# Patient Record
Sex: Female | Born: 1945 | Race: White | Hispanic: No | Marital: Married | Smoking: Never smoker
Health system: Southern US, Community
[De-identification: ages and names within clinical notes are randomized; demographics above are authoritative.]

## PROBLEM LIST (undated history)

## (undated) DIAGNOSIS — M8580 Other specified disorders of bone density and structure, unspecified site: Secondary | ICD-10-CM

## (undated) DIAGNOSIS — C50919 Malignant neoplasm of unspecified site of unspecified female breast: Secondary | ICD-10-CM

## (undated) DIAGNOSIS — M1990 Unspecified osteoarthritis, unspecified site: Secondary | ICD-10-CM

## (undated) DIAGNOSIS — H35341 Macular cyst, hole, or pseudohole, right eye: Secondary | ICD-10-CM

## (undated) DIAGNOSIS — C4491 Basal cell carcinoma of skin, unspecified: Secondary | ICD-10-CM

## (undated) DIAGNOSIS — E119 Type 2 diabetes mellitus without complications: Secondary | ICD-10-CM

## (undated) DIAGNOSIS — G4730 Sleep apnea, unspecified: Secondary | ICD-10-CM

## (undated) DIAGNOSIS — Z923 Personal history of irradiation: Secondary | ICD-10-CM

## (undated) DIAGNOSIS — I10 Essential (primary) hypertension: Secondary | ICD-10-CM

## (undated) DIAGNOSIS — E785 Hyperlipidemia, unspecified: Secondary | ICD-10-CM

## (undated) DIAGNOSIS — G4733 Obstructive sleep apnea (adult) (pediatric): Secondary | ICD-10-CM

## (undated) DIAGNOSIS — T7840XA Allergy, unspecified, initial encounter: Secondary | ICD-10-CM

## (undated) DIAGNOSIS — H269 Unspecified cataract: Secondary | ICD-10-CM

## (undated) DIAGNOSIS — I2510 Atherosclerotic heart disease of native coronary artery without angina pectoris: Secondary | ICD-10-CM

## (undated) HISTORY — DX: Sleep apnea, unspecified: G47.30

## (undated) HISTORY — DX: Essential (primary) hypertension: I10

## (undated) HISTORY — DX: Basal cell carcinoma of skin, unspecified: C44.91

## (undated) HISTORY — PX: COLONOSCOPY: SHX174

## (undated) HISTORY — DX: Hyperlipidemia, unspecified: E78.5

## (undated) HISTORY — PX: MOHS SURGERY: SUR867

## (undated) HISTORY — DX: Obstructive sleep apnea (adult) (pediatric): G47.33

## (undated) HISTORY — PX: SKIN CANCER EXCISION: SHX779

## (undated) HISTORY — DX: Malignant neoplasm of unspecified site of unspecified female breast: C50.919

## (undated) HISTORY — PX: TONSILLECTOMY: SUR1361

## (undated) HISTORY — PX: OTHER SURGICAL HISTORY: SHX169

## (undated) HISTORY — PX: POLYPECTOMY: SHX149

## (undated) HISTORY — DX: Unspecified cataract: H26.9

## (undated) HISTORY — DX: Atherosclerotic heart disease of native coronary artery without angina pectoris: I25.10

## (undated) HISTORY — DX: Allergy, unspecified, initial encounter: T78.40XA

## (undated) HISTORY — DX: Type 2 diabetes mellitus without complications: E11.9

## (undated) HISTORY — DX: Other specified disorders of bone density and structure, unspecified site: M85.80

## (undated) HISTORY — PX: BREAST LUMPECTOMY: SHX2

## (undated) HISTORY — PX: BREAST EXCISIONAL BIOPSY: SUR124

## (undated) HISTORY — DX: Unspecified osteoarthritis, unspecified site: M19.90

---

## 1994-02-21 DIAGNOSIS — C4491 Basal cell carcinoma of skin, unspecified: Secondary | ICD-10-CM

## 1994-02-21 HISTORY — DX: Basal cell carcinoma of skin, unspecified: C44.91

## 1994-08-31 DIAGNOSIS — C4491 Basal cell carcinoma of skin, unspecified: Secondary | ICD-10-CM

## 1994-08-31 HISTORY — DX: Basal cell carcinoma of skin, unspecified: C44.91

## 2003-01-19 ENCOUNTER — Other Ambulatory Visit: Admission: RE | Admit: 2003-01-19 | Discharge: 2003-01-19 | Payer: Self-pay | Admitting: Family Medicine

## 2003-05-04 ENCOUNTER — Inpatient Hospital Stay (HOSPITAL_COMMUNITY): Admission: EM | Admit: 2003-05-04 | Discharge: 2003-05-05 | Payer: Self-pay | Admitting: Emergency Medicine

## 2003-05-04 ENCOUNTER — Encounter: Payer: Self-pay | Admitting: Emergency Medicine

## 2004-03-09 ENCOUNTER — Other Ambulatory Visit: Admission: RE | Admit: 2004-03-09 | Discharge: 2004-03-09 | Payer: Self-pay | Admitting: Family Medicine

## 2004-03-21 ENCOUNTER — Ambulatory Visit (HOSPITAL_COMMUNITY): Admission: RE | Admit: 2004-03-21 | Discharge: 2004-03-21 | Payer: Self-pay | Admitting: Family Medicine

## 2004-12-27 ENCOUNTER — Encounter: Payer: Self-pay | Admitting: Internal Medicine

## 2005-03-19 ENCOUNTER — Other Ambulatory Visit: Admission: RE | Admit: 2005-03-19 | Discharge: 2005-03-19 | Payer: Self-pay | Admitting: Family Medicine

## 2005-03-29 ENCOUNTER — Ambulatory Visit (HOSPITAL_COMMUNITY): Admission: RE | Admit: 2005-03-29 | Discharge: 2005-03-29 | Payer: Self-pay | Admitting: Family Medicine

## 2006-04-29 ENCOUNTER — Ambulatory Visit (HOSPITAL_COMMUNITY): Admission: RE | Admit: 2006-04-29 | Discharge: 2006-04-29 | Payer: Self-pay | Admitting: Family Medicine

## 2006-06-20 ENCOUNTER — Other Ambulatory Visit: Admission: RE | Admit: 2006-06-20 | Discharge: 2006-06-20 | Payer: Self-pay | Admitting: Family Medicine

## 2006-06-20 ENCOUNTER — Encounter: Payer: Self-pay | Admitting: Internal Medicine

## 2006-07-22 ENCOUNTER — Encounter: Payer: Self-pay | Admitting: Internal Medicine

## 2007-05-01 ENCOUNTER — Ambulatory Visit (HOSPITAL_COMMUNITY): Admission: RE | Admit: 2007-05-01 | Discharge: 2007-05-01 | Payer: Self-pay | Admitting: Family Medicine

## 2007-05-05 ENCOUNTER — Encounter: Admission: RE | Admit: 2007-05-05 | Discharge: 2007-05-05 | Payer: Self-pay | Admitting: Family Medicine

## 2007-07-09 ENCOUNTER — Ambulatory Visit: Payer: Self-pay | Admitting: Internal Medicine

## 2007-07-09 DIAGNOSIS — M949 Disorder of cartilage, unspecified: Secondary | ICD-10-CM

## 2007-07-09 DIAGNOSIS — E785 Hyperlipidemia, unspecified: Secondary | ICD-10-CM | POA: Insufficient documentation

## 2007-07-09 DIAGNOSIS — E559 Vitamin D deficiency, unspecified: Secondary | ICD-10-CM | POA: Insufficient documentation

## 2007-07-09 DIAGNOSIS — M899 Disorder of bone, unspecified: Secondary | ICD-10-CM | POA: Insufficient documentation

## 2007-08-18 ENCOUNTER — Encounter: Payer: Self-pay | Admitting: Internal Medicine

## 2007-08-18 ENCOUNTER — Ambulatory Visit: Payer: Self-pay | Admitting: Internal Medicine

## 2007-09-11 DIAGNOSIS — C50919 Malignant neoplasm of unspecified site of unspecified female breast: Secondary | ICD-10-CM

## 2007-09-11 HISTORY — DX: Malignant neoplasm of unspecified site of unspecified female breast: C50.919

## 2007-10-03 ENCOUNTER — Ambulatory Visit: Payer: Self-pay | Admitting: Internal Medicine

## 2007-12-29 ENCOUNTER — Ambulatory Visit: Payer: Self-pay | Admitting: Internal Medicine

## 2007-12-29 LAB — CONVERTED CEMR LAB
Albumin: 3.8 g/dL (ref 3.5–5.2)
Alkaline Phosphatase: 75 units/L (ref 39–117)
BUN: 17 mg/dL (ref 6–23)
Basophils Absolute: 0 10*3/uL (ref 0.0–0.1)
Blood in Urine, dipstick: NEGATIVE
Cholesterol: 112 mg/dL (ref 0–200)
Eosinophils Absolute: 0.1 10*3/uL (ref 0.0–0.7)
Eosinophils Relative: 1.9 % (ref 0.0–5.0)
GFR calc Af Amer: 109 mL/min
GFR calc non Af Amer: 90 mL/min
Glucose, Urine, Semiquant: NEGATIVE
HCT: 39.4 % (ref 36.0–46.0)
HDL: 27 mg/dL — ABNORMAL LOW (ref >39.0)
Ketones, urine, test strip: NEGATIVE
MCHC: 33.8 g/dL (ref 30.0–36.0)
MCV: 93.9 fL (ref 78.0–100.0)
Monocytes Absolute: 0.6 10*3/uL (ref 0.1–1.0)
Platelets: 202 10*3/uL (ref 150–400)
Potassium: 3.8 meq/L (ref 3.5–5.1)
Protein, U semiquant: NEGATIVE
RDW: 11.5 % (ref 11.5–14.6)
Specific Gravity, Urine: 1.02
TSH: 1.27 microintl units/mL (ref 0.35–5.50)
Total Bilirubin: 0.5 mg/dL (ref 0.3–1.2)
Triglycerides: 82 mg/dL (ref 0–149)
VLDL: 16 mg/dL (ref 0–40)
WBC: 5.6 10*3/uL (ref 4.5–10.5)
pH: 7

## 2008-01-05 ENCOUNTER — Encounter: Payer: Self-pay | Admitting: Internal Medicine

## 2008-01-05 ENCOUNTER — Other Ambulatory Visit: Admission: RE | Admit: 2008-01-05 | Discharge: 2008-01-05 | Payer: Self-pay | Admitting: Internal Medicine

## 2008-01-05 ENCOUNTER — Ambulatory Visit: Payer: Self-pay | Admitting: Internal Medicine

## 2008-01-28 ENCOUNTER — Ambulatory Visit: Payer: Self-pay | Admitting: Gastroenterology

## 2008-02-13 ENCOUNTER — Encounter: Payer: Self-pay | Admitting: Gastroenterology

## 2008-02-13 ENCOUNTER — Ambulatory Visit: Payer: Self-pay | Admitting: Gastroenterology

## 2008-02-16 ENCOUNTER — Encounter: Payer: Self-pay | Admitting: Gastroenterology

## 2008-03-01 ENCOUNTER — Telehealth: Payer: Self-pay | Admitting: Gastroenterology

## 2008-03-01 ENCOUNTER — Ambulatory Visit: Payer: Self-pay | Admitting: Internal Medicine

## 2008-03-03 ENCOUNTER — Ambulatory Visit: Payer: Self-pay | Admitting: Gastroenterology

## 2008-03-04 ENCOUNTER — Telehealth: Payer: Self-pay | Admitting: Gastroenterology

## 2008-03-05 ENCOUNTER — Encounter: Payer: Self-pay | Admitting: Gastroenterology

## 2008-03-10 HISTORY — PX: MASS EXCISION: SHX2000

## 2008-03-17 ENCOUNTER — Encounter: Payer: Self-pay | Admitting: Gastroenterology

## 2008-03-22 ENCOUNTER — Encounter: Payer: Self-pay | Admitting: Internal Medicine

## 2008-03-22 ENCOUNTER — Ambulatory Visit (HOSPITAL_COMMUNITY): Admission: RE | Admit: 2008-03-22 | Discharge: 2008-03-24 | Payer: Self-pay | Admitting: General Surgery

## 2008-03-22 ENCOUNTER — Encounter (HOSPITAL_BASED_OUTPATIENT_CLINIC_OR_DEPARTMENT_OTHER): Payer: Self-pay | Admitting: General Surgery

## 2008-04-12 ENCOUNTER — Ambulatory Visit: Payer: Self-pay | Admitting: Internal Medicine

## 2008-04-12 LAB — CONVERTED CEMR LAB
ALT: 28 units/L (ref 0–35)
AST: 20 units/L (ref 0–37)
Albumin: 4 g/dL (ref 3.5–5.2)
HDL: 29.3 mg/dL — ABNORMAL LOW (ref >39.0)
Total CHOL/HDL Ratio: 5.3
Triglycerides: 104 mg/dL (ref 0–149)

## 2008-05-11 ENCOUNTER — Ambulatory Visit (HOSPITAL_COMMUNITY): Admission: RE | Admit: 2008-05-11 | Discharge: 2008-05-11 | Payer: Self-pay | Admitting: Internal Medicine

## 2008-05-18 ENCOUNTER — Encounter: Admission: RE | Admit: 2008-05-18 | Discharge: 2008-05-18 | Payer: Self-pay | Admitting: Internal Medicine

## 2008-06-10 ENCOUNTER — Encounter (INDEPENDENT_AMBULATORY_CARE_PROVIDER_SITE_OTHER): Payer: Self-pay | Admitting: Diagnostic Radiology

## 2008-06-10 ENCOUNTER — Encounter: Payer: Self-pay | Admitting: Internal Medicine

## 2008-06-10 ENCOUNTER — Encounter: Admission: RE | Admit: 2008-06-10 | Discharge: 2008-06-10 | Payer: Self-pay | Admitting: Internal Medicine

## 2008-06-10 HISTORY — PX: BREAST LUMPECTOMY: SHX2

## 2008-06-15 ENCOUNTER — Telehealth: Payer: Self-pay | Admitting: Internal Medicine

## 2008-06-21 ENCOUNTER — Encounter: Payer: Self-pay | Admitting: Gastroenterology

## 2008-06-21 ENCOUNTER — Encounter: Admission: RE | Admit: 2008-06-21 | Discharge: 2008-06-21 | Payer: Self-pay | Admitting: Internal Medicine

## 2008-07-06 ENCOUNTER — Encounter: Admission: RE | Admit: 2008-07-06 | Discharge: 2008-07-06 | Payer: Self-pay | Admitting: General Surgery

## 2008-07-06 ENCOUNTER — Encounter (INDEPENDENT_AMBULATORY_CARE_PROVIDER_SITE_OTHER): Payer: Self-pay | Admitting: General Surgery

## 2008-07-06 ENCOUNTER — Ambulatory Visit (HOSPITAL_BASED_OUTPATIENT_CLINIC_OR_DEPARTMENT_OTHER): Admission: RE | Admit: 2008-07-06 | Discharge: 2008-07-06 | Payer: Self-pay | Admitting: General Surgery

## 2008-07-06 ENCOUNTER — Encounter: Payer: Self-pay | Admitting: Internal Medicine

## 2008-07-07 ENCOUNTER — Encounter (INDEPENDENT_AMBULATORY_CARE_PROVIDER_SITE_OTHER): Payer: Self-pay | Admitting: General Surgery

## 2008-07-07 ENCOUNTER — Encounter: Payer: Self-pay | Admitting: Internal Medicine

## 2008-07-08 ENCOUNTER — Telehealth: Payer: Self-pay | Admitting: Internal Medicine

## 2008-07-13 ENCOUNTER — Ambulatory Visit: Payer: Self-pay | Admitting: Internal Medicine

## 2008-07-13 DIAGNOSIS — I10 Essential (primary) hypertension: Secondary | ICD-10-CM | POA: Insufficient documentation

## 2008-07-28 ENCOUNTER — Encounter: Payer: Self-pay | Admitting: Internal Medicine

## 2008-07-31 ENCOUNTER — Encounter: Payer: Self-pay | Admitting: Internal Medicine

## 2008-08-09 ENCOUNTER — Encounter: Payer: Self-pay | Admitting: Internal Medicine

## 2008-08-11 ENCOUNTER — Ambulatory Visit: Payer: Self-pay | Admitting: Internal Medicine

## 2008-08-25 ENCOUNTER — Encounter: Payer: Self-pay | Admitting: Internal Medicine

## 2008-09-06 ENCOUNTER — Ambulatory Visit: Admission: RE | Admit: 2008-09-06 | Discharge: 2008-11-04 | Payer: Self-pay | Admitting: Radiation Oncology

## 2008-09-21 ENCOUNTER — Encounter: Payer: Self-pay | Admitting: Internal Medicine

## 2008-10-01 ENCOUNTER — Telehealth: Payer: Self-pay | Admitting: Internal Medicine

## 2008-11-05 ENCOUNTER — Encounter: Payer: Self-pay | Admitting: Internal Medicine

## 2008-11-17 ENCOUNTER — Ambulatory Visit: Payer: Self-pay | Admitting: Internal Medicine

## 2008-12-03 ENCOUNTER — Encounter: Payer: Self-pay | Admitting: Internal Medicine

## 2008-12-09 ENCOUNTER — Encounter: Payer: Self-pay | Admitting: Internal Medicine

## 2008-12-13 ENCOUNTER — Ambulatory Visit: Payer: Self-pay | Admitting: Internal Medicine

## 2008-12-28 ENCOUNTER — Encounter: Payer: Self-pay | Admitting: Internal Medicine

## 2008-12-29 ENCOUNTER — Ambulatory Visit: Payer: Self-pay | Admitting: Internal Medicine

## 2009-01-12 ENCOUNTER — Ambulatory Visit: Payer: Self-pay | Admitting: Internal Medicine

## 2009-01-13 LAB — CONVERTED CEMR LAB
ALT: 20 units/L (ref 0–35)
Alkaline Phosphatase: 46 units/L (ref 39–117)
BUN: 15 mg/dL (ref 6–23)
Bilirubin, Direct: 0.1 mg/dL (ref 0.0–0.3)
Calcium: 9.1 mg/dL (ref 8.4–10.5)
Cholesterol: 147 mg/dL (ref 0–200)
Creatinine, Ser: 0.7 mg/dL (ref 0.4–1.2)
GFR calc non Af Amer: 90.1 mL/min (ref >60)
Total Protein: 6.5 g/dL (ref 6.0–8.3)

## 2009-02-10 ENCOUNTER — Encounter: Payer: Self-pay | Admitting: Internal Medicine

## 2009-03-28 ENCOUNTER — Ambulatory Visit: Payer: Self-pay | Admitting: Internal Medicine

## 2009-03-28 LAB — CONVERTED CEMR LAB
AST: 23 units/L (ref 0–37)
Albumin: 3.7 g/dL (ref 3.5–5.2)
BUN: 16 mg/dL (ref 6–23)
Basophils Absolute: 0 10*3/uL (ref 0.0–0.1)
Bilirubin Urine: NEGATIVE
CO2: 31 meq/L (ref 19–32)
Chloride: 108 meq/L (ref 96–112)
Cholesterol: 116 mg/dL (ref 0–200)
Eosinophils Absolute: 0.2 10*3/uL (ref 0.0–0.7)
GFR calc non Af Amer: 107.57 mL/min (ref >60)
Glucose, Bld: 93 mg/dL (ref 70–99)
Glucose, Urine, Semiquant: NEGATIVE
HCT: 39.9 % (ref 36.0–46.0)
Hemoglobin: 14 g/dL (ref 12.0–15.0)
Lymphs Abs: 1.5 10*3/uL (ref 0.7–4.0)
MCHC: 35 g/dL (ref 30.0–36.0)
MCV: 94.2 fL (ref 78.0–100.0)
Monocytes Absolute: 0.6 10*3/uL (ref 0.1–1.0)
Monocytes Relative: 10.2 % (ref 3.0–12.0)
Neutro Abs: 3.2 10*3/uL (ref 1.4–7.7)
Platelets: 188 10*3/uL (ref 150.0–400.0)
Potassium: 4.7 meq/L (ref 3.5–5.1)
RDW: 11.3 % — ABNORMAL LOW (ref 11.5–14.6)
Sodium: 143 meq/L (ref 135–145)
Specific Gravity, Urine: 1.015
TSH: 1.1 microintl units/mL (ref 0.35–5.50)
Total Bilirubin: 0.7 mg/dL (ref 0.3–1.2)
VLDL: 18.2 mg/dL (ref 0.0–40.0)
pH: 7.5

## 2009-04-05 ENCOUNTER — Encounter: Payer: Self-pay | Admitting: Internal Medicine

## 2009-04-05 ENCOUNTER — Other Ambulatory Visit: Admission: RE | Admit: 2009-04-05 | Discharge: 2009-04-05 | Payer: Self-pay | Admitting: Internal Medicine

## 2009-04-05 ENCOUNTER — Ambulatory Visit: Payer: Self-pay | Admitting: Internal Medicine

## 2009-05-12 ENCOUNTER — Encounter: Admission: RE | Admit: 2009-05-12 | Discharge: 2009-05-12 | Payer: Self-pay | Admitting: Hematology and Oncology

## 2009-09-14 ENCOUNTER — Ambulatory Visit: Payer: Self-pay | Admitting: Internal Medicine

## 2009-09-14 LAB — CONVERTED CEMR LAB
AST: 24 units/L (ref 0–37)
Albumin: 3.7 g/dL (ref 3.5–5.2)
Alkaline Phosphatase: 41 units/L (ref 39–117)
BUN: 13 mg/dL (ref 6–23)
CO2: 30 meq/L (ref 19–32)
Calcium: 9.4 mg/dL (ref 8.4–10.5)
Cholesterol: 157 mg/dL (ref 0–200)
Glucose, Bld: 90 mg/dL (ref 70–99)
HDL: 32.1 mg/dL — ABNORMAL LOW (ref >39.00)
Sodium: 142 meq/L (ref 135–145)
Total Protein: 6.6 g/dL (ref 6.0–8.3)
VLDL: 23.8 mg/dL (ref 0.0–40.0)

## 2009-10-03 ENCOUNTER — Other Ambulatory Visit: Admission: RE | Admit: 2009-10-03 | Discharge: 2009-10-03 | Payer: Self-pay | Admitting: Internal Medicine

## 2009-10-03 ENCOUNTER — Ambulatory Visit: Payer: Self-pay | Admitting: Internal Medicine

## 2009-10-03 DIAGNOSIS — R87619 Unspecified abnormal cytological findings in specimens from cervix uteri: Secondary | ICD-10-CM | POA: Insufficient documentation

## 2009-10-03 LAB — HM PAP SMEAR

## 2009-10-06 LAB — CONVERTED CEMR LAB: Pap Smear: NEGATIVE

## 2009-10-12 ENCOUNTER — Ambulatory Visit: Payer: Self-pay | Admitting: Internal Medicine

## 2009-10-12 ENCOUNTER — Encounter: Payer: Self-pay | Admitting: Internal Medicine

## 2009-10-25 IMAGING — MG MM DIGITAL SCREENING BILAT
4 series · 4 of 4 positions shown · non-contrast
Comparison: none

DG SCREEN MAMMOGRAM BILATERAL
Bilateral CC and MLO view(s) were taken.
Technologist: Jidung, Jassi.(JALIA)(M)

DIGITAL SCREENING MAMMOGRAM WITH CAD:
The breast tissue is heterogeneously dense.  A possible mass is noted in the left breast.  Spot 
compression views and possibly sonography are recommended for further evaluation.  In the right 
breast, no masses or malignant type calcifications are identified.  Compared with prior studies.

[R CC]
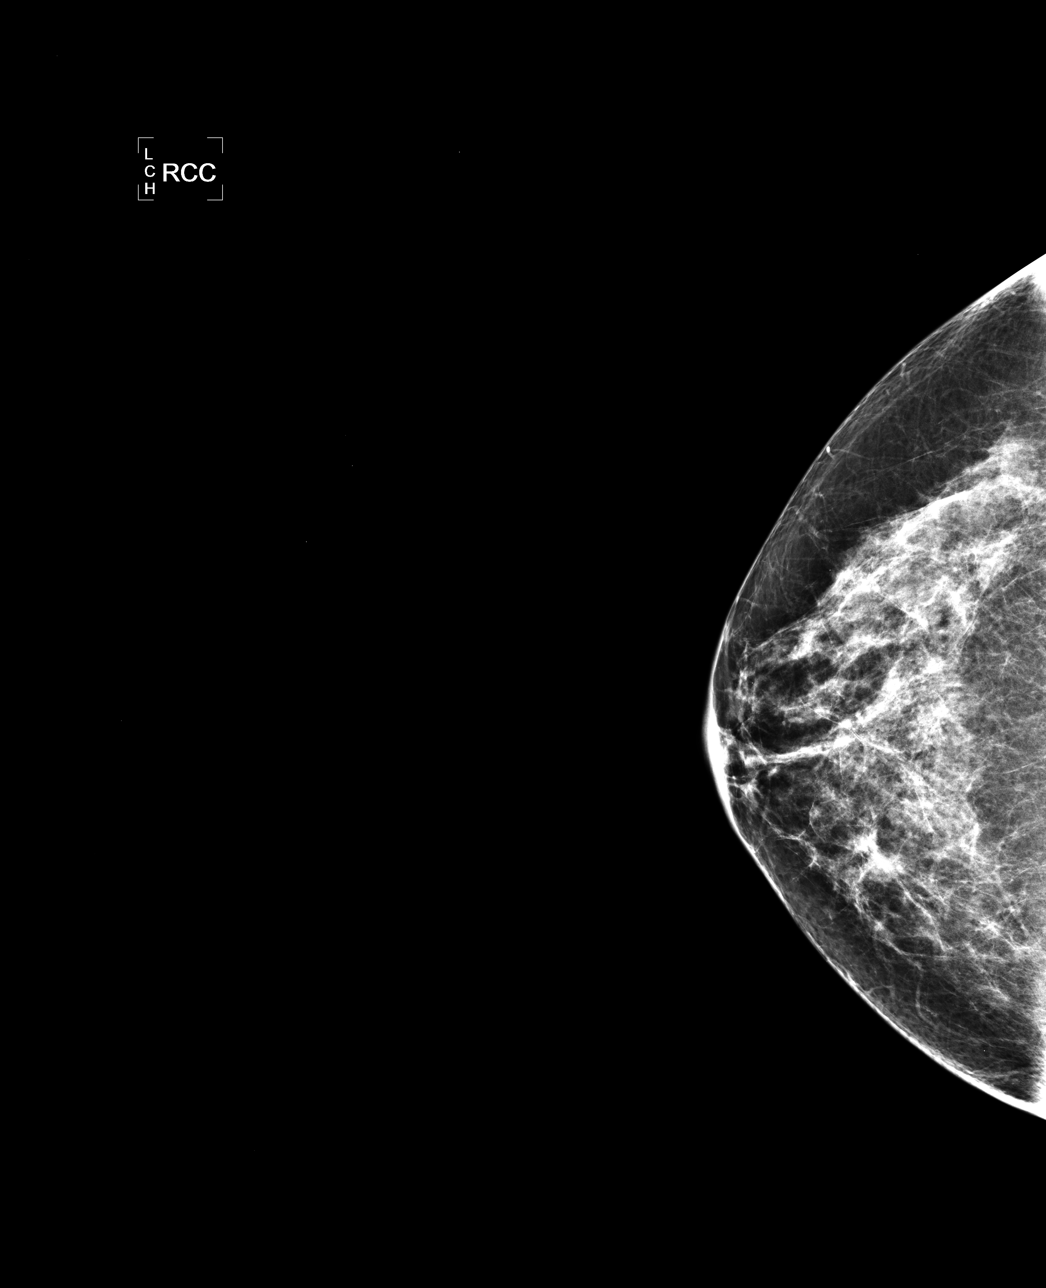

[R MLO]
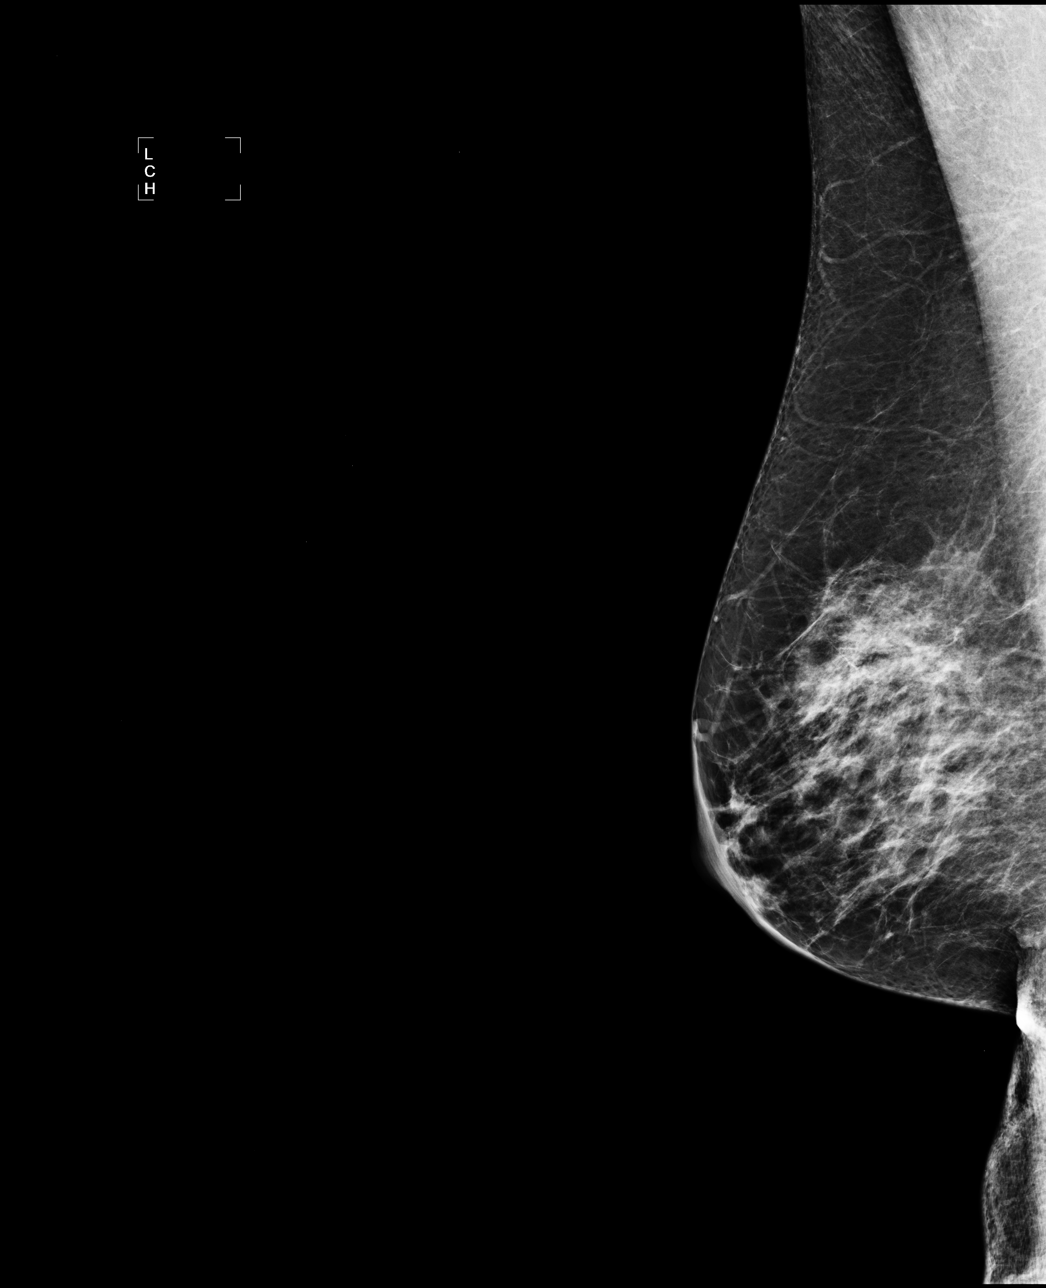

[L CC]
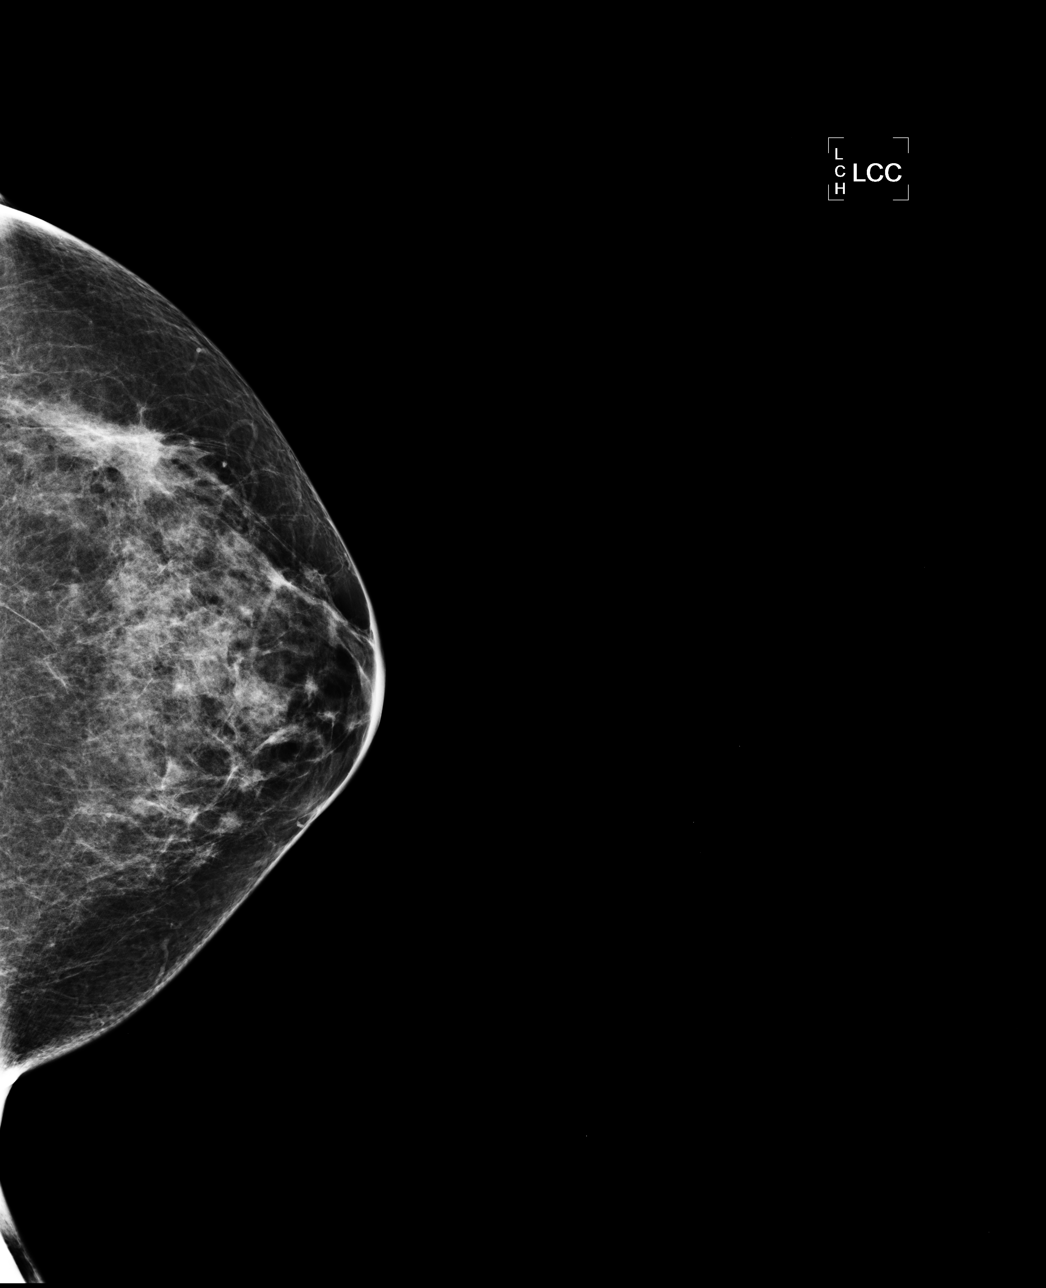

[L MLO]
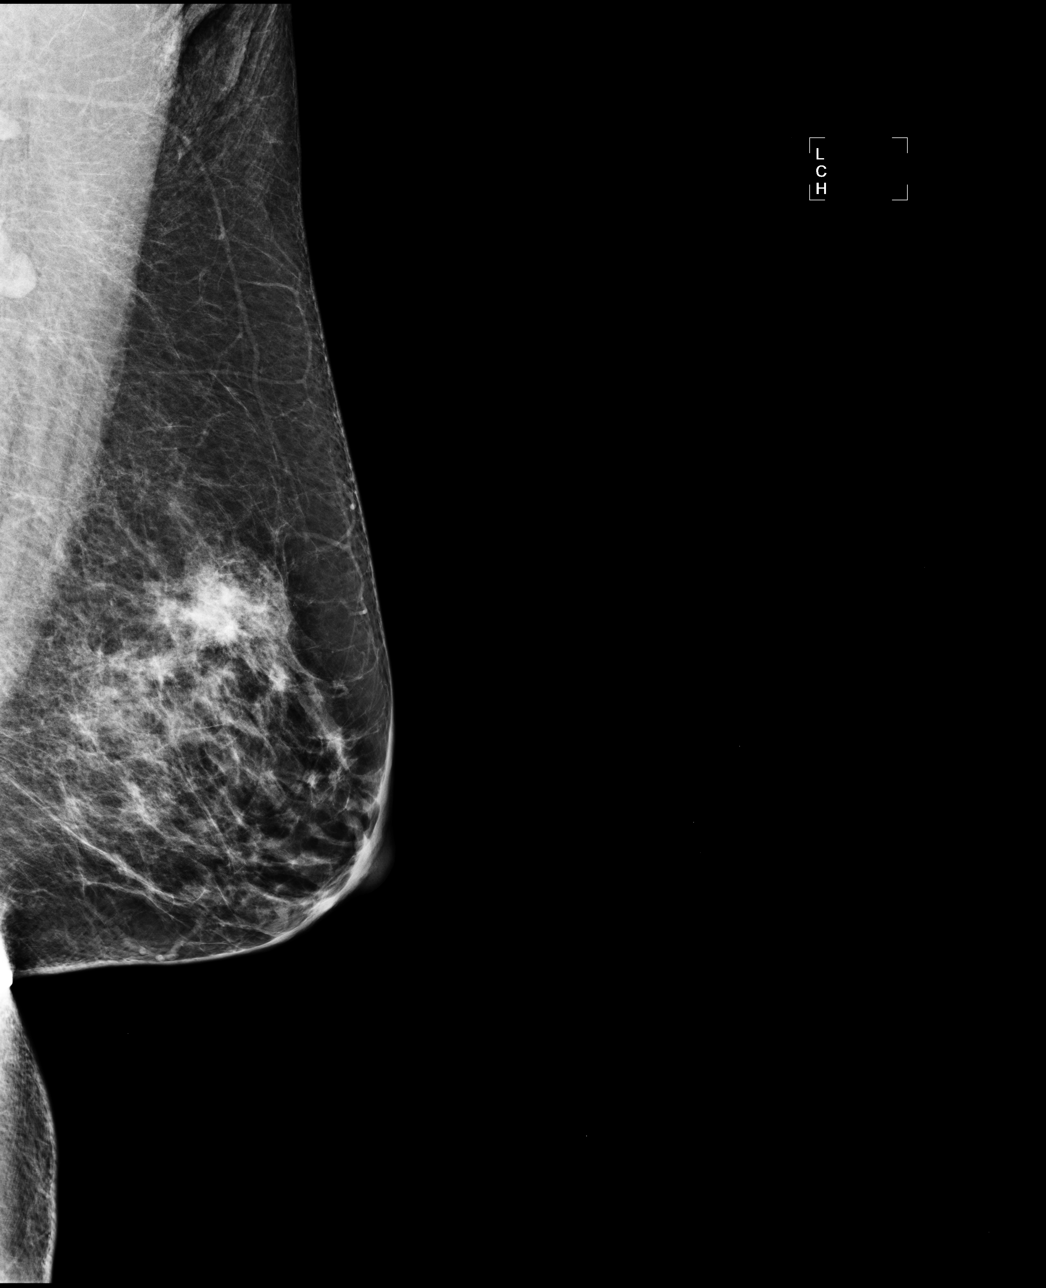

[4 of 4 positions shown; findings below may reference images not displayed]

IMPRESSION: Possible mass, left breast.  Additional evaluation is indicated.  The patient will be contacted for
additional studies and a supplementary report will follow.  No specific mammographic evidence of 
malignancy, right breast.

ASSESSMENT: Need additional imaging evaluation and/or prior mammograms for comparison - BI-RADS 0

Further imaging of the left breast.
ANALYZED BY COMPUTER AIDED DETECTION. , THIS PROCEDURE WAS A DIGITAL MAMMOGRAM.

## 2009-11-01 IMAGING — US UNKNOWN PR STUDY
1 series · 5 of 5 positions shown · non-contrast
Comparison: Prior mammograms

CLINICAL DATA: Possible mass left breast seen on recent screening
mammogram

DIGITAL DIAGNOSTIC  LEFT  MAMMOGRAM  WITHOUT CAD AND LEFT BREAST
ULTRASOUND:

[Series 1: unknown pr study · 5 of 5 slices shown]
[im 1/5]
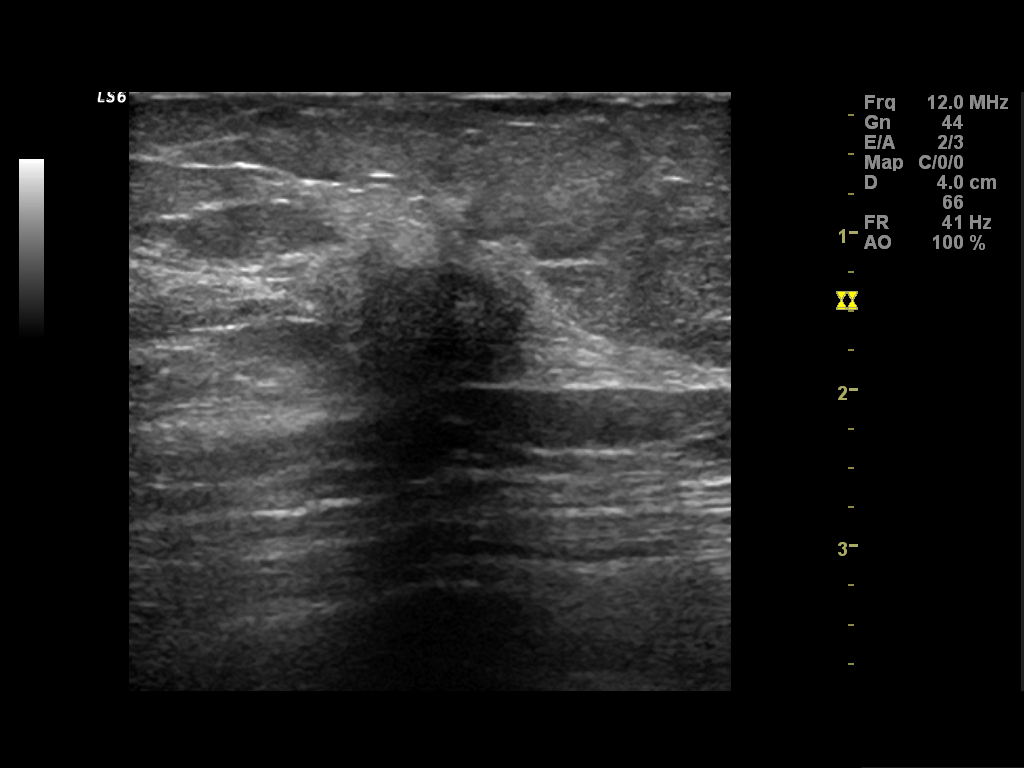
[im 2/5]
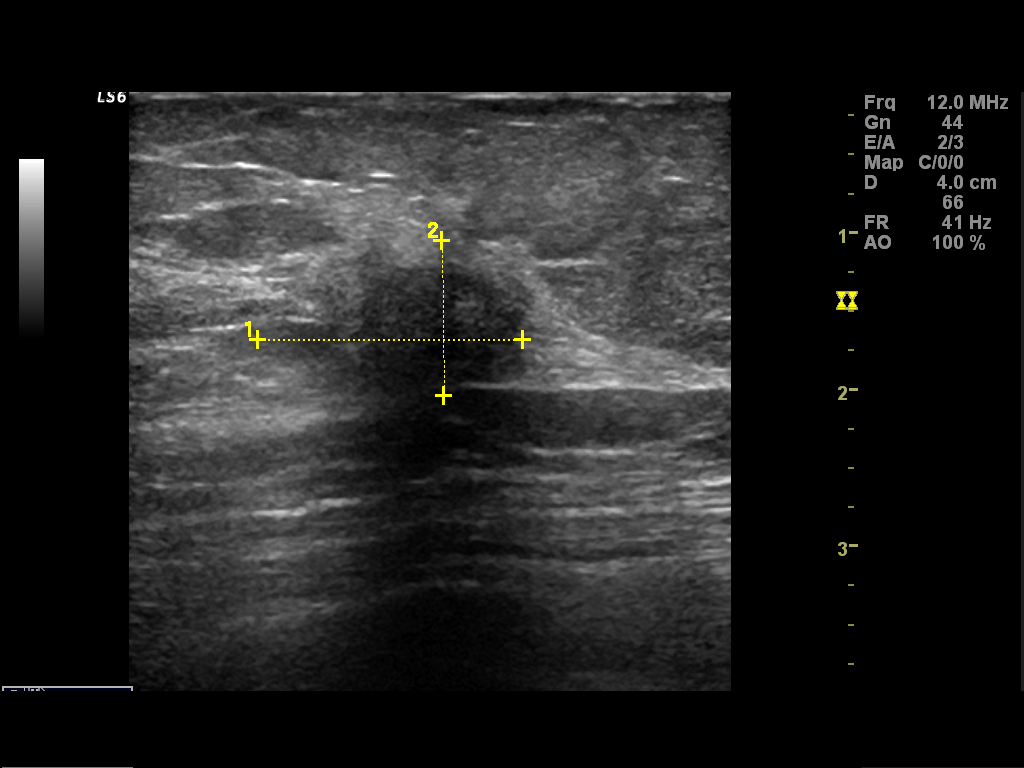
[im 3/5]
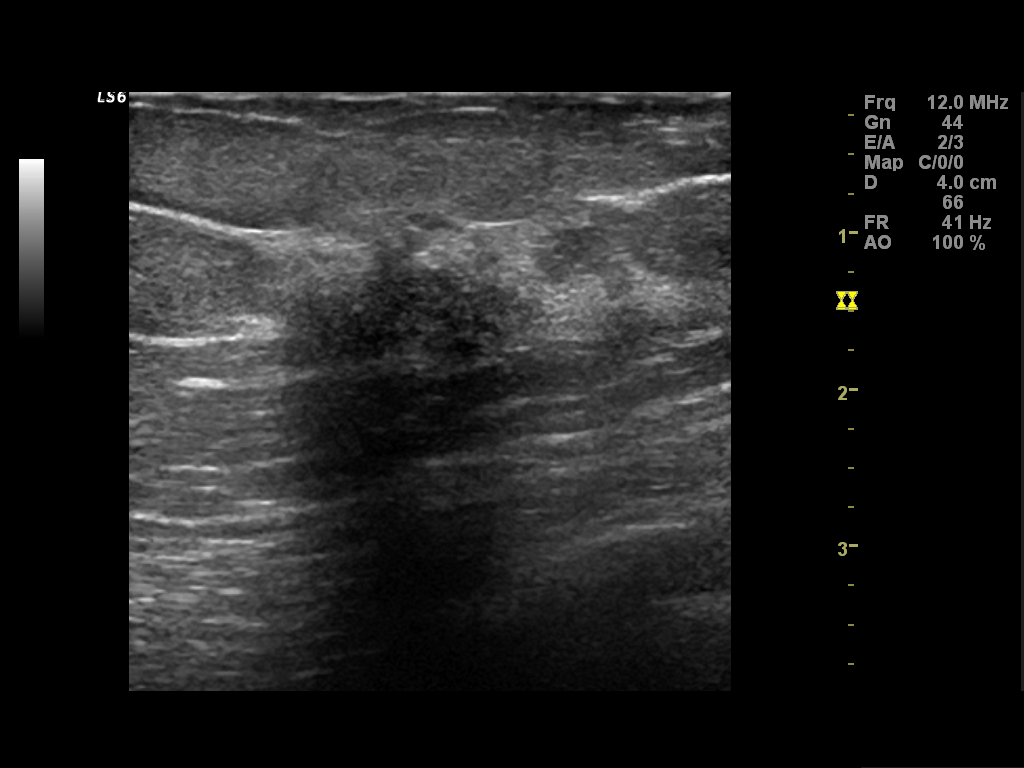
[im 4/5]
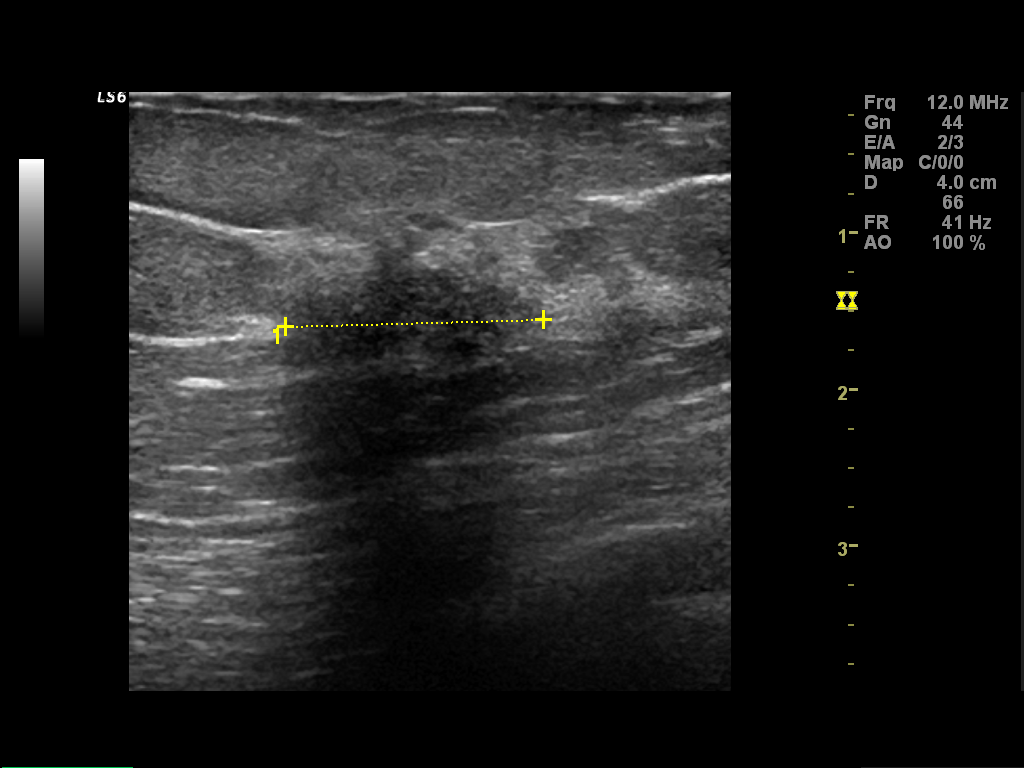
[im 5/5]
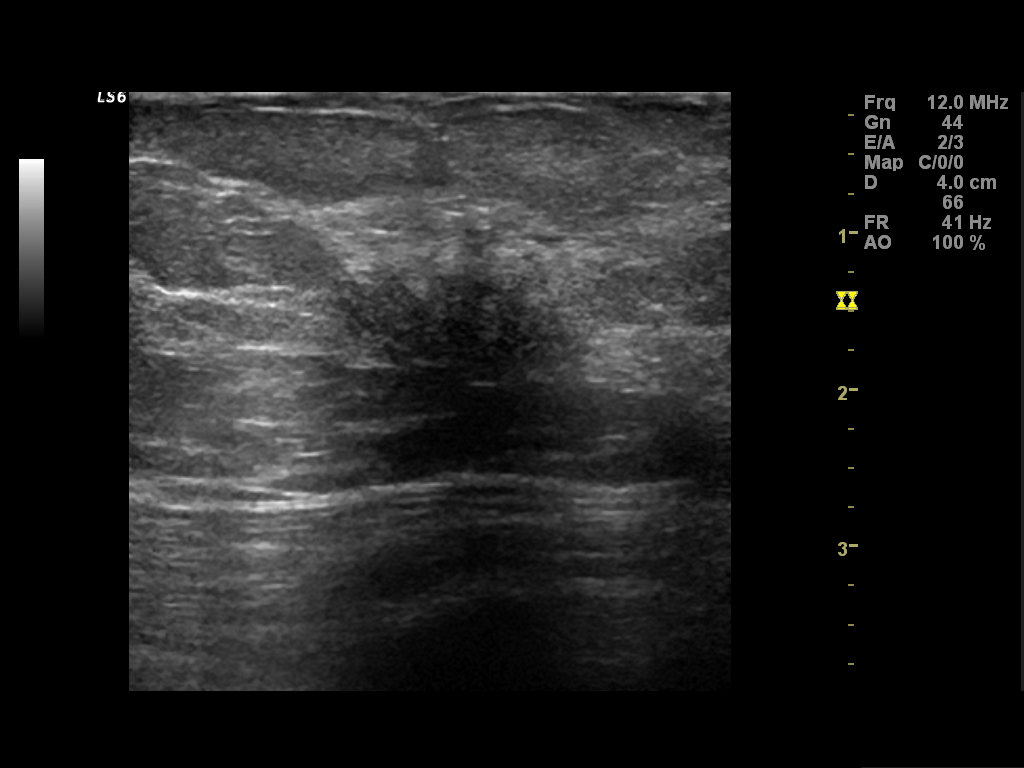

[5 of 5 positions shown; findings below may reference images not displayed]

FINDINGS: Focal spot compression views of the upper outer left
breast show heterogeneously dense breast parenchyma.  There is an
area of added density and architectural distortion in the deep
aspect of the upper outer quadrant.

On physical exam, I do palpate a firm area in the left breast at
[DATE] position 3 cm from the nipple.

Ultrasound is performed, showing an irregular hypoechoic 1.7 x
by 1.6 cm area with ill-defined margins and posterior acoustic
shadowing in the left breast at [DATE] position 3 cm from the nipple.
Ultrasound of the left axilla shows no evidence of adenopathy.
IMPRESSION: Ill-defined hypoechoic area with posterior acoustic shadowing in
the left breast at [DATE] position 3 cm from the nipple.  This does
correspond to a palpable area of firmness on physical examination
Tissue sampling for pathologic correlation is suggested.  The
patient would like to schedule ultrasound-guided core needle biopsy
after she returns from a planned trip.  This has been scheduled for
June 10, 2008 at 10 o'clock.

BI-RADS CATEGORY 4:  Suspicious abnormality - biopsy should be
considered.

## 2009-11-07 ENCOUNTER — Encounter: Payer: Self-pay | Admitting: Internal Medicine

## 2009-11-15 ENCOUNTER — Telehealth: Payer: Self-pay | Admitting: Internal Medicine

## 2010-02-21 ENCOUNTER — Encounter: Payer: Self-pay | Admitting: Internal Medicine

## 2010-04-12 ENCOUNTER — Ambulatory Visit: Payer: Self-pay | Admitting: Internal Medicine

## 2010-04-12 LAB — CONVERTED CEMR LAB
ALT: 21 units/L (ref 0–35)
AST: 22 units/L (ref 0–37)
Alkaline Phosphatase: 50 units/L (ref 39–117)
BUN: 20 mg/dL (ref 6–23)
CO2: 28 meq/L (ref 19–32)
Calcium: 8.6 mg/dL (ref 8.4–10.5)
Creatinine, Ser: 0.6 mg/dL (ref 0.4–1.2)
Glucose, Bld: 92 mg/dL (ref 70–99)
Total Bilirubin: 0.5 mg/dL (ref 0.3–1.2)
Total CHOL/HDL Ratio: 4
Triglycerides: 115 mg/dL (ref 0.0–149.0)

## 2010-04-19 ENCOUNTER — Ambulatory Visit: Payer: Self-pay | Admitting: Internal Medicine

## 2010-05-17 ENCOUNTER — Encounter: Admission: RE | Admit: 2010-05-17 | Discharge: 2010-05-17 | Payer: Self-pay | Admitting: Hematology and Oncology

## 2010-05-17 LAB — HM MAMMOGRAPHY

## 2010-06-02 ENCOUNTER — Encounter: Payer: Self-pay | Admitting: Internal Medicine

## 2010-08-24 ENCOUNTER — Encounter: Payer: Self-pay | Admitting: Internal Medicine

## 2010-08-25 ENCOUNTER — Telehealth: Payer: Self-pay | Admitting: Internal Medicine

## 2010-09-08 ENCOUNTER — Encounter
Admission: RE | Admit: 2010-09-08 | Discharge: 2010-09-08 | Payer: Self-pay | Source: Home / Self Care | Attending: General Surgery | Admitting: General Surgery

## 2010-09-10 HISTORY — PX: CATARACT EXTRACTION: SUR2

## 2010-09-26 ENCOUNTER — Ambulatory Visit
Admission: RE | Admit: 2010-09-26 | Discharge: 2010-09-26 | Payer: Self-pay | Source: Home / Self Care | Attending: Internal Medicine | Admitting: Internal Medicine

## 2010-10-01 ENCOUNTER — Encounter: Payer: Self-pay | Admitting: Family Medicine

## 2010-10-10 ENCOUNTER — Encounter: Payer: Self-pay | Admitting: Internal Medicine

## 2010-10-10 NOTE — Letter (Signed)
Summary: [facility]   Imported By: Maryln Gottron 03/31/2010 11:11:41  _____________________________________________________________________  External Attachment:    Type:   Image     Comment:   External Document

## 2010-10-10 NOTE — Assessment & Plan Note (Signed)
Summary: 6 month rov/et pt rsc/njr   Vital Signs:  Patient profile:   65 year old female Menstrual status:  postmenopausal Weight:      158 pounds BMI:     25.59 Temp:     98.6 degrees F oral Pulse rate:   74 / minute Pulse rhythm:   regular Resp:     12 per minute BP sitting:   114 / 76  (left arm) Cuff size:   regular  Vitals Entered By: Gladis Riffle, RN (April 19, 2010 9:50 AM) CC: 6 month rov, labs done Is Patient Diabetic? No   Primary Care Miche Loughridge:  [name], MD  CC:  6 month rov and labs done.  History of Present Illness:  Follow-Up Visit      This is a 65 year old woman who presents for Follow-up visit.  The patient denies chest pain and palpitations.  Since the last visit the patient notes no new problems or concerns.  The patient reports taking meds as prescribed.  When questioned about possible medication side effects, the patient notes none.    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 Take 1 Tablet By Mouth At Bedtime As Directed 2)  Lisinopril 20 Mg Tabs (Lisinopril) .Marland Kitchen.. 1 Tablet By Mouth Daily 3)  Alprazolam 0.5 Mg  Tabs (Alprazolam) .... Take One Tablet By Mouth Every Other Day As Needed Anxiety 4)  Tamoxifen Citrate 20 Mg  Tabs (Tamoxifen Citrate) .... Take 1 Tablet By Mouth Once A Day 5)  Aspirin 81 Mg Tbec (Aspirin) .... Once Daily 6)  Multivitamins  Tabs (Multiple Vitamin) .... Once Daily 7)  Omega-3 350 Mg Caps (Omega-3 Fatty Acids) ....  One  Daily 8)  Vitamin C 500 Mg Tabs (Ascorbic Acid) .... Once Daily 9)  Vitamin D3 1000 Unit Tabs (Cholecalciferol) .... Every Day  Allergies (verified): No Known Drug Allergies  Past History:  Past Medical History: Last updated: 07/13/2008 Hyperlipidemia BCC- Basil cell carcinoma breast biopsy 1998 Osteopenia Breast CA 2009  Past Surgical History: Last updated: 07/09/2007 BCC-equired excision breast biopsy- 1998 Tonsillectomy  Family History: Last updated: 07/13/2008 father-heart disease MI 61yo Mother MI at age 86 Family History Hypertension-both parents Sister-suicide age 45 yo brother--no contact  Social History: Last updated: 03/03/2008 Retired-works one day weekly Married 1 out of 3 children-decased congenital heart (transposition of the great vessels) Never Smoked Alcohol use-yes- socially  Risk Factors: Alcohol Use: <1 (04/19/2010)  Risk Factors: Smoking Status: never (04/19/2010)  Physical Exam  General:  alert and well-developed.   Head:  normocephalic and atraumatic.   Eyes:  pupils equal and pupils round.   Ears:  R ear normal and L ear normal.   Neck:  No deformities, masses, or tenderness noted. Chest Wall:  No deformities, masses, or tenderness noted. Lungs:  normal respiratory effort and no intercostal retractions.   Heart:  normal rate and regular rhythm.   Abdomen:  Bowel sounds positive,abdomen soft and non-tender without masses, organomegaly or hernias noted. Msk:  normal ROM and no joint tenderness.   Neurologic:  cranial nerves II-XII intact and gait normal.   Psych:  good eye contact and not anxious appearing.     Impression & Recommendations:  Problem # 1:  HYPERTENSION (ICD-401.9) controlled continue current medications  Her updated medication list for this problem includes:    Lisinopril 20 Mg Tabs (Lisinopril) .Marland Kitchen... 1 tablet by mouth daily  BP today: 114/76 Prior BP: 150/98 (10/03/2009)  Labs Reviewed: K+: 4.0  (04/12/2010) Creat: : 0.6 (04/12/2010)   Chol: 149 (04/12/2010)   HDL: 33.80 (04/12/2010)   LDL: 92 (04/12/2010)   TG: 115.0 (04/12/2010)  Problem # 2:  HYPERLIPIDEMIA (ICD-272.4) controlled continue current medications  increase fish oil to 3 daily Her updated medication list for this problem includes:    Simvastatin 40 Mg Tabs (Simvastatin) .Marland Kitchen... Take 1 tablet by mouth at bedtime as directed  Labs Reviewed: SGOT: 22 (04/12/2010)   SGPT: 21 (04/12/2010)   HDL:33.80 (04/12/2010), 32.10 (09/14/2009)  LDL:92 (04/12/2010), 101 (16/06/9603)  Chol:149 (04/12/2010), 157 (09/14/2009)  Trig:115.0 (04/12/2010), 119.0 (09/14/2009)  Problem # 3:  ADENOCARCINOMA, BREAST (ICD-174.9) has f/u with oncology and surgery (total of 4/year)  Complete Medication List: 1)  Simvastatin 40 Mg Tabs (Simvastatin) .... Take 1 tablet by mouth at bedtime as directed 2)  Lisinopril 20 Mg Tabs (Lisinopril) .Marland Kitchen.. 1 tablet by mouth daily 3)  Alprazolam 0.5 Mg Tabs (Alprazolam) .... Take one tablet by mouth every other day as needed anxiety 4)  Tamoxifen Citrate 20 Mg Tabs (Tamoxifen citrate) .... Take 1 tablet by mouth once a day 5)  Aspirin 81 Mg Tbec (Aspirin) .... Once daily 6)  Multivitamins Tabs (Multiple vitamin) .... Once daily 7)  Omega-3 350 Mg Caps (Omega-3 fatty acids) .... 3 by mouth once daily 8)  Vitamin C 500 Mg Tabs (Ascorbic acid) .... Once daily 9)  Vitamin D3 1000 Unit Tabs (Cholecalciferol) .... Every day  Other Orders: Tdap => 23yrs IM (54098) Admin 1st Vaccine (11914)  Patient Instructions: 1)  Please schedule a follow-up appointment in 6 months. CPX   Immunizations Administered:  Tetanus Vaccine:    Vaccine Type: Tdap    Site: left deltoid    Mfr: GlaxoSmithKline    Dose: 0.5 ml    Route: IM    Given by: Gladis Riffle, RN    Exp. Date: 03/09/2012    Lot #: [id number]

## 2010-10-10 NOTE — Assessment & Plan Note (Signed)
Summary: 6 month rov/njr; repeat pap 6 mo/et rsc bmp/njr   Vital Signs:  Patient profile:   65 year old female Menstrual status:  postmenopausal Weight:      165 pounds Temp:     98.2 degrees F Pulse rate:   72 / minute Resp:     12 per minute BP sitting:   150 / 98  (right arm)  Vitals Entered By: Gladis Riffle, RN (October 03, 2009 9:45 AM)   Preventive Screening-Counseling & Management  Alcohol-Tobacco     Smoking Status: never  Allergies (verified): No Known Drug Allergies  Comments:  Nurse/Medical Assistant: 6 month with pap, labs done  The patient's medications and allergies were reviewed with the patient and were updated in the Medication and Allergy Lists. Gladis Riffle, RN (October 03, 2009 9:49 AM)  Physical Exam  General:  alert and well-developed.   Head:  normocephalic and atraumatic.   Eyes:  pupils equal and pupils round.   Ears:  R ear normal and L ear normal.   Neck:  No deformities, masses, or tenderness noted. Chest Wall:  No deformities, masses, or tenderness noted. Heart:  Normal rate and regular rhythm. S1 and S2 normal without gallop, murmur, click, rub or other extra sounds. Abdomen:  Bowel sounds positive,abdomen soft and non-tender without masses, organomegaly or hernias noted. Genitalia:  Pelvic Exam:        External: normal female genitalia without lesions or masses        Vagina: normal without lesions or masses        Cervix: normal without lesions or masses        Adnexa: normal bimanual exam without masses or fullness        Uterus: normal by palpation        Pap smear: performed   Impression & Recommendations:  Problem # 1:  HYPERTENSION (ICD-401.9) she will monitor at home home BPs 120/60s Her updated medication list for this problem includes:    Lisinopril 20 Mg Tabs (Lisinopril) .Marland Kitchen... 1 tablet by mouth daily  BP today: 150/98 Prior BP: 138/82 (04/05/2009)  Labs Reviewed: K+: 4.3 (09/14/2009) Creat: : 0.7 (09/14/2009)    Chol: 157 (09/14/2009)   HDL: 32.10 (09/14/2009)   LDL: 101 (09/14/2009)   TG: 119.0 (09/14/2009)  Problem # 2:  ADENOCARCINOMA, BREAST (ICD-174.9) no known recurrence  Problem # 3:  OSTEOPENIA (ICD-733.90) reviewed previous dexa---near normal results now on tamoxifen---check DEXA next year  Problem # 4:  HYPERLIPIDEMIA (ICD-272.4) well controlled continue current medications  Her updated medication list for this problem includes:    Simvastatin 40 Mg Tabs (Simvastatin) .Marland Kitchen... Take 1 tablet by mouth at bedtime as directed  Labs Reviewed: SGOT: 24 (09/14/2009)   SGPT: 20 (09/14/2009)   HDL:32.10 (09/14/2009), 34.10 (03/28/2009)  LDL:101 (09/14/2009), 64 (16/06/9603)  Chol:157 (09/14/2009), 116 (03/28/2009)  Trig:119.0 (09/14/2009), 91.0 (03/28/2009)  Problem # 5:  ABNORMAL GLANDULAR PAPANICOLAOU SMEAR OF CERVIX (ICD-795.00) repeat PAP today  Complete Medication List: 1)  Simvastatin 40 Mg Tabs (Simvastatin) .... Take 1 tablet by mouth at bedtime as directed 2)  Lisinopril 20 Mg Tabs (Lisinopril) .Marland Kitchen.. 1 tablet by mouth daily 3)  Alprazolam 0.5 Mg Tabs (Alprazolam) .... Take one tablet by mouth every other day as needed anxiety 4)  Tamoxifen Citrate 20 Mg Tabs (Tamoxifen citrate) .... Take 1 tablet by mouth once a day 5)  Aspirin 81 Mg Tbec (Aspirin) .... Once daily 6)  Multivitamins Tabs (Multiple vitamin) .... Once daily 7)  Omega-3  350 Mg Caps (Omega-3 fatty acids) .... Three daily 8)  Vitamin C 500 Mg Tabs (Ascorbic acid) .... Once daily 9)  Vitamin D3 1000 Unit Tabs (Cholecalciferol) .... Every other day

## 2010-10-10 NOTE — Progress Notes (Signed)
Summary: question  Phone Note Call from Patient Call back at Home Phone [phone]   Caller: Patient live Call For: [name] MD Summary of Call: Has set up lab for 6 month rov lipid, lft, bmet.  Do you want anything else?  Last july she also had cbc platelet with diff. Initial call taken by: Gladis Riffle, RN,  November 15, 2009 10:19 AM  Follow-up for Phone Call        orders as written Follow-up by: [name] MD,  November 15, 2009 2:22 PM  Additional Follow-up for Phone Call Additional follow up Details #1::        there are no orders--pt requested those listed as done last year for every six months.  No pt instructions on ov note of july 2010 Additional Follow-up by: Gladis Riffle, RN,  November 15, 2009 2:28 PM

## 2010-10-10 NOTE — Letter (Signed)
Summary: [facility]   Imported By: Maryln Gottron 11/09/2009 13:56:05  _____________________________________________________________________  External Attachment:    Type:   Image     Comment:   External Document

## 2010-10-10 NOTE — Miscellaneous (Signed)
Summary: Flu shot/[facility]  Flu shot/[facility]   Imported By: Maryln Gottron 06/15/2010 10:56:24  _____________________________________________________________________  External Attachment:    Type:   Image     Comment:   External Document

## 2010-10-10 NOTE — Miscellaneous (Signed)
Summary: BONE DENSITY  Clinical Lists Changes  Orders: Added new Test order of T-Bone Densitometry (77080) - Signed Added new Test order of T-Lumbar Vertebral Assessment (77082) - Signed 

## 2010-10-11 ENCOUNTER — Other Ambulatory Visit (INDEPENDENT_AMBULATORY_CARE_PROVIDER_SITE_OTHER): Payer: PRIVATE HEALTH INSURANCE | Admitting: Internal Medicine

## 2010-10-11 ENCOUNTER — Ambulatory Visit: Admit: 2010-10-11 | Payer: Self-pay | Admitting: Internal Medicine

## 2010-10-11 ENCOUNTER — Other Ambulatory Visit: Payer: Self-pay

## 2010-10-11 ENCOUNTER — Ambulatory Visit (INDEPENDENT_AMBULATORY_CARE_PROVIDER_SITE_OTHER): Payer: PRIVATE HEALTH INSURANCE | Admitting: Internal Medicine

## 2010-10-11 ENCOUNTER — Encounter: Payer: Self-pay | Admitting: Internal Medicine

## 2010-10-11 DIAGNOSIS — Z0000 Encounter for general adult medical examination without abnormal findings: Secondary | ICD-10-CM

## 2010-10-11 DIAGNOSIS — H6690 Otitis media, unspecified, unspecified ear: Secondary | ICD-10-CM

## 2010-10-11 DIAGNOSIS — N3000 Acute cystitis without hematuria: Secondary | ICD-10-CM

## 2010-10-11 LAB — BASIC METABOLIC PANEL
CO2: 29 mEq/L (ref 19–32)
Chloride: 107 mEq/L (ref 96–112)
Potassium: 4.5 mEq/L (ref 3.5–5.1)
Sodium: 142 mEq/L (ref 135–145)

## 2010-10-11 LAB — TSH: TSH: 1.42 u[IU]/mL (ref 0.35–5.50)

## 2010-10-11 LAB — CBC WITH DIFFERENTIAL/PLATELET
Eosinophils Relative: 1.5 % (ref 0.0–5.0)
MCV: 95.1 fl (ref 78.0–100.0)
Monocytes Absolute: 0.6 10*3/uL (ref 0.1–1.0)
Monocytes Relative: 9 % (ref 3.0–12.0)
Neutrophils Relative %: 63.8 % (ref 43.0–77.0)
Platelets: 175 10*3/uL (ref 150.0–400.0)
WBC: 6.6 10*3/uL (ref 4.5–10.5)

## 2010-10-11 LAB — POCT URINALYSIS DIPSTICK
Glucose, UA: NEGATIVE
Nitrite, UA: NEGATIVE
Urobilinogen, UA: 0.2

## 2010-10-11 LAB — HEPATIC FUNCTION PANEL: Albumin: 4 g/dL (ref 3.5–5.2)

## 2010-10-11 LAB — LIPID PANEL: Total CHOL/HDL Ratio: 4

## 2010-10-11 MED ORDER — LEVOFLOXACIN 500 MG PO TABS: 500.0000 mg | ORAL_TABLET | Freq: Every day | ORAL | Status: AC

## 2010-10-11 NOTE — Progress Notes (Signed)
  Subjective:    Patient ID: Angela [name],     DOB: 1945/10/04, 65 y.o.   MRN: [id number]  HPI    Review of Systems     Objective:   Physical Exam        Assessment & Plan:   Subjective:    Angela [name] is a 65 y.o. female who complains of dysuria and urgency. She has had symptoms for 3 days.  Patient denies back pain, fever and hematuria. Patient does not have a history of recurrent UTI. Patient does not have a history of pyelonephritis. Attempted cranberry juice with no significant improvement. Also treated for left OM 1/17 with 7d course of amoxicillin. +ear itching without discharge, drainage or pain. Tolerated abx course without difficulty.  The following portions of the patient's history were reviewed and updated as appropriate: allergies, current medications, past medical history and problem list.  Review of Systems Pertinent items are noted in HPI.    Objective:  Abd: NT, no masses, nondistened. MSK: no CVA tenerness ENT: right ear canal and tm nl. Left canal nl. Left TM mildly erythematous without bulge, effusion or perforation Laboratory:  Urine dipstick: 3+ for hemoglobin and 3+ for leukocyte esterase.   Micro exam: not done.    Assessment:    Acute cystitis     Plan:    Begin 7d course of levaquin for partially treated OM and UTI. Followup closely if no improvement or worsening.

## 2010-10-12 NOTE — Assessment & Plan Note (Signed)
Summary: swimmers ear/cjr   Vital Signs:  Patient profile:   65 year old female Menstrual status:  postmenopausal Weight:      164 pounds Pulse rate:   76 / minute BP sitting:   130 / 82  (left arm)  Vitals Entered By: Kyung Rudd, CMA (September 26, 2010 9:44 AM) CC: pt c/o swimmer's ear...visited urgent care in [place] and was rx'ed neomycin   Primary Care Provider:  [name], MD  CC:  pt c/o swimmer's ear...visited urgent care in [place] and was rx'ed neomycin.  History of Present Illness: Patient presents to clinic as a workin for evaluation of ear infection. Approximately 10d ago tx'ed at [facility] in [place] for possible OE. Noted left ear discomfort without drainage, fever or chills. Placed on abx gtts and symptoms persist despite regular use. Does swim regularly. No alleviating or exacerbating factors. No sick exposure.  Current Medications (verified): 1)  Simvastatin 40 Mg  Tabs (Simvastatin) .... Take 1 Tablet By Mouth At Bedtime As Directed 2)  Lisinopril 20 Mg Tabs (Lisinopril) .Marland Kitchen.. 1 Tablet By Mouth Daily 3)  Alprazolam 0.5 Mg  Tabs (Alprazolam) .... Take One Tablet By Mouth Every Other Day As Needed Anxiety 4)  Tamoxifen Citrate 20 Mg  Tabs (Tamoxifen Citrate) .... Take 1 Tablet By Mouth Once A Day 5)  Aspirin 81 Mg Tbec (Aspirin) .... Once Daily 6)  Multivitamins  Tabs (Multiple Vitamin) .... Once Daily 7)  Omega-3 350 Mg Caps (Omega-3 Fatty Acids) .... 3 By Mouth Once Daily 8)  Vitamin C 500 Mg Tabs (Ascorbic Acid) .... Once Daily 9)  Vitamin D3 1000 Unit Tabs (Cholecalciferol) .... Every Day 10)  Caltrate 600+d 600-400 Mg-Unit Tabs (Calcium Carbonate-Vitamin D) .... Two Times A Day  Allergies (verified): No Known Drug Allergies  Past History:  Past medical, surgical, family and social histories (including risk factors) reviewed, and no changes noted (except as noted below).  Past Medical History: Reviewed history from 07/13/2008 and no changes  required. Hyperlipidemia BCC- Basil cell carcinoma breast biopsy 1998 Osteopenia Breast CA 2009  Past Surgical History: Reviewed history from 07/09/2007 and no changes required. BCC-equired excision breast biopsy- 1998 Tonsillectomy  Family History: Reviewed history from 07/13/2008 and no changes required. father-heart disease MI 10yo Mother MI at age 8 Family History Hypertension-both parents Sister-suicide age 15 yo brother--no contact  Social History: Reviewed history from 03/03/2008 and no changes required. Retired-works one day weekly Married 1 out of 3 children-decased congenital heart (transposition of the great vessels) Never Smoked Alcohol use-yes- socially  Review of Systems General:  Denies chills, fever, and sweats. ENT:  Complains of earache; denies decreased hearing, ear discharge, sinus pressure, and sore throat.  Physical Exam  General:  Well-developed,well-nourished,in no acute distress; alert,appropriate and cooperative throughout examination Head:  Normocephalic and atraumatic without obvious abnormalities. No apparent alopecia or balding. Eyes:  pupils equal, pupils round, corneas and lenses clear, and no injection.   Ears:  R ear normal.  Left ear canal nl without erythema or exudate. No FB or obstruction. Left TM with moderate erythema and dullness. No TM perforation or bulging. Nose:  no external deformity.     Impression & Recommendations:  Problem # 1:  LOM (ICD-382.9) Assessment New Appears resolved OE. OM suggested on exam. Begin oral abx course. Followup if no improvement or worsening.  Her updated medication list for this problem includes:    Aspirin 81 Mg Tbec (Aspirin) ..... Once daily    Amoxicillin 875 Mg Tabs (Amoxicillin) .Marland KitchenMarland KitchenMarland KitchenMarland Kitchen  One by mouth bid  Complete Medication List: 1)  Simvastatin 40 Mg Tabs (Simvastatin) .... Take 1 tablet by mouth at bedtime as directed 2)  Lisinopril 20 Mg Tabs (Lisinopril) .Marland Kitchen.. 1 tablet by mouth  daily 3)  Alprazolam 0.5 Mg Tabs (Alprazolam) .... Take one tablet by mouth every other day as needed anxiety 4)  Tamoxifen Citrate 20 Mg Tabs (Tamoxifen citrate) .... Take 1 tablet by mouth once a day 5)  Aspirin 81 Mg Tbec (Aspirin) .... Once daily 6)  Multivitamins Tabs (Multiple vitamin) .... Once daily 7)  Omega-3 350 Mg Caps (Omega-3 fatty acids) .... 3 by mouth once daily 8)  Vitamin C 500 Mg Tabs (Ascorbic acid) .... Once daily 9)  Vitamin D3 1000 Unit Tabs (Cholecalciferol) .... Every day 10)  Caltrate 600+d 600-400 Mg-unit Tabs (Calcium carbonate-vitamin d) .... Two times a day 11)  Amoxicillin 875 Mg Tabs (Amoxicillin) .... One by mouth bid Prescriptions: AMOXICILLIN 875 MG TABS (AMOXICILLIN) one by mouth bid  #14 x 0   Entered and Authorized by:   Edwyna Perfect MD   Signed by:   Edwyna Perfect MD on 09/26/2010   Method used:   Electronically to        [facility] [address] # [phone]* (retail)       [address]       [place], [place]  [address]       Ph: [phone] or [phone]       Fax: [phone]   RxID:   [id number]    Orders Added: 1)  Est. Patient Level III [74259]

## 2010-10-12 NOTE — Progress Notes (Signed)
Summary: REFILL REQUEST  Phone Note Refill Request Message from:  Patient on August 25, 2010 2:09 PM  Refills Requested: Medication #1:  ALPRAZOLAM 0.5 MG  TABS Take one tablet by mouth every other day as needed anxiety   Notes: [facility], [address] [place].    Initial call taken by: Debbra Riding,  August 25, 2010 2:09 PM  Follow-up for Phone Call        this hasn't been filled since December 2009 Follow-up by: Alfred Levins, CMA,  August 25, 2010 2:15 PM  Additional Follow-up for Phone Call Additional follow up Details #1::        ok x one Additional Follow-up by: [name] MD,  August 25, 2010 2:41 PM    Additional Follow-up for Phone Call Additional follow up Details #2::    rx called in Follow-up by: Alfred Levins, CMA,  August 28, 2010 9:02 AM  Prescriptions: ALPRAZOLAM 0.5 MG  TABS (ALPRAZOLAM) Take one tablet by mouth every other day as needed anxiety  #20 x 0   Entered by:   Alfred Levins, CMA   Authorized by:   [name] MD   Signed by:   Alfred Levins, CMA on 08/28/2010   Method used:   Telephoned to ...       [facility]  [address] # [phone]* (retail)       [address]       [place], [place]  [address]       Ph: [phone] or [phone]       Fax: [phone]   RxID:   [id number]

## 2010-10-12 NOTE — Letter (Signed)
Summary: [facility]   Imported By: Maryln Gottron 09/15/2010 15:48:50  _____________________________________________________________________  External Attachment:    Type:   Image     Comment:   External Document

## 2010-10-13 ENCOUNTER — Other Ambulatory Visit: Payer: Self-pay | Admitting: Internal Medicine

## 2010-10-13 DIAGNOSIS — E785 Hyperlipidemia, unspecified: Secondary | ICD-10-CM

## 2010-10-18 ENCOUNTER — Encounter: Payer: Self-pay | Admitting: Internal Medicine

## 2010-10-18 ENCOUNTER — Ambulatory Visit (INDEPENDENT_AMBULATORY_CARE_PROVIDER_SITE_OTHER): Payer: PRIVATE HEALTH INSURANCE | Admitting: Internal Medicine

## 2010-10-18 VITALS — BP 146/98 | HR 84 | Temp 99.0°F | Ht 66.0 in | Wt 158.0 lb

## 2010-10-18 DIAGNOSIS — E785 Hyperlipidemia, unspecified: Secondary | ICD-10-CM

## 2010-10-18 MED ORDER — SIMVASTATIN 40 MG PO TABS: 40.0000 mg | ORAL_TABLET | Freq: Every day | ORAL | Status: DC

## 2010-10-18 NOTE — Progress Notes (Signed)
  Subjective:    Patient ID: Angela [name], female    DOB: Apr 12, 1946, 65 y.o.   MRN: [id number]  HPI  Pt here for cpx  New complaints---ear pain resolved  Recent cystitis--resolved  Review of Systems Has had trouble with foot pain--seeing ortho  Seeing oncology---recent breast MR nd recent DEXA    Objective:   Physical Exam  Constitutional: She appears well-developed and well-nourished. No distress.  HENT:  Head: Normocephalic.  Right Ear: External ear normal.  Left Ear: External ear normal.  Eyes: EOM are normal. Right eye exhibits no discharge. Left eye exhibits no discharge.  Neck: No thyromegaly present.  Cardiovascular: Normal rate.   Pulmonary/Chest: No respiratory distress. She has no wheezes. She exhibits no tenderness.  Abdominal: Soft. Bowel sounds are normal. She exhibits no distension. There is no tenderness.  Genitourinary: Vagina normal and uterus normal. No vaginal discharge found.       Pap done  Musculoskeletal: She exhibits no edema.  Lymphadenopathy:    She has no cervical adenopathy.  Neurological: No cranial nerve deficit.  Psychiatric: She has a normal mood and affect. Her behavior is normal.          Assessment & Plan:    health maintenance up-to-date. Reviewed her diagnosis of breast cancer and hypertension. Her blood pressure somewhat elevated today. She states her blood pressure was in the 120/70 range. I've refilled medications as requested. She will continue current medications and follow up with me in 6 months.

## 2010-10-24 ENCOUNTER — Other Ambulatory Visit (HOSPITAL_COMMUNITY)
Admission: RE | Admit: 2010-10-24 | Discharge: 2010-10-24 | Disposition: A | Discharge status: Home / Self Care | Payer: PRIVATE HEALTH INSURANCE | Source: Ambulatory Visit | Attending: Internal Medicine | Admitting: Internal Medicine

## 2010-10-24 DIAGNOSIS — Z01419 Encounter for gynecological examination (general) (routine) without abnormal findings: Secondary | ICD-10-CM | POA: Insufficient documentation

## 2010-10-25 ENCOUNTER — Telehealth: Payer: Self-pay | Admitting: *Deleted

## 2010-10-25 ENCOUNTER — Other Ambulatory Visit: Payer: Self-pay | Admitting: Internal Medicine

## 2010-10-25 DIAGNOSIS — Z0000 Encounter for general adult medical examination without abnormal findings: Secondary | ICD-10-CM

## 2010-10-25 NOTE — Telephone Encounter (Signed)
Pt aware of normal results.

## 2010-10-25 NOTE — Telephone Encounter (Signed)
Please call pt with pap results °

## 2010-11-16 ENCOUNTER — Telehealth: Payer: Self-pay | Admitting: Internal Medicine

## 2010-11-16 NOTE — Telephone Encounter (Signed)
Pt had colonoscopy in 2009. Pt would like to know should she do stool sample (hemocult)

## 2010-11-17 NOTE — Telephone Encounter (Signed)
Probably not necessary Can discuss next OV

## 2010-11-17 NOTE — Telephone Encounter (Signed)
Pt aware, appt scheduled at the end of summer and she will discuss with Dr Cato Mulligan then.

## 2010-11-17 NOTE — Telephone Encounter (Signed)
Triage vm------pt called again checking on a hemocult sample. If needed, please mail it to her home in [place].

## 2011-01-10 ENCOUNTER — Encounter (INDEPENDENT_AMBULATORY_CARE_PROVIDER_SITE_OTHER): Payer: Self-pay | Admitting: General Surgery

## 2011-01-23 NOTE — Discharge Summary (Signed)
Angela [name], Angela [name]                 ACCOUNT NO.:  [id number]   MEDICAL RECORD NO.:  [id number]          PATIENT TYPE:  OIB   LOCATION:  1534                         FACILITY:  [facility]   PHYSICIAN:  Leonie Man, M.D.   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  Pathology is pending.   PROCEDURE:  Partial cecectomy with appendectomy.   COMPLICATIONS:  None.   CONDITION ON DISCHARGE:  Improved.   FOLLOWUP:  Scheduled for 2 weeks post discharge.   ADDITIONAL MEDICATIONS ON DISCHARGE:  Dilaudid 1 mg p.r.n. for pain.   Note, the patient is a 65 year old lady who was noted to have some GI  bleeding.  She underwent routine colonoscopy which showed a tumor at the  appendiceal orifice.  The biopsies were inconclusive as to the character  of this tumor.  She then had a CT scan of the abdomen which showed an  enlarged appendix.  Based on this, the patient came to the Operating  Room for appendectomy and the possibility of a partial cecectomy and/or  right hemicolectomy.  The patient underwent appendectomy and partial  cecectomy.  Pathology is pending as to the final diagnosis of this  tumor.  Her postoperative course has been benign with normal resumption  of diet and activity.  Her incision is healing well at the time of  discharge.  She is being discharged now to be followed up in 2 weeks as  noted above.      Leonie Man, M.D.  Electronically Signed     PB/MEDQ  D:  03/24/2008  T:  03/24/2008  Job:  [id number]

## 2011-01-23 NOTE — Op Note (Signed)
NAMECHRISTINA, [name]                 ACCOUNT NO.:  [id number]   MEDICAL RECORD NO.:  [id number]          PATIENT TYPE:  AMB   LOCATION:  DAY                          FACILITY:  [facility]   PHYSICIAN:  Leonie Man, M.D.   DATE OF BIRTH:  1945-12-25   DATE OF PROCEDURE:  03/22/2008  DATE OF DISCHARGE:                               OPERATIVE REPORT   PREOPERATIVE DIAGNOSIS:  Appendiceal mass, rule out carcinoma.   POSTOPERATIVE DIAGNOSIS:  Appendiceal mass, rule out carcinoma.  Pathology pending.   PROCEDURE:  Appendectomy with partial cecectomy.   SURGEON:  Ballen   ASSISTANT:  OR tech.   ANESTHESIA:  General.   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   SPECIMENS TO LAB:  The appendix with a portion of cecum.   ESTIMATED BLOOD LOSS:  Minimal.   COMPLICATIONS:  None.   The patient returned the PACU in excellent condition.   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  She underwent colonoscopy which showed a  mass the region of the appendiceal orifice.  She subsequently underwent  CT scans of the abdomen which also confirmed a mass in the region of a  dilated and swollen appendix.  She was referred and now comes to the  operating room for definitive diagnosis.  The plan for her is for an  extended appendectomy up to include a cecectomy or right hemicolectomy.  The patient understands the risks and potential benefits of planned  surgery and gives her consent to same.   PROCEDURE:  The patient is positioned supinely and following the  induction of satisfactory general endotracheal anesthesia, the abdomen  is prepped and draped to be included in the sterile operative field.  Positive identification of the patient as Angela [name] and the operation  to be done is an appendectomy with partial cecectomy.  All the usual  surgical precautions have been carried out satisfactorily.   A  transverse incision was made just at the level of the umbilicus  laterally, deepened through skin and subcutaneous tissue down to the  external oblique aponeurosis.  The flank muscles are split in the  direction of their fibers down to the peritoneum.  The peritoneum was  grasped and opened.  The cecum and appendix are mobilized.  There was no  evidence of any additional abnormalities in or around the cecum. There  was a single, small normal-appearing lymph node in the mesoappendix.  The mesoappendix was then dissected free from around the cecum and  carried up to its base, leaving the majority of the mesoappendix along  with the  appendix.  The cecum was then transected, taking approximately  a 2 inch portion of the cecum along with the appendix with GIA 75  stapling device.  The suture line was checked for hemostasis and noted  to be dry.  The appendix and the cecum was then opened up.  This  revealed a somewhat mucoid tumor which was forwarded for pathologic  evaluation.  In discussion with the  pathologist, this appeared to be a  benign finding; however, final pathology is an is pending and will  depend on the permanent pathologic evaluation.  The right lower quadrant  was then thoroughly irrigated with saline.  Sponge and instrument counts  doubly verified.  The peritoneum closed with a running 0 Vicryl.  The  flank muscles closed individually with 0 Vicryl sutures and subcutaneous  tissues closed with 3-0 Vicryl sutures.  Skin was closed with a 4-0  Monocryl suture reinforced with Steri-Strips with a sterile dressing  applied.  The anesthetic reversed.  The patient removed from the  operating room to the recovery room in stable condition.  She tolerated  the procedure well.      Leonie Man, M.D.  Electronically Signed     PB/MEDQ  D:  03/22/2008  T:  03/22/2008  Job:  [id number]   cc:   Leonie Man, M.D.  [address]  [address]  [place]  [place] [address]

## 2011-01-23 NOTE — Op Note (Signed)
NAMEBERNETHA, ANSCHUTZ                 ACCOUNT NO.:  [id number]   MEDICAL RECORD NO.:  [id number]          PATIENT TYPE:  AMB   LOCATION:  DSC                          FACILITY:  [facility]   PHYSICIAN:  Ollen Gross. Vernell Morgans, M.D. DATE OF BIRTH:  Dec 10, 1945   DATE OF PROCEDURE:  07/06/2008  DATE OF DISCHARGE:                               OPERATIVE REPORT   PREOPERATIVE DIAGNOSIS:  Left breast cancer.   POSTOPERATIVE DIAGNOSIS:  Left breast cancer.   PROCEDURE:  Left breast needle-localized lumpectomy and sentinel node  biopsy with injection of blue dye.   SURGEON:  Ollen Gross. Vernell Morgans, MD   ANESTHESIA:  General via LMA.   PROCEDURE:  After informed consent was obtained, the patient was brought  to the operating room and placed in the supine position on the operating  room table.  After adequate induction of general anesthesia, the  patient's left chest, breast, and axilla were prepped with Betadine and  draped in the usual sterile manner.  Earlier in the day, the patient had  undergone injection of 1 mCi of technetium sulfur colloid in the  subareolar position.  The patient had also undergone a wire localization  procedure and the wire was entered in the upper outer quadrant of the  left breast laterally and heading medially.  The NeoProbe device was  used to identify a hot spot in the left axilla.  A small transverse  incision was made overlying this hot spot.  This incision was carried  down through the skin and subcutaneous tissue sharply with  electrocautery.  Prior to doing this, 2 mL of methylene blue and 3 mL of  injectable saline were also injected in the subareolar position and the  breast was massaged for several minutes.  Dissection was then carried  into the axilla using the NeoProbe to guide the direction.  Blunt  hemostat direction was carried out in the axilla.  Initially, a hot blue  lymph node was identified and it had ex vivo counts around 800.  It was  excised sharply with  electrocautery and then the lymphatics were clamped  with hemostats, divided and ligated with 3-0 Vicryl ties.  Four other  hot spots were identified.  None of the other hot spots were blue, but  were hot.  Each of these areas was again excised sharply with  electrocautery and the lymphatics were then clamped with a hemostat,  divided and ligated a 3-0 Vicryl tie.  Once this was accomplished, all  five of the sentinel nodes were sent to Pathology for further  evaluation.  All of had ex vivo counts greater than 100.  At this point,  no other significant radioactivity or palpable lymph nodes or blue dye  was appreciated in the axilla.  The deep layer of this wound was then  closed with interrupted 3-0 Vicryl stitches and the skin was closed with  a running 4-0 Monocryl subcuticular stitch.  Attention was then turned  to the breast.  The lesion was within the 2 o'clock position left  breast.  A curvilinear transversely oriented incision  to include the  wire entry site was made with a 15 blade knife.  This incision was  carried down through the skin and into the subcutaneous tissue sharply  with electrocautery.  The skin edges were retracted with skin hooks.  The path of the wire could then be palpated.  A circular portion of  breast tissue was then excised sharply with electrocautery around the  path of the wire.  This dissection was taken down to the muscle of the  chest wall.  Once this specimen was completely removed, it was oriented  with the pain kit according to the assigned colors and this was sent  with the specimen to pathology.  Specimen radiograph showed the clip in  the apparent mass to be in the center of the specimen and on palpating  the cavity, there was another hard area just at the inferior margin and  so this area was also taken sharply with electrocautery and marked with  a stitch on the new true surgical margin.  This was the inferior margin.  The patient had some fiber  cystic thick breast tissue medially and  otherwise the rest of the cavity felt very fatty in nature.  Hemostasis  was achieved using the Bovie electrocautery.  The each of the wounds was  infiltrated with 0.25% Marcaine.  The breast wound was irrigated with  copious amounts of saline.  The subcutaneous tissue was closed over the  cavity with interrupted 3-0 Vicryl stitches and then the skin was closed  with a running for Monocryl subcuticular stitch.  Dermabond dressing was  applied to both incisions.  The patient tolerated the procedure well.  At the end of the case, all needle, sponge, and instrument counts were  correct.  The patient was then awakened and taken to the recovery room  in stable condition.      Ollen Gross. Vernell Morgans, M.D.  Electronically Signed     PST/MEDQ  D:  07/06/2008  T:  07/07/2008  Job:  [id number]

## 2011-01-26 NOTE — Discharge Summary (Signed)
Angela [name], PARKEY NO.:  [id number]   MEDICAL RECORD NO.:  [id number]                   PATIENT TYPE:  INP   LOCATION:  1825                                 FACILITY:  [facility]   PHYSICIAN:  Willa Rough, M.D.                  DATE OF BIRTH:  [birth year]   DATE OF ADMISSION:  05/04/2003  DATE OF DISCHARGE:                           DISCHARGE SUMMARY - REFERRING   PRIMARY CARE PHYSICIAN:  Dr. Dimple Casey, [facility],  [place].   HISTORY OF PRESENT ILLNESS:  This patient has no prior cardiac history. She  is a 65 year old female. She was at school, talking to a student's parent  this morning. She noted onset of chest discomfort. She rated the pain as  3/10. She had no nausea, no radiation to the neck or arms. No diaphoresis.  She was not short of breath. The pain lasted about 30 minutes. It was  persistent. She has had none since that time. She is chest pain free in the  emergency room. She went to the school nurse who took her temperature and  blood pressure and suggested she visit her primary care physician.  Electrocardiogram was obtained; it was nondiagnostic. She was given four 81-  mg aspirin, but because she has a strong family history of myocardial  infarction in both parents and does have an episode of chest pain, she was  recommended to seek admission to the emergency room at [facility].   PAST MEDICAL HISTORY:  Negative for prior left heart catheterization,  negative for coronary artery bypass graft surgery. She has never had  Cardiolite study or echocardiogram. She does have history of hyperlipidemia.  Her lipid profile on Feb 04, 2003 shows cholesterol 210, HDL cholesterol 36,  triglycerides 118, LDL cholesterol 150. This despite being on statin  therapy. She also has a history of benign breast biopsy in 1998, and she has  had a history of basal cell cancer status post resection.   ALLERGIES:  No  known drug allergies.   MEDICATIONS:  Pravachol 80 mg at bedtime.   SOCIAL HISTORY:  The patient lives in [place], [place] with her husband.  She has two grown children. She does not smoke. She has three glasses of  wine a week. She does not use recreational drugs. She is a Camera operator.   FAMILY HISTORY:  Mother died at age 62 of a myocardial infarction. Father  died at age 78 of a myocardial infarction. She has one sister who died of  suicide. One brother, unknown health status.   REVIEW OF SYSTEMS:  The patient reports no recent fevers, chills, sweats,  weight change, or adenopathy. She has no HEENT problems such as vertigo,  voice changes, photophobia, vision, or hearing loss. She has no recent  epistaxis or headache history. She has no skin rashes or lesions. She does  have this transient 30-minute midsternal chest pain. She is free of chest  pain now. She has no accompanying shortness of breath. She has no dyspnea on  exertion, no paroxysmal nocturnal dyspnea, no history of edema or  palpitations, no history of presyncope or syncope. She does not have  claudication. She does not have recent dysuria or nocturia. She does not  have a history of mood disturbance, depression, or anxiety. She does not  have any arthralgias, joint swelling, or deformities. No history of reflux  or change in bowel habits. She has no prior history of diabetes, cerebral  vascular accident, myocardial infarction, pulmonary embolism or deep vein  thrombosis, peptic ulcer disease. No history of GI bleeding. Diagnosis of  cancer.   PHYSICAL EXAMINATION:  VITAL SIGNS:  Temperature 98.5, pulse 65 and regular,  respirations 16, blood pressure 156/87, oxygen saturation 96% on room air.  HEENT:  Normocephalic, atraumatic. Eyes:  Pupils are equal, round, and  reactive to light. Extraocular movements intact. Sclerae are clear. Mucous  membranes are pink, moist without lesion or erythema. She does  not wear  dentures.  NECK:  Supple. There are no carotid bruits auscultated. No jugular venous  distention. No cervical lymphadenopathy.  HEART:  Regular rate and rhythm with normal S1 and S2 without murmur.  LUNGS: Clear to auscultation and percussion bilaterally.  INTEGUMENT:  Without lesions or rash.  ABDOMEN:  Soft, nondistended, bowel sounds are present. No rebound or  guarding. No hepatosplenomegaly.  EXTREMITIES:  She has no joint deformities or effusions. No costovertebral  angle tenderness.  NEUROLOGICAL:  She is alert and oriented x3. Cranial nerves II-XII grossly  intact. She has normal strength in all extremities. She has no dependent  edema. She has palpable pulses in both feet and 4/4 bilaterally, and radial  pulses are 4/4 bilaterally.   STUDIES:  Chest x-ray not obtained. Electrocardiogram shows a rate of 65,  sinus rhythm, axis is +60 interval PR 158, QRS 88, QTC 446. Possible  evidence of hypertrophy.   Laboratories at the time of admission:  Sodium 141, potassium 3.9, chloride  103, bicarbonate 31, BUN 10, creatinine 0.8, glucose 99. Alkaline  phosphatase 76, SGOT 26, SGPT 32. First set of cardiac enzymes as mentioned  above, myoglobin 75, CK-MB 1.3, troponin I less than 0.05. Second set of  cardiac enzymes one hour later, myoglobin 87, CK-MB 1.2, troponin I less  than 0.05. PTT 30, PT 12.4, INR 0.9.   IMPRESSION:  1. Admitted to the emergency room with transient chest pain/discomfort.  2. Hypercholesterolemia.  3. Strong family history of myocardial infarction.   PLAN:  Exercise Cardiolite August 25 and discharge if the Cardiolite is  negative.      Maple Mirza, P.A.                    Willa Rough, M.D.    GM/MEDQ  D:  05/04/2003  T:  05/05/2003  Job:  [id number]

## 2011-01-26 NOTE — Discharge Summary (Signed)
Angela [name], BOXER                           ACCOUNT NO.:  [id number]   MEDICAL RECORD NO.:  [id number]                   PATIENT TYPE:  INP   LOCATION:  4730                                 FACILITY:  [facility]   PHYSICIAN:  Willa Rough, M.D.                  DATE OF BIRTH:  1946/01/26   DATE OF ADMISSION:  05/04/2003  DATE OF DISCHARGE:  05/05/2003                                 DISCHARGE SUMMARY   DISCHARGE DIAGNOSES:  1. Chest pain, etiology unclear.  2. Treated dyslipidemia.  3. Family history of coronary artery disease.   PROCEDURES:  Stress Cardiolite on 05/05/2003: The patient exercised to stage  IV Bruce protocol, completing 9 minutes and 32 seconds of exercise.  She  achieved a heart rate of 164 which is 100% of her predicted maximal heart  rate for her age.  Blood pressure rose appropriately to 180/90 from a  baseline of 114/85.  She did complain of some fatigue and shortness of  breath, but this was not limiting.  She had no chest pain.  There were no  acute EKG changes.  Cardiac images revealed no ischemia, EF 68%.   HOSPITAL COURSE:  Please see the dictated note by Dr. Myrtis Ser for complete  details.  Briefly, this 65 year old female with no prior cardiac history  presented to the [facility] Emergency Room with complaints of chest  pain.  Her enzymes remained negative.  Her EKG was negative. She went for  Cardiolite testing on 05/05/2003.  Results are noted above.  Her chest x-ray  showed no acute disease and slight hyperaeration.   It was felt she was stable enough for discharge home on 05/05/2003, with the  negative Cardiolite.  Dr. Jens Som felt that she should follow up with Dr.  Myrtis Ser two weeks after discharge.   LABORATORY DATA:  White count 8700, hemoglobin 14.9, hematocrit 43.1,  platelet count 239,000.  INR 0.9.  Sodium 141, potassium 3.9, chloride 103,  CO2 31, glucose 99, BUN 10, creatinine 0.8, total bilirubin 0.4, alkaline  phosphatase 76, AST 26,  ALT 32, total protein 7.2, albumin 4.4, calcium 9.9.  Cardiac enzymes negative x 3. Total cholesterol 157, triglycerides 189, HDL  29, LDL 90.  TSH 3.108.   Chest x-ray as noted above.   DISCHARGE MEDICATIONS:  1. Pravachol 80 mg daily.  2. Aspirin 81 mg daily.   ACTIVITY:  As tolerated.   DIET:  Low-fat, low-sodium.    FOLLOW UP:  With Dr. Myrtis Ser September 15 at 12:15, and she should see Dr. Dimple Casey  in one to two weeks, and she should call for appointment.  As noted above,  the patient has a severely reduced HDL.  Consideration should be given in  the future for adding niacin to her medical therapy.  Primary risk reduction  has been discussed with her to include  diet and exercise and to  refrain  from smoking as well as to keep cholesterol under control.      Tereso Newcomer, P.A.                        Willa Rough, M.D.    SW/MEDQ  D:  05/05/2003  T:  05/05/2003  Job:  [id number]   cc:   Dr. Verlon Au [facility]

## 2011-02-01 ENCOUNTER — Encounter: Payer: Self-pay | Admitting: Gastroenterology

## 2011-02-01 ENCOUNTER — Other Ambulatory Visit: Payer: Self-pay | Admitting: Hematology and Oncology

## 2011-02-01 DIAGNOSIS — Z9889 Other specified postprocedural states: Secondary | ICD-10-CM

## 2011-02-09 ENCOUNTER — Telehealth: Payer: Self-pay | Admitting: Internal Medicine

## 2011-02-09 NOTE — Telephone Encounter (Signed)
ok 

## 2011-02-09 NOTE — Telephone Encounter (Addendum)
Pt would like to switch to dr hodgin due availability issues. Can pt switch? 02-13-11 Left message on pt answering machine ok to switch PCP to Doc hodgin

## 2011-02-21 ENCOUNTER — Other Ambulatory Visit: Payer: Self-pay | Admitting: Internal Medicine

## 2011-03-20 ENCOUNTER — Encounter: Payer: Self-pay | Admitting: Gastroenterology

## 2011-03-20 ENCOUNTER — Ambulatory Visit (INDEPENDENT_AMBULATORY_CARE_PROVIDER_SITE_OTHER): Payer: PRIVATE HEALTH INSURANCE | Admitting: Gastroenterology

## 2011-03-20 DIAGNOSIS — Z853 Personal history of malignant neoplasm of breast: Secondary | ICD-10-CM | POA: Insufficient documentation

## 2011-03-20 DIAGNOSIS — C50919 Malignant neoplasm of unspecified site of unspecified female breast: Secondary | ICD-10-CM

## 2011-03-20 DIAGNOSIS — K625 Hemorrhage of anus and rectum: Secondary | ICD-10-CM

## 2011-03-20 MED ORDER — HYDROCORTISONE ACETATE 25 MG RE SUPP: 25.0000 mg | Freq: Two times a day (BID) | RECTAL | Status: DC

## 2011-03-20 NOTE — Progress Notes (Signed)
History of Present Illness: Angela [name] is a 65 year old white female with history of breast cancer and hypertension referred at the request of Dr. Rodena Medin for evaluation of rectal bleeding. In 2009 she underwent colonoscopy because of very limited rectal bleeding. An appendiceal mass is seen.  She subsequently underwent a right hemicolectomy. Inflamed colonic mucosa only is seen. Several months ago she had limited rectal bleeding consisting of small amounts of blood mixed with her stools and on the tissue. She also noted some mild rectal itching following a bowel movement. She is without pain. There has been no change in bowel habits or history of melena.    Review of Systems: Pertinent positive and negative review of systems were noted in the above HPI section. All other review of systems were otherwise negative.    Current Medications, Allergies, Past Medical History, Past Surgical History, Family History and Social History were reviewed in [employer] record . Vital signs were reviewed in today's medical record Physical Exam: General: Well developed , well nourished, no acute distress Head: Normocephalic and atraumatic Eyes:  sclerae anicteric, EOMI Ears: Normal auditory acuity Mouth: No deformity or lesions Neck: Supple, no masses or thyromegaly Lungs: Clear throughout to auscultation Heart: Regular rate and rhythm; no murmurs, rubs or bruits Abdomen: Soft, non tender and non distended. No masses, hepatosplenomegaly or hernias noted. Normal Bowel sounds Rectal:no rectal masses; stool heme negative Musculoskeletal: Symmetrical with no gross deformities  Skin: No lesions on visible extremities Pulses:  Normal pulses noted Extremities: No clubbing, cyanosis, edema or deformities noted Neurological: Alert oriented x 4, grossly nonfocal Cervical Nodes:  No significant cervical adenopathy Inguinal Nodes: No significant inguinal adenopathy Psychological:  Alert and  cooperative. Normal mood and affect

## 2011-03-20 NOTE — Assessment & Plan Note (Addendum)
Limited rectal bleeding is likely due to hemorrhoids.  Recommendations #1 Anusol HC suppositories

## 2011-03-20 NOTE — Patient Instructions (Signed)
Hemorrhoids Hemorrhoids are dilated (enlarged) veins around the rectum. Sometimes clots will form in the veins. This makes them swollen and painful. These are called thrombosed hemorrhoids. Causes of hemorrhoids include:  Pregnancy: this increases the pressure in the hemorrhoidal veins.   Constipation.   Straining to have a bowel movement.  HOME CARE INSTRUCTIONS  Eat a well balanced diet and drink 6 to 8 glasses of water every day to avoid constipation. You may also use a bulk laxative.   Avoid straining to have bowel movements.   Keep anal area dry and clean.   Only take over-the-counter or prescription medicines for pain, discomfort, or fever as directed by your caregiver.  If thrombosed:  Take hot sitz baths for 20 to 30 minutes, 3 to 4 times per day.   If the hemorrhoids are very tender and swollen, place ice packs on area as tolerated. Using ice packs between sitz baths may be helpful. Fill a plastic bag with ice and use a towel between the bag of ice and your skin.   Special creams and suppositories (Anusol, Nupercainal, Wyanoids) may be used or applied as directed.   Do not use a donut shaped pillow or sit on the toilet for long periods. This increases blood pooling and pain.   Move your bowels when your body has the urge; this will require less straining and will decrease pain and pressure.   Only take over-the-counter or prescription medicines for pain, discomfort, or fever as directed by your caregiver.  SEEK MEDICAL CARE IF:  You have increasing pain and swelling that is not controlled with your prescription.   You have uncontrolled bleeding.   You have an inability or difficulty having a bowel movement.   You have pain or inflammation outside the area of the hemorrhoids.  MAKE SURE YOU:   Understand these instructions.   Will watch your condition.   Will get help right away if you are not doing well or get worse.  Document Released: 08/24/2000 Document  Re-Released: 08/09/2008 [employer] Patient Information 2011 [employer].  

## 2011-04-19 ENCOUNTER — Other Ambulatory Visit: Payer: PRIVATE HEALTH INSURANCE

## 2011-04-27 ENCOUNTER — Ambulatory Visit: Payer: PRIVATE HEALTH INSURANCE | Admitting: Internal Medicine

## 2011-05-07 ENCOUNTER — Other Ambulatory Visit (INDEPENDENT_AMBULATORY_CARE_PROVIDER_SITE_OTHER): Payer: PRIVATE HEALTH INSURANCE

## 2011-05-07 DIAGNOSIS — E785 Hyperlipidemia, unspecified: Secondary | ICD-10-CM

## 2011-05-07 LAB — LIPID PANEL
Cholesterol: 157 mg/dL (ref 0–200)
LDL Cholesterol: 90 mg/dL (ref 0–99)

## 2011-05-07 LAB — BASIC METABOLIC PANEL
BUN: 18 mg/dL (ref 6–23)
CO2: 28 mEq/L (ref 19–32)
Chloride: 103 mEq/L (ref 96–112)
Creatinine, Ser: 0.7 mg/dL (ref 0.4–1.2)

## 2011-05-07 LAB — HEPATIC FUNCTION PANEL
Alkaline Phosphatase: 64 U/L (ref 39–117)
Bilirubin, Direct: 0 mg/dL (ref 0.0–0.3)
Total Protein: 7 g/dL (ref 6.0–8.3)

## 2011-05-15 ENCOUNTER — Other Ambulatory Visit: Payer: PRIVATE HEALTH INSURANCE

## 2011-05-21 ENCOUNTER — Ambulatory Visit
Admission: RE | Admit: 2011-05-21 | Discharge: 2011-05-21 | Disposition: A | Discharge status: Home / Self Care | Payer: BC Managed Care – PPO | Source: Ambulatory Visit | Attending: Hematology and Oncology | Admitting: Hematology and Oncology

## 2011-05-21 DIAGNOSIS — Z9889 Other specified postprocedural states: Secondary | ICD-10-CM

## 2011-05-22 ENCOUNTER — Encounter: Payer: PRIVATE HEALTH INSURANCE | Admitting: Internal Medicine

## 2011-05-22 ENCOUNTER — Encounter: Payer: Self-pay | Admitting: Family Medicine

## 2011-05-22 ENCOUNTER — Ambulatory Visit (INDEPENDENT_AMBULATORY_CARE_PROVIDER_SITE_OTHER): Payer: BC Managed Care – PPO | Admitting: Family Medicine

## 2011-05-22 ENCOUNTER — Ambulatory Visit: Payer: PRIVATE HEALTH INSURANCE | Admitting: Internal Medicine

## 2011-05-22 VITALS — BP 130/90 | Temp 98.8°F | Wt 162.0 lb

## 2011-05-22 DIAGNOSIS — E785 Hyperlipidemia, unspecified: Secondary | ICD-10-CM

## 2011-05-22 DIAGNOSIS — I10 Essential (primary) hypertension: Secondary | ICD-10-CM

## 2011-05-22 DIAGNOSIS — Z23 Encounter for immunization: Secondary | ICD-10-CM

## 2011-05-22 MED ORDER — SIMVASTATIN 40 MG PO TABS: 40.0000 mg | ORAL_TABLET | Freq: Every day | ORAL | Status: DC

## 2011-05-22 NOTE — Patient Instructions (Signed)
.    Continue your current medications.  Remember to walk 30 minutes daily.  Follow-up annual exam in February 2013  Fasting labs one week prior

## 2011-05-22 NOTE — Progress Notes (Signed)
  Subjective:    Patient ID: ZAKARI COUCHMAN, female    DOB: 12/10/1945, 65 y.o.   MRN: [id number]  HPI Kinisha is a 65 year old female, nonsmoker........Marland Kitchen Retired IT sales professional, who lives in [place]........ Who comes in today for follow-up of hyperlipidemia, and hypertension.  She takes 20 mg of simvastatin nightly along with an 81-mg baby aspirin, lipids recently drawn were normal along with LFTs and blood sugar.  She is here today for follow-up.  She also takes lisinopril 20 mg daily for hypertension.  BP 130/90.  She was given Xanax which she did when she was being treated for breast cancer.  She is asymptomatic recent mammogram is normal.  Advised not to take the Xanax.  She sees gynecologist and oncologist on a regular basis.   Review of Systems General and metabolic review of systems otherwise negative.  All vaccinations up to date including tetanus booster 2011.  Flu shot given today    Objective:   Physical Exam Well-nourished female in no acute distress       Assessment & Plan:  Hyperlipidemia continue the simvastatin 40 mg one half tab nightly follow-up CPX in February.  Hypertension.  Continue current medications.  She states she may want to stay with Dr.BS since I am in the process of slowing down!!!!!!!!!!!!!

## 2011-06-07 LAB — COMPREHENSIVE METABOLIC PANEL
ALT: 30
Albumin: 4.5
Alkaline Phosphatase: 82
BUN: 16
Chloride: 101
Glucose, Bld: 109 — ABNORMAL HIGH
Potassium: 3.6
Total Bilirubin: 1.1

## 2011-06-07 LAB — URINALYSIS, ROUTINE W REFLEX MICROSCOPIC
Ketones, ur: NEGATIVE
Leukocytes, UA: NEGATIVE
Nitrite: NEGATIVE
Protein, ur: 100 — AB
Urobilinogen, UA: 0.2

## 2011-06-07 LAB — CBC
HCT: 45.6
Hemoglobin: 15.7 — ABNORMAL HIGH
RBC: 5.05
WBC: 8.9

## 2011-06-07 LAB — TYPE AND SCREEN: ABO/RH(D): O POS

## 2011-06-07 LAB — PROTIME-INR: INR: 1

## 2011-06-07 LAB — DIFFERENTIAL
Basophils Absolute: 0.1
Lymphocytes Relative: 21
Monocytes Absolute: 0.7
Neutro Abs: 6.2

## 2011-06-07 LAB — ABO/RH: ABO/RH(D): O POS

## 2011-06-07 LAB — URINE MICROSCOPIC-ADD ON

## 2011-06-07 LAB — APTT: aPTT: 36

## 2011-06-11 LAB — CBC
HCT: 42.8
Hemoglobin: 14.5
MCHC: 33.9
MCV: 92.8
Platelets: 219
RBC: 4.61
RDW: 12.1
WBC: 6.8

## 2011-06-11 LAB — COMPREHENSIVE METABOLIC PANEL
ALT: 24
AST: 24
Albumin: 4.3
Alkaline Phosphatase: 81
BUN: 10
CO2: 29
Calcium: 9.5
Chloride: 105
Creatinine, Ser: 0.58
GFR calc Af Amer: >60
GFR calc non Af Amer: >60
Glucose, Bld: 103 — ABNORMAL HIGH
Potassium: 4.2
Sodium: 140
Total Bilirubin: 0.8
Total Protein: 6.9

## 2011-06-11 LAB — DIFFERENTIAL
Eosinophils Absolute: 0.1
Lymphocytes Relative: 31
Lymphs Abs: 2.1
Monocytes Relative: 8
Neutrophils Relative %: 59

## 2011-06-11 LAB — CANCER ANTIGEN 27.29: CA 27.29: 19

## 2011-07-06 ENCOUNTER — Encounter (INDEPENDENT_AMBULATORY_CARE_PROVIDER_SITE_OTHER): Payer: Self-pay

## 2011-09-18 ENCOUNTER — Encounter (INDEPENDENT_AMBULATORY_CARE_PROVIDER_SITE_OTHER): Payer: Self-pay | Admitting: General Surgery

## 2011-09-18 ENCOUNTER — Ambulatory Visit (INDEPENDENT_AMBULATORY_CARE_PROVIDER_SITE_OTHER): Payer: BC Managed Care – PPO | Admitting: General Surgery

## 2011-09-18 VITALS — BP 152/94 | HR 86 | Temp 98.3°F | Ht 66.5 in | Wt 165.4 lb

## 2011-09-18 DIAGNOSIS — C50919 Malignant neoplasm of unspecified site of unspecified female breast: Secondary | ICD-10-CM

## 2011-09-18 NOTE — Patient Instructions (Signed)
Continue regular self exams Continue anastrazole 

## 2011-09-25 NOTE — Progress Notes (Signed)
Subjective:     Patient ID: Angela [name], female   DOB: 09-Nov-1945, 66 y.o.   MRN: [id number]  HPI The patient is a 66 year old white female who is now 3 years out from a left breast lumpectomy and negative sentinel node biopsy with some isolated tumor cells and one of 4 of the lymph nodes. She finished radiation therapy and is now taking Arimidex. She seems to be tolerating this well. Her last mammogram showed no evidence of malignancy. Since her last visit she's had cataract surgery on her right eye.  Review of Systems  Constitutional: Negative.   HENT: Negative.   Eyes: Positive for visual disturbance.  Respiratory: Negative.   Cardiovascular: Negative.   Gastrointestinal: Negative.   Genitourinary: Negative.   Musculoskeletal: Negative.   Skin: Negative.   Neurological: Negative.   Hematological: Negative.   Psychiatric/Behavioral: Negative.        Objective:   Physical Exam  Constitutional: She is oriented to person, place, and time. She appears well-developed and well-nourished.  HENT:  Head: Normocephalic and atraumatic.  Eyes: Conjunctivae and EOM are normal. Pupils are equal, round, and reactive to light.  Neck: Normal range of motion. Neck supple.  Cardiovascular: Normal rate, regular rhythm and normal heart sounds.   Pulmonary/Chest: Effort normal and breath sounds normal.       Left breast incision is healed nicely. She has a little bit of thickness beneath this incision. Otherwise no palpable mass in either breast. No axillary supraclavicular or cervical lymphadenopathy.  Abdominal: Soft. Bowel sounds are normal. She exhibits no mass. There is no tenderness.  Musculoskeletal: Normal range of motion.  Lymphadenopathy:    She has no cervical adenopathy.  Neurological: She is alert and oriented to person, place, and time.  Skin: Skin is warm and dry.  Psychiatric: She has a normal mood and affect. Her behavior is normal.       Assessment:     3 year status post  left breast lumpectomy and negative sentinel node biopsy    Plan:     At this point she will continue to take Arimidex. She will also continue to do regular self exams. We will plan to see her back in about 6 months

## 2011-10-15 ENCOUNTER — Other Ambulatory Visit (INDEPENDENT_AMBULATORY_CARE_PROVIDER_SITE_OTHER): Payer: BC Managed Care – PPO

## 2011-10-15 DIAGNOSIS — I10 Essential (primary) hypertension: Secondary | ICD-10-CM

## 2011-10-15 DIAGNOSIS — E785 Hyperlipidemia, unspecified: Secondary | ICD-10-CM

## 2011-10-15 DIAGNOSIS — Z23 Encounter for immunization: Secondary | ICD-10-CM

## 2011-10-15 LAB — CBC WITH DIFFERENTIAL/PLATELET
Basophils Absolute: 0 10*3/uL (ref 0.0–0.1)
Eosinophils Absolute: 0.2 10*3/uL (ref 0.0–0.7)
HCT: 39.9 % (ref 36.0–46.0)
Hemoglobin: 13.6 g/dL (ref 12.0–15.0)
Lymphs Abs: 1.9 10*3/uL (ref 0.7–4.0)
MCHC: 34.2 g/dL (ref 30.0–36.0)
MCV: 93.8 fl (ref 78.0–100.0)
Monocytes Absolute: 0.6 10*3/uL (ref 0.1–1.0)
Neutro Abs: 3.1 10*3/uL (ref 1.4–7.7)
Platelets: 208 10*3/uL (ref 150.0–400.0)
RDW: 12.4 % (ref 11.5–14.6)

## 2011-10-15 LAB — BASIC METABOLIC PANEL
BUN: 17 mg/dL (ref 6–23)
CO2: 30 mEq/L (ref 19–32)
Chloride: 103 mEq/L (ref 96–112)
GFR: 73.37 mL/min (ref >60.00)
Glucose, Bld: 94 mg/dL (ref 70–99)
Potassium: 4.3 mEq/L (ref 3.5–5.1)

## 2011-10-15 LAB — POCT URINALYSIS DIPSTICK
Blood, UA: NEGATIVE
Ketones, UA: NEGATIVE
Protein, UA: NEGATIVE
Spec Grav, UA: 1.02
Urobilinogen, UA: 0.2

## 2011-10-15 LAB — HEPATIC FUNCTION PANEL
Albumin: 4.1 g/dL (ref 3.5–5.2)
Total Bilirubin: 0.3 mg/dL (ref 0.3–1.2)

## 2011-10-15 LAB — LIPID PANEL
Cholesterol: 155 mg/dL (ref 0–200)
LDL Cholesterol: 97 mg/dL (ref 0–99)
Triglycerides: 90 mg/dL (ref 0.0–149.0)
VLDL: 18 mg/dL (ref 0.0–40.0)

## 2011-10-15 LAB — TSH: TSH: 1.3 u[IU]/mL (ref 0.35–5.50)

## 2011-10-22 ENCOUNTER — Ambulatory Visit (INDEPENDENT_AMBULATORY_CARE_PROVIDER_SITE_OTHER): Payer: BC Managed Care – PPO | Admitting: Internal Medicine

## 2011-10-22 ENCOUNTER — Encounter: Payer: Self-pay | Admitting: Internal Medicine

## 2011-10-22 VITALS — BP 162/90 | HR 92 | Temp 98.1°F | Ht 66.0 in | Wt 165.0 lb

## 2011-10-22 DIAGNOSIS — Z0000 Encounter for general adult medical examination without abnormal findings: Secondary | ICD-10-CM

## 2011-10-22 DIAGNOSIS — I10 Essential (primary) hypertension: Secondary | ICD-10-CM

## 2011-10-22 DIAGNOSIS — C50919 Malignant neoplasm of unspecified site of unspecified female breast: Secondary | ICD-10-CM

## 2011-10-22 NOTE — Assessment & Plan Note (Signed)
Well controlled at home- continue same meds 

## 2011-10-22 NOTE — Assessment & Plan Note (Signed)
Has regular f/u with oncology and Dr. Carolynne Edouard.

## 2011-10-22 NOTE — Progress Notes (Signed)
Patient ID: Angela [name], female   DOB: 07/30/1946, 66 y.o.   MRN: [id number] CPX Home BPs 110s/70s (my bp is always up at drs. Office)  Past Medical History  Diagnosis Date  . Hyperlipidemia   . Osteopenia   . BCC (basal cell carcinoma)   . Breast cancer 2009  . Inflammation of bone current    foot, in a boot for 6 weeks  . Hypertension   . Arthritis     mild  . Arthritis pain     History   Social History  . Marital Status: Married    Spouse Name: N/A    Number of Children: N/A  . Years of Education: N/A   Occupational History  . retired     works 1 day a week   Social History Main Topics  . Smoking status: Never Smoker   . Smokeless tobacco: Never Used  . Alcohol Use: Yes     3 glasses of wine a week  . Drug Use: No  . Sexually Active: Not on file   Other Topics Concern  . Not on file   Social History Narrative  . No narrative on file    Past Surgical History  Procedure Date  . Bcc equired excision   . Tonsillectomy   . Breast lumpectomy 10/09    breast cancer  . Mass excision 7/09    appendix    Family History  Problem Relation Age of Onset  . Heart attack Mother   . Hypertension Mother   . Heart disease Father   . Heart attack Father   . Hypertension Father     No Known Allergies  Current Outpatient Prescriptions on File Prior to Visit  Medication Sig Dispense Refill  . anastrozole (ARIMIDEX) 1 MG tablet Take 1 mg by mouth daily.       . Ascorbic Acid (VITAMIN C) 500 MG tablet Take 500 mg by mouth daily.        Marland Kitchen aspirin 81 MG tablet Take 81 mg by mouth daily.        . Calcium Carbonate-Vitamin D (CALTRATE 600+D) 600-400 MG-UNIT per tablet Take 1 tablet by mouth daily.        . Cholecalciferol (VITAMIN D3) 1000 UNIT capsule Take 1,000 Units by mouth daily.        . hydrocortisone (ANUSOL-HC) 25 MG suppository Place 1 suppository (25 mg total) rectally every 12 (twelve) hours.  12 suppository  1  . ketorolac (ACULAR) 0.5 % ophthalmic  solution 1 drop 4 (four) times daily. 0.5%       . lisinopril (PRINIVIL,ZESTRIL) 20 MG tablet TAKE 1 TABLET BY MOUTH ONCE DAILY  60 tablet  4  . Melatonin 3 MG CAPS Take by mouth 1 day or 1 dose.        . Multiple Vitamin (MULTIVITAMIN) tablet Take 1 tablet by mouth daily.        . Omega-3 350 MG CAPS Take 350 mg by mouth daily. 3 tabs po once daily       . simvastatin (ZOCOR) 40 MG tablet Take 1 tablet (40 mg total) by mouth at bedtime.  90 tablet  3  . ALPRAZolam (XANAX) 0.5 MG tablet Take 0.5 mg by mouth every other day. Prn for anxiety          patient denies chest pain, shortness of breath, orthopnea. Denies lower extremity edema, abdominal pain, change in appetite, change in bowel movements. Patient denies rashes, musculoskeletal complaints. No other  specific complaints in a complete review of systems.   BP 162/90  Pulse 92  Temp(Src) 98.1 F (36.7 C) (Oral)  Ht 5\' 6"  (1.676 m)  Wt 165 lb (74.844 kg)  BMI 26.63 kg/m2  Well-developed well-nourished female in no acute distress. HEENT exam atraumatic, normocephalic, extraocular muscles are intact. Neck is supple. No jugular venous distention no thyromegaly. Chest clear to auscultation without increased work of breathing. Cardiac exam S1 and S2 are regular. Abdominal exam active bowel sounds, soft, nontender. Extremities no edema. Neurologic exam she is alert without any motor sensory deficits. Gait is normal.   Well visit- health maint UTD.

## 2011-10-26 ENCOUNTER — Telehealth: Payer: Self-pay | Admitting: Internal Medicine

## 2011-10-26 MED ORDER — LISINOPRIL 20 MG PO TABS: 20.0000 mg | ORAL_TABLET | Freq: Every day | ORAL | Status: DC

## 2011-10-26 NOTE — Telephone Encounter (Signed)
Patient needs an rx refill on her lisinopril called into [facility], [place] as she is out of refills. Please assist.

## 2011-12-04 ENCOUNTER — Other Ambulatory Visit: Payer: Self-pay | Admitting: Hematology and Oncology

## 2011-12-04 DIAGNOSIS — Z853 Personal history of malignant neoplasm of breast: Secondary | ICD-10-CM

## 2011-12-04 DIAGNOSIS — Z9889 Other specified postprocedural states: Secondary | ICD-10-CM

## 2011-12-14 ENCOUNTER — Emergency Department (HOSPITAL_COMMUNITY)
Admission: EM | Admit: 2011-12-14 | Discharge: 2011-12-14 | Disposition: A | Discharge status: Home / Self Care | Payer: Medicare Other | Attending: Emergency Medicine | Admitting: Emergency Medicine

## 2011-12-14 ENCOUNTER — Emergency Department (HOSPITAL_COMMUNITY): Payer: Medicare Other

## 2011-12-14 ENCOUNTER — Encounter (HOSPITAL_COMMUNITY): Payer: Self-pay | Admitting: *Deleted

## 2011-12-14 ENCOUNTER — Other Ambulatory Visit: Payer: Self-pay

## 2011-12-14 DIAGNOSIS — I10 Essential (primary) hypertension: Secondary | ICD-10-CM | POA: Insufficient documentation

## 2011-12-14 DIAGNOSIS — E785 Hyperlipidemia, unspecified: Secondary | ICD-10-CM | POA: Insufficient documentation

## 2011-12-14 DIAGNOSIS — R079 Chest pain, unspecified: Secondary | ICD-10-CM | POA: Insufficient documentation

## 2011-12-14 DIAGNOSIS — Z79899 Other long term (current) drug therapy: Secondary | ICD-10-CM | POA: Insufficient documentation

## 2011-12-14 DIAGNOSIS — Z8739 Personal history of other diseases of the musculoskeletal system and connective tissue: Secondary | ICD-10-CM | POA: Insufficient documentation

## 2011-12-14 LAB — CBC
HCT: 43.8 % (ref 36.0–46.0)
Hemoglobin: 15.1 g/dL — ABNORMAL HIGH (ref 12.0–15.0)
MCHC: 34.5 g/dL (ref 30.0–36.0)
MCV: 91.6 fL (ref 78.0–100.0)
RDW: 11.9 % (ref 11.5–15.5)
WBC: 9.6 10*3/uL (ref 4.0–10.5)

## 2011-12-14 LAB — URINALYSIS, ROUTINE W REFLEX MICROSCOPIC
Glucose, UA: NEGATIVE mg/dL
pH: 6.5 (ref 5.0–8.0)

## 2011-12-14 LAB — POCT I-STAT, CHEM 8
Calcium, Ion: 1.28 mmol/L (ref 1.12–1.32)
Chloride: 106 mEq/L (ref 96–112)
Creatinine, Ser: 0.8 mg/dL (ref 0.50–1.10)
Glucose, Bld: 106 mg/dL — ABNORMAL HIGH (ref 70–99)
Potassium: 3.7 mEq/L (ref 3.5–5.1)

## 2011-12-14 LAB — URINE MICROSCOPIC-ADD ON

## 2011-12-14 MED ORDER — NITROGLYCERIN 0.4 MG SL SUBL: 0.4000 mg | SUBLINGUAL_TABLET | SUBLINGUAL | Status: DC | PRN

## 2011-12-14 NOTE — Discharge Instructions (Signed)
Chest Pain (Nonspecific) Chest pain has many causes. Your pain could be caused by something serious, such as a heart attack or a blood clot in the lungs. It could also be caused by something less serious, such as a chest bruise or a virus. Follow up with your doctor. More lab tests or other studies may be needed to find the cause of your pain. Most of the time, nonspecific chest pain will improve within 2 to 3 days of rest and mild pain medicine. HOME CARE  For chest bruises, you may put ice on the sore area for 15 to 20 minutes, 3 to 4 times a day. Do this only if it makes you or your child feel better.   Put ice in a plastic bag.   Place a towel between the skin and the bag.   Rest for the next 2 to 3 days.   Go back to work if the pain improves.   See your doctor if the pain lasts longer than 1 to 2 weeks.   Only take medicine as told by your doctor.   Quit smoking if you smoke.  GET HELP RIGHT AWAY IF:   There is more pain or pain that spreads to the arm, neck, jaw, back, or belly (abdomen).   You or your child has shortness of breath.   You or your child coughs more than usual or coughs up blood.   You or your child has very bad back or belly pain, feels sick to his or her stomach (nauseous), or throws up (vomits).   You or your child has very bad weakness.   You or your child passes out (faints).   You or your child has a temperature by mouth above 102 F (38.9 C), not controlled by medicine.  Any of these problems may be serious and may be an emergency. Do not wait to see if the problems will go away. Get medical help right away. Call your local emergency services 911 in [place] Do not drive yourself to the hospital. MAKE SURE YOU:   Understand these instructions.   Will watch this condition.   Will get help right away if you or your child is not doing well or gets worse.  Document Released: 02/13/2008 Document Revised: 08/16/2011 Document Reviewed:  02/13/2008 [employer] Patient Information 2012 [employer]. 

## 2011-12-14 NOTE — ED Provider Notes (Signed)
History     CSN: [id number]  Arrival date & time 12/14/11  1745   First MD Initiated Contact with Patient 12/14/11 2038      Chief Complaint  Patient presents with  . Chest Pain     HPI To ED for eval of chest pain over the past cple days. States at first pain was on both sides of chest and now it's pinpoint in left chest and is more consistent today. No nausea or vomiting. No diaphoresis.  Patient states that she is pain-free at the present time.  She said the pain only lasts briefly maybe 3-5 minutes.  She has no known cardiac history.  Patient has hypertension and hyperlipidemia as risk factors.  Both parents had coronary disease but later on in light.  Past Medical History  Diagnosis Date  . Hyperlipidemia   . Osteopenia   . BCC (basal cell carcinoma)   . Breast cancer 2009  . Inflammation of bone current    foot, in a boot for 6 weeks  . Hypertension   . Arthritis     mild  . Arthritis pain     Past Surgical History  Procedure Date  . Bcc equired excision   . Tonsillectomy   . Breast lumpectomy 10/09    breast cancer  . Mass excision 7/09    appendix  . [facility]    Family History  Problem Relation Age of Onset  . Heart attack Mother   . Hypertension Mother   . Heart disease Father   . Heart attack Father   . Hypertension Father     History  Substance Use Topics  . Smoking status: Never Smoker   . Smokeless tobacco: Never Used  . Alcohol Use: Yes     3 glasses of wine a week    OB History    Grav Para Term Preterm Abortions TAB SAB Ect Mult Living                  Review of Systems  All other systems reviewed and are negative.    Allergies  Review of patient's allergies indicates no known allergies.  Home Medications   Current Outpatient Rx  Name Route Sig Dispense Refill  . ANASTROZOLE 1 MG PO TABS Oral Take 1 mg by mouth daily.     Marland Kitchen VITAMIN C 500 MG PO TABS Oral Take 500 mg by mouth daily.      .  ASPIRIN 81 MG PO TABS Oral Take 81 mg by mouth daily.      Marland Kitchen CALCIUM CARBONATE-VITAMIN D 600-400 MG-UNIT PO TABS Oral Take 1 tablet by mouth daily.      . CHOLECALCIFEROL 1000 UNITS PO CAPS Oral Take 1,000 Units by mouth daily.      Marland Kitchen KETOROLAC TROMETHAMINE 0.5 % OP SOLN Right Eye Place 1 drop into the right eye 4 (four) times daily. 0.5%    . LISINOPRIL 20 MG PO TABS Oral Take 20 mg by mouth daily.    Marland Kitchen ONE-DAILY MULTI VITAMINS PO TABS Oral Take 1 tablet by mouth daily.      . OMEGA-3-ACID ETHYL ESTERS 1 G PO CAPS Oral Take 1 g by mouth daily.    Marland Kitchen SIMVASTATIN 40 MG PO TABS Oral Take 40 mg by mouth at bedtime.    Marland Kitchen NITROGLYCERIN 0.4 MG SL SUBL Sublingual Place 1 tablet (0.4 mg total) under the tongue every 5 (five) minutes as needed  for chest pain. 30 tablet 0    BP 192/103  Pulse 120  Temp(Src) 98.4 F (36.9 C) (Oral)  Resp 18  SpO2 99%  Physical Exam  Nursing note and vitals reviewed. Constitutional: She is oriented to person, place, and time. She appears well-developed and well-nourished. No distress.  HENT:  Head: Normocephalic and atraumatic.  Eyes: Pupils are equal, round, and reactive to light.  Neck: Normal range of motion.  Cardiovascular: Normal rate and intact distal pulses.        Heart rate equals 109 Sinus tachycardia with occasional Premature ventricular complexes Otherwise normal ECG No significant change was found  Pulmonary/Chest: No respiratory distress. She has no wheezes.  Abdominal: Normal appearance. She exhibits no distension.  Musculoskeletal: Normal range of motion.  Neurological: She is alert and oriented to person, place, and time. No cranial nerve deficit.  Skin: Skin is warm and dry. No rash noted.  Psychiatric: She has a normal mood and affect. Her behavior is normal.    ED Course  Procedures (including critical care time)  Labs Reviewed  CBC - Abnormal; Notable for the following:    Hemoglobin 15.1 (*)    All other components within normal  limits  POCT I-STAT, CHEM 8 - Abnormal; Notable for the following:    Glucose, Bld 106 (*)    Hemoglobin 15.3 (*)    All other components within normal limits  URINALYSIS, ROUTINE W REFLEX MICROSCOPIC - Abnormal; Notable for the following:    Leukocytes, UA TRACE (*)    All other components within normal limits  POCT I-STAT TROPONIN I  URINE MICROSCOPIC-ADD ON   Dg Chest 2 View  12/14/2011  *RADIOLOGY REPORT*  Clinical Data: Chest pain  CHEST - 2 VIEW  Comparison: 03/22/2008  Findings: Cardiomediastinal silhouette is stable.  Right paratracheal soft tissue prominence is stable in appearance from prior exam probable benign in nature.  No acute infiltrate or pleural effusion.  No pulmonary edema.  Stable mild degenerative changes mid thoracic spine.  IMPRESSION: No active disease.  No significant change.  Original Report Authenticated By: Natasha Mead, M.D.     1. Chest pain   2. HYPERTENSION   3. HYPERLIPIDEMIA       MDM  Discussed the case with the lobar cardiology.  Followup appointment will be made early next week for or provocative testing.  Patient will be discharged with prescription for nitroglycerin and told to return if her pain comes back and is persistent.       Nelia Shi, MD 12/14/11 2150

## 2011-12-14 NOTE — ED Notes (Signed)
To ED for eval of chest pain over the past cple days. States at first pain was on both sides of chest and now it's pinpoint in left chest and is more consistent today. No nausea or vomiting. No diaphoresis.

## 2011-12-19 ENCOUNTER — Encounter: Payer: BC Managed Care – PPO | Admitting: Physician Assistant

## 2011-12-21 ENCOUNTER — Encounter: Payer: Self-pay | Admitting: Cardiovascular Disease

## 2011-12-21 ENCOUNTER — Ambulatory Visit (INDEPENDENT_AMBULATORY_CARE_PROVIDER_SITE_OTHER): Payer: Medicare Other | Admitting: Cardiovascular Disease

## 2011-12-21 ENCOUNTER — Ambulatory Visit: Payer: BC Managed Care – PPO | Admitting: Cardiology

## 2011-12-21 DIAGNOSIS — R079 Chest pain, unspecified: Secondary | ICD-10-CM

## 2011-12-21 NOTE — Progress Notes (Signed)
Angela [name] Date of Birth  1946/09/03 [facility]     [place] [facility]  [address]    [address]   [address] [place], [place]  [address]    [place], [place]  [address] [phone]  Fax  [phone]  [phone]  Fax [phone]  Problem List: 1. Hypertension 2. Chest pain 3. Breast cancer ( left) - s/p XRT, lumpectomy  History of Present Illness:  Angela [name] is a 66 yo seen for further eval of chest pain.  She traveled home from [place] on Friday.  She had been caring for her grandchildren for the week and had been carrying a back pack.  The pain is a "random" chest pain.  Seems to move across her chest . Not related to eating, twisting, turning, deep breath. She swims regularly and not had any chest pain.   The pains are described as a tightness lasting 10-15 minutes.  On Friday, the pains lasted off and on all day long.  Current Outpatient Prescriptions on File Prior to Visit  Medication Sig Dispense Refill  . anastrozole (ARIMIDEX) 1 MG tablet Take 1 mg by mouth daily.       . Ascorbic Acid (VITAMIN C) 500 MG tablet Take 500 mg by mouth daily.        Marland Kitchen aspirin 81 MG tablet Take 81 mg by mouth daily.        . Calcium Carbonate-Vitamin D (CALTRATE 600+D) 600-400 MG-UNIT per tablet Take 2 tablets by mouth daily.       Marland Kitchen ketorolac (ACULAR) 0.5 % ophthalmic solution Place 1 drop into the right eye 4 (four) times daily. 0.5%      . lisinopril (PRINIVIL,ZESTRIL) 20 MG tablet Take 20 mg by mouth daily.      . Multiple Vitamin (MULTIVITAMIN) tablet Take 1 tablet by mouth daily.        . nitroGLYCERIN (NITROSTAT) 0.4 MG SL tablet Place 1 tablet (0.4 mg total) under the tongue every 5 (five) minutes as needed for chest pain.  30 tablet  0  . omega-3 acid ethyl esters (LOVAZA) 1 G capsule Take 1 g by mouth daily.      . simvastatin (ZOCOR) 40 MG tablet Take 40 mg by mouth at bedtime.        No Known Allergies  Past Medical History  Diagnosis Date  . Hyperlipidemia   .  Osteopenia   . BCC (basal cell carcinoma)   . Breast cancer 2009  . Inflammation of bone current    foot, in a boot for 6 weeks  . Hypertension   . Arthritis     mild  . Arthritis pain     Past Surgical History  Procedure Date  . Bcc equired excision   . Tonsillectomy   . Breast lumpectomy 10/09    breast cancer  . Mass excision 7/09    appendix  . [facility]aract [facility]    History  Smoking status  . Never Smoker   Smokeless tobacco  . Never Used    History  Alcohol Use  . Yes    3 glasses of wine a week    Family History  Problem Relation Age of Onset  . Heart attack Mother   . Hypertension Mother   . Heart disease Father   . Heart attack Father   . Hypertension Father     Reviw of Systems:  Reviewed in the HPI.  All  other systems are negative.  Physical Exam: Blood pressure 132/86, pulse 86, height 5\' 6"  (1.676 m), weight 164 lb (74.39 kg). General: Well developed, well nourished, in no acute distress.  Head: Normocephalic, atraumatic, sclera non-icteric, mucus membranes are moist,   Neck: Supple. Carotids are 2 + without bruits. No JVD  Lungs: Clear bilaterally to auscultation.  Heart: regular rate.  normal  S1 S2. No murmurs, gallops or rubs.  Abdomen: Soft, non-tender, non-distended with normal bowel sounds. No hepatomegaly. No rebound/guarding. No masses.  Msk:  Strength and tone are normal  Extremities: No clubbing or cyanosis. No edema.  Distal pedal pulses are 2+ and equal bilaterally.  Neuro: Alert and oriented X 3. Moves all extremities spontaneously.  Psych:  Responds to questions appropriately with a normal affect.  ECG: 12/21/11- NSR, normal ECG  Assessment / Plan:

## 2011-12-21 NOTE — Assessment & Plan Note (Signed)
Kathia presents with episodes of chest pain. These chest pains are somewhat concerning. She has a history of hypertension and hyperlipidemia. She also has a family history of coronary artery disease.  His pains are described as a chest pressure.  It radiated across her chest and into her axilla bilaterally. There is no radiation into the neck. They're not associated with shortness of breath or diaphoresis.  She was seen in the hospital this past weekend with a prolonged episode of chest discomfort. She had negative cardiac enzymes at that time. Dr. Radford Pax offered to admit her for overnight observation but she declined and wanted to have her stress test performed as an outpatient.  I think it is reasonable to proceed with a stress Myoview study. She has several risk factors and new onset chest pain that does not appear to be due to musculoskeletal pain.  She'll be leaving for [place] in a week or so. Will be important to get these issues resolved prior to her trip to [place].

## 2011-12-21 NOTE — Patient Instructions (Signed)
Your physician has requested that you have an exercise stress myoview early next week. For further information please visit [email or web address] Please follow instruction sheet, as given.  Your physician recommends that you schedule a follow-up appointment as needed with Dr. Elease Hashimoto.

## 2011-12-24 ENCOUNTER — Other Ambulatory Visit: Payer: Self-pay | Admitting: *Deleted

## 2011-12-25 ENCOUNTER — Encounter (HOSPITAL_COMMUNITY)
Admission: RE | Admit: 2011-12-25 | Discharge: 2011-12-25 | Disposition: A | Discharge status: Home / Self Care | Payer: Medicare Other | Source: Ambulatory Visit | Attending: Cardiovascular Disease | Admitting: Cardiovascular Disease

## 2011-12-25 ENCOUNTER — Ambulatory Visit (INDEPENDENT_AMBULATORY_CARE_PROVIDER_SITE_OTHER): Payer: Medicare Other

## 2011-12-25 ENCOUNTER — Encounter (HOSPITAL_COMMUNITY): Payer: Self-pay

## 2011-12-25 DIAGNOSIS — R079 Chest pain, unspecified: Secondary | ICD-10-CM

## 2011-12-25 DIAGNOSIS — R0789 Other chest pain: Secondary | ICD-10-CM | POA: Insufficient documentation

## 2011-12-25 MED ORDER — TECHNETIUM TC 99M TETROFOSMIN IV KIT
10.0000 | PACK | Freq: Once | INTRAVENOUS | Status: AC | PRN
  Administered 2011-12-25: 11 via INTRAVENOUS

## 2011-12-25 MED ORDER — TECHNETIUM TC 99M TETROFOSMIN IV KIT
30.0000 | PACK | Freq: Once | INTRAVENOUS | Status: AC | PRN
  Administered 2011-12-25: 28 via INTRAVENOUS

## 2011-12-25 NOTE — Progress Notes (Signed)
Stress Lab Nurses Notes - Angela [name]  Angela [name] 12/25/2011 Reason for doing test: Chest Pain Type of test: Stress Myoview Nurse performing test: Parke Poisson, RN Nuclear Medicine Tech: Lou Cal Echo Tech: Not Applicable MD performing test: R. Dietrich Pates Family MD: [name], MD Test explained and consent signed: yes IV started: 22g jelco, Saline lock flushed, No redness or edema and Saline lock started in radiology Symptoms: Fatigue Treatment/Intervention: None Reason test stopped: fatigue and reached target HR After recovery IV was: Discontinued via X-ray tech and No redness or edema Patient to return to Nuc. Med at :  13:35 Patient discharged: Home Patient's Condition upon discharge was: stable Comments: During test peak BP 198/90 & HR 151.  Recovery BP 142/80 & HR 98.  Symptoms resolved in recovery. Erskine Speed T

## 2011-12-27 ENCOUNTER — Telehealth: Payer: Self-pay | Admitting: Cardiology

## 2011-12-27 NOTE — Progress Notes (Signed)
Pt was notified of results

## 2011-12-27 NOTE — Telephone Encounter (Signed)
Results of myoview were given to pt.

## 2011-12-27 NOTE — Telephone Encounter (Signed)
Fu call °Patient returning your call °

## 2011-12-27 NOTE — Telephone Encounter (Signed)
Pt said she was told she would be called right back , and didn't hear anything yet, its been about 15 minutes, told debby at lunch , will have her call back this pm, can be reached until 400p

## 2012-02-11 ENCOUNTER — Encounter (INDEPENDENT_AMBULATORY_CARE_PROVIDER_SITE_OTHER): Payer: Self-pay | Admitting: General Surgery

## 2012-03-10 ENCOUNTER — Encounter: Payer: Self-pay | Admitting: Internal Medicine

## 2012-03-10 ENCOUNTER — Ambulatory Visit (INDEPENDENT_AMBULATORY_CARE_PROVIDER_SITE_OTHER): Payer: Medicare Other | Admitting: Internal Medicine

## 2012-03-10 VITALS — BP 120/82 | Temp 98.2°F | Wt 164.0 lb

## 2012-03-10 DIAGNOSIS — I10 Essential (primary) hypertension: Secondary | ICD-10-CM

## 2012-03-10 DIAGNOSIS — R109 Unspecified abdominal pain: Secondary | ICD-10-CM

## 2012-03-10 DIAGNOSIS — C50919 Malignant neoplasm of unspecified site of unspecified female breast: Secondary | ICD-10-CM

## 2012-03-10 NOTE — Patient Instructions (Signed)
Call if pain fails to improve or worsens General surgery followup in 2 weeks as scheduled

## 2012-03-10 NOTE — Progress Notes (Signed)
Subjective:    Patient ID: KMARI BRIAN, female    DOB: 1946-01-14, 66 y.o.   MRN: [id number]  HPI  66 year old patient who has a history of breast cancer who presents with a two-week history of right lower quadrant pain. 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 She had an abdominal and pelvic CT scan at that time. For the past 2 weeks he describes a very mild discomfort in the right lower cause her. At times seems to be improved by a BM but generally no aggravating or precipitating factors. Pain is fleeting and not associated with nausea vomiting or change in her bowel habits. There has been no abdominal distention. She is scheduled for Gen. surgery followup in 2 weeks. She is scheduled for oncology followup in September.  Office records reviewed  Past Medical History  Diagnosis Date  . Hyperlipidemia   . Osteopenia   . BCC (basal cell carcinoma)   . Breast cancer 2009  . Inflammation of bone current    foot, in a boot for 6 weeks  . Hypertension   . Arthritis     mild  . Arthritis pain     History   Social History  . Marital Status: Married    Spouse Name: N/A    Number of Children: N/A  . Years of Education: N/A   Occupational History  . retired     works 1 day a week   Social History Main Topics  . Smoking status: Never Smoker   . Smokeless tobacco: Never Used  . Alcohol Use: Yes     3 glasses of wine a week  . Drug Use: No  . Sexually Active: Not on file   Other Topics Concern  . Not on file   Social History Narrative  . No narrative on file    Past Surgical History  Procedure Date  . Bcc equired excision   . Tonsillectomy   . Breast lumpectomy 10/09    breast cancer  . Mass excision 7/09    appendix  . Cataract extraction 2012    [facility]    Family History  Problem Relation Age of Onset  . Heart attack Mother   . Hypertension Mother   . Heart disease Father   .  Heart attack Father   . Hypertension Father     No Known Allergies  Current Outpatient Prescriptions on File Prior to Visit  Medication Sig Dispense Refill  . anastrozole (ARIMIDEX) 1 MG tablet Take 1 mg by mouth daily.       . Ascorbic Acid (VITAMIN C) 500 MG tablet Take 500 mg by mouth daily.        Marland Kitchen aspirin 81 MG tablet Take 81 mg by mouth daily.        . Calcium Carbonate-Vitamin D (CALTRATE 600+D) 600-400 MG-UNIT per tablet Take 2 tablets by mouth daily.       . cholecalciferol (VITAMIN D) 400 UNITS TABS Take 400 Units by mouth daily.      Marland Kitchen ketorolac (ACULAR) 0.5 % ophthalmic solution Place 1 drop into the right eye 4 (four) times daily. 0.5%      . lisinopril (PRINIVIL,ZESTRIL) 20 MG tablet Take 20 mg by mouth daily.      . Multiple Vitamin (MULTIVITAMIN) tablet Take 1 tablet by mouth daily.        . nitroGLYCERIN (NITROSTAT) 0.4 MG SL tablet Place 1  tablet (0.4 mg total) under the tongue every 5 (five) minutes as needed for chest pain.  30 tablet  0  . omega-3 acid ethyl esters (LOVAZA) 1 G capsule Take 1 g by mouth daily.      . simvastatin (ZOCOR) 40 MG tablet Take 40 mg by mouth at bedtime.        BP 120/82  Temp 98.2 F (36.8 C) (Oral)  Wt 164 lb (74.39 kg)       Review of Systems  Constitutional: Negative.   HENT: Negative for hearing loss, congestion, sore throat, rhinorrhea, dental problem, sinus pressure and tinnitus.   Eyes: Negative for pain, discharge and visual disturbance.  Respiratory: Negative for cough and shortness of breath.   Cardiovascular: Negative for chest pain, palpitations and leg swelling.  Gastrointestinal: Positive for abdominal pain. Negative for nausea, vomiting, diarrhea, constipation, blood in stool and abdominal distention.  Genitourinary: Negative for dysuria, urgency, frequency, hematuria, flank pain, vaginal bleeding, vaginal discharge, difficulty urinating, vaginal pain and pelvic pain.  Musculoskeletal: Negative for joint swelling,  arthralgias and gait problem.  Skin: Negative for rash.  Neurological: Negative for dizziness, syncope, speech difficulty, weakness, numbness and headaches.  Hematological: Negative for adenopathy.  Psychiatric/Behavioral: Negative for behavioral problems, dysphoric mood and agitation. The patient is not nervous/anxious.        Objective:   Physical Exam  Constitutional: She appears well-developed and well-nourished. No distress.       Blood pressure 130/86  Abdominal: Soft. Bowel sounds are normal. She exhibits no distension and no mass. There is no tenderness. There is no rebound and no guarding.       Surgical scar right mid abdominal region          Assessment & Plan:   Nonspecific abdominal pain. Symptoms seem quite mild. May be related to adhesions. Options discussed at this time. Patient does have a general surgical followup in 2 weeks but if her pain worsens or fails to improve we'll consider early CT of the abdomen and pelvis.  Patient does have a history of breast cancer treated with lumpectomy and RT Hypertension stable. Patient does complain of some mild dizziness. Will continue to observe  Follow general surgery 2 weeks Return here as needed if unimproved or clinical worsening

## 2012-03-28 ENCOUNTER — Ambulatory Visit (INDEPENDENT_AMBULATORY_CARE_PROVIDER_SITE_OTHER): Payer: Medicare Other | Admitting: General Surgery

## 2012-03-28 ENCOUNTER — Encounter (INDEPENDENT_AMBULATORY_CARE_PROVIDER_SITE_OTHER): Payer: Self-pay | Admitting: General Surgery

## 2012-03-28 VITALS — BP 127/77 | HR 80 | Temp 98.1°F | Resp 16 | Ht 66.0 in | Wt 162.4 lb

## 2012-03-28 DIAGNOSIS — C50919 Malignant neoplasm of unspecified site of unspecified female breast: Secondary | ICD-10-CM

## 2012-03-28 NOTE — Patient Instructions (Signed)
Continue arimidex Continue regular self exams Rest abd and call if not any better in a couple weeks

## 2012-04-04 ENCOUNTER — Encounter (INDEPENDENT_AMBULATORY_CARE_PROVIDER_SITE_OTHER): Payer: Self-pay | Admitting: General Surgery

## 2012-04-04 NOTE — Progress Notes (Signed)
Subjective:     Patient ID: Angela [name], female   DOB: 07/02/1946, 66 y.o.   MRN: [id number]  HPI The patient is a 66 year old white female who is 3-1/2 years out from a left breast lumpectomy and sentinel node biopsy with isolated tumor cells and one of 4 lymph nodes. She is taken Arimidex and tolerated that well. Her only complaint is of some right lower quadrant pain about 2 months ago but this has resolved.  Review of Systems  Constitutional: Negative.   HENT: Negative.   Eyes: Negative.   Respiratory: Negative.   Cardiovascular: Negative.   Gastrointestinal: Negative.   Genitourinary: Negative.   Musculoskeletal: Negative.   Skin: Negative.   Neurological: Negative.   Hematological: Negative.   Psychiatric/Behavioral: Negative.        Objective:   Physical Exam  Constitutional: She is oriented to person, place, and time. She appears well-developed and well-nourished.  HENT:  Head: Normocephalic and atraumatic.  Eyes: Conjunctivae and EOM are normal. Pupils are equal, round, and reactive to light.  Neck: Normal range of motion. Neck supple.  Cardiovascular: Normal rate, regular rhythm and normal heart sounds.   Pulmonary/Chest: Effort normal and breath sounds normal.       There is no palpable mass in either breast. No palpable axillary supraclavicular or cervical lymphadenopathy.  Abdominal: Soft. Bowel sounds are normal.  Musculoskeletal: Normal range of motion.  Lymphadenopathy:    She has no cervical adenopathy.  Neurological: She is alert and oriented to person, place, and time.  Skin: Skin is warm and dry.  Psychiatric: She has a normal mood and affect. Her behavior is normal.       Assessment:     3-1/2 years status post left breast lumpectomy    Plan:     At this point she will continue to take Arimidex. She will continue do regular self exams. We will plan to see her back in about 8 months.

## 2012-05-01 ENCOUNTER — Telehealth: Payer: Self-pay | Admitting: Gastroenterology

## 2012-05-01 NOTE — Telephone Encounter (Signed)
Pt states she has been having diarrhea/loose stools for the past 3 weeks. States she has had a lot of cramping and abdominal pain also. Requesting to be see. Pt scheduled to see Willette Cluster NP 05/05/12@10am . Pt aware of appt date and time.

## 2012-05-05 ENCOUNTER — Encounter: Payer: Self-pay | Admitting: Nurse Practitioner

## 2012-05-05 ENCOUNTER — Ambulatory Visit (INDEPENDENT_AMBULATORY_CARE_PROVIDER_SITE_OTHER): Payer: Medicare Other | Admitting: Nurse Practitioner

## 2012-05-05 ENCOUNTER — Other Ambulatory Visit (INDEPENDENT_AMBULATORY_CARE_PROVIDER_SITE_OTHER): Payer: Medicare Other

## 2012-05-05 VITALS — BP 150/80 | HR 64 | Ht 66.0 in | Wt 163.2 lb

## 2012-05-05 DIAGNOSIS — R197 Diarrhea, unspecified: Secondary | ICD-10-CM

## 2012-05-05 DIAGNOSIS — R198 Other specified symptoms and signs involving the digestive system and abdomen: Secondary | ICD-10-CM

## 2012-05-05 LAB — CBC
RBC: 4.61 Mil/uL (ref 3.87–5.11)
WBC: 6.4 10*3/uL (ref 4.5–10.5)

## 2012-05-05 LAB — COMPREHENSIVE METABOLIC PANEL
Albumin: 4.4 g/dL (ref 3.5–5.2)
Alkaline Phosphatase: 79 U/L (ref 39–117)
CO2: 29 mEq/L (ref 19–32)
GFR: 122.91 mL/min (ref >60.00)
Glucose, Bld: 92 mg/dL (ref 70–99)
Potassium: 4.1 mEq/L (ref 3.5–5.1)
Sodium: 140 mEq/L (ref 135–145)
Total Protein: 7.2 g/dL (ref 6.0–8.3)

## 2012-05-05 MED ORDER — METRONIDAZOLE 250 MG PO TABS: 250.0000 mg | ORAL_TABLET | Freq: Three times a day (TID) | ORAL | Status: AC

## 2012-05-05 NOTE — Patient Instructions (Addendum)
Please go to the basement level to have your labs drawn.  We sent a prescription for Flagyl ( Metronidazole) to [facility], [place], [place], [place].  Call us with a progress report when you are done with the antibiotic.

## 2012-05-06 DIAGNOSIS — R197 Diarrhea, unspecified: Secondary | ICD-10-CM | POA: Insufficient documentation

## 2012-05-06 NOTE — Progress Notes (Signed)
Reviewed and agree with management plan. Yanin Muhlestein T. Nikolette Reindl MD FACG 

## 2012-05-06 NOTE — Progress Notes (Signed)
Angela [name] [id number] July 13, 1946   HISTORY OR PRESENT ILLNESS :  Patient is a 66 year old female known to Dr. Arlyce Dice. She had an appendectomy with a partial cecectomy in 2009 for an appendiceal mass. Path c/w with normal colonic mucosa. She was last seen July of last year and that was for evaluation of limited rectal bleeding which was felt to be secondary to hemorrhoids. Patient comes in today with a 3 week history of crampy diarrhea. No recent travel. No sick contacts. No recent antibiotics. No fevers.    Current Medications, Allergies, Past Medical History, Past Surgical History, Family History and Social History were reviewed in [employer] record.   PHYSICAL EXAMINATION : General:  Well developed  female in no acute distress Head: Normocephalic and atraumatic Eyes:  sclerae anicteric,conjunctive pink. Ears: Normal auditory acuity Neck: Supple, no masses.  Lungs: Clear throughout to auscultation Heart: Regular rate and rhythm Abdomen: Soft, nondistended, mild diffuse lower abdominal tenderness.  No masses or hepatomegaly noted. Normal bowel sounds Musculoskeletal: Symmetrical with no gross deformities  Skin: No lesions on visible extremities Extremities: No edema or deformities noted Neurological: Oriented x 4, grossly nonfocal Cervical Nodes:  No significant cervical adenopathy Psychological:  Alert and cooperative. Normal mood and affect  ASSESSMENT AND PLAN :  acute crampy diarrhea of 3 weeks' duration. Rule out infectious etiology. Will check some basic labs, stool studies. Following submission of stool studies patient will be treated empirically with Flagyl. We will call patient with test results and further recommendations based on those results.

## 2012-05-16 ENCOUNTER — Telehealth: Payer: Self-pay | Admitting: Gastroenterology

## 2012-05-16 DIAGNOSIS — R197 Diarrhea, unspecified: Secondary | ICD-10-CM

## 2012-05-16 NOTE — Telephone Encounter (Signed)
Angela [name] pt see by Willette Cluster NP 05/05/12 for diarrhea. Labwork ordered and pt was placed on Flagyl. Pt has completed the Flagyl and states she is not any better, she is still having diarrhea. Dr. Juanda Chance as doc of the day please advise.

## 2012-05-16 NOTE — Telephone Encounter (Signed)
See other telephone note.  

## 2012-05-16 NOTE — Telephone Encounter (Signed)
Left message for pt to call back  °

## 2012-05-17 NOTE — Telephone Encounter (Signed)
Bonita Quin, this message came at 1.40 pm , and it  should have been taken care of the same day, if I don't answer right away because I am seeing patients, please, come to me and ask me so we can take care of it. If pt does not get an answer , then we are leaving it up th the weekend doctor on call to to take care of it and pt will complain, that nobody called him back which happens a lot when I am on call. Thanx. DB

## 2012-05-19 NOTE — Telephone Encounter (Signed)
Dr. Leone Payor as doc of the day please see the note below from pt. Pt is still having problems with diarrhea and she has finished the Flagyl.

## 2012-05-20 NOTE — Telephone Encounter (Signed)
Dr. Arlyce Dice please see the notes below and advise.

## 2012-05-20 NOTE — Telephone Encounter (Signed)
Please check with Dr. Arlyce Dice if is this his patient and he knows her I believe hospital docs still answer ?'s about their outpatients  I cced him

## 2012-05-21 NOTE — Telephone Encounter (Signed)
Pt aware and will come for stool studies when she gets back in town Monday. Will call pt on Monday regarding a F/U OV.

## 2012-05-21 NOTE — Telephone Encounter (Signed)
Check stools for C. difficile toxin, culture and sensitivity, fecal lactoferrin and schedule her for an office visit for next week

## 2012-05-21 NOTE — Addendum Note (Signed)
Addended by: Selinda Michaels R on: 05/21/2012 01:19 PM   Modules accepted: Orders

## 2012-05-26 NOTE — Telephone Encounter (Signed)
Pt scheduled for OV with Dr. Arlyce Dice 05/28/12@2 :15pm. Left message for pt to call back.  Spoke with pt and she is aware of appt date and time.

## 2012-05-27 ENCOUNTER — Other Ambulatory Visit: Payer: Medicare Other

## 2012-05-27 ENCOUNTER — Ambulatory Visit
Admission: RE | Admit: 2012-05-27 | Discharge: 2012-05-27 | Disposition: A | Discharge status: Home / Self Care | Payer: BC Managed Care – PPO | Source: Ambulatory Visit | Attending: Hematology and Oncology | Admitting: Hematology and Oncology

## 2012-05-27 DIAGNOSIS — Z853 Personal history of malignant neoplasm of breast: Secondary | ICD-10-CM

## 2012-05-27 DIAGNOSIS — R197 Diarrhea, unspecified: Secondary | ICD-10-CM

## 2012-05-27 DIAGNOSIS — Z9889 Other specified postprocedural states: Secondary | ICD-10-CM

## 2012-05-28 ENCOUNTER — Ambulatory Visit (INDEPENDENT_AMBULATORY_CARE_PROVIDER_SITE_OTHER): Payer: Medicare Other | Admitting: Gastroenterology

## 2012-05-28 ENCOUNTER — Encounter: Payer: Self-pay | Admitting: Gastroenterology

## 2012-05-28 VITALS — BP 152/84 | HR 88 | Ht 66.0 in | Wt 163.2 lb

## 2012-05-28 DIAGNOSIS — R197 Diarrhea, unspecified: Secondary | ICD-10-CM

## 2012-05-28 LAB — CLOSTRIDIUM DIFFICILE BY PCR: Toxigenic C. Difficile by PCR: NOT DETECTED

## 2012-05-28 NOTE — Progress Notes (Signed)
History of Present Illness:  Mrs. Suderman returns for followup of diarrhea. Symptoms are slowly improving. She has one or 2 loose stools daily. Recent stools for C. difficile, culture and parasites were negative. Urgency has decreased.    Review of Systems: Pertinent positive and negative review of systems were noted in the above HPI section. All other review of systems were otherwise negative.    Current Medications, Allergies, Past Medical History, Past Surgical History, Family History and Social History were reviewed in [employer] electronic medical record  Vital signs were reviewed in today's medical record. Physical Exam: General: Well developed , well nourished, no acute distress Head: Normocephalic and atraumatic Eyes:  sclerae anicteric, EOMI Ears: Normal auditory acuity Mouth: No deformity or lesions Lungs: Clear throughout to auscultation Heart: Regular rate and rhythm; no murmurs, rubs or bruits Abdomen: Soft, non tender and non distended. No masses, hepatosplenomegaly or hernias noted. Normal Bowel sounds Rectal:deferred Musculoskeletal: Symmetrical with no gross deformities  Pulses:  Normal pulses noted Extremities: No clubbing, cyanosis, edema or deformities noted Neurological: Alert oriented x 4, grossly nonfocal Psychological:  Alert and cooperative. Normal mood and affect

## 2012-05-28 NOTE — Assessment & Plan Note (Signed)
Probable resolving nonbacterial diarrheal illness. 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  Recommendations #1 daily probiotic for 10-14 days #2 no further GI workup unless symptoms recur or worsen.

## 2012-05-28 NOTE — Patient Instructions (Addendum)
Follow up as needed

## 2012-05-29 ENCOUNTER — Telehealth (INDEPENDENT_AMBULATORY_CARE_PROVIDER_SITE_OTHER): Payer: Self-pay | Admitting: General Surgery

## 2012-05-29 NOTE — Telephone Encounter (Signed)
Spoke with pt and informed her that her MGM was negative.  She was pleased with this.

## 2012-05-29 NOTE — Telephone Encounter (Signed)
Message copied by Littie Deeds on Thu May 29, 2012  9:14 AM ------      Message from: Caleen Essex III      Created: Wed May 28, 2012 10:14 PM       Looks neg

## 2012-06-03 ENCOUNTER — Encounter: Payer: Medicare Other | Admitting: Hematology and Oncology

## 2012-06-03 DIAGNOSIS — C50919 Malignant neoplasm of unspecified site of unspecified female breast: Secondary | ICD-10-CM

## 2012-06-03 DIAGNOSIS — I10 Essential (primary) hypertension: Secondary | ICD-10-CM

## 2012-06-03 DIAGNOSIS — E785 Hyperlipidemia, unspecified: Secondary | ICD-10-CM

## 2012-06-04 ENCOUNTER — Other Ambulatory Visit: Payer: Self-pay | Admitting: Family Medicine

## 2012-06-30 ENCOUNTER — Ambulatory Visit: Payer: Medicare Other | Admitting: Gastroenterology

## 2012-07-16 DIAGNOSIS — H35349 Macular cyst, hole, or pseudohole, unspecified eye: Secondary | ICD-10-CM | POA: Insufficient documentation

## 2012-07-16 DIAGNOSIS — H269 Unspecified cataract: Secondary | ICD-10-CM | POA: Insufficient documentation

## 2012-07-16 DIAGNOSIS — Z961 Presence of intraocular lens: Secondary | ICD-10-CM | POA: Insufficient documentation

## 2012-08-09 ENCOUNTER — Other Ambulatory Visit: Payer: Self-pay | Admitting: Internal Medicine

## 2012-10-22 ENCOUNTER — Encounter: Payer: BC Managed Care – PPO | Admitting: Internal Medicine

## 2012-10-27 ENCOUNTER — Ambulatory Visit (INDEPENDENT_AMBULATORY_CARE_PROVIDER_SITE_OTHER): Payer: PRIVATE HEALTH INSURANCE | Admitting: Internal Medicine

## 2012-10-27 ENCOUNTER — Encounter: Payer: Self-pay | Admitting: Internal Medicine

## 2012-10-27 VITALS — BP 134/92 | HR 72 | Temp 98.7°F | Ht 66.5 in | Wt 167.0 lb

## 2012-10-27 DIAGNOSIS — H9190 Unspecified hearing loss, unspecified ear: Secondary | ICD-10-CM

## 2012-10-27 DIAGNOSIS — E785 Hyperlipidemia, unspecified: Secondary | ICD-10-CM

## 2012-10-27 DIAGNOSIS — I10 Essential (primary) hypertension: Secondary | ICD-10-CM

## 2012-10-27 DIAGNOSIS — C50919 Malignant neoplasm of unspecified site of unspecified female breast: Secondary | ICD-10-CM

## 2012-10-27 LAB — CBC WITH DIFFERENTIAL/PLATELET
Basophils Relative: 0.4 % (ref 0.0–3.0)
Eosinophils Relative: 2.2 % (ref 0.0–5.0)
HCT: 41.7 % (ref 36.0–46.0)
Hemoglobin: 14.2 g/dL (ref 12.0–15.0)
Lymphs Abs: 1.7 10*3/uL (ref 0.7–4.0)
MCV: 91.7 fl (ref 78.0–100.0)
Monocytes Absolute: 0.5 10*3/uL (ref 0.1–1.0)
Neutro Abs: 2.9 10*3/uL (ref 1.4–7.7)
Platelets: 223 10*3/uL (ref 150.0–400.0)
RBC: 4.55 Mil/uL (ref 3.87–5.11)
WBC: 5.3 10*3/uL (ref 4.5–10.5)

## 2012-10-27 LAB — BASIC METABOLIC PANEL
CO2: 30 mEq/L (ref 19–32)
Calcium: 9.6 mg/dL (ref 8.4–10.5)
Glucose, Bld: 98 mg/dL (ref 70–99)
Sodium: 141 mEq/L (ref 135–145)

## 2012-10-27 LAB — POCT URINALYSIS DIPSTICK
Glucose, UA: NEGATIVE
Ketones, UA: NEGATIVE
Leukocytes, UA: NEGATIVE
Spec Grav, UA: 1.025
Urobilinogen, UA: 0.2

## 2012-10-27 LAB — HEPATIC FUNCTION PANEL
ALT: 29 U/L (ref 0–35)
Albumin: 4.3 g/dL (ref 3.5–5.2)
Alkaline Phosphatase: 69 U/L (ref 39–117)
Total Protein: 6.8 g/dL (ref 6.0–8.3)

## 2012-10-27 LAB — LIPID PANEL: HDL: 34 mg/dL — ABNORMAL LOW (ref >39.00)

## 2012-10-27 MED ORDER — LISINOPRIL 20 MG PO TABS
20.0000 mg | ORAL_TABLET | Freq: Every day | ORAL | Status: DC
Start: 2012-10-27 — End: 2013-03-02

## 2012-10-27 NOTE — Progress Notes (Signed)
Subjective:    Angela [name] is a 67 y.o. female who presents for Medicare Annual/Subsequent preventive examination.  Preventive Screening-Counseling & Management  Tobacco History  Smoking status  . Never Smoker   Smokeless tobacco  . Never Used       Current Problems (verified) Patient Active Problem List  Diagnosis  . DEFICIENCY, VITAMIN D NOS  . HYPERLIPIDEMIA  . HYPERTENSION  . OSTEOPENIA  . ABNORMAL GLANDULAR PAPANICOLAOU SMEAR OF CERVIX  . Breast cancer     Current Medications (verified) Current Outpatient Prescriptions  Medication Sig Dispense Refill  . anastrozole (ARIMIDEX) 1 MG tablet Take 1 mg by mouth daily.       . Ascorbic Acid (VITAMIN C) 500 MG tablet Take 500 mg by mouth daily.        Marland Kitchen aspirin 81 MG tablet Take 81 mg by mouth daily.        . Calcium Carbonate-Vitamin D (CALTRATE 600+D) 600-400 MG-UNIT per tablet Take 2 tablets by mouth daily.       . cholecalciferol (VITAMIN D) 400 UNITS TABS Take 400 Units by mouth daily.      Marland Kitchen ketorolac (ACULAR) 0.5 % ophthalmic solution Place 1 drop into the right eye 4 (four) times daily. 0.5%      . lisinopril (PRINIVIL,ZESTRIL) 20 MG tablet take 1 tablet by mouth once daily  30 tablet  3  . Melatonin 3 MG TABS Take by mouth at bedtime.      . Multiple Vitamin (MULTIVITAMIN) tablet Take 1 tablet by mouth daily.        . nitroGLYCERIN (NITROSTAT) 0.4 MG SL tablet Place 1 tablet (0.4 mg total) under the tongue every 5 (five) minutes as needed for chest pain.  30 tablet  0  . omega-3 acid ethyl esters (LOVAZA) 1 G capsule Take 1 g by mouth daily.      . simvastatin (ZOCOR) 40 MG tablet take 1 tablet by mouth at bedtime  30 tablet  3   No current facility-administered medications for this visit.     Allergies (verified) Review of patient's allergies indicates no known allergies.   PAST HISTORY  Family History Family History  Problem Relation Age of Onset  . Heart attack Mother   . Hypertension Mother   .  Heart disease Father   . Heart attack Father   . Hypertension Father   . Colon cancer Neg Hx     Social History History  Substance Use Topics  . Smoking status: Never Smoker   . Smokeless tobacco: Never Used  . Alcohol Use: Yes     Comment: 3 glasses of wine a week     Are there smokers in your home (other than you)? No  Risk Factors   Cardiac risk factors: advanced age (older than 58 for men, 14 for women).  Depression Screen (Note: if answer to either of the following is "Yes", a more complete depression screening is indicated)   Over the past two weeks, have you felt down, depressed or hopeless? No   Activities of Daily Living In your present state of health, do you have any difficulty performing the following activities?:  Driving? No Managing money?  No  Preparing food and eating?: No  Hearing Difficulties: at times she thinks it is difficult (referred to audiology)  Do you feel that you have a problem with memory? No   Cognitive Testing  Alert? Yes  Normal Appearance?Yes  i  List the Names of Other Physician/Practitioners  you currently use: 1.    Indicate any recent Medical Services you may have received from other than [facility] providers in the past year (date may be approximate).  Immunization History  Administered Date(s) Administered  . Influenza Split 06/09/2012  . Influenza Whole 05/22/2011  . Pneumococcal Polysaccharide 08/24/2008  . Td 04/19/2010  . Zoster 11/17/2008    Screening Tests Health Maintenance  Topic Date Due  . Pneumococcal Polysaccharide Vaccine Age 12 And Over  12/24/2011  . Influenza Vaccine  05/11/2013  . Mammogram  05/27/2014  . Colonoscopy  01/04/2018  . Tetanus/tdap  04/19/2020  . Zostavax  Completed    All answers were reviewed with the patient and necessary referrals were made:  Judie Petit, MD   10/27/2012   History reviewed: allergies, current medications, past family history, past medical history, past social  history, past surgical history and problem list  Review of Systems  patient denies chest pain, shortness of breath, orthopnea. Denies lower extremity edema, abdominal pain, change in appetite, change in bowel movements. Patient denies rashes, musculoskeletal complaints. No other specific complaints in a complete review of systems.   Objective:      Body mass index is 26.55 kg/(m^2). BP 134/92  Pulse 72  Temp(Src) 98.7 F (37.1 C) (Oral)  Ht 5' 6.5" (1.689 m)  Wt 167 lb (75.751 kg)  BMI 26.55 kg/m2   Well-developed well-nourished female in no acute distress. HEENT exam atraumatic, normocephalic, extraocular muscles are intact. Neck is supple. No jugular venous distention no thyromegaly. Chest clear to auscultation without increased work of breathing. Cardiac exam S1 and S2 are regular. Abdominal exam active bowel sounds, soft, nontender. Extremities no edema. Neurologic exam she is alert without any motor sensory deficits. Gait is normal.      Assessment:     Well visit      Plan:     During the course of the visit the patient was educated and counseled about appropriate screening and preventive services including:    Pneumococcal vaccine   Influenza vaccine  Td vaccine  Screening mammography  Screening Pap smear and pelvic exam   Colorectal cancer screening  Diet review for nutrition referral? Yes ____  Not Indicated __x__   Patient Instructions (the written plan) was given to the patient.  Medicare Attestation I have personally reviewed: The patient's medical and social history Their use of alcohol, tobacco or illicit drugs Their current medications and supplements The patient's functional ability including ADLs,fall risks, home safety risks, cognitive, and hearing and visual impairment Diet and physical activities Evidence for depression or mood disorders  The patient's weight, height, BMI, and visual acuity have been recorded in the chart.  I have made  referrals, counseling, and provided education to the patient based on review of the above  Judie Petit, MD   10/27/2012

## 2012-10-27 NOTE — Addendum Note (Signed)
Addended by: Bonnye Fava on: 10/27/2012 09:03 AM   Modules accepted: Orders

## 2012-10-28 ENCOUNTER — Encounter: Payer: Self-pay | Admitting: Internal Medicine

## 2012-12-03 ENCOUNTER — Encounter: Payer: PRIVATE HEALTH INSURANCE | Admitting: Internal Medicine

## 2012-12-09 ENCOUNTER — Ambulatory Visit (INDEPENDENT_AMBULATORY_CARE_PROVIDER_SITE_OTHER): Payer: Medicare Other | Admitting: General Surgery

## 2012-12-10 ENCOUNTER — Encounter (INDEPENDENT_AMBULATORY_CARE_PROVIDER_SITE_OTHER): Payer: PRIVATE HEALTH INSURANCE | Admitting: Internal Medicine

## 2012-12-10 DIAGNOSIS — C50919 Malignant neoplasm of unspecified site of unspecified female breast: Secondary | ICD-10-CM

## 2012-12-14 ENCOUNTER — Encounter: Payer: Self-pay | Admitting: Internal Medicine

## 2012-12-15 ENCOUNTER — Encounter: Payer: Self-pay | Admitting: Internal Medicine

## 2012-12-16 ENCOUNTER — Other Ambulatory Visit: Payer: Self-pay

## 2012-12-16 ENCOUNTER — Other Ambulatory Visit: Payer: Self-pay | Admitting: Hematology and Oncology

## 2012-12-16 DIAGNOSIS — Z1231 Encounter for screening mammogram for malignant neoplasm of breast: Secondary | ICD-10-CM

## 2012-12-16 DIAGNOSIS — Z9889 Other specified postprocedural states: Secondary | ICD-10-CM

## 2012-12-16 DIAGNOSIS — Z853 Personal history of malignant neoplasm of breast: Secondary | ICD-10-CM

## 2013-03-02 ENCOUNTER — Encounter: Payer: Self-pay | Admitting: Family Medicine

## 2013-03-02 ENCOUNTER — Ambulatory Visit (INDEPENDENT_AMBULATORY_CARE_PROVIDER_SITE_OTHER): Payer: PRIVATE HEALTH INSURANCE | Admitting: Family Medicine

## 2013-03-02 VITALS — BP 150/88 | HR 96 | Temp 98.4°F | Wt 166.0 lb

## 2013-03-02 DIAGNOSIS — I10 Essential (primary) hypertension: Secondary | ICD-10-CM

## 2013-03-02 DIAGNOSIS — R059 Cough, unspecified: Secondary | ICD-10-CM

## 2013-03-02 DIAGNOSIS — R05 Cough: Secondary | ICD-10-CM

## 2013-03-02 MED ORDER — AMLODIPINE BESYLATE 5 MG PO TABS: 5.0000 mg | ORAL_TABLET | Freq: Every day | ORAL | Status: DC

## 2013-03-02 NOTE — Progress Notes (Signed)
  Subjective:    Patient ID: Angela [name], female    DOB: 07-16-1946, 67 y.o.   MRN: [id number]  HPI Here for one month of a dry tickling cough in the back of her throat. No PDN or sinus congestion. No fever. She thinks this is from her ACE inhibitor. She has been on Lisinopril for 4 years.    Review of Systems  Constitutional: Negative.   HENT: Negative.   Respiratory: Positive for cough. Negative for shortness of breath and wheezing.   Cardiovascular: Negative.        Objective:   Physical Exam  Constitutional: She appears well-developed and well-nourished. No distress.  HENT:  Right Ear: External ear normal.  Left Ear: External ear normal.  Nose: Nose normal.  Mouth/Throat: Oropharynx is clear and moist.  Eyes: Conjunctivae are normal.  Neck: No thyromegaly present.  Cardiovascular: Normal rate, regular rhythm, normal heart sounds and intact distal pulses.   Pulmonary/Chest: Effort normal and breath sounds normal. No respiratory distress. She has no wheezes. She has no rales. She exhibits no tenderness.  Lymphadenopathy:    She has no cervical adenopathy.          Assessment & Plan:  The cough probably is from her Lisinopril. We will stop this and substitute Amlodipine. Recheck in 4 weeks

## 2013-03-03 ENCOUNTER — Ambulatory Visit (INDEPENDENT_AMBULATORY_CARE_PROVIDER_SITE_OTHER): Payer: Medicare Other | Admitting: General Surgery

## 2013-03-03 ENCOUNTER — Encounter (INDEPENDENT_AMBULATORY_CARE_PROVIDER_SITE_OTHER): Payer: Self-pay | Admitting: General Surgery

## 2013-03-03 VITALS — BP 146/92 | HR 88 | Temp 98.7°F | Resp 16 | Ht 66.0 in | Wt 165.6 lb

## 2013-03-03 DIAGNOSIS — C50912 Malignant neoplasm of unspecified site of left female breast: Secondary | ICD-10-CM

## 2013-03-03 DIAGNOSIS — C50919 Malignant neoplasm of unspecified site of unspecified female breast: Secondary | ICD-10-CM

## 2013-03-03 NOTE — Progress Notes (Signed)
Subjective:     Patient ID: Angela [name], female   DOB: 08-11-1946, 67 y.o.   MRN: [id number]  HPI The patient is a 67 year old white female who is 4-1/2 years status post left breast lumpectomy and sentinel node biopsy with isolated tumor cells and one of four lymph nodes. She is taken anastrozole and tolerated it well. She denies any breast pain or discharge from her nipple. She is scheduled for her next mammogram in September.  Review of Systems  Constitutional: Negative.   HENT: Negative.   Eyes: Negative.   Respiratory: Negative.   Cardiovascular: Negative.   Gastrointestinal: Negative.   Endocrine: Negative.   Genitourinary: Negative.   Musculoskeletal: Negative.   Skin: Negative.   Allergic/Immunologic: Negative.   Neurological: Negative.   Hematological: Negative.   Psychiatric/Behavioral: Negative.        Objective:   Physical Exam  Constitutional: She is oriented to person, place, and time. She appears well-developed and well-nourished.  HENT:  Head: Normocephalic and atraumatic.  Eyes: Conjunctivae and EOM are normal. Pupils are equal, round, and reactive to light.  Neck: Normal range of motion. Neck supple.  Cardiovascular: Normal rate, regular rhythm and normal heart sounds.   Pulmonary/Chest: Effort normal and breath sounds normal.  There is no palpable mass in either breast. There is no palpable axillary or supraclavicular cervical lymphadenopathy.  Abdominal: Soft. Bowel sounds are normal. She exhibits no mass. There is no tenderness.  Musculoskeletal: Normal range of motion.  Lymphadenopathy:    She has no cervical adenopathy.  Neurological: She is alert and oriented to person, place, and time.  Skin: Skin is warm and dry.  Psychiatric: She has a normal mood and affect. Her behavior is normal.       Assessment:     The patient is 4-1/2 years status post left breast lumpectomy for breast cancer     Plan:     At this point she will continue to take  anastrozole. She will continue to do regular self exams. We will plan to see her back in one year.

## 2013-03-03 NOTE — Patient Instructions (Signed)
Continue arimidex Continue regular self exams 

## 2013-03-16 ENCOUNTER — Ambulatory Visit (INDEPENDENT_AMBULATORY_CARE_PROVIDER_SITE_OTHER): Payer: PRIVATE HEALTH INSURANCE | Admitting: Internal Medicine

## 2013-03-16 DIAGNOSIS — Z23 Encounter for immunization: Secondary | ICD-10-CM

## 2013-04-08 ENCOUNTER — Ambulatory Visit (INDEPENDENT_AMBULATORY_CARE_PROVIDER_SITE_OTHER): Payer: PRIVATE HEALTH INSURANCE | Admitting: Internal Medicine

## 2013-04-08 ENCOUNTER — Encounter: Payer: Self-pay | Admitting: Internal Medicine

## 2013-04-08 ENCOUNTER — Ambulatory Visit: Payer: PRIVATE HEALTH INSURANCE | Admitting: Internal Medicine

## 2013-04-08 VITALS — BP 128/80 | HR 88 | Temp 98.6°F | Wt 167.0 lb

## 2013-04-08 DIAGNOSIS — I10 Essential (primary) hypertension: Secondary | ICD-10-CM

## 2013-04-08 DIAGNOSIS — E785 Hyperlipidemia, unspecified: Secondary | ICD-10-CM

## 2013-04-08 MED ORDER — ATORVASTATIN CALCIUM 10 MG PO TABS: 10.0000 mg | ORAL_TABLET | Freq: Every day | ORAL | Status: DC

## 2013-04-08 NOTE — Progress Notes (Signed)
htn-- tolerating meds without difficulty- no home bps bp at pharmacy= 120s/70s  Lipids-- should probably change simvastatin   Reviewed pmh, meds   patient denies chest pain, shortness of breath, orthopnea. Denies lower extremity edema, abdominal pain, change in appetite, change in bowel movements. Patient denies rashes, musculoskeletal complaints. No other specific complaints in a complete review of systems.    Well-developed well-nourished female in no acute distress. HEENT exam atraumatic, normocephalic, extraocular muscles are intact. Neck is supple. No jugular venous distention no thyromegaly. Chest clear to auscultation without increased work of breathing. Cardiac exam S1 and S2 are regular. Abdominal exam active bowel sounds, soft, nontender. Extremities no edema. Neurologic exam she is alert without any motor sensory deficits. Gait is normal.

## 2013-04-09 NOTE — Assessment & Plan Note (Signed)
Controlled Continue same meds 

## 2013-04-17 ENCOUNTER — Ambulatory Visit (INDEPENDENT_AMBULATORY_CARE_PROVIDER_SITE_OTHER): Payer: PRIVATE HEALTH INSURANCE | Admitting: *Deleted

## 2013-04-17 DIAGNOSIS — Z23 Encounter for immunization: Secondary | ICD-10-CM

## 2013-04-20 ENCOUNTER — Ambulatory Visit: Payer: PRIVATE HEALTH INSURANCE | Admitting: Internal Medicine

## 2013-05-16 ENCOUNTER — Emergency Department (HOSPITAL_COMMUNITY): Payer: PRIVATE HEALTH INSURANCE

## 2013-05-16 ENCOUNTER — Emergency Department (HOSPITAL_COMMUNITY)
Admission: EM | Admit: 2013-05-16 | Discharge: 2013-05-17 | Disposition: A | Discharge status: Home / Self Care | Payer: PRIVATE HEALTH INSURANCE | Attending: Emergency Medicine | Admitting: Emergency Medicine

## 2013-05-16 ENCOUNTER — Encounter (HOSPITAL_COMMUNITY): Payer: Self-pay

## 2013-05-16 DIAGNOSIS — Z79899 Other long term (current) drug therapy: Secondary | ICD-10-CM | POA: Insufficient documentation

## 2013-05-16 DIAGNOSIS — R079 Chest pain, unspecified: Secondary | ICD-10-CM

## 2013-05-16 DIAGNOSIS — M129 Arthropathy, unspecified: Secondary | ICD-10-CM | POA: Insufficient documentation

## 2013-05-16 DIAGNOSIS — Z853 Personal history of malignant neoplasm of breast: Secondary | ICD-10-CM | POA: Insufficient documentation

## 2013-05-16 DIAGNOSIS — Z85828 Personal history of other malignant neoplasm of skin: Secondary | ICD-10-CM | POA: Insufficient documentation

## 2013-05-16 DIAGNOSIS — I10 Essential (primary) hypertension: Secondary | ICD-10-CM | POA: Insufficient documentation

## 2013-05-16 DIAGNOSIS — M899 Disorder of bone, unspecified: Secondary | ICD-10-CM | POA: Insufficient documentation

## 2013-05-16 DIAGNOSIS — E785 Hyperlipidemia, unspecified: Secondary | ICD-10-CM | POA: Insufficient documentation

## 2013-05-16 DIAGNOSIS — Z7982 Long term (current) use of aspirin: Secondary | ICD-10-CM | POA: Insufficient documentation

## 2013-05-16 LAB — CBC WITH DIFFERENTIAL/PLATELET
Basophils Relative: 0 % (ref 0–1)
Hemoglobin: 14 g/dL (ref 12.0–15.0)
Lymphocytes Relative: 27 % (ref 12–46)
Lymphs Abs: 2.1 10*3/uL (ref 0.7–4.0)
Monocytes Relative: 9 % (ref 3–12)
Neutro Abs: 5 10*3/uL (ref 1.7–7.7)
Neutrophils Relative %: 63 % (ref 43–77)
RBC: 4.37 MIL/uL (ref 3.87–5.11)

## 2013-05-16 LAB — COMPREHENSIVE METABOLIC PANEL
Albumin: 3.9 g/dL (ref 3.5–5.2)
Alkaline Phosphatase: 114 U/L (ref 39–117)
BUN: 18 mg/dL (ref 6–23)
Chloride: 103 mEq/L (ref 96–112)
Glucose, Bld: 144 mg/dL — ABNORMAL HIGH (ref 70–99)
Potassium: 3.6 mEq/L (ref 3.5–5.1)
Total Bilirubin: <0.1 mg/dL — ABNORMAL LOW (ref 0.3–1.2)

## 2013-05-16 LAB — TROPONIN I: Troponin I: <0.3 ng/mL (ref <0.30)

## 2013-05-16 MED ORDER — KETOROLAC TROMETHAMINE 60 MG/2ML IM SOLN
60.0000 mg | Freq: Once | INTRAMUSCULAR | Status: AC
  Administered 2013-05-16: 60 mg via INTRAMUSCULAR
  Filled 2013-05-16: qty 2

## 2013-05-16 NOTE — ED Notes (Signed)
Having chest pain, took a nitro about 30 minutes ago and it did not help that much. For the past 2 weeks I have been having soreness in my left chest per pt. States that she took a nitro with relief yesterday.

## 2013-05-16 NOTE — ED Provider Notes (Addendum)
CSN: [id number]     Arrival date & time 05/16/13  2129 History  This chart was scribed for Angela Lyons, MD by Henri Medal, ED Scribe. This patient was seen in room APA18/APA18 and the patient's care was started at 10:10 PM.    Chief Complaint  Patient presents with  . Chest Pain    The history is provided by the patient. No language interpreter was used.   HPI Comments: Angela [name] is a 67 y.o. female who presents to the Emergency Department complaining of mild intermittent left sided chest pain over the past 2 weeks described as soreness. Pt's pain has a severity of 3/10 currently. She states that she is here tonight due to having persistent pain today. She reports that she recently went to [place] to visit her daughter and while there had one episode of the pain that resolved with NTG. She states that she was riding the train back today when the pain started again. She states that she became concerned when the pain did not subside with two doses of NTG, last dose was at 9 PM. She denies having any modifying factors and denies that it is exertionally related. She denies having any known triggers and states that she lives an active lifestyle. She denies nausea, diaphoresis, and SOB as associated symptoms. She reports having one episode of CP last year in April 2013. She had a nuclear stress test performed at [facility] at the time that was normal and she was prescribed NTG. She denies having a reoccurrence of the CP until recently. Pt denies h/o a cardiac stent, cardiac catheterization or MI. Pt developed a cough in May 2014 and was taken off of lisinopril and placed on Norvasc. She was then started on Lipitor due to concerns of a possible reaction with her old HLD medication and Norvasc. She has a h/o left breast CA treated with a  lumpectomy.   Past Medical History  Diagnosis Date  . Hyperlipidemia   . Osteopenia   . BCC (basal cell carcinoma)   . Breast cancer 2009  . Inflammation of bone  current    foot, in a boot for 6 weeks  . Hypertension   . Arthritis     mild  . Arthritis pain    Past Surgical History  Procedure Laterality Date  . Bcc equired excision    . Tonsillectomy    . Breast lumpectomy  10/09    breast cancer, left  . Mass excision  7/09    appendix  . [facility]   Family History  Problem Relation Age of Onset  . Heart attack Mother   . Hypertension Mother   . Heart disease Father   . Heart attack Father   . Hypertension Father   . Colon cancer Neg Hx    History  Substance Use Topics  . Smoking status: Never Smoker   . Smokeless tobacco: Never Used  . Alcohol Use: Yes     Comment: 3 glasses of wine a week   No OB history provided.  Review of Systems  Constitutional: Negative for diaphoresis.  Respiratory: Negative for shortness of breath.   Cardiovascular: Positive for chest pain.  Gastrointestinal: Negative for nausea.  All other systems reviewed and are negative.    Allergies  Lisinopril  Home Medications   Current Outpatient Rx  Name  Route  Sig  Dispense  Refill  . amLODipine (NORVASC) 5 MG tablet  Oral   Take 1 tablet (5 mg total) by mouth daily.   30 tablet   2   . anastrozole (ARIMIDEX) 1 MG tablet      1 mg. Take 1 mg by mouth daily.         Marland Kitchen aspirin 81 MG tablet   Oral   Take 81 mg by mouth daily.           Marland Kitchen atorvastatin (LIPITOR) 10 MG tablet   Oral   Take 1 tablet (10 mg total) by mouth daily.   90 tablet   3   . Calcium Carbonate-Vitamin D (CALTRATE 600+D) 600-400 MG-UNIT per tablet   Oral   Take 2 tablets by mouth daily.          . cholecalciferol (VITAMIN D) 400 UNITS TABS   Oral   Take 400 Units by mouth daily.         . fish oil-omega-3 fatty acids 1000 MG capsule   Oral   Take 1 g by mouth daily.         Marland Kitchen ketorolac (ACULAR) 0.5 % ophthalmic solution      1 drop. Place 1 drop into the right eye 4 (four) times daily. Dispense 90 day supply w/  3 refills         . Melatonin 3 MG TABS      Take by mouth nightly.         . Multiple Vitamin (MULTIVITAMIN) tablet   Oral   Take 1 tablet by mouth daily.           . nitroGLYCERIN (NITROSTAT) 0.4 MG SL tablet      0.4 mg. Place 0.4 mg under the tongue every 5 (five) minutes as needed. May take up to 3 doses.         . Probiotic Product (PROBIOTIC & ACIDOPHILUS EX ST PO)      10 mg. Take 10 mg by mouth as needed.         . vitamin C (ASCORBIC ACID) 500 MG tablet   Oral   Take 500 mg by mouth daily.          Triage Vitals: BP 155/95  Pulse 108  Temp(Src) 98.8 F (37.1 C) (Oral)  Resp 20  Ht 5\' 6"  (1.676 m)  Wt 167 lb (75.751 kg)  BMI 26.97 kg/m2  SpO2 100%  Physical Exam  Nursing note and vitals reviewed. Constitutional: She is oriented to person, place, and time. She appears well-developed and well-nourished. No distress.  HENT:  Head: Normocephalic and atraumatic.  Eyes: Conjunctivae and EOM are normal.  Neck: Normal range of motion. Neck supple. No tracheal deviation present.  Cardiovascular: Normal rate, regular rhythm and normal heart sounds.   No murmur heard. Pulmonary/Chest: Effort normal and breath sounds normal. No respiratory distress. She has no wheezes. She has no rales.  There is a tenderness to palpation in the left anterior chest wall that seems to reproduce her pain.  Abdominal: Soft. Bowel sounds are normal. There is no tenderness.  Musculoskeletal: Normal range of motion. She exhibits no edema.  Neurological: She is alert and oriented to person, place, and time. No cranial nerve deficit.  Skin: Skin is warm and dry.  Psychiatric: She has a normal mood and affect. Her behavior is normal.    ED Course  Procedures (including critical care time)  Medications  ketorolac (TORADOL) injection 60 mg (60 mg Intramuscular Given 05/16/13 2232)    DIAGNOSTIC STUDIES:  Oxygen Saturation is 100% on room air, normal by my interpretation.     COORDINATION OF CARE: 10:17 PM-Discussed treatment plan which includes pain medication, CXR, CBC panel, CMP and troponin with pt at bedside and pt agreed to plan.   Labs Review Labs Reviewed - No data to display Imaging Review No results found.   Date: 05/16/2013  Rate: 82  Rhythm: normal sinus rhythm  QRS Axis: normal  Intervals: normal  ST/T Wave abnormalities: normal  Conduction Disutrbances:none  Narrative Interpretation:   Old EKG Reviewed: unchanged   MDM  No diagnosis found.  Patient presents here with complaints of discomfort in her chest for the past 2 weeks. Her symptoms seem atypical for cardiac pain and troponin and EKG are unchanged. She underwent a nuclear stress test 18 months ago which was unremarkable. She is feeling better with Toradol and I doubt this is cardiac in nature. She will be discharged to home with instructions to followup with her primary care doctor if not improving in the next few days and return to the emergency department if her symptoms worsen or change.  I personally performed the services described in this documentation, which was scribed in my presence. The recorded information has been reviewed and is accurate.      Angela Lyons, MD 05/16/13 1610  Angela Lyons, MD 05/16/13 [phone]

## 2013-05-25 ENCOUNTER — Ambulatory Visit: Payer: PRIVATE HEALTH INSURANCE | Admitting: Family

## 2013-05-27 ENCOUNTER — Encounter: Payer: Self-pay | Admitting: Family

## 2013-05-27 ENCOUNTER — Other Ambulatory Visit: Payer: Self-pay | Admitting: Family Medicine

## 2013-05-27 ENCOUNTER — Ambulatory Visit (INDEPENDENT_AMBULATORY_CARE_PROVIDER_SITE_OTHER): Payer: Medicare Other | Admitting: Family

## 2013-05-27 VITALS — BP 132/82 | HR 110 | Wt 168.0 lb

## 2013-05-27 DIAGNOSIS — E7800 Pure hypercholesterolemia, unspecified: Secondary | ICD-10-CM

## 2013-05-27 DIAGNOSIS — I10 Essential (primary) hypertension: Secondary | ICD-10-CM

## 2013-05-27 DIAGNOSIS — Z23 Encounter for immunization: Secondary | ICD-10-CM

## 2013-05-27 DIAGNOSIS — E559 Vitamin D deficiency, unspecified: Secondary | ICD-10-CM

## 2013-05-27 NOTE — Patient Instructions (Addendum)
1. Return as scheduled for lab only appt in October. Hold cholesterol medicine until then. Consider Livalo or Pravastatin if we need to.  Sodium-Controlled Diet Sodium is a mineral. It is found in many foods. Sodium may be found naturally or added during the making of a food. The most common form of sodium is salt, which is made up of sodium and chloride. Reducing your sodium intake involves changing your eating habits. The following guidelines will help you reduce the sodium in your diet:  Stop using the salt shaker.  Use salt sparingly in cooking and baking.  Substitute with sodium-free seasonings and spices.  Do not use a salt substitute (potassium chloride) without your caregiver's permission.  Include a variety of fresh, unprocessed foods in your diet.  Limit the use of processed and convenience foods that are high in sodium. USE THE FOLLOWING FOODS SPARINGLY: Breads/Starches  Commercial bread stuffing, commercial pancake or waffle mixes, coating mixes. Waffles. Croutons. Prepared (boxed or frozen) potato, rice, or noodle mixes that contain salt or sodium. Salted [place] fries or hash browns. Salted popcorn, breads, crackers, chips, or snack foods. Vegetables  Vegetables canned with salt or prepared in cream, butter, or cheese sauces. Sauerkraut. Tomato or vegetable juices canned with salt.  Fresh vegetables are allowed if rinsed thoroughly. Fruit  Fruit is okay to eat. Meat and Meat Substitutes  Salted or smoked meats, such as bacon or Canadian bacon, chipped or corned beef, hot dogs, salt pork, luncheon meats, pastrami, ham, or sausage. Canned or smoked fish, poultry, or meat. Processed cheese or cheese spreads, blue or [place]quef[place] cheese. Battered or frozen fish products. Prepared spaghetti sauce. Baked beans. Reuben sandwiches. Salted nuts. Caviar. Milk  Limit buttermilk to 1 cup per week. Soups and Combination Foods  Bouillon cubes, canned or dried soups, broth, consomm.  Convenience (frozen or packaged) dinners with more than 600 mg sodium. Pot pies, pizza, Asian food, fast food cheeseburgers, and specialty sandwiches. Desserts and Sweets  Regular (salted) desserts, pie, commercial fruit snack pies, commercial snack cakes, canned puddings.  Eat desserts and sweets in moderation. Fats and Oils  Gravy mixes or canned gravy. No more than 1 to 2 tbs of salad dressing. Chip dips.  Eat fats and oils in moderation. Beverages  See those listed under the vegetables and milk groups. Condiments  Ketchup, mustard, meat sauces, salsa, regular (salted) and lite soy sauce or mustard. Dill pickles, olives, meat tenderizer. Prepared horseradish or pickle relish. Dutch-processed cocoa. Baking powder or baking soda used medicinally. Worcestershire sauce. "Light" salt. Salt substitute, unless approved by your caregiver. Document Released: 02/16/2002 Document Revised: 11/19/2011 Document Reviewed: 09/19/2009 [facility] Patient Information 2014 [place], [place].

## 2013-05-27 NOTE — Progress Notes (Signed)
Subjective:    Patient ID: Angela [name], female    DOB: [birth year], 67 y.o.   MRN: [id number]  HPI 67 year old white female, nonsmoker, patient of Dr. Cato Mulligan is in today with complaints of complications with Lipitor. Patient reports reporting to the emergency department on 05/16/2013 with complaints of chest pain that was thought to be due to Lipitor. She had a cardiac workup that was negative. Patient reports that when she stopped taking the medication the pain in her chest resolved. She has a history of breast cancer, hypertension, and vitamin D deficiency. Is currently being seen by oncology for management of breast cancer.   Review of Systems  Constitutional: Negative.   HENT: Negative.   Respiratory: Negative.   Cardiovascular: Negative.  Negative for chest pain.  Gastrointestinal: Negative.   Endocrine: Negative.   Musculoskeletal: Negative.  Negative for arthralgias.  Skin: Negative.   Neurological: Negative.   Hematological: Negative.   Psychiatric/Behavioral: Negative.    Past Medical History  Diagnosis Date  . Hyperlipidemia   . Osteopenia   . BCC (basal cell carcinoma)   . Breast cancer 2009  . Inflammation of bone current    foot, in a boot for 6 weeks  . Hypertension   . Arthritis     mild  . Arthritis pain     History   Social History  . Marital Status: Married    Spouse Name: N/A    Number of Children: 3  . Years of Education: N/A   Occupational History  . retired     works 1 day a week   Social History Main Topics  . Smoking status: Never Smoker   . Smokeless tobacco: Never Used  . Alcohol Use: Yes     Comment: 3 glasses of wine a week  . Drug Use: No  . Sexual Activity: Not on file   Other Topics Concern  . Not on file   Social History Narrative  . No narrative on file    Past Surgical History  Procedure Laterality Date  . Bcc equired excision    . Tonsillectomy    . Breast lumpectomy  10/09    breast cancer, left  . Mass excision   7/09    appendix  . [facility]aract [facility]  [facility]    Family History  Problem Relation Age of Onset  . Heart attack Mother   . Hypertension Mother   . Heart disease Father   . Heart attack Father   . Hypertension Father   . Colon cancer Neg Hx     Allergies  Allergen Reactions  . Lisinopril Cough    Current Outpatient Prescriptions on File Prior to Visit  Medication Sig Dispense Refill  . amLODipine (NORVASC) 5 MG tablet Take 1 tablet (5 mg total) by mouth daily.  30 tablet  2  . anastrozole (ARIMIDEX) 1 MG tablet Take 1 mg by mouth every evening. Take 1 mg by mouth daily.      Marland Kitchen aspirin 81 MG tablet Take 81 mg by mouth every evening.       Marland Kitchen atorvastatin (LIPITOR) 10 MG tablet Take 1 tablet (10 mg total) by mouth daily.  90 tablet  3  . Calcium Carbonate-Vitamin D (CALTRATE 600+D) 600-400 MG-UNIT per tablet Take 2 tablets by mouth every evening.       . cholecalciferol (VITAMIN D) 400 UNITS TABS Take 400 Units by mouth daily.      . fish oil-omega-3  fatty acids 1000 MG capsule Take 1 g by mouth every evening.       Marland Kitchen ketorolac (ACULAR) 0.5 % ophthalmic solution 1 drop. Place 1 drop into the right eye 4 (four) times daily. Dispense 90 day supply w/ 3 refills      . Melatonin 3 MG TABS Take by mouth nightly.      . Multiple Vitamin (MULTIVITAMIN) tablet Take 1 tablet by mouth daily.        . nitroGLYCERIN (NITROSTAT) 0.4 MG SL tablet 0.4 mg. Place 0.4 mg under the tongue every 5 (five) minutes as needed. May take up to 3 doses.      . Probiotic Product (PROBIOTIC & ACIDOPHILUS EX ST PO) 10 mg. Take 10 mg by mouth as needed.      . vitamin C (ASCORBIC ACID) 500 MG tablet Take 500 mg by mouth every evening.        No current facility-administered medications on file prior to visit.    BP 132/82  Pulse 110  Wt 168 lb (76.204 kg)  BMI 27.13 kg/m2chart    Objective:   Physical Exam  Constitutional: She is oriented to person, place, and time. She appears  well-developed and well-nourished.  HENT:  Right Ear: External ear normal.  Left Ear: External ear normal.  Nose: Nose normal.  Mouth/Throat: Oropharynx is clear and moist.  Neck: Normal range of motion. Neck supple.  Cardiovascular: Normal rate, regular rhythm and normal heart sounds.   Pulmonary/Chest: Effort normal and breath sounds normal.  Musculoskeletal: Normal range of motion.  Neurological: She is alert and oriented to person, place, and time.  Skin: Skin is warm and dry.  Psychiatric: She has a normal mood and affect.          Assessment & Plan:  Assessment: 1. Hyperlipidemia 2. Breast cancer 3. Hypertension  Plan: Hold Lipitor. I am not so sure that the reaction is not due to her Arimidex. However, I have advised that she stay off cholesterol medication and have her cholesterol rechecked in October as scheduled. We can determine at that time and the risk versus benefit is. Possibly considering Livalo or Pravastatin.

## 2013-05-28 ENCOUNTER — Ambulatory Visit
Admission: RE | Admit: 2013-05-28 | Discharge: 2013-05-28 | Disposition: A | Discharge status: Home / Self Care | Payer: Medicare Other | Source: Ambulatory Visit

## 2013-05-28 ENCOUNTER — Encounter: Payer: Self-pay | Admitting: Internal Medicine

## 2013-05-28 DIAGNOSIS — Z1231 Encounter for screening mammogram for malignant neoplasm of breast: Secondary | ICD-10-CM

## 2013-05-28 DIAGNOSIS — Z9889 Other specified postprocedural states: Secondary | ICD-10-CM

## 2013-05-28 DIAGNOSIS — Z853 Personal history of malignant neoplasm of breast: Secondary | ICD-10-CM

## 2013-06-15 ENCOUNTER — Other Ambulatory Visit (HOSPITAL_COMMUNITY): Payer: Self-pay | Admitting: Hematology and Oncology

## 2013-06-15 ENCOUNTER — Encounter (INDEPENDENT_AMBULATORY_CARE_PROVIDER_SITE_OTHER): Payer: Medicare Other

## 2013-06-15 DIAGNOSIS — C50919 Malignant neoplasm of unspecified site of unspecified female breast: Secondary | ICD-10-CM

## 2013-06-15 DIAGNOSIS — R911 Solitary pulmonary nodule: Secondary | ICD-10-CM

## 2013-06-15 DIAGNOSIS — M899 Disorder of bone, unspecified: Secondary | ICD-10-CM

## 2013-06-15 DIAGNOSIS — C50912 Malignant neoplasm of unspecified site of left female breast: Secondary | ICD-10-CM

## 2013-06-15 DIAGNOSIS — M949 Disorder of cartilage, unspecified: Secondary | ICD-10-CM

## 2013-06-29 ENCOUNTER — Ambulatory Visit (HOSPITAL_COMMUNITY)
Admission: RE | Admit: 2013-06-29 | Discharge: 2013-06-29 | Disposition: A | Discharge status: Home / Self Care | Payer: Medicare Other | Source: Ambulatory Visit | Attending: Hematology and Oncology | Admitting: Hematology and Oncology

## 2013-06-29 DIAGNOSIS — Z853 Personal history of malignant neoplasm of breast: Secondary | ICD-10-CM | POA: Insufficient documentation

## 2013-06-29 DIAGNOSIS — M899 Disorder of bone, unspecified: Secondary | ICD-10-CM | POA: Insufficient documentation

## 2013-06-29 DIAGNOSIS — R911 Solitary pulmonary nodule: Secondary | ICD-10-CM

## 2013-06-29 DIAGNOSIS — C50912 Malignant neoplasm of unspecified site of left female breast: Secondary | ICD-10-CM

## 2013-06-29 DIAGNOSIS — Z780 Asymptomatic menopausal state: Secondary | ICD-10-CM | POA: Insufficient documentation

## 2013-06-30 ENCOUNTER — Encounter: Payer: Self-pay | Admitting: Internal Medicine

## 2013-07-09 ENCOUNTER — Other Ambulatory Visit (INDEPENDENT_AMBULATORY_CARE_PROVIDER_SITE_OTHER): Payer: Medicare Other

## 2013-07-09 DIAGNOSIS — E785 Hyperlipidemia, unspecified: Secondary | ICD-10-CM

## 2013-07-09 LAB — HEPATIC FUNCTION PANEL
ALT: 25 U/L (ref 0–35)
AST: 29 U/L (ref 0–37)
Albumin: 4.2 g/dL (ref 3.5–5.2)
Alkaline Phosphatase: 84 U/L (ref 39–117)
Bilirubin, Direct: 0 mg/dL (ref 0.0–0.3)
Total Bilirubin: 0.5 mg/dL (ref 0.3–1.2)
Total Protein: 6.9 g/dL (ref 6.0–8.3)

## 2013-07-09 LAB — MICROALBUMIN / CREATININE URINE RATIO
Creatinine,U: 11 mg/dL
Microalb Creat Ratio: 4.6 mg/g (ref 0.0–30.0)

## 2013-07-09 LAB — LIPID PANEL
Cholesterol: 234 mg/dL — ABNORMAL HIGH (ref 0–200)
HDL: 36.1 mg/dL — ABNORMAL LOW (ref >39.00)
Total CHOL/HDL Ratio: 6
Triglycerides: 165 mg/dL — ABNORMAL HIGH (ref 0.0–149.0)
VLDL: 33 mg/dL (ref 0.0–40.0)

## 2013-07-11 ENCOUNTER — Encounter: Payer: Self-pay | Admitting: Internal Medicine

## 2013-07-13 ENCOUNTER — Encounter: Payer: Self-pay | Admitting: Internal Medicine

## 2013-07-14 NOTE — Progress Notes (Signed)
Have her stay off statin Document lipitor as allergy/intolerance-- myalgias  Refer to nutritionist for cholesterol control

## 2013-07-17 ENCOUNTER — Ambulatory Visit (INDEPENDENT_AMBULATORY_CARE_PROVIDER_SITE_OTHER): Payer: Medicare Other | Admitting: Internal Medicine

## 2013-07-17 ENCOUNTER — Encounter: Payer: Self-pay | Admitting: Internal Medicine

## 2013-07-17 VITALS — BP 124/80 | HR 76 | Temp 98.5°F | Wt 164.0 lb

## 2013-07-17 DIAGNOSIS — Z23 Encounter for immunization: Secondary | ICD-10-CM

## 2013-07-17 DIAGNOSIS — E785 Hyperlipidemia, unspecified: Secondary | ICD-10-CM

## 2013-07-17 MED ORDER — LOSARTAN POTASSIUM 50 MG PO TABS: 50.0000 mg | ORAL_TABLET | Freq: Every day | ORAL | Status: DC

## 2013-07-17 MED ORDER — SIMVASTATIN 20 MG PO TABS: 20.0000 mg | ORAL_TABLET | Freq: Every day | ORAL | Status: DC

## 2013-07-17 NOTE — Progress Notes (Signed)
Pre-visit discussion using our clinic review tool. No additional management support is needed unless otherwise documented below in the visit note.  

## 2013-07-17 NOTE — Progress Notes (Signed)
Pt wants to talk about meds Previously on simvastatin-  Started norvasc  Changed to lipitor-- she stopped lipitor after having anepisode of chest pain.  A/p Change to simvastatin Change to losartan Total time 15 minutes all discussion/counselling

## 2013-09-18 ENCOUNTER — Ambulatory Visit: Payer: PRIVATE HEALTH INSURANCE | Admitting: Internal Medicine

## 2013-09-28 ENCOUNTER — Ambulatory Visit (INDEPENDENT_AMBULATORY_CARE_PROVIDER_SITE_OTHER): Payer: Medicare Other | Admitting: Family Medicine

## 2013-09-28 ENCOUNTER — Encounter: Payer: Self-pay | Admitting: Family Medicine

## 2013-09-28 VITALS — BP 130/90 | Temp 99.0°F | Wt 164.0 lb

## 2013-09-28 DIAGNOSIS — M62838 Other muscle spasm: Secondary | ICD-10-CM

## 2013-09-28 DIAGNOSIS — H5789 Other specified disorders of eye and adnexa: Secondary | ICD-10-CM

## 2013-09-28 DIAGNOSIS — H579 Unspecified disorder of eye and adnexa: Secondary | ICD-10-CM

## 2013-09-28 MED ORDER — CYCLOBENZAPRINE HCL 5 MG PO TABS: 5.0000 mg | ORAL_TABLET | Freq: Three times a day (TID) | ORAL | Status: DC | PRN

## 2013-09-28 NOTE — Progress Notes (Signed)
Chief Complaint  Patient presents with  . Conjunctivitis  . neck soreness    HPI:  Acute visit:  Conjunctivitis: -started yesterday -both eyes a little itchy, red and with clear drainage -a little cold as well with her husband  -sees eye specialist at [facility] for macular erosion or cysts and is on antiinflam eye medication - seeing eye doctor the day after tomorrow  Crick in neck: -woke up wth it 4 days ago -pain and tension in R side of neck -denies: fevers, weakness or numbness in arms, trauma, HA  ROS: See pertinent positives and negatives per HPI.  Past Medical History  Diagnosis Date  . Hyperlipidemia   . Osteopenia   . BCC (basal cell carcinoma)   . Breast cancer 2009  . Inflammation of bone current    foot, in a boot for 6 weeks  . Hypertension   . Arthritis     mild  . Arthritis pain     Past Surgical History  Procedure Laterality Date  . Bcc equired excision    . Tonsillectomy    . Breast lumpectomy  10/09    breast cancer, left  . Mass excision  7/09    appendix  . [facility]aract [facility]  [facility]    Family History  Problem Relation Age of Onset  . Heart attack Mother   . Hypertension Mother   . Heart disease Father   . Heart attack Father   . Hypertension Father   . Colon cancer Neg Hx     History   Social History  . Marital Status: Married    Spouse Name: N/A    Number of Children: 3  . Years of Education: N/A   Occupational History  . retired     works 1 day a week   Social History Main Topics  . Smoking status: Never Smoker   . Smokeless tobacco: Never Used  . Alcohol Use: Yes     Comment: 3 glasses of wine a week  . Drug Use: No  . Sexual Activity: None   Other Topics Concern  . None   Social History Narrative  . None    Current outpatient prescriptions:anastrozole (ARIMIDEX) 1 MG tablet, Take 1 mg by mouth every evening. Take 1 mg by mouth daily., Disp: , Rfl: ;  aspirin 81 MG tablet, Take 81 mg by mouth  every evening. , Disp: , Rfl: ;  Calcium Carbonate-Vitamin D (CALTRATE 600+D) 600-400 MG-UNIT per tablet, Take 2 tablets by mouth every evening. , Disp: , Rfl: ;  cholecalciferol (VITAMIN D) 400 UNITS TABS, Take 2,000 Units by mouth daily. , Disp: , Rfl:  fish oil-omega-3 fatty acids 1000 MG capsule, Take 1 g by mouth every evening. , Disp: , Rfl: ;  ketorolac (ACULAR) 0.5 % ophthalmic solution, 1 drop. Place 1 drop into the right eye 4 (four) times daily. Dispense 90 day supply w/ 3 refills, Disp: , Rfl: ;  losartan (COZAAR) 50 MG tablet, Take 1 tablet (50 mg total) by mouth daily., Disp: 90 tablet, Rfl: 3;  Melatonin 3 MG TABS, Take by mouth nightly., Disp: , Rfl:  Multiple Vitamin (MULTIVITAMIN) tablet, Take 1 tablet by mouth daily.  , Disp: , Rfl: ;  nitroGLYCERIN (NITROSTAT) 0.4 MG SL tablet, 0.4 mg. Place 0.4 mg under the tongue every 5 (five) minutes as needed. May take up to 3 doses., Disp: , Rfl: ;  simvastatin (ZOCOR) 20 MG tablet, Take 1 tablet (20  mg total) by mouth at bedtime., Disp: 90 tablet, Rfl: 3;  vitamin C (ASCORBIC ACID) 500 MG tablet, Take 500 mg by mouth every evening. , Disp: , Rfl:  Vitamin D, Ergocalciferol, (DRISDOL) 50000 UNITS CAPS capsule, Take 1 capsule by mouth once a week., Disp: , Rfl: ;  cyclobenzaprine (FLEXERIL) 5 MG tablet, Take 1 tablet (5 mg total) by mouth 3 (three) times daily as needed for muscle spasms., Disp: 15 tablet, Rfl: 0  EXAM:  Filed Vitals:   09/28/13 1259  BP: 130/90  Temp: 99 F (37.2 C)    Body mass index is 26.48 kg/(m^2).  GENERAL: vitals reviewed and listed above, alert, oriented, appears well hydrated and in no acute distress  HEENT: atraumatic, conjunttiva with mild erythema and tears, visual acuity grossly intact with glasses, PERRLA, no obvious abnormalities on inspection of external nose and ears  NECK: no obvious masses on inspection, mild tension and TTP R traps and scalenes, no bony TTP, no meningeal signs, normal flex and  extension, rotation mildly limited by pain, no bony TTP  MS: moves all extremities without noticeable abnormality - see above  PSYCH: pleasant and cooperative, no obvious depression or anxiety  ASSESSMENT AND PLAN:  Discussed the following assessment and plan:  Neck muscle spasm - Plan: cyclobenzaprine (FLEXERIL) 5 MG tablet  Eye irritation  -we discussed possible serious and likely etiologies, workup and treatment, treatment risks and return precautions -after this discussion, Tamaira opted for seeing her eye doctor promptly - cool compresses in meantime; flexeril, tylenol and heat for nexk spasm along with HEP  -follow up advised one month for neck, with optho for eye in 1 day -of course, we advised Heatherly  to return or notify a doctor immediately if symptoms worsen or persist or new concerns arise.   There are no Patient Instructions on file for this visit.   Colin Benton R.

## 2013-09-28 NOTE — Patient Instructions (Signed)
-  cool compresses for eyes and see your eye doctor  -tylenol or ibuprofen as instructed for neck -heat for 15 minutes twice daily for neck -exercises provided -topical sports creams can also help  Follow up with your doctor as needed and certainly if worsening or not improving

## 2013-09-28 NOTE — Progress Notes (Signed)
Pre visit review using our clinic review tool, if applicable. No additional management support is needed unless otherwise documented below in the visit note. 

## 2013-10-06 ENCOUNTER — Ambulatory Visit (INDEPENDENT_AMBULATORY_CARE_PROVIDER_SITE_OTHER): Payer: Medicare Other | Admitting: Internal Medicine

## 2013-10-06 ENCOUNTER — Other Ambulatory Visit: Payer: Medicare Other | Admitting: *Deleted

## 2013-10-06 DIAGNOSIS — Z23 Encounter for immunization: Secondary | ICD-10-CM

## 2013-10-06 NOTE — Addendum Note (Signed)
Addended by: Allyne Gee on: 10/06/2013 11:07 AM   Modules accepted: Orders

## 2013-10-26 ENCOUNTER — Encounter: Payer: Self-pay | Admitting: Internal Medicine

## 2013-10-30 ENCOUNTER — Encounter: Payer: PRIVATE HEALTH INSURANCE | Admitting: Internal Medicine

## 2013-11-03 ENCOUNTER — Encounter: Payer: Self-pay | Admitting: Internal Medicine

## 2013-11-03 ENCOUNTER — Ambulatory Visit (INDEPENDENT_AMBULATORY_CARE_PROVIDER_SITE_OTHER): Payer: Medicare Other | Admitting: Internal Medicine

## 2013-11-03 VITALS — BP 150/90 | HR 104 | Temp 98.3°F | Ht 66.0 in | Wt 166.0 lb

## 2013-11-03 DIAGNOSIS — C50919 Malignant neoplasm of unspecified site of unspecified female breast: Secondary | ICD-10-CM

## 2013-11-03 DIAGNOSIS — E785 Hyperlipidemia, unspecified: Secondary | ICD-10-CM

## 2013-11-03 DIAGNOSIS — I10 Essential (primary) hypertension: Secondary | ICD-10-CM

## 2013-11-03 DIAGNOSIS — Z0000 Encounter for general adult medical examination without abnormal findings: Secondary | ICD-10-CM

## 2013-11-03 LAB — CBC WITH DIFFERENTIAL/PLATELET
Basophils Absolute: 0 10*3/uL (ref 0.0–0.1)
Basophils Relative: 0.4 % (ref 0.0–3.0)
EOS PCT: 1.7 % (ref 0.0–5.0)
Eosinophils Absolute: 0.1 10*3/uL (ref 0.0–0.7)
HEMATOCRIT: 43.2 % (ref 36.0–46.0)
HEMOGLOBIN: 14.2 g/dL (ref 12.0–15.0)
LYMPHS ABS: 1.8 10*3/uL (ref 0.7–4.0)
Lymphocytes Relative: 31.7 % (ref 12.0–46.0)
MCHC: 32.8 g/dL (ref 30.0–36.0)
MCV: 95 fl (ref 78.0–100.0)
MONO ABS: 0.5 10*3/uL (ref 0.1–1.0)
MONOS PCT: 9 % (ref 3.0–12.0)
NEUTROS ABS: 3.2 10*3/uL (ref 1.4–7.7)
Neutrophils Relative %: 57.2 % (ref 43.0–77.0)
PLATELETS: 235 10*3/uL (ref 150.0–400.0)
RBC: 4.55 Mil/uL (ref 3.87–5.11)
RDW: 12.4 % (ref 11.5–14.6)
WBC: 5.7 10*3/uL (ref 4.5–10.5)

## 2013-11-03 LAB — HEPATIC FUNCTION PANEL
ALBUMIN: 4.2 g/dL (ref 3.5–5.2)
ALT: 33 U/L (ref 0–35)
AST: 25 U/L (ref 0–37)
Alkaline Phosphatase: 71 U/L (ref 39–117)
Bilirubin, Direct: 0.1 mg/dL (ref 0.0–0.3)
Total Bilirubin: 0.6 mg/dL (ref 0.3–1.2)
Total Protein: 6.7 g/dL (ref 6.0–8.3)

## 2013-11-03 LAB — TSH: TSH: 1.11 u[IU]/mL (ref 0.35–5.50)

## 2013-11-03 LAB — BASIC METABOLIC PANEL
BUN: 16 mg/dL (ref 6–23)
CHLORIDE: 104 meq/L (ref 96–112)
CO2: 28 meq/L (ref 19–32)
Calcium: 9.5 mg/dL (ref 8.4–10.5)
Creatinine, Ser: 0.8 mg/dL (ref 0.4–1.2)
GFR: 79.5 mL/min (ref >60.00)
GLUCOSE: 80 mg/dL (ref 70–99)
POTASSIUM: 3.5 meq/L (ref 3.5–5.1)
SODIUM: 139 meq/L (ref 135–145)

## 2013-11-03 LAB — LIPID PANEL
CHOLESTEROL: 171 mg/dL (ref 0–200)
HDL: 40.1 mg/dL (ref >39.00)
LDL Cholesterol: 104 mg/dL — ABNORMAL HIGH (ref 0–99)
TRIGLYCERIDES: 133 mg/dL (ref 0.0–149.0)
Total CHOL/HDL Ratio: 4
VLDL: 26.6 mg/dL (ref 0.0–40.0)

## 2013-11-03 MED ORDER — METOPROLOL SUCCINATE ER 50 MG PO TB24: 50.0000 mg | ORAL_TABLET | Freq: Every day | ORAL | Status: DC

## 2013-11-03 NOTE — Progress Notes (Signed)
Here for cpx Has breast ca and hypertension. Went to ED in [place] - heart was racing. Was told to increase losartan to 100 mg.  Breast CA- has urologist in [facility]- recently prescribed for a skin infection on foot.  She has noted heart rate has been higher than normal. (>90/min)  Hyperlipidemia- unable to tolerate statins previously, now on simvastatin  Past Medical History  Diagnosis Date  . Hyperlipidemia   . Osteopenia   . BCC (basal cell carcinoma)   . Breast cancer 2009  . Inflammation of bone current    foot, in a boot for 6 weeks  . Hypertension   . Arthritis     mild  . Arthritis pain     History   Social History  . Marital Status: Married    Spouse Name: N/A    Number of Children: 3  . Years of Education: N/A   Occupational History  . retired     works 1 day a week   Social History Main Topics  . Smoking status: Never Smoker   . Smokeless tobacco: Never Used  . Alcohol Use: Yes     Comment: 3 glasses of wine a week  . Drug Use: No  . Sexual Activity: Not on file   Other Topics Concern  . Not on file   Social History Narrative  . No narrative on file    Past Surgical History  Procedure Laterality Date  . Bcc equired excision    . Tonsillectomy    . Breast lumpectomy  10/09    breast cancer, left  . Mass excision  7/09    appendix  . [facility]    Family History  Problem Relation Age of Onset  . Heart attack Mother   . Hypertension Mother   . Heart disease Father   . Heart attack Father   . Hypertension Father   . Colon cancer Neg Hx     Allergies  Allergen Reactions  . Lisinopril Cough  . Statins Other (See Comments)    Myalgia with lipitor- unproven She is able to tolerate simvastatin    Current Outpatient Prescriptions on File Prior to Visit  Medication Sig Dispense Refill  . anastrozole (ARIMIDEX) 1 MG tablet Take 1 mg by mouth every evening. Take 1 mg by mouth daily.      Marland Kitchen aspirin  81 MG tablet Take 81 mg by mouth every evening.       . Calcium Carbonate-Vitamin D (CALTRATE 600+D) 600-400 MG-UNIT per tablet Take 2 tablets by mouth every evening.       . cholecalciferol (VITAMIN D) 400 UNITS TABS Take 2,000 Units by mouth daily.       . cyclobenzaprine (FLEXERIL) 5 MG tablet Take 1 tablet (5 mg total) by mouth 3 (three) times daily as needed for muscle spasms.  15 tablet  0  . fish oil-omega-3 fatty acids 1000 MG capsule Take 1 g by mouth every evening.       Marland Kitchen ketorolac (ACULAR) 0.5 % ophthalmic solution 1 drop. Place 1 drop into the right eye 4 (four) times daily. Dispense 90 day supply w/ 3 refills      . Melatonin 3 MG TABS Take by mouth nightly.      . Multiple Vitamin (MULTIVITAMIN) tablet Take 1 tablet by mouth daily.        . nitroGLYCERIN (NITROSTAT) 0.4 MG SL tablet 0.4 mg. Place 0.4 mg  under the tongue every 5 (five) minutes as needed. May take up to 3 doses.      . simvastatin (ZOCOR) 20 MG tablet Take 1 tablet (20 mg total) by mouth at bedtime.  90 tablet  3  . vitamin C (ASCORBIC ACID) 500 MG tablet Take 500 mg by mouth every evening.       . Vitamin D, Ergocalciferol, (DRISDOL) 50000 UNITS CAPS capsule Take 1 capsule by mouth once a week.       No current facility-administered medications on file prior to visit.     patient denies chest pain, shortness of breath, orthopnea. Denies lower extremity edema, abdominal pain, change in appetite, change in bowel movements. Patient denies rashes, musculoskeletal complaints. No other specific complaints in a complete review of systems.   BP 150/90  Pulse 104  Temp(Src) 98.3 F (36.8 C) (Oral)  Ht 5\' 6"  (1.676 m)  Wt 166 lb (75.297 kg)  BMI 26.81 kg/m2  Well-developed well-nourished female in no acute distress. HEENT exam atraumatic, normocephalic, extraocular muscles are intact. Neck is supple. No jugular venous distention no thyromegaly. Chest clear to auscultation without increased work of breathing. Cardiac exam  S1 and S2 are regular. Abdominal exam active bowel sounds, soft, nontender. Extremities no edema. Neurologic exam she is alert without any motor sensory deficits. Gait is normal.  Well visit- health maint utd

## 2013-11-03 NOTE — Progress Notes (Signed)
Pre visit review using our clinic review tool, if applicable. No additional management support is needed unless otherwise documented below in the visit note. 

## 2013-11-03 NOTE — Patient Instructions (Signed)
Stop losartan Start metoprolol 1 tab daily Recheck blood pressure her in 2-3 weeks

## 2013-11-04 ENCOUNTER — Telehealth: Payer: Self-pay | Admitting: Internal Medicine

## 2013-11-04 NOTE — Telephone Encounter (Signed)
Relevant patient education assigned to patient using Emmi. ° °

## 2013-11-18 ENCOUNTER — Encounter: Payer: Self-pay | Admitting: Internal Medicine

## 2013-11-20 ENCOUNTER — Ambulatory Visit: Payer: Medicare Other | Admitting: Internal Medicine

## 2013-11-23 ENCOUNTER — Ambulatory Visit (INDEPENDENT_AMBULATORY_CARE_PROVIDER_SITE_OTHER): Payer: Medicare Other | Admitting: Internal Medicine

## 2013-11-23 VITALS — BP 140/90 | HR 72

## 2013-11-23 DIAGNOSIS — I10 Essential (primary) hypertension: Secondary | ICD-10-CM

## 2013-11-23 NOTE — Progress Notes (Signed)
Pt comes in today for a nurse visit BP check.  She states she checks her BP at home and systolic is usually in the 140s.  She can't remember dystolic because she did not bring a list of her BP readings.  Pulse has ranged in the high 60s to low 70s.  Med list was updated, note forwarded to Dr Leanne Chang for review.  Told pt I would call her if Dr Leanne Chang wanted to change anything.

## 2013-12-14 ENCOUNTER — Other Ambulatory Visit (HOSPITAL_COMMUNITY): Payer: Medicare Other

## 2013-12-14 ENCOUNTER — Ambulatory Visit (HOSPITAL_COMMUNITY): Payer: Medicare Other

## 2013-12-25 ENCOUNTER — Encounter (HOSPITAL_COMMUNITY): Payer: Self-pay

## 2013-12-25 ENCOUNTER — Encounter (HOSPITAL_COMMUNITY): Payer: Medicare Other | Attending: Hematology and Oncology

## 2013-12-25 VITALS — BP 148/88 | HR 67 | Temp 98.5°F | Resp 18 | Wt 168.1 lb

## 2013-12-25 DIAGNOSIS — E785 Hyperlipidemia, unspecified: Secondary | ICD-10-CM | POA: Insufficient documentation

## 2013-12-25 DIAGNOSIS — Z9010 Acquired absence of unspecified breast and nipple: Secondary | ICD-10-CM | POA: Insufficient documentation

## 2013-12-25 DIAGNOSIS — Z85828 Personal history of other malignant neoplasm of skin: Secondary | ICD-10-CM | POA: Insufficient documentation

## 2013-12-25 DIAGNOSIS — M949 Disorder of cartilage, unspecified: Secondary | ICD-10-CM

## 2013-12-25 DIAGNOSIS — M899 Disorder of bone, unspecified: Secondary | ICD-10-CM | POA: Insufficient documentation

## 2013-12-25 DIAGNOSIS — I10 Essential (primary) hypertension: Secondary | ICD-10-CM

## 2013-12-25 DIAGNOSIS — Z853 Personal history of malignant neoplasm of breast: Secondary | ICD-10-CM

## 2013-12-25 DIAGNOSIS — Z923 Personal history of irradiation: Secondary | ICD-10-CM | POA: Insufficient documentation

## 2013-12-25 DIAGNOSIS — Z09 Encounter for follow-up examination after completed treatment for conditions other than malignant neoplasm: Secondary | ICD-10-CM | POA: Insufficient documentation

## 2013-12-25 DIAGNOSIS — M1990 Unspecified osteoarthritis, unspecified site: Secondary | ICD-10-CM | POA: Insufficient documentation

## 2013-12-25 DIAGNOSIS — C50919 Malignant neoplasm of unspecified site of unspecified female breast: Secondary | ICD-10-CM

## 2013-12-25 DIAGNOSIS — E559 Vitamin D deficiency, unspecified: Secondary | ICD-10-CM | POA: Insufficient documentation

## 2013-12-25 LAB — COMPREHENSIVE METABOLIC PANEL
ALBUMIN: 4 g/dL (ref 3.5–5.2)
ALT: 31 U/L (ref 0–35)
AST: 28 U/L (ref 0–37)
Alkaline Phosphatase: 87 U/L (ref 39–117)
BILIRUBIN TOTAL: 0.4 mg/dL (ref 0.3–1.2)
BUN: 13 mg/dL (ref 6–23)
CALCIUM: 9.6 mg/dL (ref 8.4–10.5)
CHLORIDE: 101 meq/L (ref 96–112)
CO2: 30 mEq/L (ref 19–32)
CREATININE: 0.62 mg/dL (ref 0.50–1.10)
GFR calc Af Amer: >90 mL/min (ref >90)
GFR calc non Af Amer: >90 mL/min (ref >90)
Glucose, Bld: 98 mg/dL (ref 70–99)
Potassium: 4.1 mEq/L (ref 3.7–5.3)
Sodium: 140 mEq/L (ref 137–147)
Total Protein: 7 g/dL (ref 6.0–8.3)

## 2013-12-25 LAB — CBC WITH DIFFERENTIAL/PLATELET
BASOS PCT: 0 % (ref 0–1)
Basophils Absolute: 0 10*3/uL (ref 0.0–0.1)
EOS ABS: 0.1 10*3/uL (ref 0.0–0.7)
EOS PCT: 3 % (ref 0–5)
HEMATOCRIT: 42.3 % (ref 36.0–46.0)
HEMOGLOBIN: 14.3 g/dL (ref 12.0–15.0)
Lymphocytes Relative: 35 % (ref 12–46)
Lymphs Abs: 1.9 10*3/uL (ref 0.7–4.0)
MCH: 31.4 pg (ref 26.0–34.0)
MCHC: 33.8 g/dL (ref 30.0–36.0)
MCV: 92.8 fL (ref 78.0–100.0)
MONO ABS: 0.5 10*3/uL (ref 0.1–1.0)
MONOS PCT: 9 % (ref 3–12)
NEUTROS ABS: 2.9 10*3/uL (ref 1.7–7.7)
Neutrophils Relative %: 53 % (ref 43–77)
Platelets: 231 10*3/uL (ref 150–400)
RBC: 4.56 MIL/uL (ref 3.87–5.11)
RDW: 12.2 % (ref 11.5–15.5)
WBC: 5.4 10*3/uL (ref 4.0–10.5)

## 2013-12-25 LAB — CEA

## 2013-12-25 LAB — CANCER ANTIGEN 27.29: CA 27.29: 23 U/mL (ref 0–39)

## 2013-12-25 NOTE — Progress Notes (Signed)
Labs drawn from right ac, 23 gauge butterfly.  Pt tolerated well.

## 2013-12-25 NOTE — Patient Instructions (Signed)
[facility] Discharge Instructions  RECOMMENDATIONS MADE BY THE CONSULTANT AND ANY TEST RESULTS WILL BE SENT TO YOUR REFERRING PHYSICIAN.  We will see you in the 27th for your prolia injection. You will see the doctor in 6 months, with repeat lab work and injection at that time.  Thank you for choosing [facility] to provide your oncology and hematology care.  To afford each patient quality time with our providers, please arrive at least 15 minutes before your scheduled appointment time.  With your help, our goal is to use those 15 minutes to complete the necessary work-up to ensure our physicians have the information they need to help with your evaluation and healthcare recommendations.    Effective January 1st, 2014, we ask that you re-schedule your appointment with our physicians should you arrive 10 or more minutes late for your appointment.  We strive to give you quality time with our providers, and arriving late affects you and other patients whose appointments are after yours.    Again, thank you for choosing [facility].  Our hope is that these requests will decrease the amount of time that you wait before being seen by our physicians.       _____________________________________________________________  Should you have questions after your visit to [facility], please contact our office at [phone] between the hours of 8:30 a.m. and 5:00 p.m.  Voicemails left after 4:30 p.m. will not be returned until the following business day.  For prescription refill requests, have your pharmacy contact our office with your prescription refill request.

## 2013-12-25 NOTE — Progress Notes (Signed)
[place] A. Barnet Glasgow, M.D.  NEW PATIENT EVALUATION   Name: Angela [name] Date: 12/25/2013 MRN: [id number] DOB: [birth year]  PCP: Chancy Hurter, MD   REFERRING PHYSICIAN: Swords, Darrick Penna, MD  REASON FOR REFERRAL: Breast cancer followup     HISTORY OF PRESENT ILLNESS:Angela [name] is a 68 y.o. female who is referred by her family physician for followup of left breast cancer diagnosed in October of 2009 and treated with lumpectomy, sentinel node biopsy, radiotherapy, and endocrine treatment consisting of anastrozole 1 mg daily. Definitive surgery was performed on 07/06/2008. Oncotype DX recurrence score was 8. She had been receiving care in [place] and wishes to have continued oncologic followup in the [facility] system. She completed anastrozole in December of 2014. Appetite is good with no nausea, vomiting, diarrhea, constipation, dysuria, hematuria, vaginal dryness, hot flashes, severe joint pain, cough, wheezing, sore throat, lower extremity swelling or redness, skin rash, headache, or seizures. She did have an episode of supraventricular tachycardia dysrhythmia and was switched from an ACE inhibitor to a beta blocker with improvement in symptomatology in an effort to control blood pressure. She denies any peripheral paresthesias, and did have bone mineral density testing done in October of 2014 at which time osteopenia was diagnosed. She is desirous of therapy with Prolia.   PAST MEDICAL HISTORY:  has a past medical history of Hyperlipidemia; Osteopenia; BCC (basal cell carcinoma); Breast cancer (2009); Inflammation of bone (current); Hypertension; Arthritis; and Arthritis pain.  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    PAST SURGICAL HISTORY: Past Surgical History  Procedure Laterality Date  . Bcc equired excision    . Tonsillectomy    . Breast lumpectomy  10/09    breast cancer, left  . Mass excision  7/09   appendix  . [facility]aract [facility]  [facility]     CURRENT MEDICATIONS: has a current medication list which includes the following prescription(s): aspirin, calcium carbonate, calcium carbonate-vitamin d, cholecalciferol, cyclobenzaprine, fish oil-omega-3 fatty acids, ketorolac, melatonin, metoprolol succinate, multivitamin, nitroglycerin, simvastatin, vitamin c, and vitamin d (ergocalciferol).   ALLERGIES: Lisinopril and Statins   SOCIAL HISTORY:  reports that she has never smoked. She has never used smokeless tobacco. She reports that she drinks alcohol. She reports that she does not use illicit drugs.   FAMILY HISTORY: family history includes Heart attack in her father and mother; Heart disease in her father; Hypertension in her father and mother. There is no history of Colon cancer.    REVIEW OF SYSTEMS:  Other than that discussed above is noncontributory.    PHYSICAL EXAM:  vitals were not taken for this visit.   GENERAL:alert, no distress and comfortable SKIN: skin color, texture, turgor are normal, no rashes or significant lesions EYES: normal, Conjunctiva are pink and non-injected, sclera clear OROPHARYNX:no exudate, no erythema and lips, buccal mucosa, and tongue normal  NECK: supple, thyroid normal size, non-tender, without nodularity CHEST: Status post left breast lumpectomy with no masses in either breast. LYMPH:  no palpable lymphadenopathy in the cervical, axillary or inguinal LUNGS: clear to auscultation and percussion with normal breathing effort HEART: regular rate & rhythm and no murmurs ABDOMEN:abdomen soft, non-tender and normal bowel sounds MUSCULOSKELETALl:no cyanosis of digits, no clubbing or edema. No evidence of lymphedema.  NEURO: alert & oriented x 3 with fluent speech, no focal motor/sensory deficits    LABORATORY DATA:  No visits with results within  30 Day(s) from this visit. Latest known visit with results is:  Office Visit on  11/03/2013  Component Date Value Ref Range Status  . WBC 11/03/2013 5.7  4.5 - 10.5 K/uL Final  . RBC 11/03/2013 4.55  3.87 - 5.11 Mil/uL Final  . Hemoglobin 11/03/2013 14.2  12.0 - 15.0 g/dL Final  . HCT 11/03/2013 43.2  36.0 - 46.0 % Final  . MCV 11/03/2013 95.0  78.0 - 100.0 fl Final  . MCHC 11/03/2013 32.8  30.0 - 36.0 g/dL Final  . RDW 11/03/2013 12.4  11.5 - 14.6 % Final  . Platelets 11/03/2013 235.0  150.0 - 400.0 K/uL Final  . Neutrophils Relative % 11/03/2013 57.2  43.0 - 77.0 % Final  . Lymphocytes Relative 11/03/2013 31.7  12.0 - 46.0 % Final  . Monocytes Relative 11/03/2013 9.0  3.0 - 12.0 % Final  . Eosinophils Relative 11/03/2013 1.7  0.0 - 5.0 % Final  . Basophils Relative 11/03/2013 0.4  0.0 - 3.0 % Final  . Neutro Abs 11/03/2013 3.2  1.4 - 7.7 K/uL Final  . Lymphs Abs 11/03/2013 1.8  0.7 - 4.0 K/uL Final  . Monocytes Absolute 11/03/2013 0.5  0.1 - 1.0 K/uL Final  . Eosinophils Absolute 11/03/2013 0.1  0.0 - 0.7 K/uL Final  . Basophils Absolute 11/03/2013 0.0  0.0 - 0.1 K/uL Final  . Total Bilirubin 11/03/2013 0.6  0.3 - 1.2 mg/dL Final  . Bilirubin, Direct 11/03/2013 0.1  0.0 - 0.3 mg/dL Final  . Alkaline Phosphatase 11/03/2013 71  39 - 117 U/L Final  . AST 11/03/2013 25  0 - 37 U/L Final  . ALT 11/03/2013 33  0 - 35 U/L Final  . Total Protein 11/03/2013 6.7  6.0 - 8.3 g/dL Final  . Albumin 11/03/2013 4.2  3.5 - 5.2 g/dL Final  . Cholesterol 11/03/2013 171  0 - 200 mg/dL Final   ATP III Classification       Desirable:  < 200 mg/dL               Borderline High:  200 - 239 mg/dL          High:  > = 240 mg/dL  . Triglycerides 11/03/2013 133.0  0.0 - 149.0 mg/dL Final   Normal:  <150 mg/dLBorderline High:  150 - 199 mg/dL  . HDL 11/03/2013 40.10  >39.00 mg/dL Final  . VLDL 11/03/2013 26.6  0.0 - 40.0 mg/dL Final  . LDL Cholesterol 11/03/2013 104* 0 - 99 mg/dL Final  . Total CHOL/HDL Ratio 11/03/2013 4   Final                  Men          Women1/2 Average Risk     3.4           3.3Average Risk          5.0          4.42X Average Risk          9.6          7.13X Average Risk          15.0          11.0                      . TSH 11/03/2013 1.11  0.35 - 5.50 uIU/mL Final  . Sodium 11/03/2013 139  135 - 145 mEq/L Final  . Potassium 11/03/2013 3.5  3.5 - 5.1 mEq/L Final  . Chloride 11/03/2013 104  96 - 112 mEq/L Final  . CO2 11/03/2013 28  19 - 32 mEq/L Final  . Glucose, Bld 11/03/2013 80  70 - 99 mg/dL Final  . BUN 11/03/2013 16  6 - 23 mg/dL Final  . Creatinine, Ser 11/03/2013 0.8  0.4 - 1.2 mg/dL Final  . Calcium 11/03/2013 9.5  8.4 - 10.5 mg/dL Final  . GFR 11/03/2013 79.50  >60.00 mL/min Final    Urinalysis    Component Value Date/Time   COLORURINE YELLOW 12/14/2011 1845   APPEARANCEUR CLEAR 12/14/2011 1845   LABSPEC 1.009 12/14/2011 1845   PHURINE 6.5 12/14/2011 1845   GLUCOSEU NEGATIVE 12/14/2011 1845   HGBUR NEGATIVE 12/14/2011 1845   HGBUR negative 03/28/2009 0844   BILIRUBINUR n 10/27/2012 1157   BILIRUBINUR NEGATIVE 12/14/2011 1845   KETONESUR NEGATIVE 12/14/2011 1845   PROTEINUR n 10/27/2012 1157   PROTEINUR NEGATIVE 12/14/2011 1845   UROBILINOGEN 0.2 10/27/2012 1157   UROBILINOGEN 0.2 12/14/2011 1845   NITRITE n 10/27/2012 1157   NITRITE NEGATIVE 12/14/2011 1845   LEUKOCYTESUR Negative 10/27/2012 1157      @RADIOGRAPHY : MM Digital Diagnostic Bilat Status: Final result            Study Result    CLINICAL DATA: History of left lumpectomy in 2010.  EXAM:  DIGITAL DIAGNOSTIC bilateral MAMMOGRAM with CAD  DIGITAL BREAST TOMOSYNTHESIS  Digital breast tomosynthesis images are acquired in two projections.  These images are reviewed in combination with the digital mammogram,  confirming the findings below.  COMPARISON: 05/27/2012 and earlier  ACR Breast Density Category b: There is a scattered fibroglandular  pattern.  FINDINGS:  No suspicious mass, distortion, or microcalcifications are  identified to suggest presence of malignancy. Spot tangential  view  of the lumpectomy site is negative.  Mammographic images were processed with CAD.  IMPRESSION:  No mammographic evidence for malignancy.  RECOMMENDATION:  Screening mammogram in one year.(Code:SM-B-01Y)  I have discussed the findings and recommendations with the patient.  Results were also provided in writing at the conclusion of the  visit. If applicable, a reminder letter will be sent to the patient  regarding the next appointment.  BI-RADS CATEGORY 2: Benign Finding(s)  Electronically Signed  By: Shon Hale M.D.  On: 05/28/2013 10:45      PATHOLOGY:           Surgical pathology converted EChart   Status: Final result Visible to patient: This result is not viewable by the patient. Next appt: 01/04/2014 at 10:30 AM in Oncology ([place] 7)            Result Narrative    [facility] [place], [place] [address] [phone]  REPORT OF SURGICAL PATHOLOGY  Case #: [id number] Patient Name: EVELIN, CAKE. Office Chart Number: N/A  MRN: [id number] Pathologist: Lennox Solders. Lyndon Code, MD DOB/Age [birth year] (Age: 86) Gender: F Date Taken: 03/22/2008 Date Received: 03/22/2008  FINAL DIAGNOSIS  MICROSCOPIC EXAMINATION AND DIAGNOSIS  APPENDIX AND PORTION OF CECUM, RESECTION: - MUCINOUS CYSTADENOMA. - SEE COMMENT.  COMMENT The appendix contains a 0.9 cm mass-like lesion near the os which consists predominately of mucin and associated inflammatory cells. The mucin is focally lined by a single layer of intestinal type columnar cells containing apical intracytoplasmic mucin. The mucin infiltrates into the appendiceal wall muscularis propria. However, evidence of gross rupture of the appendix is not identified. (JK:mw, 03/23/08)  mw Date Reported: 03/23/2008  Vonna Kotyk B. Lyndon Code, MD  Electronically Signed Out By JBK   Clinical information (tc)  specimen(s) obtained Appendix, other than incidental, portion of cecum  Gross  Description Received fresh is a 3 cm in length portion of partially opened appendix which ranges from 0.7 cm in diameter at the proximal end, to 0.4 cm in diameter at the distal tip. Also attached is a 3.9 x 3.2 cm portion of cecal cuff. The cecum has tan to hyperemic smooth mucosa. The appendix and cecum have pink to hyperemic smooth serosa and a small amount of attached soft fat. Located within the appendix, adjacent to the os, is a 0.9 x 0.8 cm tan red vaguely nodular mass with a gelatinous cut surface, and is 0.5 cm thick. The underlying wall does not grossly appear to be involved by the mass, and is 0.2 cm thick. No lymph nodes are found within the attached fat. Representative sections are submitted as follows: A, B = appendiceal mass. C = remaining appendix away from mass. D = margins of cecal cuff. Total: 4 blocks. (SSW:cdc 03/22/08)  cc/       Specimen Collected: 07/           Results Surgical pathology converted EChart (Order [id number])            Result Information    Abnormality Status Priority Source    Final result (07/08/2008 1308) Routine          Surgical pathology converted EChart   Status: Final result Visible to patient: This result is not viewable by the patient. Next appt: 01/04/2014 at 10:30 AM in Oncology (AP-ACAPA Chair 7)            Result Narrative     [facility] Case #: [id number] [place], [place] [address] [id number] [phone] Addendum  Patient Name: ILISHA, BLUST. Date Taken: 07/06/2008 MRN: [id number] Date Received: 07/06/2008 DOB/Age: 1946/04/19 (Age: 29) Gender: F Location: [facility] Client: [place] Copy To: Eliseo Squires. Radford Pax, MD Darrick Penna Swords, MD Physician(s): Sammuel Hines. Daiva Nakayama, MD Office Chart #:  Specimen(s) Received: 1: Lymph node, sentinel, biopsy, left axillary 2: Lymph node, sentinel, biopsy, left axillary 3: Lymph node, sentinel, biopsy, left axillary 4: Lymph  node, sentinel, biopsy, left axillary 5: Lymph node, sentinel, biopsy, left axillary 6: Breast, wire/needle localized excisional biopsy/lumpectomy/partial mastectomy, left 7: Breast, excisional biopsy, left, additional (new) inferior margin  Date Ordered: 08/23/2008 Status: Signed Out  Addendum Diagnosis Not Entered  Addendum Comment At the request of the patient' s oncologist, Dr. Sonny Dandy, a representative paraffin block of tumor is sent for the Oncotype DX Breast Cancer Assay. Received from [facility] is a Oncotype DX Breast Cancer Assay report (requisition # [id number]), which indicates the following results verbatim:  Recurrent score = 8  The clinical validation study including female patient' s with stage I or II, node negative, ER-positive breast cancer treated with five years of tamoxifen. Those patient' s who had a recurrent score of 8 had an average rate of distance recurrent of 6% (95% CI: 4%-9%).  Quantitative single gene report using RT-PCR to determine the RNA expression of the genes below is performed and shows the following results. ER score = 11.0, positive PR score = 8.9, positive Her2 score = 8.8, negative (JAS:gt, 08/23/08)  gdt/08/23/2008 Electronically Signed Neldon Mc, MD           IMPRESSION:  #1. Stage I lobular carcinoma of  the left breast, status post lumpectomy, sentinel lobe biopsy, external beam radiotherapy, anastrozole until December of 2014 with original lumpectomy performed on 07/06/2008, no evidence of disease. #2. Osteopenia. #3. Hypertension, on treatment. #4. Vitamin D deficiency #5. Hyperlipidemia.   PLAN:  #1. Prolia 60 mg subcutaneously beginning on 01/04/2014 with followup scheduled in October of 2015 for additional therapy. Continue current medications. #2. Continue self breast examination monthly. #3. Followup bilateral mammography in September 2015. #4. Office visit with basic metabolic profile October 2248.  I  appreciate the opportunity of sharing in her care.  Farrel Gobble, MD 12/25/2013 9:58 AM

## 2013-12-30 ENCOUNTER — Ambulatory Visit (HOSPITAL_COMMUNITY): Payer: Medicare Other

## 2014-01-04 ENCOUNTER — Encounter (HOSPITAL_BASED_OUTPATIENT_CLINIC_OR_DEPARTMENT_OTHER): Payer: Medicare Other

## 2014-01-04 VITALS — BP 150/88 | HR 67 | Temp 98.6°F | Resp 18

## 2014-01-04 DIAGNOSIS — M949 Disorder of cartilage, unspecified: Secondary | ICD-10-CM

## 2014-01-04 DIAGNOSIS — M899 Disorder of bone, unspecified: Secondary | ICD-10-CM

## 2014-01-04 DIAGNOSIS — C50919 Malignant neoplasm of unspecified site of unspecified female breast: Secondary | ICD-10-CM

## 2014-01-04 MED ORDER — DENOSUMAB 60 MG/ML ~~LOC~~ SOLN
60.0000 mg | Freq: Once | SUBCUTANEOUS | Status: AC
  Administered 2014-01-04: 60 mg via SUBCUTANEOUS
  Filled 2014-01-04: qty 1

## 2014-01-04 NOTE — Progress Notes (Signed)
[name] presents today for injection per MD orders. prolia administered SQ in left Abdomen. Administration without incident. Patient tolerated well.

## 2014-01-06 DIAGNOSIS — H35079 Retinal telangiectasis, unspecified eye: Secondary | ICD-10-CM | POA: Insufficient documentation

## 2014-02-25 ENCOUNTER — Encounter (INDEPENDENT_AMBULATORY_CARE_PROVIDER_SITE_OTHER): Payer: Self-pay | Admitting: General Surgery

## 2014-03-08 ENCOUNTER — Ambulatory Visit (INDEPENDENT_AMBULATORY_CARE_PROVIDER_SITE_OTHER): Payer: Medicare Other | Admitting: General Surgery

## 2014-03-08 ENCOUNTER — Encounter (INDEPENDENT_AMBULATORY_CARE_PROVIDER_SITE_OTHER): Payer: Self-pay | Admitting: General Surgery

## 2014-03-08 VITALS — BP 134/77 | HR 75 | Temp 97.1°F | Ht 66.0 in | Wt 166.0 lb

## 2014-03-08 DIAGNOSIS — C50919 Malignant neoplasm of unspecified site of unspecified female breast: Secondary | ICD-10-CM

## 2014-03-08 DIAGNOSIS — C50912 Malignant neoplasm of unspecified site of left female breast: Secondary | ICD-10-CM

## 2014-03-08 NOTE — Progress Notes (Signed)
Subjective:     Patient ID: Angela [name], female   DOB: [birth year], 68 y.o.   MRN: [id number]  HPI The patient is a 68 year old white female who is 5 years status post left breast lumpectomy and sentinel node biopsy for a Left breast cancer with 1 of 4 lymph nodes that had isolated tumor cells. She is now off the mesh to resolve. Her only complaint is of some occasional discomfort along her old appendectomy incision as well as some occasional discomfort in her right arm over the last couple months.  Review of Systems  Constitutional: Negative.   HENT: Negative.   Eyes: Negative.   Respiratory: Negative.   Cardiovascular: Negative.   Gastrointestinal: Negative.   Endocrine: Negative.   Genitourinary: Negative.   Musculoskeletal: Negative.   Skin: Negative.   Allergic/Immunologic: Negative.   Neurological: Negative.   Hematological: Negative.   Psychiatric/Behavioral: Negative.        Objective:   Physical Exam  Constitutional: She is oriented to person, place, and time. She appears well-developed and well-nourished.  HENT:  Head: Normocephalic and atraumatic.  Eyes: Conjunctivae and EOM are normal. Pupils are equal, round, and reactive to light.  Neck: Normal range of motion. Neck supple.  Cardiovascular: Normal rate, regular rhythm and normal heart sounds.   Pulmonary/Chest: Effort normal and breath sounds normal.  There is no palpable mass in either breast. There is no palpable axillary, supraclavicular, or cervical lymphadenopathy  Abdominal: Soft. Bowel sounds are normal.  Musculoskeletal: Normal range of motion.  Lymphadenopathy:    She has no cervical adenopathy.  Neurological: She is alert and oriented to person, place, and time.  Skin: Skin is warm and dry.  Psychiatric: She has a normal mood and affect. Her behavior is normal.       Assessment:     The patient is 5 years status post left breast lumpectomy for breast cancer     Plan:     At this point she will  continue to do regular self exams. We will refer her back to her gastroenterologist for followup after a tumor was removed from her appendix several years ago. I will plan to see her back in one year

## 2014-03-08 NOTE — Patient Instructions (Signed)
Continue regular self exams  

## 2014-03-15 ENCOUNTER — Telehealth: Payer: Self-pay | Admitting: Gastroenterology

## 2014-03-15 NOTE — Telephone Encounter (Signed)
Pt saw Dr. Marlou Starks for checkup and pt states he wanted her to follow-up with GI, states she may need sooner colon done due to history of tumor that was removed from her appendix. Dr. Deatra Ina please advise regarding colon.

## 2014-04-19 ENCOUNTER — Encounter: Payer: Self-pay | Admitting: Internal Medicine

## 2014-04-20 ENCOUNTER — Other Ambulatory Visit: Payer: Self-pay | Admitting: Family Medicine

## 2014-04-20 DIAGNOSIS — Z853 Personal history of malignant neoplasm of breast: Secondary | ICD-10-CM

## 2014-04-20 DIAGNOSIS — Z9889 Other specified postprocedural states: Secondary | ICD-10-CM

## 2014-06-04 ENCOUNTER — Encounter (HOSPITAL_COMMUNITY): Payer: Self-pay | Admitting: Hematology and Oncology

## 2014-06-04 DIAGNOSIS — M810 Age-related osteoporosis without current pathological fracture: Secondary | ICD-10-CM | POA: Insufficient documentation

## 2014-06-04 DIAGNOSIS — M8580 Other specified disorders of bone density and structure, unspecified site: Secondary | ICD-10-CM | POA: Insufficient documentation

## 2014-06-15 ENCOUNTER — Ambulatory Visit
Admission: RE | Admit: 2014-06-15 | Discharge: 2014-06-15 | Disposition: A | Discharge status: Home / Self Care | Payer: Medicare Other | Source: Ambulatory Visit | Attending: Family Medicine | Admitting: Family Medicine

## 2014-06-15 DIAGNOSIS — Z853 Personal history of malignant neoplasm of breast: Secondary | ICD-10-CM

## 2014-06-15 DIAGNOSIS — Z9889 Other specified postprocedural states: Secondary | ICD-10-CM

## 2014-06-23 ENCOUNTER — Other Ambulatory Visit: Payer: Self-pay | Admitting: Internal Medicine

## 2014-07-05 ENCOUNTER — Other Ambulatory Visit (HOSPITAL_COMMUNITY): Payer: Medicare Other

## 2014-07-06 ENCOUNTER — Ambulatory Visit (HOSPITAL_COMMUNITY): Payer: Medicare Other

## 2014-07-06 ENCOUNTER — Encounter (HOSPITAL_COMMUNITY): Payer: Medicare Other | Attending: Hematology and Oncology

## 2014-07-06 DIAGNOSIS — Z853 Personal history of malignant neoplasm of breast: Secondary | ICD-10-CM | POA: Diagnosis not present

## 2014-07-06 DIAGNOSIS — Z08 Encounter for follow-up examination after completed treatment for malignant neoplasm: Secondary | ICD-10-CM | POA: Insufficient documentation

## 2014-07-06 DIAGNOSIS — N644 Mastodynia: Secondary | ICD-10-CM | POA: Insufficient documentation

## 2014-07-06 DIAGNOSIS — E785 Hyperlipidemia, unspecified: Secondary | ICD-10-CM | POA: Insufficient documentation

## 2014-07-06 DIAGNOSIS — M8580 Other specified disorders of bone density and structure, unspecified site: Secondary | ICD-10-CM | POA: Insufficient documentation

## 2014-07-06 DIAGNOSIS — E559 Vitamin D deficiency, unspecified: Secondary | ICD-10-CM | POA: Insufficient documentation

## 2014-07-06 DIAGNOSIS — Z923 Personal history of irradiation: Secondary | ICD-10-CM | POA: Diagnosis not present

## 2014-07-06 DIAGNOSIS — Z7982 Long term (current) use of aspirin: Secondary | ICD-10-CM | POA: Diagnosis not present

## 2014-07-06 DIAGNOSIS — I10 Essential (primary) hypertension: Secondary | ICD-10-CM | POA: Diagnosis not present

## 2014-07-06 DIAGNOSIS — C50919 Malignant neoplasm of unspecified site of unspecified female breast: Secondary | ICD-10-CM

## 2014-07-06 LAB — COMPREHENSIVE METABOLIC PANEL
ALT: 22 U/L (ref 0–35)
ANION GAP: 9 (ref 5–15)
AST: 23 U/L (ref 0–37)
Albumin: 4.2 g/dL (ref 3.5–5.2)
Alkaline Phosphatase: 87 U/L (ref 39–117)
BUN: 16 mg/dL (ref 6–23)
CALCIUM: 10 mg/dL (ref 8.4–10.5)
CO2: 31 meq/L (ref 19–32)
Chloride: 102 mEq/L (ref 96–112)
Creatinine, Ser: 0.72 mg/dL (ref 0.50–1.10)
GFR, EST NON AFRICAN AMERICAN: 87 mL/min — AB (ref >90)
GLUCOSE: 111 mg/dL — AB (ref 70–99)
Potassium: 5 mEq/L (ref 3.7–5.3)
SODIUM: 142 meq/L (ref 137–147)
Total Bilirubin: 0.4 mg/dL (ref 0.3–1.2)
Total Protein: 7.4 g/dL (ref 6.0–8.3)

## 2014-07-06 LAB — CBC WITH DIFFERENTIAL/PLATELET
Basophils Absolute: 0 10*3/uL (ref 0.0–0.1)
Basophils Relative: 0 % (ref 0–1)
EOS ABS: 0.2 10*3/uL (ref 0.0–0.7)
EOS PCT: 2 % (ref 0–5)
HCT: 43.3 % (ref 36.0–46.0)
HEMOGLOBIN: 14.8 g/dL (ref 12.0–15.0)
LYMPHS ABS: 2.2 10*3/uL (ref 0.7–4.0)
LYMPHS PCT: 31 % (ref 12–46)
MCH: 31.7 pg (ref 26.0–34.0)
MCHC: 34.2 g/dL (ref 30.0–36.0)
MCV: 92.7 fL (ref 78.0–100.0)
MONOS PCT: 9 % (ref 3–12)
Monocytes Absolute: 0.6 10*3/uL (ref 0.1–1.0)
Neutro Abs: 4.1 10*3/uL (ref 1.7–7.7)
Neutrophils Relative %: 58 % (ref 43–77)
PLATELETS: 270 10*3/uL (ref 150–400)
RBC: 4.67 MIL/uL (ref 3.87–5.11)
RDW: 11.7 % (ref 11.5–15.5)
WBC: 7 10*3/uL (ref 4.0–10.5)

## 2014-07-06 NOTE — Progress Notes (Signed)
LABS FOR CMP,CBCD 

## 2014-07-07 ENCOUNTER — Ambulatory Visit (HOSPITAL_COMMUNITY): Payer: Medicare Other

## 2014-07-08 ENCOUNTER — Encounter (HOSPITAL_COMMUNITY): Payer: Self-pay

## 2014-07-08 ENCOUNTER — Encounter (HOSPITAL_COMMUNITY): Payer: Medicare Other

## 2014-07-08 ENCOUNTER — Encounter (HOSPITAL_BASED_OUTPATIENT_CLINIC_OR_DEPARTMENT_OTHER): Payer: Medicare Other

## 2014-07-08 VITALS — BP 149/85 | HR 70 | Temp 98.4°F | Resp 18 | Wt 169.8 lb

## 2014-07-08 DIAGNOSIS — M949 Disorder of cartilage, unspecified: Principal | ICD-10-CM

## 2014-07-08 DIAGNOSIS — I10 Essential (primary) hypertension: Secondary | ICD-10-CM

## 2014-07-08 DIAGNOSIS — Z86000 Personal history of in-situ neoplasm of breast: Secondary | ICD-10-CM

## 2014-07-08 DIAGNOSIS — E559 Vitamin D deficiency, unspecified: Secondary | ICD-10-CM

## 2014-07-08 DIAGNOSIS — E785 Hyperlipidemia, unspecified: Secondary | ICD-10-CM

## 2014-07-08 DIAGNOSIS — C50919 Malignant neoplasm of unspecified site of unspecified female breast: Secondary | ICD-10-CM

## 2014-07-08 DIAGNOSIS — M899 Disorder of bone, unspecified: Secondary | ICD-10-CM

## 2014-07-08 MED ORDER — DENOSUMAB 60 MG/ML ~~LOC~~ SOLN
60.0000 mg | Freq: Once | SUBCUTANEOUS | Status: AC
Start: 2014-07-08 — End: 2014-07-08
  Administered 2014-07-08: 60 mg via SUBCUTANEOUS
  Filled 2014-07-08: qty 1

## 2014-07-08 NOTE — Progress Notes (Signed)
Please see doctors office encounter for more information 

## 2014-07-08 NOTE — Progress Notes (Signed)
Angela [name] presents today for injection per MD orders. prolia 60mg  administered SQ in left Abdomen. Administration without incident. Patient tolerated well.

## 2014-07-08 NOTE — Progress Notes (Signed)
[facility]  OFFICE PROGRESS NOTE  Chancy Hurter, MD [place] [address]  DIAGNOSIS: Disorder of bone and cartilage - Plan: denosumab (PROLIA) injection 60 mg, Basic metabolic panel, DG Bone Density  Breast cancer, unspecified laterality - Plan: denosumab (PROLIA) injection 60 mg, CBC with Differential, Comprehensive metabolic panel, CEA, Cancer antigen 27.29, MM Digital Diagnostic Bilat  Chief Complaint  Patient presents with  . Breast Cancer    CURRENT THERAPY: Watchful expectation. PROLIA every 6 months.  INTERVAL HISTORY: Angela [name] 68 y.o. female returns for followup of left breast cancer diagnosed in October of 2009 and treated with lumpectomy, sentinel node biopsy, radiotherapy, and endocrine treatment consisting of anastrozole 1 mg daily. Definitive surgery was performed on 07/06/2008. Oncotype DX recurrence score was 8.  She had been receiving care in [place] and wishes to have continued oncologic followup in the [facility] system. She completed anastrozole in December of 2014. She was seen for the first time in follow-up on 12/25/2013 and is here today for continued care. She does experience occasional pain in the left breast. She denies any fever, night sweats, hot flashes, vaginal bleeding or dryness, dysuria, hematuria, melena, hematochezia, hematuria, lower extremity swelling or redness, chest pain, PND, orthopnea, palpitations, abnormalities on so present examination, lymphedema, skin rash, headache, or seizures.   MEDICAL HISTORY: Past Medical History  Diagnosis Date  . Hyperlipidemia   . Osteopenia   . BCC (basal cell carcinoma)   . Breast cancer 2009  . Inflammation of bone current    foot, in a boot for 6 weeks  . Hypertension   . Arthritis     mild  . Arthritis pain     INTERIM HISTORY: has DEFICIENCY, VITAMIN D NOS; HYPERLIPIDEMIA; HYPERTENSION; Disorder of bone and cartilage; Breast cancer;  and Osteopenia determined by x-ray on her problem list.    No history exists.    ALLERGIES:  is allergic to lisinopril and statins.  MEDICATIONS: has a current medication list which includes the following prescription(s): aspirin, calcium carbonate-vitamin d, cholecalciferol, co-enzyme q-10, denosumab, fish oil-omega-3 fatty acids, ketorolac, melatonin, metoprolol succinate, multivitamin, nitroglycerin, probiotic product, simvastatin, and vitamin c.  SURGICAL HISTORY:  Past Surgical History  Procedure Laterality Date  . Bcc equired excision    . Tonsillectomy    . Breast lumpectomy  10/09    breast cancer, left  . Mass excision  7/09    appendix  . [facility]aract [facility]  [facility]    FAMILY HISTORY: family history includes Heart attack in her father and mother; Heart disease in her father; Hypertension in her father and mother. There is no history of Colon cancer.  SOCIAL HISTORY:  reports that she has never smoked. She has never used smokeless tobacco. She reports that she drinks alcohol. She reports that she does not use illicit drugs.  REVIEW OF SYSTEMS:  Other than that discussed above is noncontributory.  PHYSICAL EXAMINATION: ECOG PERFORMANCE STATUS: 1 - Symptomatic but completely ambulatory  Blood pressure 149/85, pulse 70, temperature 98.4 F (36.9 C), temperature source Oral, resp. rate 18, weight 169 lb 12.8 oz (77.021 kg), SpO2 98.00%.  GENERAL:alert, no distress and comfortable SKIN: skin color, texture, turgor are normal, no rashes or significant lesions EYES: PERLA; Conjunctiva are pink and non-injected, sclera clear SINUSES: No redness or tenderness over maxillary or ethmoid sinuses OROPHARYNX:no exudate, no erythema on lips, buccal mucosa, or tongue. NECK:  supple, thyroid normal size, non-tender, without nodularity. No masses CHEST: Status post left breast lumpectomy with induration at the site of incision. No masses in either breast. LYMPH:  no  palpable lymphadenopathy in the cervical, axillary or inguinal LUNGS: clear to auscultation and percussion with normal breathing effort HEART: regular rate & rhythm and no murmurs. ABDOMEN:abdomen soft, non-tender and normal bowel sounds MUSCULOSKELETAL:no cyanosis of digits and no clubbing. Range of motion normal.  NEURO: alert & oriented x 3 with fluent speech, no focal motor/sensory deficits   LABORATORY DATA: Lab on 07/06/2014  Component Date Value Ref Range Status  . WBC 07/06/2014 7.0  4.0 - 10.5 K/uL Final  . RBC 07/06/2014 4.67  3.87 - 5.11 MIL/uL Final  . Hemoglobin 07/06/2014 14.8  12.0 - 15.0 g/dL Final  . HCT 07/06/2014 43.3  36.0 - 46.0 % Final  . MCV 07/06/2014 92.7  78.0 - 100.0 fL Final  . MCH 07/06/2014 31.7  26.0 - 34.0 pg Final  . MCHC 07/06/2014 34.2  30.0 - 36.0 g/dL Final  . RDW 07/06/2014 11.7  11.5 - 15.5 % Final  . Platelets 07/06/2014 270  150 - 400 K/uL Final  . Neutrophils Relative % 07/06/2014 58  43 - 77 % Final  . Neutro Abs 07/06/2014 4.1  1.7 - 7.7 K/uL Final  . Lymphocytes Relative 07/06/2014 31  12 - 46 % Final  . Lymphs Abs 07/06/2014 2.2  0.7 - 4.0 K/uL Final  . Monocytes Relative 07/06/2014 9  3 - 12 % Final  . Monocytes Absolute 07/06/2014 0.6  0.1 - 1.0 K/uL Final  . Eosinophils Relative 07/06/2014 2  0 - 5 % Final  . Eosinophils Absolute 07/06/2014 0.2  0.0 - 0.7 K/uL Final  . Basophils Relative 07/06/2014 0  0 - 1 % Final  . Basophils Absolute 07/06/2014 0.0  0.0 - 0.1 K/uL Final  . Sodium 07/06/2014 142  137 - 147 mEq/L Final  . Potassium 07/06/2014 5.0  3.7 - 5.3 mEq/L Final  . Chloride 07/06/2014 102  96 - 112 mEq/L Final  . CO2 07/06/2014 31  19 - 32 mEq/L Final  . Glucose, Bld 07/06/2014 111* 70 - 99 mg/dL Final  . BUN 07/06/2014 16  6 - 23 mg/dL Final  . Creatinine, Ser 07/06/2014 0.72  0.50 - 1.10 mg/dL Final  . Calcium 07/06/2014 10.0  8.4 - 10.5 mg/dL Final  . Total Protein 07/06/2014 7.4  6.0 - 8.3 g/dL Final  . Albumin  07/06/2014 4.2  3.5 - 5.2 g/dL Final  . AST 07/06/2014 23  0 - 37 U/L Final  . ALT 07/06/2014 22  0 - 35 U/L Final  . Alkaline Phosphatase 07/06/2014 87  39 - 117 U/L Final  . Total Bilirubin 07/06/2014 0.4  0.3 - 1.2 mg/dL Final  . GFR calc non Af Amer 07/06/2014 87* >90 mL/min Final  . GFR calc Af Amer 07/06/2014 >90  >90 mL/min Final   Comment: (NOTE)                          The eGFR has been calculated using the CKD EPI equation.                          This calculation has not been validated in all clinical situations.  eGFR's persistently <90 mL/min signify possible Chronic Kidney                          Disease.  . Anion gap 07/06/2014 9  5 - 15 Final    PATHOLOGY:  Lobular carcinoma, ER/PR positive, HER-2/neu not overexpressed. Oncotype DX recurrence score of 8  Urinalysis    Component Value Date/Time   COLORURINE YELLOW 12/14/2011 1845   APPEARANCEUR CLEAR 12/14/2011 1845   LABSPEC 1.009 12/14/2011 1845   PHURINE 6.5 12/14/2011 1845   GLUCOSEU NEGATIVE 12/14/2011 1845   HGBUR NEGATIVE 12/14/2011 1845   HGBUR negative 03/28/2009 0844   BILIRUBINUR n 10/27/2012 [place] 12/14/2011 1845   KETONESUR NEGATIVE 12/14/2011 1845   PROTEINUR n 10/27/2012 1157   PROTEINUR NEGATIVE 12/14/2011 1845   UROBILINOGEN 0.2 10/27/2012 1157   UROBILINOGEN 0.2 12/14/2011 1845   NITRITE n 10/27/2012 1157   NITRITE NEGATIVE 12/14/2011 1845   LEUKOCYTESUR Negative 10/27/2012 1157    RADIOGRAPHIC STUDIES: Mm Digital Diagnostic Bilat  06/15/2014   CLINICAL DATA:  History of left lumpectomy with radiation therapy 2009 and 2010.  EXAM: DIGITAL DIAGNOSTIC  BILATERAL MAMMOGRAM WITH CAD  COMPARISON:  05/18/2013 and earlier  ACR Breast Density Category c: The breast tissue is heterogeneously dense, which may obscure small masses.  FINDINGS: Postoperative changes are seen in the left breast. No suspicious mass, distortion, or microcalcifications are identified to suggest presence  of malignancy.  Mammographic images were processed with CAD.  IMPRESSION: No mammographic evidence for malignancy.  RECOMMENDATION: Diagnostic mammogram is suggested in 1 year. (Code:[id number]-B-01Y)  I have discussed the findings and recommendations with the patient. Results were also provided in writing at the conclusion of the visit. If applicable, a reminder letter will be sent to the patient regarding the next appointment.  BI-RADS CATEGORY  2: Benign.   Electronically Signed   By: Shon Hale M.D.   On: 06/15/2014 10:46    ASSESSMENT:  #1. 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 #2. Osteopenia, on PROLIA every 6 months. #3. Hypertension, on treatment. #4. Vitamin D deficiency, on treatment. #5. Hyperlipidemia.   PLAN:  #1. PROLIA 60 mg subcutaneously. #2. Continue self breast examination monthly. #3. Metabolic profile in 6 months for PROLIA injection. #4. Office visit with physical exam, CBC, chem profile, [place] 2729, and CEA in one year. Repeat DEXA scan in October 2016. Repeat mammogram in October 2016.   All questions were answered. The patient knows to call the clinic with any problems, questions or concerns. We can certainly see the patient much sooner if necessary.   I spent 25 minutes counseling the patient face to face. The total time spent in the appointment was 30 minutes.    Doroteo Bradford, MD 07/08/2014 11:02 AM  DISCLAIMER:  This note was dictated with voice recognition software.  Similar sounding words can inadvertently be transcribed inaccurately and may not be corrected upon review.

## 2014-07-08 NOTE — Patient Instructions (Signed)
[facility] Discharge Instructions  RECOMMENDATIONS MADE BY THE CONSULTANT AND ANY TEST RESULTS WILL BE SENT TO YOUR REFERRING PHYSICIAN.  EXAM FINDINGS BY THE PHYSICIAN TODAY AND SIGNS OR SYMPTOMS TO REPORT TO CLINIC OR PRIMARY PHYSICIAN: Exam and findings as discussed by Dr. Barnet Glasgow.  Will give prolia today and will have labs and prolia again in 6 months. Report any new lumps, bone pain, shortness of breath or other symptoms. Mammogram and bone density due 06/2015  MEDICATIONS PRESCRIBED:  none  INSTRUCTIONS/FOLLOW-UP: Follow-up with labs and injection every 6 months. Office visit in 1 year  Thank you for choosing [facility] to provide your oncology and hematology care.  To afford each patient quality time with our providers, please arrive at least 15 minutes before your scheduled appointment time.  With your help, our goal is to use those 15 minutes to complete the necessary work-up to ensure our physicians have the information they need to help with your evaluation and healthcare recommendations.    Effective January 1st, 2014, we ask that you re-schedule your appointment with our physicians should you arrive 10 or more minutes late for your appointment.  We strive to give you quality time with our providers, and arriving late affects you and other patients whose appointments are after yours.    Again, thank you for choosing [facility].  Our hope is that these requests will decrease the amount of time that you wait before being seen by our physicians.       _____________________________________________________________  Should you have questions after your visit to [facility], please contact our office at [phone] between the hours of 8:30 a.m. and 4:30 p.m.  Voicemails left after 4:30 p.m. will not be returned until the following business day.  For prescription refill requests, have your pharmacy contact our office with  your prescription refill request.    _______________________________________________________________  We hope that we have given you very good care.  You may receive a patient satisfaction survey in the mail, please complete it and return it as soon as possible.  We value your feedback!  _______________________________________________________________  Have you asked about our STAR program?  STAR stands for Survivorship Training and Rehabilitation, and this is a nationally recognized cancer care program that focuses on survivorship and rehabilitation.  Cancer and cancer treatments may cause problems, such as, pain, making you feel tired and keeping you from doing the things that you need or want to do. Cancer rehabilitation can help. Our goal is to reduce these troubling effects and help you have the best quality of life possible.  You may receive a survey from a nurse that asks questions about your current state of health.  Based on the survey results, all eligible patients will be referred to the [facility] program for an evaluation so we can better serve you!  A frequently asked questions sheet is available upon request.

## 2014-07-26 ENCOUNTER — Telehealth: Payer: Self-pay | Admitting: Gastroenterology

## 2014-07-26 NOTE — Telephone Encounter (Signed)
I did not find a recall. I found the biopsy from 02/13/08-"POSSIBLE APPENDICEAL MASS, BIOPSY: INFLAMED COLONIC MUCOSA , NO EVIDENCE OF ADENOMATOUS CHANGES OR MALIGNANCY." Please review and let me know when she should have a colonoscopy. She has a history of breast cancer. Thanks.

## 2014-07-26 NOTE — Telephone Encounter (Signed)
2019  

## 2014-07-26 NOTE — Telephone Encounter (Signed)
I have left message for the patient to call back 

## 2014-07-27 NOTE — Telephone Encounter (Signed)
Patient advised.

## 2014-09-14 ENCOUNTER — Encounter (HOSPITAL_COMMUNITY): Payer: Self-pay | Admitting: Emergency Medicine

## 2014-09-14 ENCOUNTER — Emergency Department (HOSPITAL_COMMUNITY)
Admission: EM | Admit: 2014-09-14 | Discharge: 2014-09-14 | Disposition: A | Discharge status: Home / Self Care | Payer: Medicare Other | Attending: Emergency Medicine | Admitting: Emergency Medicine

## 2014-09-14 ENCOUNTER — Emergency Department (HOSPITAL_COMMUNITY): Payer: Medicare Other

## 2014-09-14 DIAGNOSIS — R079 Chest pain, unspecified: Secondary | ICD-10-CM | POA: Diagnosis not present

## 2014-09-14 DIAGNOSIS — Z8589 Personal history of malignant neoplasm of other organs and systems: Secondary | ICD-10-CM | POA: Diagnosis not present

## 2014-09-14 DIAGNOSIS — Z7982 Long term (current) use of aspirin: Secondary | ICD-10-CM | POA: Diagnosis not present

## 2014-09-14 DIAGNOSIS — M1990 Unspecified osteoarthritis, unspecified site: Secondary | ICD-10-CM | POA: Insufficient documentation

## 2014-09-14 DIAGNOSIS — E780 Pure hypercholesterolemia: Secondary | ICD-10-CM | POA: Diagnosis not present

## 2014-09-14 DIAGNOSIS — E785 Hyperlipidemia, unspecified: Secondary | ICD-10-CM | POA: Insufficient documentation

## 2014-09-14 DIAGNOSIS — Z8669 Personal history of other diseases of the nervous system and sense organs: Secondary | ICD-10-CM | POA: Diagnosis not present

## 2014-09-14 DIAGNOSIS — Z79899 Other long term (current) drug therapy: Secondary | ICD-10-CM | POA: Diagnosis not present

## 2014-09-14 DIAGNOSIS — I10 Essential (primary) hypertension: Secondary | ICD-10-CM | POA: Diagnosis not present

## 2014-09-14 DIAGNOSIS — M8580 Other specified disorders of bone density and structure, unspecified site: Secondary | ICD-10-CM | POA: Diagnosis not present

## 2014-09-14 DIAGNOSIS — Z853 Personal history of malignant neoplasm of breast: Secondary | ICD-10-CM | POA: Insufficient documentation

## 2014-09-14 HISTORY — DX: Macular cyst, hole, or pseudohole, right eye: H35.341

## 2014-09-14 LAB — COMPREHENSIVE METABOLIC PANEL
ALT: 28 U/L (ref 0–35)
AST: 27 U/L (ref 0–37)
Albumin: 4.6 g/dL (ref 3.5–5.2)
Alkaline Phosphatase: 67 U/L (ref 39–117)
Anion gap: 5 (ref 5–15)
BUN: 17 mg/dL (ref 6–23)
CALCIUM: 9.4 mg/dL (ref 8.4–10.5)
CO2: 29 mmol/L (ref 19–32)
CREATININE: 0.75 mg/dL (ref 0.50–1.10)
Chloride: 106 mEq/L (ref 96–112)
GFR, EST NON AFRICAN AMERICAN: 86 mL/min — AB (ref >90)
Glucose, Bld: 101 mg/dL — ABNORMAL HIGH (ref 70–99)
Potassium: 3.8 mmol/L (ref 3.5–5.1)
SODIUM: 140 mmol/L (ref 135–145)
Total Bilirubin: 0.5 mg/dL (ref 0.3–1.2)
Total Protein: 7.2 g/dL (ref 6.0–8.3)

## 2014-09-14 LAB — CBC
HCT: 43.7 % (ref 36.0–46.0)
Hemoglobin: 14.8 g/dL (ref 12.0–15.0)
MCH: 32.1 pg (ref 26.0–34.0)
MCHC: 33.9 g/dL (ref 30.0–36.0)
MCV: 94.8 fL (ref 78.0–100.0)
PLATELETS: 241 10*3/uL (ref 150–400)
RBC: 4.61 MIL/uL (ref 3.87–5.11)
RDW: 11.9 % (ref 11.5–15.5)
WBC: 7.7 10*3/uL (ref 4.0–10.5)

## 2014-09-14 LAB — PROTIME-INR
INR: 0.99 (ref 0.00–1.49)
Prothrombin Time: 13.2 seconds (ref 11.6–15.2)

## 2014-09-14 LAB — APTT: aPTT: 31 seconds (ref 24–37)

## 2014-09-14 LAB — TROPONIN I: Troponin I: <0.03 ng/mL (ref <0.031)

## 2014-09-14 MED ORDER — ASPIRIN 81 MG PO CHEW
324.0000 mg | CHEWABLE_TABLET | Freq: Once | ORAL | Status: AC
  Administered 2014-09-14: 324 mg via ORAL
  Filled 2014-09-14: qty 4

## 2014-09-14 MED ORDER — SODIUM CHLORIDE 0.9 % IV SOLN
1000.0000 mL | INTRAVENOUS | Status: DC
  Administered 2014-09-14: 1000 mL via INTRAVENOUS

## 2014-09-14 NOTE — ED Provider Notes (Signed)
CSN: [id number]     Arrival date & time 09/14/14  1516 History   First MD Initiated Contact with Patient 09/14/14 1532     Chief Complaint  Patient presents with  . Chest Pain    Patient is a 69 y.o. female presenting with chest pain. The history is provided by the patient.  Chest Pain Pain location:  Substernal area and L chest Pain quality: pressure and tightness   Pain radiates to:  L arm Pain radiates to the back: no   Pain severity:  Moderate Onset quality:  Sudden (Ongoing for the last few days) Duration:  2 minutes Timing:  Intermittent Chronicity:  Recurrent (She has had this before and has attributed it to stress.  She is planning a big trip over seas in the next couple of months.) Context: at rest and stress   Context: not breathing, no drug use and no movement   Context comment:  She is able to go on walks without any discomfort Worsened by:  Nothing tried Ineffective treatments:  None tried Associated symptoms: no abdominal pain, no fever, no heartburn, no nausea, no shortness of breath and not vomiting   Risk factors: high cholesterol and hypertension   Risk factors: no aortic disease, no coronary artery disease, no diabetes mellitus and no smoking   Risk factors comment:  Parents have history of heart disease She has had similar symptoms in the past.  Negative nuclear medicine stress test a few years ago and a more recent negative regular stress test per the patient. No pain or discomfort right now.  She did have another episode earlier today that brought her into the ED. Past Medical History  Diagnosis Date  . Hyperlipidemia   . Osteopenia   . BCC (basal cell carcinoma)   . Breast cancer 2009  . Inflammation of bone current    foot, in a boot for 6 weeks  . Hypertension   . Arthritis     mild  . Arthritis pain   . Tachycardia   . Macular retinal cyst of right eye    Past Surgical History  Procedure Laterality Date  . Bcc equired excision    . Tonsillectomy     . Breast lumpectomy  10/09    breast cancer, left  . Mass excision  7/09    appendix  . [facility]aract [facility]  [facility]  . Breast lumpectomy     Family History  Problem Relation Age of Onset  . Heart attack Mother   . Hypertension Mother   . Heart disease Father   . Heart attack Father   . Hypertension Father   . Colon cancer Neg Hx    History  Substance Use Topics  . Smoking status: Never Smoker   . Smokeless tobacco: Never Used  . Alcohol Use: 1.8 oz/week    3 Glasses of wine per week   OB History    Gravida Para Term Preterm AB TAB SAB Ectopic Multiple Living   3 2 2       2      Review of Systems  Constitutional: Negative for fever.  Respiratory: Negative for shortness of breath.   Cardiovascular: Positive for chest pain.  Gastrointestinal: Negative for heartburn, nausea, vomiting and abdominal pain.  All other systems reviewed and are negative.     Allergies  Lisinopril and Statins  Home Medications   Prior to Admission medications   Medication Sig Start Date End Date Taking? Authorizing  Provider  aspirin 81 MG tablet Take 81 mg by mouth every evening.    Yes Historical Provider, MD  aspirin EC 325 MG tablet Take 650 mg by mouth once as needed for moderate pain.   Yes Historical Provider, MD  Calcium Carbonate-Vitamin D (CALTRATE 600+D) 600-400 MG-UNIT per tablet Take 2 tablets by mouth every evening.    Yes Historical Provider, MD  Cholecalciferol (D 2000) 2000 UNITS TABS Take 1 tablet by mouth every evening.   Yes Historical Provider, MD  co-enzyme Q-10 30 MG capsule Take 400 mg by mouth every evening.    Yes Historical Provider, MD  denosumab (PROLIA) 60 MG/ML SOLN injection Inject 60 mg into the skin every 6 (six) months.    Yes Historical Provider, MD  fish oil-omega-3 fatty acids 1000 MG capsule Take 1 g by mouth every evening.    Yes Historical Provider, MD  ketorolac (ACULAR) 0.5 % ophthalmic solution 1 drop. Place 1 drop into the right  eye 4 (four) times daily. Dispense 90 day supply w/ 3 refills 07/16/12  Yes Historical Provider, MD  Melatonin 3 MG TABS Take 3 mg by mouth at bedtime. Take by mouth nightly.   Yes Historical Provider, MD  metoprolol succinate (TOPROL-XL) 50 MG 24 hr tablet Take 1 tablet (50 mg total) by mouth daily. Take with or immediately following a meal. 11/03/13  Yes Bruce Kendall Flack, MD  Multiple Vitamin (MULTIVITAMIN) tablet Take 1 tablet by mouth daily.     Yes Historical Provider, MD  simvastatin (ZOCOR) 20 MG tablet take 1 tablet by mouth at bedtime 06/24/14  Yes Bruce Kendall Flack, MD  vitamin C (ASCORBIC ACID) 500 MG tablet Take 500 mg by mouth every evening.    Yes Historical Provider, MD  nitroGLYCERIN (NITROSTAT) 0.4 MG SL tablet 0.4 mg. Place 0.4 mg under the tongue every 5 (five) minutes as needed. May take up to 3 doses.    Historical Provider, MD   BP 146/82 mmHg  Pulse 62  Temp(Src) 97.6 F (36.4 C) (Oral)  Resp 21  Ht 5\' 6"  (1.676 m)  Wt 166 lb 11.2 oz (75.615 kg)  BMI 26.92 kg/m2  SpO2 93% Physical Exam  Constitutional: She appears well-developed and well-nourished. No distress.  HENT:  Head: Normocephalic and atraumatic.  Right Ear: External ear normal.  Left Ear: External ear normal.  Eyes: Conjunctivae are normal. Right eye exhibits no discharge. Left eye exhibits no discharge. No scleral icterus.  Neck: Neck supple. No tracheal deviation present.  Cardiovascular: Normal rate, regular rhythm and intact distal pulses.   Pulmonary/Chest: Effort normal and breath sounds normal. No stridor. No respiratory distress. She has no wheezes. She has no rales.  Abdominal: Soft. Bowel sounds are normal. She exhibits no distension. There is no tenderness. There is no rebound and no guarding.  Musculoskeletal: She exhibits no edema or tenderness.  Neurological: She is alert. She has normal strength. No cranial nerve deficit (no facial droop, extraocular movements intact, no slurred speech) or sensory  deficit. She exhibits normal muscle tone. She displays no seizure activity. Coordination normal.  Skin: Skin is warm and dry. No rash noted.  Psychiatric: She has a normal mood and affect.  Nursing note and vitals reviewed.   ED Course  Procedures (including critical care time) Labs Review Labs Reviewed  COMPREHENSIVE METABOLIC PANEL - Abnormal; Notable for the following:    Glucose, Bld 101 (*)    GFR calc non Af Amer 86 (*)    All  other components within normal limits  APTT  CBC  PROTIME-INR  TROPONIN I  TROPONIN I    Imaging Review Dg Chest 2 View  09/14/2014   CLINICAL DATA:  69 year old female with midsternal chest pain radiating to the left upper extremity. Tachycardia. Initial encounter.  EXAM: CHEST  2 VIEW  COMPARISON:  05/16/2013 and earlier.  FINDINGS: Stable cardiomegaly and mediastinal contours. Stable lung volumes. No pneumothorax, pulmonary edema, pleural effusion or acute pulmonary opacity. Mild curvilinear scarring in the lingula is unchanged. No acute osseous abnormality identified. Visualized tracheal air column is within normal limits.  IMPRESSION: No acute cardiopulmonary abnormality.   Electronically Signed   By: Lars Pinks M.D.   On: 09/14/2014 16:34     EKG Interpretation   Date/Time:  Tuesday September 14 2014 15:22:32 EST Ventricular Rate:  78 PR Interval:  168 QRS Duration: 90 QT Interval:  416 QTC Calculation: 474 R Axis:   25 Text Interpretation:  Normal sinus rhythm Left ventricular hypertrophy  Abnormal ECG No significant change since last tracing Confirmed by Rehanna Oloughlin   MD-J, Milee Qualls ([id number]) on 09/14/2014 3:33:08 PM      MDM   Final diagnoses:  Chest pain, unspecified chest pain type    The patient has had similar episodes in the past with negative stress tests.  The history is concerning however for possible ACS etiology.    Discussed admission for further evaluation.  I explained I could not rule out a cardiac etiology for her symptoms.  Pt does  not want to be admitted to the hospital.  2 sets of enzymes are negative.  Prior episodes in the past with negative workup is somewhat reassuring.  Pt will follow up with her cardiologist for outpatient stress test.  Warning signs and precautions discussed regarding return to the ED.   Dorie Rank, MD 09/14/14 [phone]

## 2014-09-14 NOTE — ED Notes (Signed)
Patient c/o mid-sternal chest "thightness" that radiates into left arm. Patient denies any shortness of breath, weakness, or dizziness. Per patient only cardiac hx is tachycardia.

## 2014-09-14 NOTE — ED Notes (Signed)
Pt aware that next troponin draw and test will be at 1848.

## 2014-09-14 NOTE — Discharge Instructions (Signed)

## 2014-09-16 ENCOUNTER — Encounter: Payer: Self-pay | Admitting: Family Medicine

## 2014-09-16 ENCOUNTER — Telehealth: Payer: Self-pay

## 2014-09-16 NOTE — Telephone Encounter (Signed)
Create slot 4 pm on 09/23/13 and have patient in for ER follow up

## 2014-09-16 NOTE — Telephone Encounter (Signed)
Pt has been scheduled.  °

## 2014-09-16 NOTE — Telephone Encounter (Signed)
Dr. Yong Channel would like a slot created for this pt on 09/23/13 at 4pm for an ER follow up. Please call and schedule pt.

## 2014-09-17 ENCOUNTER — Telehealth: Payer: Self-pay | Admitting: Internal Medicine

## 2014-09-17 NOTE — Telephone Encounter (Signed)
Pt called to say she can not come on 09/23/14. Dr Yong Channel had you Angela [name] to send me a message to add her at 4pm on that day. She said she will be out of town. She said she can come the week of 09/27/14. Can you ask Dr Yong Channel if he want to add her at 4pm that week.

## 2014-09-17 NOTE — Telephone Encounter (Signed)
I thought rachel already put her in for 4pm on the 18th? Just check with her to make sure there were not 2 people we were trying to fit in this spot

## 2014-09-17 NOTE — Telephone Encounter (Signed)
Please advise 

## 2014-09-18 ENCOUNTER — Encounter: Payer: Self-pay | Admitting: Family Medicine

## 2014-09-20 NOTE — Telephone Encounter (Signed)
Patient will be seen 10/01/14 and Dr Yong Channel is aware.

## 2014-09-20 NOTE — Telephone Encounter (Signed)
Angela [name], I thought you handled this for putting her in at 4pm on 18th?

## 2014-09-20 NOTE — Telephone Encounter (Signed)
appt has been scheduled for 10/01/14 at 2 pm

## 2014-09-23 ENCOUNTER — Ambulatory Visit: Payer: 59 | Admitting: Family Medicine

## 2014-09-28 ENCOUNTER — Encounter: Payer: Self-pay | Admitting: Family Medicine

## 2014-09-30 ENCOUNTER — Telehealth: Payer: Self-pay | Admitting: Internal Medicine

## 2014-09-30 NOTE — Telephone Encounter (Signed)
She already has a to be establish appointment in March.  10/01/14 appointment is for a post ER follow-up.  She knows it's suppose to be 30 mins but is requesting a 15 min post ER follow up appointment for next week if she has to cancel tomorrow due to the weather.

## 2014-09-30 NOTE — Telephone Encounter (Signed)
Patient would like to know if Dr. Yong Channel can see her for 15 minutes next week if she can't make it on 10/01/14 due to the inclement weather?? Please advise.

## 2014-09-30 NOTE — Telephone Encounter (Signed)
Tonya see below

## 2014-09-30 NOTE — Telephone Encounter (Signed)
She can be seen for 15 minute slot but would need a later establish visit 30 minutes with me. May need this even with 30 minutes depending on complexity of visit and # of needs. Do not take a same day slot out for next week though.

## 2014-09-30 NOTE — Telephone Encounter (Signed)
Dr. Yong Channel pt is originally in a 30 min slot for an ER f/u. Is it ok for her to be moved to a 15 min slot b/c she does not want to be seen for 1min. Please advise.

## 2014-10-01 ENCOUNTER — Ambulatory Visit: Payer: 59 | Admitting: Family Medicine

## 2014-10-05 ENCOUNTER — Ambulatory Visit (INDEPENDENT_AMBULATORY_CARE_PROVIDER_SITE_OTHER): Payer: Medicare Other | Admitting: Cardiovascular Disease

## 2014-10-05 ENCOUNTER — Encounter: Payer: Self-pay | Admitting: Cardiovascular Disease

## 2014-10-05 VITALS — BP 158/94 | HR 86 | Ht 66.0 in | Wt 167.0 lb

## 2014-10-05 DIAGNOSIS — R0789 Other chest pain: Secondary | ICD-10-CM

## 2014-10-05 DIAGNOSIS — I10 Essential (primary) hypertension: Secondary | ICD-10-CM

## 2014-10-05 DIAGNOSIS — R079 Chest pain, unspecified: Secondary | ICD-10-CM

## 2014-10-05 MED ORDER — POTASSIUM CHLORIDE CRYS ER 10 MEQ PO TBCR: 10.0000 meq | EXTENDED_RELEASE_TABLET | Freq: Every day | ORAL | Status: DC

## 2014-10-05 MED ORDER — HYDROCHLOROTHIAZIDE 25 MG PO TABS: 25.0000 mg | ORAL_TABLET | Freq: Every day | ORAL | Status: DC

## 2014-10-05 NOTE — Patient Instructions (Signed)
Your physician has recommended you make the following change in your medication:   START TAKING HYDROCHLOROTHIAZIDE 25 MG ONCE DAILY  START TAKING POTASSIUM CHLORIDE 10 mEq ONCE DAILY   Your physician recommends that you return for lab work in: [place] has requested that you have an exercise tolerance test. For further information please visit [email or web address]. Please also follow instruction sheet, as given. PLEASE SCHEDULE THIS AT [place] RESIDES CLOSE TO THIS LOCATION   Your physician wants you to follow-up in: [place] will receive a reminder letter in the mail two months in advance. If you don't receive a letter, please call our office to schedule the follow-up appointment.

## 2014-10-05 NOTE — Progress Notes (Signed)
Cardiology Office Note   Date:  10/05/2014   ID:  Angela [name], DOB 03-17-1946, MRN [id number]  PCP:  Chancy Hurter, MD  Cardiologist:   Thayer Headings, MD   Chief Complaint  Patient presents with  . Hypertension    post hospital   Problem List 1. Chest pain  2. Breast Cancer 3, Hypertension. 4. Hyperlipidemia 5. Macular cyst ( right eye)    History of Present Illness: Angela [name] is a 69 y.o. female who presents for  Who I have seen for evaluation for chest pain in 2013. She had a negative myoview at that time  She has continued to have some some tachycardia and intermittant chest pain.  Also has HTN. Since Christmas , has had some chest tightness in her left breast   CP do not last very long - perhaps ,  Last a few seconds. Felt like she has a knot in her breast  Tightness in her right neck  She exercises on occasion - twice a week - goes to the [facility].  These CP do not worsen with exertion.  Not affected with eating or drinking or change of position.  Has indigestion / GERD on occasion.    BP has been elevated most of her readings.   Past Medical History  Diagnosis Date  . Hyperlipidemia   . Osteopenia   . BCC (basal cell carcinoma)   . Breast cancer 2009  . Inflammation of bone current    foot, in a boot for 6 weeks  . Hypertension   . Arthritis     mild  . Arthritis pain   . Tachycardia   . Macular retinal cyst of right eye     Past Surgical History  Procedure Laterality Date  . Bcc equired excision    . Tonsillectomy    . Breast lumpectomy  10/09    breast cancer, left  . Mass excision  7/09    appendix  . [facility]aract [facility]  . Breast lumpectomy       Current Outpatient Prescriptions  Medication Sig Dispense Refill  . aspirin 81 MG tablet Take 81 mg by mouth every evening.     . Calcium Carbonate-Vitamin D (CALTRATE 600+D) 600-400 MG-UNIT per tablet Take 2 tablets by mouth every evening.     .  Cholecalciferol (D 2000) 2000 UNITS TABS Take 1 tablet by mouth every evening.    Marland Kitchen co-enzyme Q-10 30 MG capsule Take 400 mg by mouth every evening.     . denosumab (PROLIA) 60 MG/ML SOLN injection Inject 60 mg into the skin every 6 (six) months.     Marland Kitchen denosumab (PROLIA) 60 MG/ML SOLN injection Inject into the skin every 6 (six) months. prolia injection    . fish oil-omega-3 fatty acids 1000 MG capsule Take 1 g by mouth every evening.     Marland Kitchen ketorolac (ACULAR) 0.5 % ophthalmic solution 1 drop. Place 1 drop into the right eye 4 (four) times daily. Dispense 90 day supply w/ 3 refills    . Melatonin 3 MG TABS Take 3 mg by mouth at bedtime. Take by mouth nightly.    . metoprolol succinate (TOPROL-XL) 50 MG 24 hr tablet Take 1 tablet (50 mg total) by mouth daily. Take with or immediately following a meal. 90 tablet 3  . Multiple Vitamin (MULTIVITAMIN) tablet Take 1 tablet by mouth daily.      . nitroGLYCERIN (NITROSTAT) 0.4 MG  SL tablet 0.4 mg. Place 0.4 mg under the tongue every 5 (five) minutes as needed. May take up to 3 doses.    . simvastatin (ZOCOR) 20 MG tablet take 1 tablet by mouth at bedtime 90 tablet 1  . vitamin C (ASCORBIC ACID) 500 MG tablet Take 500 mg by mouth every evening.      No current facility-administered medications for this visit.    Allergies:   Lisinopril and Statins    Social History:  The patient  reports that she has never smoked. She has never used smokeless tobacco. She reports that she drinks about 1.8 oz of alcohol per week. She reports that she does not use illicit drugs.   Family History:  The patient's family history includes Heart attack in her father and mother; Heart disease in her father; Hypertension in her father and mother. There is no history of Colon cancer or Stroke.    ROS:  Please see the history of present illness.   Otherwise, review of systems are positive for none.   All other systems are reviewed and negative.    PHYSICAL EXAM: VS:  BP  158/94 mmHg  Pulse 86  Ht 5\' 6"  (1.676 m)  Wt 167 lb (75.751 kg)  BMI 26.97 kg/m2 , BMI Body mass index is 26.97 kg/(m^2). GEN: Well nourished, well developed, in no acute distress HEENT: normal Neck: no JVD, carotid bruits, or masses Cardiac: RRR; no murmurs, rubs, or gallops,no edema  Respiratory:  clear to auscultation bilaterally, normal work of breathing GI: soft, nontender, nondistended, + BS MS: no deformity or atrophy Skin: warm and dry, no rash Neuro:  Strength and sensation are intact Psych: normal   EKG:  EKG is ordered today. The ekg ordered today demonstrates NSR at 86.    Recent Labs: 11/03/2013: TSH 1.11 09/14/2014: ALT 28; BUN 17; Creatinine 0.75; Hemoglobin 14.8; Platelets 241; Potassium 3.8; Sodium 140    Lipid Panel    Component Value Date/Time   CHOL 171 11/03/2013 0953   TRIG 133.0 11/03/2013 0953   HDL 40.10 11/03/2013 0953   CHOLHDL 4 11/03/2013 0953   VLDL 26.6 11/03/2013 0953   LDLCALC 104* 11/03/2013 0953   LDLDIRECT 170.3 07/09/2013 0813      Wt Readings from Last 3 Encounters:  10/05/14 167 lb (75.751 kg)  09/14/14 166 lb 11.2 oz (75.615 kg)  07/08/14 169 lb 12.8 oz (77.021 kg)      Other studies Reviewed: Additional studies/ records that were reviewed today include: .previous myoview Review of the above records demonstrates:  No ischemia   ASSESSMENT AND PLAN:  1.  Chest discomfort:  Her CP sounds very atypical .  Not related to exertion.  She works out at [employer] and does well. Will get a GXT - I doubt her insurance will pay for a myoview   2. HTN:  Will add HCTZ 25 a day and Kdur 10 meq a day.  Check BMP in 3 weeks. Will see her in 6 months.     Current medicines are reviewed at length with the patient today.  The patient does not have concerns regarding medicines.  The following changes have been made:  no change  Labs/ tests ordered today include:  No orders of the defined types were placed in this encounter.      Disposition:   FU with me in  6 months for follow up office visit.    Signed, Franciszek Platten, Wonda Cheng, MD  10/05/2014 4:07 PM  [facility] [address], [place], [place]  [address] Phone: [phone]; Fax: [phone]

## 2014-10-07 ENCOUNTER — Encounter: Payer: Self-pay | Admitting: Family Medicine

## 2014-10-12 ENCOUNTER — Ambulatory Visit (HOSPITAL_COMMUNITY)
Admission: RE | Admit: 2014-10-12 | Discharge: 2014-10-12 | Disposition: A | Discharge status: Home / Self Care | Payer: Medicare Other | Source: Ambulatory Visit | Attending: Cardiovascular Disease | Admitting: Cardiovascular Disease

## 2014-10-12 ENCOUNTER — Encounter (HOSPITAL_COMMUNITY): Payer: Self-pay

## 2014-10-12 DIAGNOSIS — R079 Chest pain, unspecified: Secondary | ICD-10-CM | POA: Insufficient documentation

## 2014-10-12 NOTE — Progress Notes (Addendum)
Stress Lab Nurses Notes - [name] 10/12/2014 Reason for doing test: Chest Pain Type of test: Regular GTX Nurse performing test: Gerrit Halls, RN Nuclear Medicine Tech: Not Applicable Echo Tech: Not Applicable MD performing test: Gaynelle Cage MD: [place] Test explained and consent signed: Yes.   IV started: No IV started Symptoms: Fatigue & SOB Treatment/Intervention: None Reason test stopped: fatigue and reached target HR After recovery IV was: NA Patient to return to Nuc. Med at :NA Patient discharged: Home Patient's Condition upon discharge was: stable Comments: During test peak BP 188/94 & HR 155. Recovery BP 157/89 & HR 89.  Symptoms resolved in recovery. [name]   ATTENDING PHYSICIAN ADDENDUM: The patient exercised for 7:47, achieving 10.1 METS and 110% of the maximal, age-predicted heart rate. No chest pain was reported. Baseline ECG demonstrated normal sinus rhythm. With exercise, one isolated PVC was noted. There were no ischemic ST-T abnormalities. This represents a normal exercise treadmill stress test, predicting a low risk for major adverse cardiac events.

## 2014-10-13 ENCOUNTER — Other Ambulatory Visit: Payer: Self-pay | Admitting: Internal Medicine

## 2014-10-22 ENCOUNTER — Other Ambulatory Visit: Payer: Medicare Other

## 2014-10-26 ENCOUNTER — Other Ambulatory Visit (INDEPENDENT_AMBULATORY_CARE_PROVIDER_SITE_OTHER): Payer: Medicare Other | Admitting: *Deleted

## 2014-10-26 DIAGNOSIS — I10 Essential (primary) hypertension: Secondary | ICD-10-CM

## 2014-10-26 DIAGNOSIS — R079 Chest pain, unspecified: Secondary | ICD-10-CM

## 2014-10-26 LAB — BASIC METABOLIC PANEL
BUN: 18 mg/dL (ref 6–23)
CHLORIDE: 104 meq/L (ref 96–112)
CO2: 33 meq/L — AB (ref 19–32)
CREATININE: 0.67 mg/dL (ref 0.40–1.20)
Calcium: 9.6 mg/dL (ref 8.4–10.5)
GFR: 93.08 mL/min (ref >60.00)
Glucose, Bld: 99 mg/dL (ref 70–99)
Potassium: 4.1 mEq/L (ref 3.5–5.1)
Sodium: 139 mEq/L (ref 135–145)

## 2014-11-04 ENCOUNTER — Ambulatory Visit: Payer: Medicare Other | Admitting: Family Medicine

## 2014-11-12 ENCOUNTER — Encounter: Payer: Self-pay | Admitting: Family Medicine

## 2014-11-12 ENCOUNTER — Ambulatory Visit (INDEPENDENT_AMBULATORY_CARE_PROVIDER_SITE_OTHER): Payer: Medicare Other | Admitting: Family Medicine

## 2014-11-12 VITALS — BP 136/86 | Temp 98.2°F | Wt 166.0 lb

## 2014-11-12 DIAGNOSIS — M8580 Other specified disorders of bone density and structure, unspecified site: Secondary | ICD-10-CM

## 2014-11-12 DIAGNOSIS — Z23 Encounter for immunization: Secondary | ICD-10-CM

## 2014-11-12 DIAGNOSIS — E785 Hyperlipidemia, unspecified: Secondary | ICD-10-CM

## 2014-11-12 DIAGNOSIS — R739 Hyperglycemia, unspecified: Secondary | ICD-10-CM | POA: Insufficient documentation

## 2014-11-12 DIAGNOSIS — I10 Essential (primary) hypertension: Secondary | ICD-10-CM

## 2014-11-12 LAB — COMPREHENSIVE METABOLIC PANEL
ALBUMIN: 4.6 g/dL (ref 3.5–5.2)
ALT: 25 U/L (ref 0–35)
AST: 27 U/L (ref 0–37)
Alkaline Phosphatase: 63 U/L (ref 39–117)
BUN: 18 mg/dL (ref 6–23)
CO2: 33 mEq/L — ABNORMAL HIGH (ref 19–32)
Calcium: 10.1 mg/dL (ref 8.4–10.5)
Chloride: 104 mEq/L (ref 96–112)
Creatinine, Ser: 0.74 mg/dL (ref 0.40–1.20)
GFR: 82.98 mL/min (ref >60.00)
GLUCOSE: 103 mg/dL — AB (ref 70–99)
POTASSIUM: 4.8 meq/L (ref 3.5–5.1)
SODIUM: 140 meq/L (ref 135–145)
TOTAL PROTEIN: 7.1 g/dL (ref 6.0–8.3)
Total Bilirubin: 0.5 mg/dL (ref 0.2–1.2)

## 2014-11-12 LAB — LIPID PANEL
CHOL/HDL RATIO: 4
Cholesterol: 153 mg/dL (ref 0–200)
HDL: 36.5 mg/dL — ABNORMAL LOW (ref >39.00)
LDL Cholesterol: 84 mg/dL (ref 0–99)
NONHDL: 116.5
Triglycerides: 161 mg/dL — ABNORMAL HIGH (ref 0.0–149.0)
VLDL: 32.2 mg/dL (ref 0.0–40.0)

## 2014-11-12 LAB — CBC
HCT: 43.7 % (ref 36.0–46.0)
HEMOGLOBIN: 15 g/dL (ref 12.0–15.0)
MCHC: 34.3 g/dL (ref 30.0–36.0)
MCV: 92.2 fl (ref 78.0–100.0)
PLATELETS: 221 10*3/uL (ref 150.0–400.0)
RBC: 4.74 Mil/uL (ref 3.87–5.11)
RDW: 12 % (ref 11.5–15.5)
WBC: 6.3 10*3/uL (ref 4.0–10.5)

## 2014-11-12 LAB — TSH: TSH: 1.7 u[IU]/mL (ref 0.35–4.50)

## 2014-11-12 NOTE — Patient Instructions (Addendum)
Prevnar 13 today  We will update all of your bloodwork today.   Let's see each other back for my next available physical in likely June or July but I am here for you if you need anything before that time.   By the time I see you back, i would LOVE to see you exercising at least 3x a week for 35 minutes

## 2014-11-12 NOTE — Assessment & Plan Note (Signed)
LDL shows excellent control today <100. Continue simvastatin 20mg 

## 2014-11-12 NOTE — Progress Notes (Signed)
Angela Reddish, MD Phone: [phone]  Subjective:  Patient presents today to establish care with me as their new primary care provider. Patient was formerly a patient of Dr. Leanne Chang. Chief complaint-noted.   Hypertension-reasonable control  BP Readings from Last 3 Encounters:  11/12/14 136/86  10/05/14 158/94  09/14/14 154/95  Home BP [id number] to 427 systolic most of the time has had 2 readings slightly above that, diastolic <06  Compliant with medications-yes without side effects except increased urination Exercising 2x a week at the gym (advised to increase ROS-Denies any HA, SOB, blurry vision, LE edema. Has had some chest pain evaluated by cardiology with negative stress test.   Osteopenia- stable -had been on arimedex and noted to have osteopenia by oncology and started on prolia. She is getting injections every 6 months ROS- no recent fractures or bone pain  Hyperlipidemia-now well controlled  Lab Results  Component Value Date   LDLCALC 84 11/12/2014  On statin: simvastatin 20mg  Regular exercise: 2x a week, advised increse Diet: overeating ROS- noshortness of breath. No myalgias   The following were reviewed and entered/updated in epic: Past medical history Patient Active Problem List   Diagnosis Date Noted  . Breast cancer 03/20/2011    Priority: High  . Osteopenia determined by x-ray 06/04/2014    Priority: Medium  . HTN (hypertension) 07/13/2008    Priority: Medium  . Hyperlipidemia 07/09/2007    Priority: Medium  . Chest discomfort 10/05/2014    Priority: Low  . 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 Vitamin D deficiency 07/09/2007    Priority: Low   Medications- reviewed and updated Current Outpatient Prescriptions  Medication Sig Dispense Refill  . aspirin 81 MG tablet Take 81 mg by mouth every evening.     . Calcium Carbonate-Vitamin D (CALTRATE 600+D) 600-400 MG-UNIT per tablet Take 2 tablets by mouth every evening.     .  Cholecalciferol (D 2000) 2000 UNITS TABS Take 1 tablet by mouth every evening.    Marland Kitchen co-enzyme Q-10 30 MG capsule Take 400 mg by mouth every evening.     . denosumab (PROLIA) 60 MG/ML SOLN injection Inject 60 mg into the skin every 6 (six) months.     . fish oil-omega-3 fatty acids 1000 MG capsule Take 1 g by mouth every evening.     . hydrochlorothiazide (HYDRODIURIL) 25 MG tablet Take 1 tablet (25 mg total) by mouth daily. 90 tablet 3  . ketorolac (ACULAR) 0.5 % ophthalmic solution 1 drop. Place 1 drop into the right eye 4 (four) times daily. Dispense 90 day supply w/ 3 refills    . Melatonin 3 MG TABS Take 3 mg by mouth at bedtime. Take by mouth nightly.    . metoprolol succinate (TOPROL-XL) 50 MG 24 hr tablet take 1 tablet by mouth once daily TAKE WITH FOOD OR FOLLOWING A MEAL 90 tablet 0  . Multiple Vitamin (MULTIVITAMIN) tablet Take 1 tablet by mouth daily.      . potassium chloride (K-DUR,KLOR-CON) 10 MEQ tablet Take 1 tablet (10 mEq total) by mouth daily. 60 tablet 3  . simvastatin (ZOCOR) 20 MG tablet take 1 tablet by mouth at bedtime 90 tablet 1  . vitamin C (ASCORBIC ACID) 500 MG tablet Take 500 mg by mouth every evening.     . nitroGLYCERIN (NITROSTAT) 0.4 MG SL tablet 0.4 mg. Place 0.4 mg under the tongue every 5 (five) minutes as needed. May take up to 3 doses.  No current facility-administered medications for this visit.   Objective: BP 136/86 mmHg  Temp(Src) 98.2 F (36.8 C)  Wt 166 lb (75.297 kg) Gen: NAD, resting comfortably HEENT: Mucous membranes are moist.  CV: RRR no murmurs rubs or gallops Lungs: CTAB no crackles, wheeze, rhonchi Abdomen: soft/nontender/nondistended/normal bowel sounds. No rebound or guarding. Oveweight.  Ext: no edema, 2+ PT pulses Skin: warm, dry, no rash Neuro: grossly normal, moves all extremities, normal gait   Assessment/Plan:  HTN (hypertension) Controlled on HCTZ 25mg , metoprolol 50mg  XL. A few elevated readings at home. Would like to  target <140/90 and gave return precautions for home checks.   Osteopenia determined by x-ray Had been on arimedex and later developed osteopenia and placed on prolia. This has been managed by oncology. I am not sure if there is a reason for prolia over bisphosphonate's and would ask oncology's opinion next time they see patient. We discussed considering a repeat BMD about 2 years out from start and considering changing to fosamax but would want oncology input first. Another question would be duration of prolia treatment.    Hyperlipidemia LDL shows excellent control today <100. Continue simvastatin 20mg    Follow up for next available CPE though we updated fasting labs today. We did not have a chance to review full history beyond meds/problem list and will need to do so at that time.   Orders Placed This Encounter  Procedures  . Pneumococcal conjugate vaccine 13-valent   At risk diabetes off fasting cbg 103 Results for orders placed or performed in visit on 11/12/14 (from the past 24 hour(s))  CBC     Status: None   Collection Time: 11/12/14  9:10 AM  Result Value Ref Range   WBC 6.3 4.0 - 10.5 K/uL   RBC 4.74 3.87 - 5.11 Mil/uL   Platelets 221.0 150.0 - 400.0 K/uL   Hemoglobin 15.0 12.0 - 15.0 g/dL   HCT 43.7 36.0 - 46.0 %   MCV 92.2 78.0 - 100.0 fl   MCHC 34.3 30.0 - 36.0 g/dL   RDW 12.0 11.5 - 15.5 %  Comprehensive metabolic panel     Status: Abnormal   Collection Time: 11/12/14  9:10 AM  Result Value Ref Range   Sodium 140 135 - 145 mEq/L   Potassium 4.8 3.5 - 5.1 mEq/L   Chloride 104 96 - 112 mEq/L   CO2 33 (H) 19 - 32 mEq/L   Glucose, Bld 103 (H) 70 - 99 mg/dL   BUN 18 6 - 23 mg/dL   Creatinine, Ser 0.74 0.40 - 1.20 mg/dL   Total Bilirubin 0.5 0.2 - 1.2 mg/dL   Alkaline Phosphatase 63 39 - 117 U/L   AST 27 0 - 37 U/L   ALT 25 0 - 35 U/L   Total Protein 7.1 6.0 - 8.3 g/dL   Albumin 4.6 3.5 - 5.2 g/dL   Calcium 10.1 8.4 - 10.5 mg/dL   GFR 82.98 >60.00 mL/min  Lipid  panel     Status: Abnormal   Collection Time: 11/12/14  9:10 AM  Result Value Ref Range   Cholesterol 153 0 - 200 mg/dL   Triglycerides 161.0 (H) 0.0 - 149.0 mg/dL   HDL 36.50 (L) >39.00 mg/dL   VLDL 32.2 0.0 - 40.0 mg/dL   LDL Cholesterol 84 0 - 99 mg/dL   Total CHOL/HDL Ratio 4    NonHDL 116.50   TSH     Status: None   Collection Time: 11/12/14  9:10 AM  Result Value Ref Range   TSH 1.70 0.35 - 4.50 uIU/mL

## 2014-11-12 NOTE — Assessment & Plan Note (Signed)
Controlled on HCTZ 25mg , metoprolol 50mg  XL. A few elevated readings at home. Would like to target <140/90 and gave return precautions for home checks.

## 2014-11-12 NOTE — Assessment & Plan Note (Signed)
Had been on arimedex and later developed osteopenia and placed on prolia. This has been managed by oncology. I am not sure if there is a reason for prolia over bisphosphonate's and would ask oncology's opinion next time they see patient. We discussed considering a repeat BMD about 2 years out from start and considering changing to fosamax but would want oncology input first. Another question would be duration of prolia treatment.

## 2014-11-15 ENCOUNTER — Encounter: Payer: Self-pay | Admitting: Family Medicine

## 2015-01-05 ENCOUNTER — Other Ambulatory Visit: Payer: Self-pay | Admitting: Internal Medicine

## 2015-01-06 ENCOUNTER — Other Ambulatory Visit (HOSPITAL_COMMUNITY): Payer: Self-pay

## 2015-01-06 DIAGNOSIS — C50919 Malignant neoplasm of unspecified site of unspecified female breast: Secondary | ICD-10-CM

## 2015-01-06 DIAGNOSIS — M8580 Other specified disorders of bone density and structure, unspecified site: Secondary | ICD-10-CM

## 2015-01-07 ENCOUNTER — Encounter (HOSPITAL_COMMUNITY): Payer: Medicare Other

## 2015-01-07 ENCOUNTER — Other Ambulatory Visit (HOSPITAL_COMMUNITY): Payer: Medicare Other

## 2015-01-12 ENCOUNTER — Encounter (HOSPITAL_BASED_OUTPATIENT_CLINIC_OR_DEPARTMENT_OTHER): Payer: Medicare Other

## 2015-01-12 ENCOUNTER — Encounter (HOSPITAL_COMMUNITY): Payer: Medicare Other | Attending: Hematology & Oncology

## 2015-01-12 ENCOUNTER — Encounter (HOSPITAL_COMMUNITY): Payer: Self-pay

## 2015-01-12 DIAGNOSIS — Z86000 Personal history of in-situ neoplasm of breast: Secondary | ICD-10-CM

## 2015-01-12 DIAGNOSIS — M8580 Other specified disorders of bone density and structure, unspecified site: Secondary | ICD-10-CM | POA: Diagnosis not present

## 2015-01-12 DIAGNOSIS — C50919 Malignant neoplasm of unspecified site of unspecified female breast: Secondary | ICD-10-CM

## 2015-01-12 LAB — CBC WITH DIFFERENTIAL/PLATELET
BASOS ABS: 0 10*3/uL (ref 0.0–0.1)
BASOS PCT: 1 % (ref 0–1)
Eosinophils Absolute: 0.2 10*3/uL (ref 0.0–0.7)
Eosinophils Relative: 3 % (ref 0–5)
HCT: 40.7 % (ref 36.0–46.0)
Hemoglobin: 13.6 g/dL (ref 12.0–15.0)
Lymphocytes Relative: 30 % (ref 12–46)
Lymphs Abs: 1.9 10*3/uL (ref 0.7–4.0)
MCH: 31.7 pg (ref 26.0–34.0)
MCHC: 33.4 g/dL (ref 30.0–36.0)
MCV: 94.9 fL (ref 78.0–100.0)
Monocytes Absolute: 0.5 10*3/uL (ref 0.1–1.0)
Monocytes Relative: 9 % (ref 3–12)
Neutro Abs: 3.7 10*3/uL (ref 1.7–7.7)
Neutrophils Relative %: 57 % (ref 43–77)
Platelets: 204 10*3/uL (ref 150–400)
RBC: 4.29 MIL/uL (ref 3.87–5.11)
RDW: 12.1 % (ref 11.5–15.5)
WBC: 6.3 10*3/uL (ref 4.0–10.5)

## 2015-01-12 LAB — COMPREHENSIVE METABOLIC PANEL WITH GFR
ALT: 26 U/L (ref 14–54)
AST: 28 U/L (ref 15–41)
Albumin: 4 g/dL (ref 3.5–5.0)
Alkaline Phosphatase: 53 U/L (ref 38–126)
Anion gap: 6 (ref 5–15)
BUN: 19 mg/dL (ref 6–20)
CO2: 29 mmol/L (ref 22–32)
Calcium: 9.4 mg/dL (ref 8.9–10.3)
Chloride: 104 mmol/L (ref 101–111)
Creatinine, Ser: 0.71 mg/dL (ref 0.44–1.00)
GFR calc Af Amer: >60 mL/min
GFR calc non Af Amer: >60 mL/min
Glucose, Bld: 148 mg/dL — ABNORMAL HIGH (ref 70–99)
Potassium: 3.9 mmol/L (ref 3.5–5.1)
Sodium: 139 mmol/L (ref 135–145)
Total Bilirubin: 0.5 mg/dL (ref 0.3–1.2)
Total Protein: 6.5 g/dL (ref 6.5–8.1)

## 2015-01-12 MED ORDER — DENOSUMAB 60 MG/ML ~~LOC~~ SOLN
60.0000 mg | Freq: Once | SUBCUTANEOUS | Status: AC
  Administered 2015-01-12: 60 mg via SUBCUTANEOUS
  Filled 2015-01-12: qty 1

## 2015-01-12 NOTE — Progress Notes (Unsigned)
Angela [name] presented for labwork. Labs per MD order drawn via Peripheral Line 23 gauge needle inserted in right AC. Good blood return present. Procedure without incident. Needle removed intact. Patient tolerated procedure well.

## 2015-01-12 NOTE — Patient Instructions (Signed)
[place] at [facility] Discharge Instructions  RECOMMENDATIONS MADE BY THE CONSULTANT AND ANY TEST RESULTS WILL BE SENT TO YOUR REFERRING PHYSICIAN.  Prolia injection today. Return as scheduled in 6 months for lab work, office visit, and Prolia injection.  Thank you for choosing [place] at [facility] to provide your oncology and hematology care.  To afford each patient quality time with our provider, please arrive at least 15 minutes before your scheduled appointment time.    You need to re-schedule your appointment should you arrive 10 or more minutes late.  We strive to give you quality time with our providers, and arriving late affects you and other patients whose appointments are after yours.  Also, if you no show three or more times for appointments you may be dismissed from the clinic at the providers discretion.     Again, thank you for choosing [facility].  Our hope is that these requests will decrease the amount of time that you wait before being seen by our physicians.       _____________________________________________________________  Should you have questions after your visit to [facility], please contact our office at [phone] between the hours of 8:30 a.m. and 4:30 p.m.  Voicemails left after 4:30 p.m. will not be returned until the following business day.  For prescription refill requests, have your pharmacy contact our office.

## 2015-01-12 NOTE — Progress Notes (Signed)
1439:  Angela [name] presents today for injection per the provider's orders.  Prolia administration without incident; see MAR for injection details.  Patient tolerated procedure well and without incident.  No questions or complaints noted at this time.

## 2015-01-13 ENCOUNTER — Other Ambulatory Visit: Payer: Self-pay | Admitting: Internal Medicine

## 2015-01-13 LAB — CANCER ANTIGEN 27.29: CA 27.29: 31.9 U/mL (ref 0.0–38.6)

## 2015-02-17 ENCOUNTER — Ambulatory Visit (INDEPENDENT_AMBULATORY_CARE_PROVIDER_SITE_OTHER): Payer: Medicare Other | Admitting: Family Medicine

## 2015-02-17 ENCOUNTER — Encounter: Payer: Self-pay | Admitting: Family Medicine

## 2015-02-17 VITALS — BP 130/90 | HR 76 | Temp 98.5°F | Ht 66.0 in | Wt 168.0 lb

## 2015-02-17 DIAGNOSIS — Z0000 Encounter for general adult medical examination without abnormal findings: Secondary | ICD-10-CM

## 2015-02-17 DIAGNOSIS — I10 Essential (primary) hypertension: Secondary | ICD-10-CM

## 2015-02-17 DIAGNOSIS — K921 Melena: Secondary | ICD-10-CM

## 2015-02-17 DIAGNOSIS — M722 Plantar fascial fibromatosis: Secondary | ICD-10-CM

## 2015-02-17 DIAGNOSIS — R739 Hyperglycemia, unspecified: Secondary | ICD-10-CM

## 2015-02-17 DIAGNOSIS — E785 Hyperlipidemia, unspecified: Secondary | ICD-10-CM

## 2015-02-17 LAB — HEMOGLOBIN A1C: Hgb A1c MFr Bld: 5.6 % (ref 4.6–6.5)

## 2015-02-17 LAB — POC HEMOCCULT BLD/STL (OFFICE/1-CARD/DIAGNOSTIC)
Card #1 Date: NEGATIVE
Fecal Occult Blood, POC: NEGATIVE

## 2015-02-17 MED ORDER — MELOXICAM 7.5 MG PO TABS: 7.5000 mg | ORAL_TABLET | Freq: Every day | ORAL | Status: DC

## 2015-02-17 NOTE — Assessment & Plan Note (Signed)
Fasting cbg elevated but not a1c. Still needs weight loss and weight goal 150 set.

## 2015-02-17 NOTE — Assessment & Plan Note (Signed)
Poor control on metoprolol 50mg  XL. Advised to take hctz everyday but she states the diuretic gets in way of her functions some days. Discussed potentially taking 1/2 pill per day but as cardiology started this- she will discuss with them.

## 2015-02-17 NOTE — Assessment & Plan Note (Signed)
Controlled on simvastatin 20mg . Cramps controlled on coq10.  Lab Results  Component Value Date   CHOL 153 11/12/2014   HDL 36.50* 11/12/2014   LDLCALC 84 11/12/2014   LDLDIRECT 170.3 07/09/2013   TRIG 161.0* 11/12/2014   CHOLHDL 4 11/12/2014

## 2015-02-17 NOTE — Progress Notes (Signed)
Angela Reddish, Angela [name] Phone: [phone]  Subjective:  Patient presents today for their annual physical. Chief complaint-noted.   Check a1c today due to hyperglycemia and fasting CBG >100. Knows at risk DM and discussed weight goal.  Stool checked today- normal. Patient up to date on colonoscopy and knows of little marginal benefit but asks for rectal and stool FOBT which was done and negative L heel pain. Trouble walking. Worst first step in AM.   Breast cancer 2009 s/p lumpectomy, radiation, arimedex. Follows with Dr. Terressa Koyanagi on BP meds by cards with HCTZ. Taking every other day- discussed this is not ideal.   Lipids look great on simvastatin 20mg   Osteopenia managed by oncology.   Otherwise doing well, doing some walking but limited by the heel pain. Doing some non direct impact exercises  ROS- full  review of systems was completed and negative except for as noted above.   The following were reviewed and entered/updated in epic: Past Medical History  Diagnosis Date  . Hyperlipidemia   . Osteopenia   . BCC (basal cell carcinoma)   . Breast cancer 2009  . Inflammation of bone current    foot, in a boot for 6 weeks  . Hypertension   . Arthritis     mild  . Arthritis pain   . Tachycardia   . Macular retinal cyst of right eye    Patient Active Problem List   Diagnosis Date Noted  . Breast cancer 03/20/2011    Priority: High  . Hyperglycemia 11/12/2014    Priority: Medium  . Osteopenia determined by x-ray 06/04/2014    Priority: Medium  . HTN (hypertension) 07/13/2008    Priority: Medium  . Hyperlipidemia 07/09/2007    Priority: Medium  . Chest discomfort 10/05/2014    Priority: Low  . 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 Vitamin D deficiency 07/09/2007    Priority: Low   Past Surgical History  Procedure Laterality Date  . Bcc equired excision    . Tonsillectomy    . Breast lumpectomy  10/09    breast cancer, left  . Mass excision   7/09    appendix  . [facility]act [facility]  . Breast lumpectomy      Family History  Problem Relation Age of Onset  . Heart attack Mother   . Hypertension Mother   . Heart disease Father   . Heart attack Father   . Hypertension Father   . Colon cancer Neg Hx   . Stroke Neg Hx     Medications- reviewed and updated Current Outpatient Prescriptions  Medication Sig Dispense Refill  . aspirin 81 MG tablet Take 81 mg by mouth every evening.     . Calcium Carbonate-Vitamin D (CALTRATE 600+D) 600-400 MG-UNIT per tablet Take 2 tablets by mouth every evening.     . Cholecalciferol (D 2000) 2000 UNITS TABS Take 1 tablet by mouth every evening.    Marland Kitchen co-enzyme Q-10 30 MG capsule Take 400 mg by mouth every evening.     . denosumab (PROLIA) 60 MG/ML SOLN injection Inject 60 mg into the skin every 6 (six) months.     . fish oil-omega-3 fatty acids 1000 MG capsule Take 1 g by mouth every evening.     . hydrochlorothiazide (HYDRODIURIL) 25 MG tablet Take 1 tablet (25 mg total) by mouth daily. 90 tablet 3  . ketorolac (ACULAR) 0.5 % ophthalmic solution 1 drop. Place  1 drop into the right eye 4 (four) times daily. Dispense 90 day supply w/ 3 refills    . Melatonin 3 MG TABS Take 3 mg by mouth at bedtime. Take by mouth nightly.    . metoprolol succinate (TOPROL-XL) 50 MG 24 hr tablet TAKE 1 TABLET BY MOUTH ONCE DAILY WITH FOOD OR FOLLOWING A MEAL 90 tablet 0  . Multiple Vitamin (MULTIVITAMIN) tablet Take 1 tablet by mouth daily.      . potassium chloride (K-DUR,KLOR-CON) 10 MEQ tablet Take 1 tablet (10 mEq total) by mouth daily. 60 tablet 3  . simvastatin (ZOCOR) 20 MG tablet take 1 tablet by mouth at bedtime 90 tablet 2  . vitamin C (ASCORBIC ACID) 500 MG tablet Take 500 mg by mouth every evening.     . nitroGLYCERIN (NITROSTAT) 0.4 MG SL tablet 0.4 mg. Place 0.4 mg under the tongue every 5 (five) minutes as needed. May take up to 3 doses.     No current facility-administered  medications for this visit.    Allergies-reviewed and updated Allergies  Allergen Reactions  . Lisinopril Cough  . Statins Other (See Comments)    Myalgia with lipitor- unproven She is able to tolerate simvastatin    History   Social History  . Marital Status: Married    Spouse Name: N/A  . Number of Children: 3  . Years of Education: N/A   Occupational History  . retired     works 1 day a week   Social History Main Topics  . Smoking status: Never Smoker   . Smokeless tobacco: Never Used  . Alcohol Use: 1.8 oz/week    3 Glasses of wine per week  . Drug Use: No  . Sexual Activity: Not on file   Other Topics Concern  . None   Social History Narrative   Married. 2 daughters. 2 grandkids.       Retired from Materials engineer ed mostly [place]: travel, time with friends, eating out, lots of grandkids time in [place]    ROS--See HPI   Objective: BP 130/90 mmHg  Pulse 76  Temp(Src) 98.5 F (36.9 C)  Ht 5\' 6"  (1.676 m)  Wt 168 lb (76.204 kg)  BMI 27.13 kg/m2 Gen: NAD, resting comfortably HEENT: Mucous membranes are moist. Oropharynx normal Neck: no thyromegaly CV: RRR no murmurs rubs or gallops Lungs: CTAB no crackles, wheeze, rhonchi Abdomen: soft/nontender/nondistended/normal bowel sounds. No rebound or guarding.  Ext: no edema GYN and breast not indicated Patient requests rectal and FOBT which was negative. Discussed not indicated but she strongly preferred to have this.  Skin: warm, dry, full skin exam deferred to derm visit at next available Pain with palpation at insertion of plantar fascia Neuro: grossly normal, moves all extremities, PERRLA   Assessment/Plan:  69 y.o. female presenting for annual physical.  Health Maintenance counseling: 1. Anticipatory guidance: Patient counseled regarding regular dental exams, wearing seatbelts, wearing sunscreen. Seeing dermatologist regularly at least yearly 2. Risk factor reduction:  Advised patient of  need for regular exercise (2 days a week currently- plan to boost) and diet rich and fruits and vegetables to reduce risk of heart attack and stroke. Goal weight 150.  3. Immunizations/screenings/ancillary studies - up todate    HTN (hypertension) Poor control on metoprolol 50mg  XL. Advised to take hctz everyday but she states the diuretic gets in way of her functions some days. Discussed potentially taking 1/2 pill per day but as cardiology started  this- she will discuss with them.   Hyperglycemia Fasting cbg elevated but not a1c. Still needs weight loss and weight goal 150 set.   Hyperlipidemia Controlled on simvastatin 20mg . Cramps controlled on coq10.  Lab Results  Component Value Date   CHOL 153 11/12/2014   HDL 36.50* 11/12/2014   LDLCALC 84 11/12/2014   LDLDIRECT 170.3 07/09/2013   TRIG 161.0* 11/12/2014   CHOLHDL 4 11/12/2014   Plantar fasciitis Home exercise program. Mobic 10 days. Advised heel lifts. Prn follow up  12 months for CPE. Offered 6 month BP check as well.   Results for orders placed or performed in visit on 02/17/15 (from the past 24 hour(s))  POC Hemoccult Bld/Stl (1-Cd Office Dx)     Status: None   Collection Time: 02/17/15  2:28 PM  Result Value Ref Range   Card #1 Date neg    Fecal Occult Blood, POC Negative   Hemoglobin A1c     Status: None   Collection Time: 02/17/15  2:36 PM  Result Value Ref Range   Hgb A1c MFr Bld 5.6 4.6 - 6.5 %  also note billing was entered as blood in stool for FOBT when actually done for colon cancer screening, there was no blood in stool.   Meds ordered this encounter  Medications  . meloxicam (MOBIC) 7.5 MG tablet    Sig: Take 1 tablet (7.5 mg total) by mouth daily. 10 days treatment to start    Dispense:  15 tablet    Refill:  0

## 2015-02-17 NOTE — Patient Instructions (Addendum)
Check a1c today At risk diabetes- need to work toward 150 weight goal Stool checked today- normal, no blood.  Meloxicam for 10 days for plantar fasciitis. Also do exercises Return to dermatology at next available  Skipping doses of blood pressure medicine is not advised. You need to take the hydrochlorothiazide everyday. Pressure high today not taking it. i would take it everyday. Could consider 1/2 a pill of Dr. Acie Fredrickson ok with it  6 months f/u

## 2015-02-18 ENCOUNTER — Encounter: Payer: Self-pay | Admitting: Family Medicine

## 2015-03-22 ENCOUNTER — Other Ambulatory Visit: Payer: Self-pay | Admitting: Dermatology

## 2015-04-01 ENCOUNTER — Encounter: Payer: Self-pay | Admitting: Gastroenterology

## 2015-04-13 ENCOUNTER — Encounter: Payer: Self-pay | Admitting: Cardiovascular Disease

## 2015-04-13 ENCOUNTER — Ambulatory Visit (INDEPENDENT_AMBULATORY_CARE_PROVIDER_SITE_OTHER): Payer: Medicare Other | Admitting: Cardiovascular Disease

## 2015-04-13 ENCOUNTER — Encounter: Payer: Self-pay | Admitting: Family Medicine

## 2015-04-13 VITALS — BP 124/72 | HR 66 | Ht 66.0 in | Wt 164.1 lb

## 2015-04-13 DIAGNOSIS — I10 Essential (primary) hypertension: Secondary | ICD-10-CM | POA: Diagnosis not present

## 2015-04-13 DIAGNOSIS — R0789 Other chest pain: Secondary | ICD-10-CM

## 2015-04-13 DIAGNOSIS — R0683 Snoring: Secondary | ICD-10-CM

## 2015-04-13 NOTE — Progress Notes (Signed)
Cardiology Office Note   Date:  04/13/2015   ID:  Angela [name], DOB 07-10-1946, MRN [id number]  PCP:  Garret Reddish, MD  Cardiologist:   Thayer Headings, MD   Chief Complaint  Patient presents with  . Follow-up    benign, essential hypertension   Problem List 1. Chest pain  2. Breast Cancer 3, Hypertension. 4. Hyperlipidemia 5. Macular cyst ( right eye)    History of Present Illness: Angela [name] is a 69 y.o. female who presents for  Who I have seen for evaluation for chest pain in 2013. She had a negative myoview at that time  She has continued to have some some tachycardia and intermittant chest pain.  Also has HTN. Since Christmas , has had some chest tightness in her left breast   CP do not last very long - perhaps ,  Last a few seconds. Felt like she has a knot in her breast  Tightness in her right neck  She exercises on occasion - twice a week - goes to the [facility].  These CP do not worsen with exertion.  Not affected with eating or drinking or change of position.  Has indigestion / GERD on occasion.    BP has been elevated most of her readings.   Aug. 3, 2016:    Is doing well. BP log shows normal BP readings at home.  Works out at [employer] No further episodes of CP  Still has indigestion at times. ( after eating spicy foods )   Past Medical History  Diagnosis Date  . Hyperlipidemia   . Osteopenia   . BCC (basal cell carcinoma)   . Breast cancer 2009  . Inflammation of bone current    foot, in a boot for 6 weeks  . Hypertension   . Arthritis     mild  . Arthritis pain   . Tachycardia   . Macular retinal cyst of right eye     Past Surgical History  Procedure Laterality Date  . Bcc equired excision    . Tonsillectomy    . Breast lumpectomy  10/09    breast cancer, left  . Mass excision  7/09    appendix  . [facility]  . Breast lumpectomy       Current Outpatient Prescriptions  Medication Sig Dispense  Refill  . aspirin 81 MG tablet Take 81 mg by mouth every evening.     . Calcium Carbonate-Vitamin D (CALTRATE 600+D) 600-400 MG-UNIT per tablet Take 2 tablets by mouth every evening.     . Cholecalciferol (D 2000) 2000 UNITS TABS Take 1 tablet by mouth every evening.    Marland Kitchen co-enzyme Q-10 30 MG capsule Take 400 mg by mouth every evening.     . denosumab (PROLIA) 60 MG/ML SOLN injection Inject 60 mg into the skin every 6 (six) months.     . fish oil-omega-3 fatty acids 1000 MG capsule Take 1 g by mouth every evening.     . hydrochlorothiazide (HYDRODIURIL) 25 MG tablet Take 1 tablet (25 mg total) by mouth daily. 90 tablet 3  . ketorolac (ACULAR) 0.5 % ophthalmic solution 1 drop. Place 1 drop into the right eye 4 (four) times daily. Dispense 90 day supply w/ 3 refills    . Melatonin 3 MG TABS Take 3 mg by mouth at bedtime. Take by mouth nightly.    . meloxicam (MOBIC) 7.5 MG tablet Take 1  tablet (7.5 mg total) by mouth daily. 10 days treatment to start 15 tablet 0  . metoprolol succinate (TOPROL-XL) 50 MG 24 hr tablet TAKE 1 TABLET BY MOUTH ONCE DAILY WITH FOOD OR FOLLOWING A MEAL 90 tablet 0  . Multiple Vitamin (MULTIVITAMIN) tablet Take 1 tablet by mouth daily.      . nitroGLYCERIN (NITROSTAT) 0.4 MG SL tablet 0.4 mg. Place 0.4 mg under the tongue every 5 (five) minutes as needed. May take up to 3 doses.    . potassium chloride (K-DUR,KLOR-CON) 10 MEQ tablet Take 1 tablet (10 mEq total) by mouth daily. 60 tablet 3  . simvastatin (ZOCOR) 20 MG tablet take 1 tablet by mouth at bedtime 90 tablet 2  . vitamin C (ASCORBIC ACID) 500 MG tablet Take 500 mg by mouth every evening.      No current facility-administered medications for this visit.    Allergies:   Lisinopril and Statins    Social History:  The patient  reports that she has never smoked. She has never used smokeless tobacco. She reports that she drinks about 1.8 oz of alcohol per week. She reports that she does not use illicit drugs.    Family History:  The patient's family history includes Heart attack in her father and mother; Heart disease in her father; Hypertension in her father and mother. There is no history of Colon cancer or Stroke.    ROS:  Please see the history of present illness.   Otherwise, review of systems are positive for none.   All other systems are reviewed and negative.    PHYSICAL EXAM: VS:  BP 124/72 mmHg  Pulse 66  Ht 5\' 6"  (1.676 m)  Wt 74.444 kg (164 lb 1.9 oz)  BMI 26.50 kg/m2  SpO2 93% , BMI Body mass index is 26.5 kg/(m^2). GEN: Well nourished, well developed, in no acute distress HEENT: normal Neck: no JVD, carotid bruits, or masses Cardiac: RRR; no murmurs, rubs, or gallops,no edema  Respiratory:  clear to auscultation bilaterally, normal work of breathing GI: soft, nontender, nondistended, + BS MS: no deformity or atrophy Skin: warm and dry, no rash Neuro:  Strength and sensation are intact Psych: normal   EKG:  EKG is ordered today. The ekg ordered today demonstrates NSR at 86.    Recent Labs: 11/12/2014: TSH 1.70 01/12/2015: ALT 26; BUN 19; Creatinine, Ser 0.71; Hemoglobin 13.6; Platelets 204; Potassium 3.9; Sodium 139    Lipid Panel    Component Value Date/Time   CHOL 153 11/12/2014 0910   TRIG 161.0* 11/12/2014 0910   HDL 36.50* 11/12/2014 0910   CHOLHDL 4 11/12/2014 0910   VLDL 32.2 11/12/2014 0910   LDLCALC 84 11/12/2014 0910   LDLDIRECT 170.3 07/09/2013 0813      Wt Readings from Last 3 Encounters:  04/13/15 74.444 kg (164 lb 1.9 oz)  02/17/15 76.204 kg (168 lb)  11/12/14 75.297 kg (166 lb)      Other studies Reviewed: Additional studies/ records that were reviewed today include: .previous myoview Review of the above records demonstrates:  No ischemia   ASSESSMENT AND PLAN:  1.  Chest discomfort:  Her CP sounds very atypical .  Not related to exertion.  She works out at [employer] and does well.    2. HTN:  BP is well controlled with the addition of  HCTZ and kdur.  Continue current meds We will have her medical doctor assume responsibility for refill in her blood pressure pills including the HCTZ  and potassium chloride. She'll need standard lab work every 6 months or so. I'll be happy to see her for any further management of her hypertension if needed      Current medicines are reviewed at length with the patient today.  The patient does not have concerns regarding medicines.  The following changes have been made:  no change  Labs/ tests ordered today include:  No orders of the defined types were placed in this encounter.     Disposition:   FU with me as needed.     Signed, Dalayna Lauter, Wonda Cheng, MD  04/13/2015 11:04 AM    [address], [place], [place]  [address] Phone: [phone]; Fax: [phone]

## 2015-04-13 NOTE — Patient Instructions (Signed)
Medication Instructions:  Your physician recommends that you continue on your current medications as directed. Please refer to the Current Medication list given to you today.   Labwork: None Ordered   Testing/Procedures: None Ordered   Follow-Up: Your physician recommends that you schedule a follow-up appointment in: as needed with Dr. Nahser     

## 2015-04-14 NOTE — Telephone Encounter (Signed)
referral for sleep medicine under snoring placed

## 2015-04-19 ENCOUNTER — Other Ambulatory Visit: Payer: Self-pay | Admitting: Family Medicine

## 2015-04-19 MED ORDER — METOPROLOL SUCCINATE ER 50 MG PO TB24: ORAL_TABLET | ORAL | Status: DC

## 2015-04-19 NOTE — Telephone Encounter (Signed)
Sent to the pharmacy for 6 months.  Pt gets 90 day supply.  Has a future follow up visit set up for 08/22/15.

## 2015-04-23 ENCOUNTER — Encounter: Payer: Self-pay | Admitting: Family Medicine

## 2015-05-02 ENCOUNTER — Encounter: Payer: Self-pay | Admitting: Family Medicine

## 2015-05-17 ENCOUNTER — Encounter: Payer: Self-pay | Admitting: Family Medicine

## 2015-05-17 ENCOUNTER — Telehealth: Payer: Self-pay | Admitting: Family Medicine

## 2015-05-17 ENCOUNTER — Other Ambulatory Visit: Payer: Self-pay | Admitting: Family Medicine

## 2015-05-17 DIAGNOSIS — Z853 Personal history of malignant neoplasm of breast: Secondary | ICD-10-CM

## 2015-05-17 NOTE — Telephone Encounter (Signed)
Pt states [facility] has no record of her referral for sleep study being sent over 8/5. Can you please advise?

## 2015-05-17 NOTE — Telephone Encounter (Signed)
Will call there office directly to schedule pt

## 2015-05-17 NOTE — Telephone Encounter (Signed)
On 04/13/15 I entered-  Ambulatory referral to Sleep Studies [REF99 [facility]]    What order do they need other than this? I want her to see sleep medicine folks.

## 2015-06-20 ENCOUNTER — Ambulatory Visit
Admission: RE | Admit: 2015-06-20 | Discharge: 2015-06-20 | Disposition: A | Discharge status: Home / Self Care | Payer: Medicare Other | Source: Ambulatory Visit | Attending: Family Medicine | Admitting: Family Medicine

## 2015-06-20 DIAGNOSIS — Z853 Personal history of malignant neoplasm of breast: Secondary | ICD-10-CM

## 2015-07-01 ENCOUNTER — Ambulatory Visit (HOSPITAL_COMMUNITY)
Admission: RE | Admit: 2015-07-01 | Discharge: 2015-07-01 | Disposition: A | Discharge status: Home / Self Care | Payer: Medicare Other | Source: Ambulatory Visit | Attending: Hematology & Oncology | Admitting: Hematology & Oncology

## 2015-07-01 ENCOUNTER — Other Ambulatory Visit (HOSPITAL_COMMUNITY): Payer: Medicare Other

## 2015-07-01 ENCOUNTER — Other Ambulatory Visit (HOSPITAL_COMMUNITY): Payer: Self-pay | Admitting: Hematology & Oncology

## 2015-07-01 DIAGNOSIS — M899 Disorder of bone, unspecified: Secondary | ICD-10-CM

## 2015-07-01 DIAGNOSIS — Z780 Asymptomatic menopausal state: Secondary | ICD-10-CM | POA: Diagnosis not present

## 2015-07-01 DIAGNOSIS — M949 Disorder of cartilage, unspecified: Secondary | ICD-10-CM | POA: Diagnosis not present

## 2015-07-08 ENCOUNTER — Ambulatory Visit (HOSPITAL_COMMUNITY): Payer: Medicare Other | Admitting: Hematology & Oncology

## 2015-07-08 ENCOUNTER — Encounter (HOSPITAL_BASED_OUTPATIENT_CLINIC_OR_DEPARTMENT_OTHER): Payer: Medicare Other | Admitting: Hematology & Oncology

## 2015-07-08 ENCOUNTER — Encounter (HOSPITAL_COMMUNITY): Payer: Medicare Other | Attending: Hematology & Oncology

## 2015-07-08 ENCOUNTER — Encounter (HOSPITAL_COMMUNITY): Payer: Self-pay | Admitting: Hematology & Oncology

## 2015-07-08 VITALS — BP 139/96 | HR 65 | Temp 99.0°F | Resp 16 | Wt 166.1 lb

## 2015-07-08 DIAGNOSIS — M8580 Other specified disorders of bone density and structure, unspecified site: Secondary | ICD-10-CM | POA: Insufficient documentation

## 2015-07-08 DIAGNOSIS — Z853 Personal history of malignant neoplasm of breast: Secondary | ICD-10-CM | POA: Diagnosis not present

## 2015-07-08 DIAGNOSIS — C50919 Malignant neoplasm of unspecified site of unspecified female breast: Secondary | ICD-10-CM | POA: Insufficient documentation

## 2015-07-08 DIAGNOSIS — M949 Disorder of cartilage, unspecified: Secondary | ICD-10-CM

## 2015-07-08 DIAGNOSIS — M899 Disorder of bone, unspecified: Secondary | ICD-10-CM

## 2015-07-08 LAB — COMPREHENSIVE METABOLIC PANEL
ALBUMIN: 4.1 g/dL (ref 3.5–5.0)
ALK PHOS: 69 U/L (ref 38–126)
ALT: 27 U/L (ref 14–54)
AST: 24 U/L (ref 15–41)
Anion gap: 6 (ref 5–15)
BILIRUBIN TOTAL: 0.6 mg/dL (ref 0.3–1.2)
BUN: 17 mg/dL (ref 6–20)
CALCIUM: 9.6 mg/dL (ref 8.9–10.3)
CO2: 31 mmol/L (ref 22–32)
Chloride: 101 mmol/L (ref 101–111)
Creatinine, Ser: 0.72 mg/dL (ref 0.44–1.00)
GFR calc Af Amer: >60 mL/min (ref >60)
GFR calc non Af Amer: >60 mL/min (ref >60)
GLUCOSE: 99 mg/dL (ref 65–99)
Potassium: 4.2 mmol/L (ref 3.5–5.1)
Sodium: 138 mmol/L (ref 135–145)
TOTAL PROTEIN: 6.9 g/dL (ref 6.5–8.1)

## 2015-07-08 MED ORDER — DENOSUMAB 60 MG/ML ~~LOC~~ SOLN
60.0000 mg | Freq: Once | SUBCUTANEOUS | Status: AC
  Administered 2015-07-08: 60 mg via SUBCUTANEOUS
  Filled 2015-07-08: qty 1

## 2015-07-08 NOTE — Patient Instructions (Addendum)
[place] at [facility] Discharge Instructions  RECOMMENDATIONS MADE BY THE CONSULTANT AND ANY TEST RESULTS WILL BE SENT TO YOUR REFERRING PHYSICIAN.  Exam and discussion by Dr. Whitney Muse. Your labs are stable and will give you prolia injection today. Report any jaw discomfort, new lumps, bone pain, shortness of breath or other symptoms. Results for SEE, BEHARRY (MRN [id number]) as of 07/08/2015 11:27  Ref. Range 07/08/2015 10:16  Sodium Latest Ref Range: 135-145 mmol/L 138  Potassium Latest Ref Range: 3.5-5.1 mmol/L 4.2  Chloride Latest Ref Range: 101-111 mmol/L 101  CO2 Latest Ref Range: 22-32 mmol/L 31  BUN Latest Ref Range: 6-20 mg/dL 17  Creatinine Latest Ref Range: 0.44-1.00 mg/dL 0.72  Calcium Latest Ref Range: 8.9-10.3 mg/dL 9.6  EGFR (Non-African Amer.) Latest Ref Range: >60 mL/min >60  EGFR (African American) Latest Ref Range: >60 mL/min >60  Glucose Latest Ref Range: 65-99 mg/dL 99  Anion gap Latest Ref Range: 5-15  6  Alkaline Phosphatase Latest Ref Range: 38-126 U/L 69  Albumin Latest Ref Range: 3.5-5.0 g/dL 4.1  AST Latest Ref Range: 15-41 U/L 24  ALT Latest Ref Range: 14-54 U/L 27  Total Protein Latest Ref Range: 6.5-8.1 g/dL 6.9  Total Bilirubin Latest Ref Range: 0.3-1.2 mg/dL 0.6    Labs and prolia every 6 months Office visit in 1 year.  Thank you for choosing [place] at [facility] to provide your oncology and hematology care.  To afford each patient quality time with our provider, please arrive at least 15 minutes before your scheduled appointment time.    You need to re-schedule your appointment should you arrive 10 or more minutes late.  We strive to give you quality time with our providers, and arriving late affects you and other patients whose appointments are after yours.  Also, if you no show three or more times for appointments you may be dismissed from the clinic at the providers discretion.     Again,  thank you for choosing [facility].  Our hope is that these requests will decrease the amount of time that you wait before being seen by our physicians.       _____________________________________________________________  Should you have questions after your visit to [facility], please contact our office at [phone] between the hours of 8:30 a.m. and 4:30 p.m.  Voicemails left after 4:30 p.m. will not be returned until the following business day.  For prescription refill requests, have your pharmacy contact our office.

## 2015-07-08 NOTE — Progress Notes (Signed)
Angela [name] NOTE  Patient Care Team: Marin Olp, MD as PCP - General (Family Medicine) Autumn Messing III, MD as Consulting Physician (General Surgery) Thea Silversmith, MD as Consulting Physician (Radiation Oncology) Inda Castle, MD as Consulting Physician (Gastroenterology)  CHIEF COMPLAINTS/PURPOSE OF CONSULTATION:  Stage I lobular carcinoma of the L breast Lumpectomy, sentinel node, XRT Anastrozole completed 08/2013 Oncotype DX score of 8 History of osteopenia DEXA 06/12/15 with osteopenia Mammogram on 06/20/2015  HISTORY OF PRESENTING ILLNESS:   Angela [name] 69 y.o. female is here because of follow up on her breast cancer in 2009. She is here today with her husband.   She has taken Arimidex and is done with that.  We reviewed her labs in detail per her request.  She remarks that she hit the back of her head; says that it hurt a lot initially, went away a bit, then came back. She states that sometimes she will feel an occasional aching shooting pain down her head. This injury occurred on the 8th of October when she hit her head on the trunk of her car. She denies blurry vision or headaches.  She's had a lot of "skin cancers" removed recently.   She denies any issues with her breast and confirms that she does self-breast exams.  Autumn Messing at [facility] did her surgery.  She still takes her calcium and vitamin D.   MEDICAL HISTORY:  Past Medical History  Diagnosis Date  . Hyperlipidemia   . Osteopenia   . BCC (basal cell carcinoma)   . Breast cancer ([place]) 2009  . Inflammation of bone ([place]) current    foot, in a boot for 6 weeks  . Hypertension   . Arthritis     mild  . Arthritis pain   . Tachycardia   . Macular retinal cyst of right eye     SURGICAL HISTORY: Past Surgical History  Procedure Laterality Date  . Bcc equired excision    . Tonsillectomy    . Breast lumpectomy  10/09    breast cancer,  left  . Mass excision  7/09    appendix  . [facility]aract [facility]  [facility]  . Breast lumpectomy      SOCIAL HISTORY: Social History   Social History  . Marital Status: Married    Spouse Name: N/A  . Number of Children: 3  . Years of Education: N/A   Occupational History  . retired     works 1 day a week   Social History Main Topics  . Smoking status: Never Smoker   . Smokeless tobacco: Never Used  . Alcohol Use: 1.8 oz/week    3 Glasses of wine per week  . Drug Use: No  . Sexual Activity: Not on file   Other Topics Concern  . Not on file   Social History Narrative   Married. 2 daughters. 2 grandkids.       Retired from Materials engineer ed mostly [place]: travel, time with friends, eating out, lots of grandkids time in [place]   Married for 48 years to her husband; have two grown daughters. One in [place], one in [place]. They have two grandchildren and enjoy travelling. They like going overseas and try to take one big trip a year.  FAMILY HISTORY: Family History  Problem Relation Age of Onset  . Heart attack Mother   .  Hypertension Mother   . Heart disease Father   . Heart attack Father   . Hypertension Father   . Colon cancer Neg Hx   . Stroke Neg Hx    indicated that her mother is deceased. She indicated that her father is deceased. She indicated that her sister is deceased.   ALLERGIES:  is allergic to lisinopril and statins.  MEDICATIONS:  Current Outpatient Prescriptions  Medication Sig Dispense Refill  . aspirin 81 MG tablet Take 81 mg by mouth every evening.     . Calcium Carbonate-Vitamin D (CALTRATE 600+D) 600-400 MG-UNIT per tablet Take 2 tablets by mouth every evening.     . Cholecalciferol (D 2000) 2000 UNITS TABS Take 1 tablet by mouth every evening.    Marland Kitchen co-enzyme Q-10 30 MG capsule Take 400 mg by mouth every evening.     . denosumab (PROLIA) 60 MG/ML SOLN injection Inject 60 mg into the skin every 6 (six) months.     . fish  oil-omega-3 fatty acids 1000 MG capsule Take 1 g by mouth every evening.     . hydrochlorothiazide (HYDRODIURIL) 25 MG tablet Take 1 tablet (25 mg total) by mouth daily. 90 tablet 3  . ketorolac (ACULAR) 0.5 % ophthalmic solution 1 drop. Place 1 drop into the right eye 4 (four) times daily. Dispense 90 day supply w/ 3 refills    . Melatonin 3 MG TABS Take 3 mg by mouth at bedtime. Take by mouth nightly.    . metoprolol succinate (TOPROL-XL) 50 MG 24 hr tablet TAKE 1 TABLET BY MOUTH ONCE DAILY WITH FOOD OR FOLLOWING A MEAL 90 tablet 1  . Multiple Vitamin (MULTIVITAMIN) tablet Take 1 tablet by mouth daily.      . nitroGLYCERIN (NITROSTAT) 0.4 MG SL tablet 0.4 mg. Place 0.4 mg under the tongue every 5 (five) minutes as needed. May take up to 3 doses.    . potassium chloride (K-DUR,KLOR-CON) 10 MEQ tablet Take 1 tablet (10 mEq total) by mouth daily. 60 tablet 3  . simvastatin (ZOCOR) 20 MG tablet take 1 tablet by mouth at bedtime 90 tablet 2  . vitamin C (ASCORBIC ACID) 500 MG tablet Take 500 mg by mouth every evening.     . meloxicam (MOBIC) 7.5 MG tablet Take 1 tablet (7.5 mg total) by mouth daily. 10 days treatment to start (Patient not taking: Reported on 07/08/2015) 15 tablet 0   No current facility-administered medications for this visit.    Review of Systems  Constitutional: Negative.   HENT: Negative.   Eyes: Negative.   Respiratory: Negative.   Cardiovascular: Negative.   Gastrointestinal: Negative.   Genitourinary: Negative.   Musculoskeletal: Negative.   Skin: Negative.   Neurological: Negative.   Endo/Heme/Allergies: Negative.   Psychiatric/Behavioral: Negative.   All other systems reviewed and are negative.  14 point ROS was done and is otherwise as detailed above or in HPI  PHYSICAL EXAMINATION: ECOG PERFORMANCE STATUS: 0 - Asymptomatic  Filed Vitals:   07/08/15 1037  BP: 139/96  Pulse: 65  Temp: 99 F (37.2 C)  Resp: 16   Filed Weights   07/08/15 1037  Weight:  166 lb 1.6 oz (75.342 kg)     Physical Exam  Constitutional: She is oriented to person, place, and time and well-developed, well-nourished, and in no distress.  HENT:  Head: Normocephalic and atraumatic.  Nose: Nose normal.  Mouth/Throat: Oropharynx is clear and moist. No oropharyngeal exudate.  Eyes: Conjunctivae and EOM are normal. Pupils  are equal, round, and reactive to light. Right eye exhibits no discharge. Left eye exhibits no discharge. No scleral icterus.  Neck: Normal range of motion. Neck supple. No tracheal deviation present. No thyromegaly present.  Cardiovascular: Normal rate, regular rhythm and normal heart sounds.  Exam reveals no gallop and no friction rub.   No murmur heard. Pulmonary/Chest: Effort normal and breath sounds normal. She has no wheezes. She has no rales.  Abdominal: Soft. Bowel sounds are normal. She exhibits no distension and no mass. There is no tenderness. There is no rebound and no guarding.  Musculoskeletal: Normal range of motion. She exhibits no edema.  Lymphadenopathy:    She has no cervical adenopathy.  Neurological: She is alert and oriented to person, place, and time. She has normal reflexes. No cranial nerve deficit. Gait normal. Coordination normal.  Skin: Skin is warm and dry. No rash noted.  Psychiatric: Mood, memory, affect and judgment normal.  Nursing note and vitals reviewed. Breast exam: No palpable masses in either breast.  No abnormal skin findings. Both axillae without palpable abnormalities. L breast incision site without suspicious changes.   LABORATORY DATA:  I have reviewed the data as listed Lab Results  Component Value Date   WBC 6.3 01/12/2015   HGB 13.6 01/12/2015   HCT 40.7 01/12/2015   MCV 94.9 01/12/2015   PLT 204 01/12/2015   CMP     Component Value Date/Time   NA 138 07/08/2015 1016   K 4.2 07/08/2015 1016   CL 101 07/08/2015 1016   CO2 31 07/08/2015 1016   GLUCOSE 99 07/08/2015 1016   BUN 17 07/08/2015  1016   CREATININE 0.72 07/08/2015 1016   CALCIUM 9.6 07/08/2015 1016   PROT 6.9 07/08/2015 1016   ALBUMIN 4.1 07/08/2015 1016   AST 24 07/08/2015 1016   ALT 27 07/08/2015 1016   ALKPHOS 69 07/08/2015 1016   BILITOT 0.6 07/08/2015 1016   GFRNONAA >60 07/08/2015 1016   GFRAA >60 07/08/2015 1016     RADIOGRAPHIC STUDIES: I have personally reviewed the radiological images as listed and agreed with the findings in the report. No results found.    Study Result     EXAM: DUAL X-RAY ABSORPTIOMETRY (DXA) FOR BONE MINERAL DENSITY  IMPRESSION: Ordering Physician: Dr. Patrici Ranks,  Your patient Angela [name] completed a BMD test on 07/01/2015 using the Manning (software version: 14.10) manufactured by [employer]. The following summarizes the results of our evaluation.  PATIENT BIOGRAPHICAL: Name: Angela [name], Angela [name] Patient ID: [id number] Birth Date: 1945/09/20 Height: 66.0 in. Gender: Female Exam Date: 07/01/2015 Weight: 168.0 lbs.  Indications: Hx Breast Ca, Follow up Osteopenia, Caucasian, Post Menopausal Fractures: Treatments: Calcium, Multivitamin, Prolia, Vitamin D  DENSITOMETRY RESULTS: Site Region Measured Date Measured Age WHO Classification Young Adult T-score BMD %Change vs. Previous Significant Change (*)  AP Spine L1-L3 07/01/[age] 68.5 Oste[age] -1.2 1.026 g/cm2 7.5% Yes AP Spine L1-L3 06/29/2013 66.5 Osteopenia -1.8 0.954 g/cm2 - -  DualFemur Neck Right 07/01/2015 68.5 Osteopenia -1.5 0.827 g/cm2 -0.7% - DualFemur Neck Right 06/29/2013 66.5 Osteopenia -1.5 0.833 g/cm2 - - ASSESSMENT: BMD as determined from Femur Neck Right is 0.827 g/cm2 with a T-Score of -1.5. This patient is considered osteopenic according to [facility]) criteria.  Compared with the prior study on 06/29/2013, the BMD of the lumbar spine shows a statistically significant increase. Compared with the prior study  on 06/29/2013, the BMD of the right femoral neck shows  no statistically significant change. (L-4 was excluded due to advanced degenerative changes.) (Patient does not meet criteria for FRAX assessment.)  World Health Organization [facility]) criteria for post-menopausal, Caucasian Women: Normal: T-score at or above -1 SD Osteopenia: T-score between -1 and -2.5 SD Osteoporosis: T-score at or below -2.5 SD  RECOMMENDATIONS: [place] recommends that FDA-approved medial therapies be considered in postmenopausal women and men age 63 or older with a: 1. Hip or vertebral (clinical or morphometric) fracture. 2. T-Score of < -2.5 at the spine or hip. 3. Ten-year fracture probability by FRAX of 3% or greater for hip fracture or 20% or greater for major osteoporotic fracture.  All treatment decisions require clinical judgment and consideration of individual patient factors, including patient preferences, co-morbidities, previous drug use, risk factors not captured in the FRAX model (e.g. falls, vitamin D deficiency, increased bone turnover, interval significant decline in bone density) and possible under-or over-estimation of fracture risk by FRAX.  All patients should ensure an adequate intake of dietary calcium (1200 mg/d) and vitamin D (800 IU daily) unless contraindicated.  FOLLOW-UP: People with diagnosed cases of osteoporosis or osteopenia should be regularly tested for bone mineral density. For patients eligible for Medicare, routine testing is allowed once every 2 years. Testing frequency can be increased for patients who have rapidly progressing disease, or for those who are receiving medical therapy to restore bone mass.  I have reviewed this report, and agree with the above findings. [facility]   Electronically Signed  By: Earle Gell M.D.  On: 07/01/2015 11:31   Study Result     CLINICAL DATA: 69 year old female  presenting for routine postlumpectomy follow-up in the left breast. Patient had left breast lumpectomy in 2009.  EXAM: DIGITAL DIAGNOSTIC BILATERAL MAMMOGRAM WITH 3D TOMOSYNTHESIS AND CAD  COMPARISON: Previous exam(s).  ACR Breast Density Category b: There are scattered areas of fibroglandular density.  FINDINGS: The lumpectomy site in the upper-outer quadrant of the left breast appear stable. No suspicious calcifications, masses or areas of distortion are seen in the bilateral breasts.  Mammographic images were processed with CAD.  IMPRESSION: Stable left breast lumpectomy site. No evidence of malignancy in the bilateral breasts.  RECOMMENDATION: Screening mammogram in one year.(Code:SM-B-01Y)  I have discussed the findings and recommendations with the patient. Results were also provided in writing at the conclusion of the visit. If applicable, a reminder letter will be sent to the patient regarding the next appointment.  BI-RADS CATEGORY 2: Benign.   Electronically Signed  By: Ammie Ferrier M.D.  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 She was instructed to continue on calcium and vitamin D daily. Weight baring exercise such as walking was encouraged.  She is interested in pursuing additional mammography at [facility]. I advised her I can help arrange this.  Breast exam today is unremarkable.  There is no evidence of recurrence.  She will be getting her shot today.  She will return on in year with physical exam.  All questions were answered. The patient knows to call the clinic with any problems, questions or concerns.  This document serves as a record of services  personally performed by Ancil Linsey, MD. It was created on her behalf by Toni Amend, a  trained medical scribe. The creation of this record is based on the scribe's personal observations and the provider's statements to them. This document has been checked and approved by the attending provider.  This note was electronically signed.    Molli Hazard, MD  07/08/2015 11:09 AM

## 2015-07-08 NOTE — Progress Notes (Signed)
Angela [name] presents today for injection per MD orders. Prolia 60 mg  administered SQ in left Abdomen. Administration without incident. Patient tolerated well.

## 2015-07-08 NOTE — Progress Notes (Signed)
Labs drawn

## 2015-07-23 ENCOUNTER — Encounter: Payer: Self-pay | Admitting: Family Medicine

## 2015-07-25 ENCOUNTER — Ambulatory Visit (INDEPENDENT_AMBULATORY_CARE_PROVIDER_SITE_OTHER): Payer: Medicare Other | Admitting: Pulmonary Disease

## 2015-07-25 ENCOUNTER — Other Ambulatory Visit: Payer: Self-pay

## 2015-07-25 ENCOUNTER — Encounter: Payer: Self-pay | Admitting: Pulmonary Disease

## 2015-07-25 VITALS — BP 132/84 | HR 82 | Ht 66.0 in | Wt 167.0 lb

## 2015-07-25 DIAGNOSIS — I10 Essential (primary) hypertension: Secondary | ICD-10-CM

## 2015-07-25 DIAGNOSIS — G4733 Obstructive sleep apnea (adult) (pediatric): Secondary | ICD-10-CM

## 2015-07-25 DIAGNOSIS — R079 Chest pain, unspecified: Secondary | ICD-10-CM

## 2015-07-25 MED ORDER — HYDROCHLOROTHIAZIDE 25 MG PO TABS: 25.0000 mg | ORAL_TABLET | Freq: Every day | ORAL | Status: DC

## 2015-07-25 MED ORDER — POTASSIUM CHLORIDE CRYS ER 10 MEQ PO TBCR: 10.0000 meq | EXTENDED_RELEASE_TABLET | Freq: Every day | ORAL | Status: DC

## 2015-07-25 NOTE — Patient Instructions (Signed)
Will arrange for home sleep study Will call to arrange for follow up after sleep study reviewed  

## 2015-07-25 NOTE — Progress Notes (Deleted)
   Subjective:    Patient ID: Angela [name], female    DOB: 1946-01-04, 69 y.o.   MRN: [id number]  HPI    Review of Systems  Constitutional: Negative for fever and unexpected weight change.  HENT: Positive for congestion, postnasal drip, rhinorrhea, sneezing, sore throat ( x1 day) and voice change ( hoarse). Negative for dental problem, ear pain, nosebleeds, sinus pressure and trouble swallowing.   Eyes: Negative for redness and itching.  Respiratory: Positive for cough. Negative for chest tightness, shortness of breath and wheezing.   Cardiovascular: Negative for palpitations and leg swelling.  Gastrointestinal: Negative for nausea and vomiting.  Genitourinary: Negative for dysuria.  Musculoskeletal: Negative for joint swelling.  Skin: Negative for rash.  Neurological: Negative for headaches.  Hematological: Does not bruise/bleed easily.  Psychiatric/Behavioral: Negative for dysphoric mood. The patient is not nervous/anxious.        Objective:   Physical Exam        Assessment & Plan:

## 2015-07-25 NOTE — Progress Notes (Signed)
Chief Complaint  Patient presents with  . Sleep Consult    Referred by Dr Yong Channel for snoring. Epworth Score: 16    History of Present Illness: Angela [name] is a 69 y.o. female for evaluation of sleep problems.  She has noticed trouble with snoring for a while.  Her husband has to sleep in separate room.  She has been feeling more sleepy during the day.  She gets a dry mouth at night.  She will get shallow breathing at night.  She gets cramps in her feet at night.  She goes to sleep at 11 pm.  She falls asleep after 30 minutes.  She wakes up 3 times to use the bathroom.  She gets out of bed at 8 am.  She feels tired in the morning.  She occasional gets morning headache.  She uses melatonin a few times a week to help her sleep.  She drinks 2 cups of coffee in the morning, and a diet soda in the afternoon >> she feels like she needs the caffeine.  She can fall asleep while reading or watching TV.  She also falls asleep when on long car rides as a passenger.  She travels a fare amount  She denies sleep walking, sleep talking, bruxism, or nightmares.  There is no history of restless legs.  She denies sleep hallucinations, sleep paralysis, or cataplexy.  The Epworth score is 16 out of 24.  Angela [name]  has a past medical history of Hyperlipidemia; Osteopenia; BCC (basal cell carcinoma); Breast cancer ([place]) (2009); Inflammation of bone ([facility]) (current); Hypertension; Arthritis; Arthritis pain; Tachycardia; and Macular retinal cyst of right eye.  Angela [name]  has past surgical history that includes bcc equired excision; Tonsillectomy; Breast lumpectomy (10/09); Mass excision (7/09); Cataract extraction (2012); and Breast lumpectomy.   Medication Sig  aspirin 81 MG tablet Take 81 mg by mouth every evening.   Calcium Carbonate-Vitamin D (CALTRATE 600+D) 600-400 MG-UNIT per tablet Take 2 tablets by mouth every evening.   Cholecalciferol (D 2000) 2000 UNITS TABS Take 1 tablet by mouth every evening.   co-enzyme Q-10 30 MG capsule Take 400 mg by mouth every evening.   denosumab (PROLIA) 60 MG/ML SOLN injection Inject 60 mg into the skin every 6 (six) months.   fish oil-omega-3 fatty acids 1000 MG capsule Take 1 g by mouth every evening.   hydrochlorothiazide (HYDRODIURIL) 25 MG tablet Take 1 tablet (25 mg total) by mouth daily.  ketorolac (ACULAR) 0.5 % ophthalmic solution 1 drop. Place 1 drop into the right eye 4 (four) times daily. Dispense 90 day supply w/ 3 refills  Melatonin 3 MG TABS Take 3 mg by mouth at bedtime. Take by mouth nightly.  metoprolol succinate (TOPROL-XL) 50 MG 24 hr tablet TAKE 1 TABLET BY MOUTH ONCE DAILY WITH FOOD OR FOLLOWING A MEAL  Multiple Vitamin (MULTIVITAMIN) tablet Take 1 tablet by mouth daily.    potassium chloride (K-DUR,KLOR-CON) 10 MEQ tablet Take 1 tablet (10 mEq total) by mouth daily.  simvastatin (ZOCOR) 20 MG tablet take 1 tablet by mouth at bedtime  vitamin C (ASCORBIC ACID) 500 MG tablet Take 500 mg by mouth every evening.     Allergies  Allergen Reactions  . Lisinopril Cough  . Statins Other (See Comments)    Myalgia with lipitor- unproven She is able to tolerate simvastatin    Her family history includes Heart attack in her father and mother; Heart disease in her father; Hypertension in her father  and mother. There is no history of Colon cancer or Stroke.  She  reports that she has never smoked. She has never used smokeless tobacco. She reports that she drinks about 1.8 oz of alcohol per week. She reports that she does not use illicit drugs.  Review of Systems  Constitutional: Negative for fever and unexpected weight change.  HENT: Positive for congestion, postnasal drip, rhinorrhea, sneezing, sore throat ( x1 day) and voice change ( hoarse). Negative for dental problem, ear pain, nosebleeds, sinus pressure and trouble swallowing.   Eyes: Negative for redness and itching.  Respiratory: Positive for cough. Negative for chest tightness,  shortness of breath and wheezing.   Cardiovascular: Negative for palpitations and leg swelling.  Gastrointestinal: Negative for nausea and vomiting.  Genitourinary: Negative for dysuria.  Musculoskeletal: Negative for joint swelling.  Skin: Negative for rash.  Neurological: Negative for headaches.  Hematological: Does not bruise/bleed easily.  Psychiatric/Behavioral: Negative for dysphoric mood. The patient is not nervous/anxious.    Physical Exam: BP 132/84 mmHg  Pulse 82  Ht 5\' 6"  (1.676 m)  Wt 167 lb (75.751 kg)  BMI 26.97 kg/m2  SpO2 98%  General - No distress ENT - No sinus tenderness, no oral exudate, no LAN, no thyromegaly, TM clear, pupils equal/reactive, MP 3, elongated uvula Cardiac - s1s2 regular, no murmur, pulses symmetric Chest - No wheeze/rales/dullness, good air entry, normal respiratory excursion Back - No focal tenderness Abd - Soft, non-tender, no organomegaly, + bowel sounds Ext - No edema Neuro - Normal strength, cranial nerves intact Skin - No rashes Psych - Normal mood, and behavior  Discussion: She has snoring, sleep disruption, and daytime sleepiness.  She has history of HTN.  I am concerned she could have sleep apnea.  We discussed how sleep apnea can affect various health problems including risks for hypertension, cardiovascular disease, and diabetes.  We also discussed how sleep disruption can increase risks for accident, such as while driving.  Weight loss as a means of improving sleep apnea was also reviewed.  Additional treatment options discussed were CPAP therapy, oral appliance, and surgical intervention.  Assessment/plan:  Obstructive sleep apnea. Plan: - will arrange for home sleep study pending insurance approval - she might be a good candidate for oral appliance   Chesley Mires, M.D. Pager [phone]

## 2015-08-15 DIAGNOSIS — G4733 Obstructive sleep apnea (adult) (pediatric): Secondary | ICD-10-CM | POA: Diagnosis not present

## 2015-08-16 ENCOUNTER — Encounter: Payer: Self-pay | Admitting: Podiatry

## 2015-08-16 ENCOUNTER — Ambulatory Visit (INDEPENDENT_AMBULATORY_CARE_PROVIDER_SITE_OTHER): Payer: Medicare Other | Admitting: Podiatry

## 2015-08-16 ENCOUNTER — Ambulatory Visit (INDEPENDENT_AMBULATORY_CARE_PROVIDER_SITE_OTHER): Payer: Medicare Other

## 2015-08-16 DIAGNOSIS — R52 Pain, unspecified: Secondary | ICD-10-CM

## 2015-08-16 DIAGNOSIS — M722 Plantar fascial fibromatosis: Secondary | ICD-10-CM | POA: Diagnosis not present

## 2015-08-16 MED ORDER — TRIAMCINOLONE ACETONIDE 10 MG/ML IJ SUSP
10.0000 mg | Freq: Once | INTRAMUSCULAR | Status: AC
  Administered 2015-08-16: 10 mg

## 2015-08-16 NOTE — Progress Notes (Signed)
   Subjective:    Patient ID: Angela [name], female    DOB: [birth year], 69 y.o.   MRN: [id number]  HPI    This patient presents today with approximate seven-month history of the left inferior heel pain aggravated with standing walking relieved with rest. The symptoms have gradually worsened over time. Patient has reduced the amount of standing and walking and substitute the exercise bike at the gym. She wears athletic style shoes . Patient has existing rigid orthotics from previous foot pain episode from 2012 admits she only wears them on occasion, however, she says they're comfortable. Also she's taken 200 mg ibuprofen tablets twice a day intermittently over the last several months  Review of Systems  Musculoskeletal: Positive for gait problem.       Objective:   Physical Exam   orientated 3  Vascular: No peripheral edema bilaterally DP and PT pulses 2/4 bilaterally Capillary reflex immediate bilaterally  Neurological: Sensation to 10 g monofilament wire intact 4/5 right 5/5 left Vibratory sensation nonreactive bilaterally Ankle reflex equal and reactive bilaterally  Dermatological: No open skin lesions bilaterally Texture and turgor adequate bilaterally  Musculoskeletal: Palpable tenderness central inferior left heel without any palpable lesions Palpable tenderness mid section medial fascial band without a palpable lesions There is no palpable tenderness when the calcaneus is compressed mediolaterally  X-ray examination weightbearing left foot dated 08/16/2015 demonstrates small inferior posterior calcaneal spurs There is no acute bony abnormality noted left foot       Assessment & Plan:    assessment: Plantar fasciitis left  Plan: Today review the results of the x-ray examination with patient today. We discussed plantar fasciitis in detail. Patient was offered Kenalog injection she verbally consents  Skin is prepped with alcohol and Betadine and 10 mg of Kenalog  mixed with 10 mg of plain Xylocaine and 2.5 mg of plain Marcaine were injected inferior heel left for Kenalog injection #1. Patient tolerated procedure without any difficulty Plantar fasciitis strap dispensed Patient will existing custom foot orthotics plus plantar fasciitis strap on the left foot  Reappoint 6 week

## 2015-08-16 NOTE — Patient Instructions (Signed)
Wear the fasciitis strap plus the custom foot orthotic in an athletic lace up style shoe on ongoing daily basis Maintain stretching as described below  Reevaluate 6 weeks  Bent - Knee Calf Stretch  1) Stand an arm's length away from a wall. Place the palms of your hands on the wall. Step forward about 12 inches with the opposite foot.  2) Keeping toes pointed forward and both heels on the floor, bend both knees and lean forward. Hold this position for 60 seconds. Don't arch your back and don't hunch your shoulders.  3) Repeat this twice.  DO THIS STRETCHING TECHNIQUE 3 TIMES A DAY.   Stretching Exercises before Standing  Pull your toes up toward your nose and hold for 1 minute before standing.  A towel can assist with this exercise if you put the towel under the ball of your foot. This exercise reduces the intense   pain associated when changing from a seated to a standing position. This stretch can usually be the most beneficial if done before getting out of bed in the mornings. Plantar Fasciitis Plantar fasciitis is a painful foot condition that affects the heel. It occurs when the band of tissue that connects the toes to the heel bone (plantar fascia) becomes irritated. This can happen after exercising too much or doing other repetitive activities (overuse injury). The pain from plantar fasciitis can range from mild irritation to severe pain that makes it difficult for you to walk or move. The pain is usually worse in the morning or after you have been sitting or lying down for a while. CAUSES This condition may be caused by:  Standing for long periods of time.  Wearing shoes that do not fit.  Doing high-impact activities, including running, aerobics, and ballet.  Being overweight.  Having an abnormal way of walking (gait).  Having tight calf muscles.  Having high arches in your feet.  Starting a new athletic activity. SYMPTOMS The main symptom of this condition is  heel pain. Other symptoms include:  Pain that gets worse after activity or exercise.  Pain that is worse in the morning or after resting.  Pain that goes away after you walk for a few minutes. DIAGNOSIS This condition may be diagnosed based on your signs and symptoms. Your health care provider will also do a physical exam to check for:  A tender area on the bottom of your foot.  A high arch in your foot.  Pain when you move your foot.  Difficulty moving your foot. You may also need to have imaging studies to confirm the diagnosis. These can include:  X-rays.  Ultrasound.  MRI. TREATMENT  Treatment for plantar fasciitis depends on the severity of the condition. Your treatment may include:  Rest, ice, and over-the-counter pain medicines to manage your pain.  Exercises to stretch your calves and your plantar fascia.  A splint that holds your foot in a stretched, upward position while you sleep (night splint).  Physical therapy to relieve symptoms and prevent problems in the future.  Cortisone injections to relieve severe pain.  Extracorporeal shock wave therapy (ESWT) to stimulate damaged plantar fascia with electrical impulses. It is often used as a last resort before surgery.  Surgery, if other treatments have not worked after 12 months. HOME CARE INSTRUCTIONS  Take medicines only as directed by your health care provider.  Avoid activities that cause pain.  Roll the bottom of your foot over a bag of ice or a  bottle of cold water. Do this for 20 minutes, 3-4 times a day.  Perform simple stretches as directed by your health care provider.  Try wearing athletic shoes with air-sole or gel-sole cushions or soft shoe inserts.  Wear a night splint while sleeping, if directed by your health care provider.  Keep all follow-up appointments with your health care provider. PREVENTION   Do not perform exercises or activities that cause heel pain.  Consider finding  low-impact activities if you continue to have problems.  Lose weight if you need to. The best way to prevent plantar fasciitis is to avoid the activities that aggravate your plantar fascia. SEEK MEDICAL CARE IF:  Your symptoms do not go away after treatment with home care measures.  Your pain gets worse.  Your pain affects your ability to move or do your daily activities.   This information is not intended to replace advice given to you by your health care provider. Make sure you discuss any questions you have with your health care provider.   Document Released: 05/22/2001 Document Revised: 05/18/2015 Document Reviewed: 07/07/2014 Elsevier Interactive Patient Education [employer].

## 2015-08-22 ENCOUNTER — Ambulatory Visit (INDEPENDENT_AMBULATORY_CARE_PROVIDER_SITE_OTHER): Payer: Medicare Other | Admitting: Family Medicine

## 2015-08-22 ENCOUNTER — Encounter: Payer: Self-pay | Admitting: Family Medicine

## 2015-08-22 VITALS — BP 140/78 | HR 66 | Temp 98.5°F | Wt 170.0 lb

## 2015-08-22 DIAGNOSIS — I10 Essential (primary) hypertension: Secondary | ICD-10-CM | POA: Diagnosis not present

## 2015-08-22 DIAGNOSIS — E785 Hyperlipidemia, unspecified: Secondary | ICD-10-CM | POA: Diagnosis not present

## 2015-08-22 NOTE — Assessment & Plan Note (Signed)
S: controlled previously. Mild elevation but technically still at [place] 8 goal <150/90. Took metoprolol but missed HCTZ last 2 days as was traveling could be responsible for slight increase BP Readings from Last 3 Encounters:  08/22/15 140/78  07/25/15 132/84  07/08/15 139/96  A/P:Continue current meds:  Encouraged daily compliance with HCTZ as well as metoprolol

## 2015-08-22 NOTE — Patient Instructions (Signed)
Hepatitis C check with next labs  Come fasting in about 6 months- can be your annual wellness visit.   If headache worsens- let us know and if persists at 3 months let me know as well.   Blood pressure hair high with missed dose medicine- recheck next visit as only mild elevation  Wt Readings from Last 3 Encounters:  08/22/15 170 lb (77.111 kg)  07/25/15 167 lb (75.751 kg)  07/08/15 166 lb 1.6 oz (75.342 kg)  Goal is to turn this in the opposite direction. Keep up the 3x a week at least for exercise. Now we need the dietary changes.   Have a safe trip to [place] and enjoy those grandkids!

## 2015-08-22 NOTE — Assessment & Plan Note (Signed)
S: well controlled on simvastatin 20mg . No myalgias. Does take coenzyme q10. Compliant with aspirin and fish oil as well Lab Results  Component Value Date   CHOL 153 11/12/2014   HDL 36.50* 11/12/2014   LDLCALC 84 11/12/2014   LDLDIRECT 170.3 07/09/2013   TRIG 161.0* 11/12/2014   CHOLHDL 4 11/12/2014   A/P: continue current rx with LDL goal <100
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
## 2015-08-24 ENCOUNTER — Encounter: Payer: Self-pay | Admitting: Pulmonary Disease

## 2015-08-24 ENCOUNTER — Telehealth: Payer: Self-pay | Admitting: Pulmonary Disease

## 2015-08-24 DIAGNOSIS — G4733 Obstructive sleep apnea (adult) (pediatric): Secondary | ICD-10-CM

## 2015-08-24 HISTORY — DX: Obstructive sleep apnea (adult) (pediatric): G47.33

## 2015-08-24 NOTE — Telephone Encounter (Signed)
Patient calling for results, please call her at [phone].

## 2015-08-24 NOTE — Telephone Encounter (Signed)
HST 08/15/15 >> AHI 52.9 SaO2 low 65%  Will have my nurse inform pt that sleep study shows severe sleep apnea.  Options are 1) CPAP now, 2) ROV first.  If She is agreeable to CPAP, then please send order for auto CPAP range 5 to 15 cm H2O with heated humidity and mask of choice.  Have download sent 1 month after starting CPAP and set up ROV 2 months after starting CPAP >> can be with me or Tammy Parrett.    Please contact pt on her cell number >> [phone].

## 2015-08-24 NOTE — Telephone Encounter (Signed)
Results have been explained to patient, pt expressed understanding. Order placed. Nothing further needed.  

## 2015-08-25 DIAGNOSIS — G4733 Obstructive sleep apnea (adult) (pediatric): Secondary | ICD-10-CM | POA: Diagnosis not present

## 2015-08-30 ENCOUNTER — Encounter: Payer: Self-pay | Admitting: Pulmonary Disease

## 2015-09-05 ENCOUNTER — Encounter: Payer: Self-pay | Admitting: Pulmonary Disease

## 2015-09-06 ENCOUNTER — Telehealth: Payer: Self-pay | Admitting: *Deleted

## 2015-09-06 NOTE — Telephone Encounter (Signed)
Per pt email sent in 09/06/15: --I was not able to view my full sleep study report. Will you please mail me a copy?  -- Also my husband and I are flying to [place] in March, 2017. On the airlines' website, it said I would need a copy of the prescription for the CPAP machine and also a letter from my doctor stating my diagnosis and need for the machine. Will you also mail me that information?  Thank you for those three documents. I will not see the doctor again until after my trip. I was not able to get an appointment before the trip (March 18--December 11, 2015)  Thanks,  Angela [name]  [address] [place] [address] ---  Please advise regarding letter Dr. Halford Chessman thanks

## 2015-09-07 ENCOUNTER — Encounter: Payer: Self-pay | Admitting: Pulmonary Disease

## 2015-09-07 NOTE — Telephone Encounter (Signed)
Spoke with pt, aware that the below requested documentation has been sent.  Nothing further needed.

## 2015-09-07 NOTE — Telephone Encounter (Signed)
I have printed letter and have written prescription.  Please send copy of her sleep study to her.

## 2015-09-07 NOTE — Telephone Encounter (Signed)
Patient Returned call [phone]

## 2015-09-07 NOTE — Telephone Encounter (Signed)
Sleep study printed and put in outgoing mail to the below stated address per pt.   lmtcb X1 to make pt aware of this.

## 2015-09-14 DIAGNOSIS — H524 Presbyopia: Secondary | ICD-10-CM | POA: Insufficient documentation

## 2015-09-14 DIAGNOSIS — H25012 Cortical age-related cataract, left eye: Secondary | ICD-10-CM | POA: Insufficient documentation

## 2015-09-14 DIAGNOSIS — H5203 Hypermetropia, bilateral: Secondary | ICD-10-CM | POA: Insufficient documentation

## 2015-09-20 ENCOUNTER — Ambulatory Visit (INDEPENDENT_AMBULATORY_CARE_PROVIDER_SITE_OTHER): Payer: Medicare Other | Admitting: Podiatry

## 2015-09-20 ENCOUNTER — Encounter: Payer: Self-pay | Admitting: Podiatry

## 2015-09-20 VITALS — BP 147/82 | HR 64 | Resp 12

## 2015-09-20 DIAGNOSIS — M722 Plantar fascial fibromatosis: Secondary | ICD-10-CM | POA: Diagnosis not present

## 2015-09-20 NOTE — Progress Notes (Signed)
   Subjective:    Patient ID: Angela [name], female    DOB: 1946/02/06, 70 y.o.   MRN: [id number]  HPI As patient presented initially with a 7 month history of left inferior heel pain aggravated with standing walking relieved with rest. The symptoms have gradually worsened over time. Patient reduce the amount of standing walking and substitute exercise bike . Patient currently wearing rigid foot orthotics plus fasciitis strap. On the visit of 08/16/2015 a Kenalog injection was given into the inferior left heel. She now describes the heel pain which started she says is 8/10 now is 2/10.     Review of Systems  Respiratory: Positive for apnea.        Objective:   Physical Exam  Orientated 3  Vascular: DP and PT pulses 2/4 bilaterally Capillary reflex immediate bilaterally  Neurological: Sensation to 10 g monofilament wire intact 5/5 bilaterally Vibratory sensation nonreactive bilaterally Ankle reflex equal and reactive bilaterally  Dermatological: No open skin lesions bilaterally No edema bilaterally  Musculoskeletal: Mild palpable tenderness plantar fascial insertional area left without any palpable lesions  Assessment: Reducing symptoms of plantar fasciitis left  Plan: Today I am recommending patient continue wearing custom rigid foot orthotic with fasciitis strap an ongoing daily basis in athletic style shoe also, recommended she continue stretching ongoing daily basis. Gradually increase her activity to her tolerance  Patient is discharged from active follow-up at this time    Assessment & Plan:

## 2015-09-27 ENCOUNTER — Other Ambulatory Visit: Payer: Self-pay | Admitting: Family Medicine

## 2015-10-28 ENCOUNTER — Other Ambulatory Visit: Payer: Self-pay | Admitting: *Deleted

## 2015-10-28 MED ORDER — METOPROLOL SUCCINATE ER 50 MG PO TB24: ORAL_TABLET | ORAL | Status: DC

## 2015-12-13 ENCOUNTER — Ambulatory Visit: Payer: Medicare Other | Admitting: Pulmonary Disease

## 2015-12-21 ENCOUNTER — Encounter: Payer: Self-pay | Admitting: Pulmonary Disease

## 2015-12-21 ENCOUNTER — Ambulatory Visit (INDEPENDENT_AMBULATORY_CARE_PROVIDER_SITE_OTHER): Payer: Medicare Other | Admitting: Pulmonary Disease

## 2015-12-21 VITALS — BP 108/80 | HR 65 | Ht 66.5 in | Wt 171.0 lb

## 2015-12-21 DIAGNOSIS — G4733 Obstructive sleep apnea (adult) (pediatric): Secondary | ICD-10-CM | POA: Diagnosis not present

## 2015-12-21 DIAGNOSIS — Z9989 Dependence on other enabling machines and devices: Principal | ICD-10-CM

## 2015-12-21 NOTE — Patient Instructions (Signed)
Follow up in 1 year.

## 2015-12-21 NOTE — Progress Notes (Signed)
Current Outpatient Prescriptions on File Prior to Visit  Medication Sig  . aspirin 81 MG tablet Take 81 mg by mouth every evening.   . Calcium Carbonate-Vitamin D (CALTRATE 600+D) 600-400 MG-UNIT per tablet Take 2 tablets by mouth every evening.   . Cholecalciferol (D 2000) 2000 UNITS TABS Take 1 tablet by mouth every evening.  Marland Kitchen co-enzyme Q-10 30 MG capsule Take 400 mg by mouth every evening.   . denosumab (PROLIA) 60 MG/ML SOLN injection Inject 60 mg into the skin every 6 (six) months.   . fish oil-omega-3 fatty acids 1000 MG capsule Take 1 g by mouth every evening.   . hydrochlorothiazide (HYDRODIURIL) 25 MG tablet Take 1 tablet (25 mg total) by mouth daily.  Marland Kitchen ketorolac (ACULAR) 0.5 % ophthalmic solution 1 drop. Place 1 drop into the right eye 4 (four) times daily. Dispense 90 day supply w/ 3 refills  . metoprolol succinate (TOPROL-XL) 50 MG 24 hr tablet TAKE 1 TABLET BY MOUTH ONCE DAILY WITH FOOD OR FOLLOWING A MEAL  . Multiple Vitamin (MULTIVITAMIN) tablet Take 1 tablet by mouth daily.    . potassium chloride (K-DUR,KLOR-CON) 10 MEQ tablet Take 1 tablet (10 mEq total) by mouth daily.  . simvastatin (ZOCOR) 20 MG tablet take 1 tablet by mouth at bedtime  . vitamin C (ASCORBIC ACID) 500 MG tablet Take 500 mg by mouth every evening.    No current facility-administered medications on file prior to visit.     Chief Complaint  Patient presents with  . Follow-up    Wears CPAP nightly. Denies any current issues with mask/pressure. DME: [facility]     Tests HST 08/15/15 >> AHI 52.9 SaO2 low 65% Auto CPAP 11/20/15 to 12/19/15 >> used on 29 of 30 nights with average 7 hrs 55 min.  Average AHI 0.5 with median CPAP 11 and 95 th percentile CPAP 13 cm H2O  Past medical hx HLD, Breast cancer 2009, HTN  Past surgical hx, Allergies, Family hx, Social hx all reviewed.  Vital Signs BP 108/80 mmHg  Pulse 65  Ht 5' 6.5" (1.689 m)  Wt 171 lb (77.565 kg)  BMI 27.19 kg/m2  SpO2  97%  History of Present Illness Angela [name] is a 70 y.o. female with OSA.  She loves her CPAP.  She is sleeping much better.  She has nasal mask with chin strap.  Her husband says she is not snoring anymore.    Physical Exam  General - No distress ENT - No sinus tenderness, no oral exudate, no LAN Cardiac - s1s2 regular, no murmur Chest - No wheeze/rales/dullness Back - No focal tenderness Abd - Soft, non-tender Ext - No edema Neuro - Normal strength Skin - No rashes Psych - normal mood, and behavior   Assessment/Plan  Obstructive sleep apnea. She is compliant with CPAP and reports benefit. - continue auto CPAP   Patient Instructions  Follow up in 1 year     Chesley Mires, MD [facility] Pager:  [phone] 12/21/2015, 10:38 AM

## 2016-01-06 ENCOUNTER — Encounter (HOSPITAL_COMMUNITY): Payer: Medicare Other | Attending: Hematology & Oncology

## 2016-01-06 ENCOUNTER — Encounter (HOSPITAL_COMMUNITY): Payer: Medicare Other

## 2016-01-06 ENCOUNTER — Encounter (HOSPITAL_COMMUNITY): Payer: Self-pay

## 2016-01-06 VITALS — BP 161/77 | HR 57 | Temp 98.6°F | Resp 16

## 2016-01-06 DIAGNOSIS — C50919 Malignant neoplasm of unspecified site of unspecified female breast: Secondary | ICD-10-CM | POA: Diagnosis present

## 2016-01-06 DIAGNOSIS — M8580 Other specified disorders of bone density and structure, unspecified site: Secondary | ICD-10-CM | POA: Diagnosis not present

## 2016-01-06 DIAGNOSIS — Z853 Personal history of malignant neoplasm of breast: Secondary | ICD-10-CM | POA: Diagnosis not present

## 2016-01-06 LAB — COMPREHENSIVE METABOLIC PANEL
ALBUMIN: 4 g/dL (ref 3.5–5.0)
ALT: 25 U/L (ref 14–54)
ANION GAP: 6 (ref 5–15)
AST: 25 U/L (ref 15–41)
Alkaline Phosphatase: 63 U/L (ref 38–126)
BILIRUBIN TOTAL: 0.6 mg/dL (ref 0.3–1.2)
BUN: 16 mg/dL (ref 6–20)
CHLORIDE: 107 mmol/L (ref 101–111)
CO2: 27 mmol/L (ref 22–32)
Calcium: 9.5 mg/dL (ref 8.9–10.3)
Creatinine, Ser: 0.67 mg/dL (ref 0.44–1.00)
GFR calc Af Amer: >60 mL/min (ref >60)
Glucose, Bld: 113 mg/dL — ABNORMAL HIGH (ref 65–99)
POTASSIUM: 4.1 mmol/L (ref 3.5–5.1)
Sodium: 140 mmol/L (ref 135–145)
TOTAL PROTEIN: 6.6 g/dL (ref 6.5–8.1)

## 2016-01-06 MED ORDER — DENOSUMAB 60 MG/ML ~~LOC~~ SOLN
60.0000 mg | Freq: Once | SUBCUTANEOUS | Status: AC
  Administered 2016-01-06: 60 mg via SUBCUTANEOUS
  Filled 2016-01-06: qty 1

## 2016-01-06 NOTE — Progress Notes (Signed)
Angela [name] presents today for injection per the provider's orders.  prolia administration without incident; see MAR for injection details.  Patient tolerated procedure well and without incident.  No questions or complaints noted at this time.

## 2016-02-21 ENCOUNTER — Ambulatory Visit (INDEPENDENT_AMBULATORY_CARE_PROVIDER_SITE_OTHER): Payer: Medicare Other | Admitting: Family Medicine

## 2016-02-21 ENCOUNTER — Other Ambulatory Visit: Payer: Self-pay | Admitting: Family Medicine

## 2016-02-21 ENCOUNTER — Encounter: Payer: Self-pay | Admitting: Family Medicine

## 2016-02-21 VITALS — BP 130/88 | HR 58 | Temp 97.7°F | Ht 65.5 in | Wt 165.0 lb

## 2016-02-21 DIAGNOSIS — Z7289 Other problems related to lifestyle: Secondary | ICD-10-CM | POA: Diagnosis not present

## 2016-02-21 DIAGNOSIS — E785 Hyperlipidemia, unspecified: Secondary | ICD-10-CM

## 2016-02-21 DIAGNOSIS — G4733 Obstructive sleep apnea (adult) (pediatric): Secondary | ICD-10-CM | POA: Diagnosis not present

## 2016-02-21 DIAGNOSIS — Z85828 Personal history of other malignant neoplasm of skin: Secondary | ICD-10-CM | POA: Insufficient documentation

## 2016-02-21 DIAGNOSIS — M8580 Other specified disorders of bone density and structure, unspecified site: Secondary | ICD-10-CM

## 2016-02-21 DIAGNOSIS — Z0000 Encounter for general adult medical examination without abnormal findings: Secondary | ICD-10-CM | POA: Diagnosis not present

## 2016-02-21 LAB — COMPREHENSIVE METABOLIC PANEL
ALT: 20 U/L (ref 0–35)
AST: 21 U/L (ref 0–37)
Albumin: 4.4 g/dL (ref 3.5–5.2)
Alkaline Phosphatase: 55 U/L (ref 39–117)
BUN: 17 mg/dL (ref 6–23)
CHLORIDE: 104 meq/L (ref 96–112)
CO2: 30 meq/L (ref 19–32)
CREATININE: 0.66 mg/dL (ref 0.40–1.20)
Calcium: 9.7 mg/dL (ref 8.4–10.5)
GFR: 94.33 mL/min (ref >60.00)
GLUCOSE: 100 mg/dL — AB (ref 70–99)
POTASSIUM: 3.8 meq/L (ref 3.5–5.1)
SODIUM: 141 meq/L (ref 135–145)
Total Bilirubin: 0.5 mg/dL (ref 0.2–1.2)
Total Protein: 6.5 g/dL (ref 6.0–8.3)

## 2016-02-21 LAB — CBC
HCT: 42.5 % (ref 36.0–46.0)
Hemoglobin: 14.4 g/dL (ref 12.0–15.0)
MCHC: 33.8 g/dL (ref 30.0–36.0)
MCV: 92.7 fl (ref 78.0–100.0)
Platelets: 211 10*3/uL (ref 150.0–400.0)
RBC: 4.59 Mil/uL (ref 3.87–5.11)
RDW: 12.4 % (ref 11.5–15.5)
WBC: 6.2 10*3/uL (ref 4.0–10.5)

## 2016-02-21 LAB — LIPID PANEL
CHOL/HDL RATIO: 4
Cholesterol: 149 mg/dL (ref 0–200)
HDL: 35.1 mg/dL — ABNORMAL LOW (ref >39.00)
LDL CALC: 81 mg/dL (ref 0–99)
NONHDL: 114.33
Triglycerides: 165 mg/dL — ABNORMAL HIGH (ref 0.0–149.0)
VLDL: 33 mg/dL (ref 0.0–40.0)

## 2016-02-21 LAB — TSH: TSH: 1.4 u[IU]/mL (ref 0.35–4.50)

## 2016-02-21 NOTE — Progress Notes (Signed)
Phone: [phone]  Subjective:  Patient presents today for their annual physical. Chief complaint-noted.   See problem oriented charting- ROS- full  review of systems was completed and negative including No chest pain or shortness of breath. No headache or blurry vision.   The following were reviewed and entered/updated in epic: Past Medical History  Diagnosis Date  . Hyperlipidemia   . Osteopenia   . BCC (basal cell carcinoma)   . Breast cancer ([place]) 2009  . Inflammation of bone ([place]) current    foot, in a boot for 6 weeks  . Hypertension   . Arthritis     mild  . Arthritis pain   . Tachycardia   . Macular retinal cyst of right eye   . OSA (obstructive sleep apnea) 08/24/2015   Patient Active Problem List   Diagnosis Date Noted  . Breast cancer ([place]) 03/20/2011    Priority: High  . OSA (obstructive sleep apnea) 08/24/2015    Priority: Medium  . Hyperglycemia 11/12/2014    Priority: Medium  . Osteopenia determined by x-ray 06/04/2014    Priority: Medium  . HTN (hypertension) 07/13/2008    Priority: Medium  . Hyperlipidemia 07/09/2007    Priority: Medium  . History of skin cancer 02/21/2016    Priority: Low  . Chest discomfort 10/05/2014    Priority: Low  . 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 Vitamin D deficiency 07/09/2007    Priority: Low   Past Surgical History  Procedure Laterality Date  . Bcc equired excision    . Tonsillectomy    . Breast lumpectomy  10/09    breast cancer, left  . Mass excision  7/09    appendix  . Cataract [facility]  . Breast lumpectomy      Family History  Problem Relation Age of Onset  . Heart attack Mother   . Hypertension Mother   . Heart disease Father   . Heart attack Father   . Hypertension Father   . Colon cancer Neg Hx   . Stroke Neg Hx     Medications- reviewed and updated Current Outpatient Prescriptions  Medication Sig Dispense Refill  . aspirin 81 MG tablet  Take 81 mg by mouth every evening.     . Calcium Carbonate-Vitamin D (CALTRATE 600+D) 600-400 MG-UNIT per tablet Take 2 tablets by mouth every evening.     . Cholecalciferol (D 2000) 2000 UNITS TABS Take 1 tablet by mouth every evening.    Marland Kitchen co-enzyme Q-10 30 MG capsule Take 400 mg by mouth every evening.     . denosumab (PROLIA) 60 MG/ML SOLN injection Inject 60 mg into the skin every 6 (six) months.     . fish oil-omega-3 fatty acids 1000 MG capsule Take 1 g by mouth every evening.     . hydrochlorothiazide (HYDRODIURIL) 25 MG tablet Take 1 tablet (25 mg total) by mouth daily. 90 tablet 3  . ketorolac (ACULAR) 0.5 % ophthalmic solution 1 drop. Place 1 drop into the right eye 4 (four) times daily. Dispense 90 day supply w/ 3 refills    . metoprolol succinate (TOPROL-XL) 50 MG 24 hr tablet TAKE 1 TABLET BY MOUTH ONCE DAILY WITH FOOD OR FOLLOWING A MEAL 90 tablet 1  . Multiple Vitamin (MULTIVITAMIN) tablet Take 1 tablet by mouth daily.      . potassium chloride (K-DUR,KLOR-CON) 10 MEQ tablet Take 1 tablet (10 mEq total) by mouth daily. [place]  tablet 3  . simvastatin (ZOCOR) 20 MG tablet take 1 tablet by mouth at bedtime 90 tablet 2  . vitamin C (ASCORBIC ACID) 500 MG tablet Take 500 mg by mouth every evening.      No current facility-administered medications for this visit.    Allergies-reviewed and updated Allergies  Allergen Reactions  . Lisinopril Cough  . Statins Other (See Comments)    Myalgia with lipitor- unproven She is able to tolerate simvastatin    Social History   Social History  . Marital Status: Married    Spouse Name: N/A  . Number of Children: 3  . Years of Education: N/A   Occupational History  . retired     works 1 day a week   Social History Main Topics  . Smoking status: Never Smoker   . Smokeless tobacco: Never Used  . Alcohol Use: 1.8 oz/week    3 Glasses of wine per week  . Drug Use: No  . Sexual Activity: Not Asked   Other Topics Concern  . None    Social History Narrative   Married. 2 daughters. 2 grandkids.       Retired from Materials engineer ed mostly [place]: travel, time with friends, eating out, lots of grandkids time in [place]    Objective: BP 130/88 mmHg  Pulse 58  Temp(Src) 97.7 F (36.5 C) (Oral)  Ht 5' 5.5" (1.664 m)  Wt 165 lb (74.844 kg)  BMI 27.03 kg/m2  SpO2 97% Gen: NAD, resting comfortably HEENT: Mucous membranes are moist. Oropharynx normal Neck: no thyromegaly CV: RRR no murmurs rubs or gallops Lungs: CTAB no crackles, wheeze, rhonchi Abdomen: soft/nontender/nondistended/normal bowel sounds. No rebound or guarding.  Ext: no edema Skin: warm, dry, no rash Neuro: grossly normal, moves all extremities, PERRLA  Assessment/Plan:  70 y.o. female presenting for annual physical.  Health Maintenance counseling: 1. Anticipatory guidance: Patient counseled regarding regular dental exams, eye exams, wearing seatbelts.  2. Risk factor reduction:  Advised patient of need for regular exercise and diet rich and fruits and vegetables to reduce risk of heart attack and stroke. Persists 3x a week at [facility] or walking.  Wt Readings from Last 3 Encounters:  02/21/16 165 lb (74.844 kg)  12/21/15 171 lb (77.565 kg)  08/22/15 170 lb (77.111 kg)  working on weight loss- eating more salads, portion control, cutting down on sweets 3. Immunizations/screenings/ancillary studies Immunization History  Administered Date(s) Administered  . Hep A / Hep B 03/16/2013, 10/06/2013  . Hepatitis B, adult 04/17/2013  . Influenza Split 06/09/2012  . Influenza Whole 05/22/2011  . Influenza,inj,Quad PF,36+ Mos 05/27/2013  . Influenza-Unspecified 06/10/2014, 05/24/2015  . Pneumococcal Conjugate-13 11/12/2014  . Pneumococcal Polysaccharide-23 08/24/2008, 07/17/2013  . Td 04/19/2010  . Zoster 11/17/2008   Health Maintenance Due  Topic Date Due  . Hepatitis C Screening - today [birth year]   4. Cervical cancer screening- aged out,  states no recent history abnormal  5. Breast cancer screening-  breast exam with general surgery and mammogram 06/20/15 with 10 year follow up 6. Colon cancer screening - 02/13/2008 with 10 year repeat 7. Skin cancer screening- dermatology yearly- history basal cell  Status of chronic or acute concerns  Breast cancer history- 2009 lumpectomy, radiation, tamoxifen. Dr. Marlou Starks following 10 years. Dr. Sonny Dandy of oncology started prolia- now doing at [facility] (every 6 months). She is now off arimedex.   Osteopenia- on prolia through oncologist at [facility], now doing through oncologist with  repeat 06/2015. Stable on dexa.   HLD- reasonable control on simvastatin 20mg  on last check with ldl 84  Hyperglycemia- mild elevation in CBG but not a1c in 2016- repeat lasbs today, work on healthy lifestyle choices- has lost 5 lbs  HTN-  controlled on hctz 25mg  and metoprolol 50mg  XL. Due to urinary frequency on hctz- let's have you take 1/2 a pill daily and 1/2 a pill of potassium daily. Had been taking twice a week hctz and bp control- when came off completely BPs in 140s at home.   Vision- follows with [facility]. R ey eworse 20/60 and cannot correct retinal telangiectasia.   OSA on cpap now sleeping better  Return in about 1 year (around 02/20/2017) for physical. Return precautions advised.   Orders Placed This Encounter  Procedures  . CBC    [place]  . Comprehensive metabolic panel    [place]    Order Specific Question:  Has the patient fasted?    Answer:  No  . Lipid panel    [place]    Order Specific Question:  Has the patient fasted?    Answer:  No  . TSH    [place]  . Hepatitis C antibody, reflex    [facility]   Garret Reddish, MD

## 2016-02-21 NOTE — Progress Notes (Signed)
Pre visit review using our clinic review tool, if applicable. No additional management support is needed unless otherwise documented below in the visit note. 

## 2016-02-21 NOTE — Patient Instructions (Addendum)
Due to urinary frequency on hctz- let's have you take 1/2 a pill daily and 1/2 a pill of potassium daily. You may switch back to twice a week if not doing well with above regimen. BP goal <140/90  Stop by labs before you go  I would also like for you to sign up for an annual wellness visit on a Friday with our nurse Manuela Schwartz in 6 months, that would be a free chance to check blood pressure. This is a free benefit under medicare that may help [place] find additional ways to help you.

## 2016-02-22 LAB — HEPATITIS C ANTIBODY: HCV Ab: NEGATIVE

## 2016-03-21 ENCOUNTER — Other Ambulatory Visit: Payer: Self-pay | Admitting: Dermatology

## 2016-03-26 ENCOUNTER — Encounter: Payer: Self-pay | Admitting: Family Medicine

## 2016-04-24 ENCOUNTER — Other Ambulatory Visit: Payer: Self-pay | Admitting: Family Medicine

## 2016-04-25 ENCOUNTER — Other Ambulatory Visit: Payer: Self-pay | Admitting: Family Medicine

## 2016-04-25 ENCOUNTER — Telehealth: Payer: Self-pay | Admitting: Family Medicine

## 2016-04-25 DIAGNOSIS — Z1231 Encounter for screening mammogram for malignant neoplasm of breast: Secondary | ICD-10-CM

## 2016-04-25 DIAGNOSIS — Z9889 Other specified postprocedural states: Secondary | ICD-10-CM

## 2016-04-25 DIAGNOSIS — Z853 Personal history of malignant neoplasm of breast: Secondary | ICD-10-CM

## 2016-04-25 MED ORDER — METOPROLOL SUCCINATE ER 50 MG PO TB24: ORAL_TABLET | ORAL | 1 refills | Status: DC

## 2016-04-25 NOTE — Telephone Encounter (Signed)
Pt needs refill on metoprol 50 mg #90 w/refills send to [facility] [place] [facility]. Pt is going out of town

## 2016-05-04 ENCOUNTER — Encounter: Payer: Self-pay | Admitting: Family Medicine

## 2016-05-11 ENCOUNTER — Encounter: Payer: Self-pay | Admitting: Family Medicine

## 2016-05-11 ENCOUNTER — Ambulatory Visit (INDEPENDENT_AMBULATORY_CARE_PROVIDER_SITE_OTHER): Payer: Medicare Other | Admitting: Family Medicine

## 2016-05-11 VITALS — BP 140/80 | HR 62 | Temp 97.8°F | Wt 169.2 lb

## 2016-05-11 DIAGNOSIS — R519 Headache, unspecified: Secondary | ICD-10-CM

## 2016-05-11 DIAGNOSIS — R51 Headache: Secondary | ICD-10-CM

## 2016-05-11 NOTE — Progress Notes (Signed)
Pre visit review using our clinic review tool, if applicable. No additional management support is needed unless otherwise documented below in the visit note. 

## 2016-05-11 NOTE — Patient Instructions (Signed)
We will call you within a week about your referral to MRI brain (if they put you before October- you can reschedule when they call). If you do not hear within 2 weeks, give [place] a call.   Another potential option is rebound headache. Potentially to save you an MRI- might be reasonable to try 2 weekswithout any pain medicine for headache and see if pattern resolves

## 2016-05-11 NOTE — Progress Notes (Signed)
Subjective:  Angela [name] is a 70 y.o. year old very pleasant female patient who presents for/with See problem oriented charting ROS- no unintentional weight loss, abnormal fatigue. No fever, chills, nausea, vomiting. R ear issues have completely resolved. No dizziness. No facial or extremity weakness. No slurred words or trouble swallowing. no blurry vision or double vision. No paresthesias. No confusion or word finding difficulties. see any ROS included in HPI as well.   Past Medical History-  Patient Active Problem List   Diagnosis Date Noted  . Breast cancer ([place]) 03/20/2011    Priority: High  . OSA (obstructive sleep apnea) 08/24/2015    Priority: Medium  . Hyperglycemia 11/12/2014    Priority: Medium  . Osteopenia determined by x-ray 06/04/2014    Priority: Medium  . HTN (hypertension) 07/13/2008    Priority: Medium  . Hyperlipidemia 07/09/2007    Priority: Medium  . History of skin cancer 02/21/2016    Priority: Low  . Chest discomfort 10/05/2014    Priority: Low  . 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 Vitamin D deficiency 07/09/2007    Priority: Low    Medications- reviewed and updated Current Outpatient Prescriptions  Medication Sig Dispense Refill  . aspirin 81 MG tablet Take 81 mg by mouth every evening.     . Calcium Carbonate-Vitamin D (CALTRATE 600+D) 600-400 MG-UNIT per tablet Take 2 tablets by mouth every evening.     . Cholecalciferol (D 2000) 2000 UNITS TABS Take 1 tablet by mouth every evening.    Marland Kitchen co-enzyme Q-10 30 MG capsule Take 400 mg by mouth every evening.     . denosumab (PROLIA) 60 MG/ML SOLN injection Inject 60 mg into the skin every 6 (six) months.     . fish oil-omega-3 fatty acids 1000 MG capsule Take 1 g by mouth every evening.     . hydrochlorothiazide (HYDRODIURIL) 25 MG tablet Take 1 tablet (25 mg total) by mouth daily. 90 tablet 3  . metoprolol succinate (TOPROL-XL) 50 MG 24 hr tablet take 1 tablet by mouth once daily with  food OR FOLLOWING A MEAL 90 tablet 1  . Multiple Vitamin (MULTIVITAMIN) tablet Take 1 tablet by mouth daily.      . potassium chloride (K-DUR,KLOR-CON) 10 MEQ tablet Take 1 tablet (10 mEq total) by mouth daily. 90 tablet 3  . simvastatin (ZOCOR) 20 MG tablet take 1 tablet by mouth at bedtime 90 tablet 2  . vitamin C (ASCORBIC ACID) 500 MG tablet Take 500 mg by mouth every evening.      No current facility-administered medications for this visit.     Objective: BP 140/80 (BP Location: Left Arm, Patient Position: Sitting, Cuff Size: Large)   Pulse 62   Temp 97.8 F (36.6 C) (Oral)   Wt 169 lb 3.2 oz (76.7 kg)   SpO2 97%   BMI 27.73 kg/m  Gen: NAD, resting comfortably CV: RRR no murmurs rubs or gallops Lungs: CTAB no crackles, wheeze, rhonchi Abdomen: soft/nontender/nondistended/normal bowel sounds. No rebound or guarding.  Ext: no edema Skin: warm, dry Neuro: CN II-XII intact, sensation and reflexes normal throughout, 5/5 muscle strength in bilateral upper and lower extremities. Normal finger to nose. Normal rapid alternating movements. No pronator drift. Normal romberg. Normal gait.   Assessment/Plan:  Daily Headache S: Patient hit her head October 2016 when closing back of SUV. Dull headaches on and off- had seemed to go away  For monthsbut have now returned in the last month  or two. Dull ache that is sporadic once a day- lasts 15-20 minutes. Takes an aspirin 325 x 2 and goes away. Pain 2/10.    Had an earache treated in [place] recently- not sure if related. Later had to see [place] urgent care. Had bene on amoxicillin and otitis media had cleared by time [place] urgent care visit. There was ? Of eustachian tube dysfunction but that is doing better. This came after headache starting  Started cpap in January- but was not having headaches then.  A/P:70 year old female with new chronic daily headaches with reassuring neuro exam. Will get MR brain. In addition, trial off aspirin for 2 weeks to see if  this could be rebound headache- should have enough lead time with scheduling MRI for this trial.    Home BPs in 120s/70s- advised to bring home cuff to next visit to verify- has been slightly higher intermittently here.   Orders Placed This Encounter  Procedures  . MR Brain W Wo Contrast    Standing Status:   Future    Standing Expiration Date:   07/11/2017    Order Specific Question:   If indicated for the ordered procedure, I authorize the administration of contrast media per Radiology protocol    Answer:   Yes    Order Specific Question:   Reason for Exam (SYMPTOM  OR DIAGNOSIS REQUIRED)    Answer:   hit back of head 2016 had pain few weeks but then resolves- 1-2 months of daily headache since that time starting a year later    Order Specific Question:   Preferred imaging location?    Answer:   [facility] (table limit-550lbs)    Order Specific Question:   What is the patient's sedation requirement?    Answer:   No Sedation    Order Specific Question:   Does the patient have a pacemaker or implanted devices?    Answer:   No   Return precautions advised.  Garret Reddish, MD

## 2016-05-16 ENCOUNTER — Encounter: Payer: Self-pay | Admitting: Family Medicine

## 2016-05-18 ENCOUNTER — Other Ambulatory Visit: Payer: Self-pay

## 2016-05-18 MED ORDER — LORAZEPAM 0.5 MG PO TABS: 0.5000 mg | ORAL_TABLET | Freq: Once | ORAL | 0 refills | Status: AC

## 2016-06-01 ENCOUNTER — Ambulatory Visit
Admission: RE | Admit: 2016-06-01 | Discharge: 2016-06-01 | Disposition: A | Discharge status: Home / Self Care | Payer: Medicare Other | Source: Ambulatory Visit | Attending: Family Medicine | Admitting: Family Medicine

## 2016-06-01 DIAGNOSIS — R51 Headache: Principal | ICD-10-CM

## 2016-06-01 DIAGNOSIS — R519 Headache, unspecified: Secondary | ICD-10-CM

## 2016-06-12 ENCOUNTER — Other Ambulatory Visit: Payer: Self-pay | Admitting: Family Medicine

## 2016-06-13 ENCOUNTER — Encounter: Payer: Self-pay | Admitting: Family Medicine

## 2016-07-06 ENCOUNTER — Encounter (HOSPITAL_COMMUNITY): Payer: Medicare Other | Attending: Hematology & Oncology | Admitting: Hematology & Oncology

## 2016-07-06 ENCOUNTER — Encounter (HOSPITAL_COMMUNITY): Payer: Medicare Other

## 2016-07-06 ENCOUNTER — Encounter (HOSPITAL_COMMUNITY): Payer: Self-pay

## 2016-07-06 ENCOUNTER — Encounter (HOSPITAL_COMMUNITY): Payer: Self-pay | Admitting: Hematology & Oncology

## 2016-07-06 ENCOUNTER — Encounter (HOSPITAL_COMMUNITY): Payer: Medicare Other | Attending: Hematology & Oncology

## 2016-07-06 VITALS — BP 146/83 | HR 60 | Temp 99.5°F | Resp 16 | Wt 165.5 lb

## 2016-07-06 DIAGNOSIS — M8588 Other specified disorders of bone density and structure, other site: Secondary | ICD-10-CM | POA: Diagnosis not present

## 2016-07-06 DIAGNOSIS — M8580 Other specified disorders of bone density and structure, unspecified site: Secondary | ICD-10-CM

## 2016-07-06 DIAGNOSIS — Z170 Estrogen receptor positive status [ER+]: Secondary | ICD-10-CM

## 2016-07-06 DIAGNOSIS — Z853 Personal history of malignant neoplasm of breast: Secondary | ICD-10-CM | POA: Diagnosis not present

## 2016-07-06 DIAGNOSIS — C50919 Malignant neoplasm of unspecified site of unspecified female breast: Secondary | ICD-10-CM | POA: Diagnosis not present

## 2016-07-06 DIAGNOSIS — R51 Headache: Secondary | ICD-10-CM

## 2016-07-06 DIAGNOSIS — G8929 Other chronic pain: Secondary | ICD-10-CM

## 2016-07-06 DIAGNOSIS — C50912 Malignant neoplasm of unspecified site of left female breast: Secondary | ICD-10-CM

## 2016-07-06 DIAGNOSIS — R519 Headache, unspecified: Secondary | ICD-10-CM

## 2016-07-06 LAB — COMPREHENSIVE METABOLIC PANEL
ALT: 28 U/L (ref 14–54)
ANION GAP: 5 (ref 5–15)
AST: 27 U/L (ref 15–41)
Albumin: 4.1 g/dL (ref 3.5–5.0)
Alkaline Phosphatase: 69 U/L (ref 38–126)
BUN: 12 mg/dL (ref 6–20)
CHLORIDE: 102 mmol/L (ref 101–111)
CO2: 30 mmol/L (ref 22–32)
CREATININE: 0.66 mg/dL (ref 0.44–1.00)
Calcium: 9.7 mg/dL (ref 8.9–10.3)
GFR calc Af Amer: >60 mL/min (ref >60)
GFR calc non Af Amer: >60 mL/min (ref >60)
Glucose, Bld: 86 mg/dL (ref 65–99)
POTASSIUM: 3.8 mmol/L (ref 3.5–5.1)
SODIUM: 137 mmol/L (ref 135–145)
Total Bilirubin: 0.6 mg/dL (ref 0.3–1.2)
Total Protein: 7.1 g/dL (ref 6.5–8.1)

## 2016-07-06 MED ORDER — DENOSUMAB 60 MG/ML ~~LOC~~ SOLN
60.0000 mg | Freq: Once | SUBCUTANEOUS | Status: AC
  Administered 2016-07-06: 60 mg via SUBCUTANEOUS
  Filled 2016-07-06: qty 1

## 2016-07-06 NOTE — Progress Notes (Signed)
[name] presents today for injection per MD orders. Prolia 60 mg administered SQ in left Abdomen. Administration without incident. Patient tolerated well.

## 2016-07-06 NOTE — Patient Instructions (Addendum)
[place] at [facility] Discharge Instructions  RECOMMENDATIONS MADE BY THE CONSULTANT AND ANY TEST RESULTS WILL BE SENT TO YOUR REFERRING PHYSICIAN.  You saw Dr.Penland today. You had Prolia today. Prolia in 6 months with labs. Follow up in one year with Prolia and labs after DEXA and mammo. See Amy at checkout for appointments.  Thank you for choosing [place] at [facility] to provide your oncology and hematology care.  To afford each patient quality time with our provider, please arrive at least 15 minutes before your scheduled appointment time.   Beginning January 23rd 2017 lab work for the [facility] will be done in the  Main lab at [facility] on 1st floor. If you have a lab appointment with the [facility] please come in thru the  Main Entrance and check in at the main information desk  You need to re-schedule your appointment should you arrive 10 or more minutes late.  We strive to give you quality time with our providers, and arriving late affects you and other patients whose appointments are after yours.  Also, if you no show three or more times for appointments you may be dismissed from the clinic at the providers discretion.     Again, thank you for choosing [facility].  Our hope is that these requests will decrease the amount of time that you wait before being seen by our physicians.       _____________________________________________________________  Should you have questions after your visit to [facility], please contact our office at [phone] between the hours of 8:30 a.m. and 4:30 p.m.  Voicemails left after 4:30 p.m. will not be returned until the following business day.  For prescription refill requests, have your pharmacy contact our office.         Resources For Cancer Patients and their Caregivers ? American Cancer Society: Can assist with transportation, wigs, general needs,  runs Look Good Feel Better.        279-621-6641 ? Cancer Care: Provides financial assistance, online support groups, medication/co-pay assistance.  1-800-813-HOPE 629-405-2901) ? [place] Assists [place] cancer patients and their families through emotional , educational and financial support.  431-496-7617 ? [place] DSS Where to apply for food stamps, Medicaid and utility assistance. 980-330-0097 ? [facility]: Transportation to medical appointments. (616)315-9003 ? Social Security Administration: May apply for disability if have a Stage IV cancer. 203-532-4063 (336) 479-6611 ? [employer], Disability and Transit Services: Assists with nutrition, care and transit needs. [place] Support Programs: @10RELATIVEDAYS @ > Cancer Support Group  2nd Tuesday of the month 1pm-2pm, Journey Room  > Creative Journey  3rd Tuesday of the month 1130am-1pm, Journey Room  > Look Good Feel Better  1st Wednesday of the month 10am-12 noon, Journey Room (Call [place] to register [phone])
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
## 2016-07-09 ENCOUNTER — Ambulatory Visit
Admission: RE | Admit: 2016-07-09 | Discharge: 2016-07-09 | Disposition: A | Discharge status: Home / Self Care | Payer: Medicare Other | Source: Ambulatory Visit | Attending: Family Medicine | Admitting: Family Medicine

## 2016-07-09 DIAGNOSIS — Z853 Personal history of malignant neoplasm of breast: Secondary | ICD-10-CM

## 2016-07-09 DIAGNOSIS — Z9889 Other specified postprocedural states: Secondary | ICD-10-CM

## 2016-07-09 DIAGNOSIS — Z1231 Encounter for screening mammogram for malignant neoplasm of breast: Secondary | ICD-10-CM

## 2016-07-10 ENCOUNTER — Encounter (HOSPITAL_COMMUNITY): Payer: Self-pay | Admitting: Hematology & Oncology

## 2016-07-24 ENCOUNTER — Other Ambulatory Visit: Payer: Self-pay | Admitting: Family Medicine

## 2016-07-24 DIAGNOSIS — R079 Chest pain, unspecified: Secondary | ICD-10-CM

## 2016-07-24 DIAGNOSIS — I10 Essential (primary) hypertension: Secondary | ICD-10-CM

## 2016-08-04 ENCOUNTER — Encounter (HOSPITAL_COMMUNITY): Payer: Self-pay | Admitting: Hematology & Oncology

## 2016-10-16 ENCOUNTER — Other Ambulatory Visit: Payer: Self-pay | Admitting: Family Medicine

## 2016-12-03 ENCOUNTER — Ambulatory Visit (INDEPENDENT_AMBULATORY_CARE_PROVIDER_SITE_OTHER): Payer: Medicare Other | Admitting: Podiatry

## 2016-12-03 ENCOUNTER — Encounter: Payer: Self-pay | Admitting: Podiatry

## 2016-12-03 ENCOUNTER — Ambulatory Visit (INDEPENDENT_AMBULATORY_CARE_PROVIDER_SITE_OTHER): Payer: Medicare Other

## 2016-12-03 DIAGNOSIS — M778 Other enthesopathies, not elsewhere classified: Secondary | ICD-10-CM

## 2016-12-03 DIAGNOSIS — M79675 Pain in left toe(s): Secondary | ICD-10-CM

## 2016-12-03 DIAGNOSIS — M722 Plantar fascial fibromatosis: Secondary | ICD-10-CM | POA: Diagnosis not present

## 2016-12-03 DIAGNOSIS — M779 Enthesopathy, unspecified: Secondary | ICD-10-CM | POA: Diagnosis not present

## 2016-12-03 MED ORDER — TRIAMCINOLONE ACETONIDE 10 MG/ML IJ SUSP
10.0000 mg | Freq: Once | INTRAMUSCULAR | Status: AC
  Administered 2016-12-03: 10 mg

## 2016-12-03 MED ORDER — DICLOFENAC SODIUM 75 MG PO TBEC: 75.0000 mg | DELAYED_RELEASE_TABLET | Freq: Two times a day (BID) | ORAL | 2 refills | Status: DC

## 2016-12-04 NOTE — Progress Notes (Signed)
Subjective:     Patient ID: Angela [name], female   DOB: 01/17/1946, 71 y.o.   MRN: [id number]  HPI patient points the left foot stating it's been really sore in the third metatarsal. His going to [facility] in April and is desperate to get it better and no she needs inserts long-term   Review of Systems     Objective:   Physical Exam Neurovascular status intact muscle strength adequate with patient found have inflammation pain third MPJ left with fluid buildup around the joint and moderate depression of the metatarsal    Assessment:     Acute inflammatory capsulitis left with possibility for bone injury    Plan:     H&P conditions reviewed and at this time injected with a proximal nerve block aspirated the third MPJ was able to get out a small amount of clear fluid and injected with a quarter cc of dextran some Kenalog and applied thick pad. Discussed a soft long-term orthotic but first were to make sure the problem gets better  X-ray was negative for stress fracture or advanced arthritis

## 2016-12-10 ENCOUNTER — Emergency Department (HOSPITAL_COMMUNITY): Payer: Medicare Other

## 2016-12-10 ENCOUNTER — Emergency Department (HOSPITAL_COMMUNITY)
Admission: EM | Admit: 2016-12-10 | Discharge: 2016-12-11 | Disposition: A | Discharge status: Home / Self Care | Payer: Medicare Other | Attending: Emergency Medicine | Admitting: Emergency Medicine

## 2016-12-10 ENCOUNTER — Encounter (HOSPITAL_COMMUNITY): Payer: Self-pay | Admitting: Emergency Medicine

## 2016-12-10 DIAGNOSIS — Z79899 Other long term (current) drug therapy: Secondary | ICD-10-CM | POA: Insufficient documentation

## 2016-12-10 DIAGNOSIS — R079 Chest pain, unspecified: Secondary | ICD-10-CM | POA: Diagnosis present

## 2016-12-10 DIAGNOSIS — Z7982 Long term (current) use of aspirin: Secondary | ICD-10-CM | POA: Insufficient documentation

## 2016-12-10 DIAGNOSIS — R072 Precordial pain: Secondary | ICD-10-CM | POA: Diagnosis not present

## 2016-12-10 DIAGNOSIS — I10 Essential (primary) hypertension: Secondary | ICD-10-CM | POA: Diagnosis not present

## 2016-12-10 DIAGNOSIS — Z853 Personal history of malignant neoplasm of breast: Secondary | ICD-10-CM | POA: Diagnosis not present

## 2016-12-10 LAB — D-DIMER, QUANTITATIVE (NOT AT ARMC): D DIMER QUANT: 0.62 ug{FEU}/mL — AB (ref 0.00–0.50)

## 2016-12-10 LAB — BASIC METABOLIC PANEL
Anion gap: 7 (ref 5–15)
BUN: 17 mg/dL (ref 6–20)
CALCIUM: 9.1 mg/dL (ref 8.9–10.3)
CO2: 25 mmol/L (ref 22–32)
CREATININE: 0.65 mg/dL (ref 0.44–1.00)
Chloride: 105 mmol/L (ref 101–111)
Glucose, Bld: 155 mg/dL — ABNORMAL HIGH (ref 65–99)
Potassium: 3.5 mmol/L (ref 3.5–5.1)
SODIUM: 137 mmol/L (ref 135–145)

## 2016-12-10 LAB — CBC WITH DIFFERENTIAL/PLATELET
BASOS ABS: 0 10*3/uL (ref 0.0–0.1)
BASOS PCT: 0 %
EOS ABS: 0.1 10*3/uL (ref 0.0–0.7)
Eosinophils Relative: 2 %
HCT: 40.4 % (ref 36.0–46.0)
Hemoglobin: 13.6 g/dL (ref 12.0–15.0)
Lymphocytes Relative: 24 %
Lymphs Abs: 1.9 10*3/uL (ref 0.7–4.0)
MCH: 32 pg (ref 26.0–34.0)
MCHC: 33.7 g/dL (ref 30.0–36.0)
MCV: 95.1 fL (ref 78.0–100.0)
MONO ABS: 0.5 10*3/uL (ref 0.1–1.0)
Monocytes Relative: 7 %
NEUTROS PCT: 67 %
Neutro Abs: 5.3 10*3/uL (ref 1.7–7.7)
Platelets: 213 10*3/uL (ref 150–400)
RBC: 4.25 MIL/uL (ref 3.87–5.11)
RDW: 12.3 % (ref 11.5–15.5)
WBC: 7.8 10*3/uL (ref 4.0–10.5)

## 2016-12-10 LAB — TROPONIN I

## 2016-12-10 NOTE — ED Provider Notes (Signed)
[facility] DEPT Provider Note   CSN: [id number] Arrival date & time: 12/10/16  1637     History   Chief Complaint Chief Complaint  Patient presents with  . Chest Pain    HPI Angela [name] is a 71 y.o. female.  HPI  This is a 71 year old female with a history of breast cancer and arthritis who presents with chest pain. Patient reports chest pain earlier today. She states that it comes and goes. It is pressure-like and nonradiating. The lead left side of her chest. It was nonexertional but she states that she did walk several miles today. She states that she's had chest pain in the past on the right side. She has seen cardiology and had several stress tests which have been reportedly normal. Denies any leg swelling, history of blood clots, recent hospitalization, recent travel. Currently she is pain-free.  Past Medical History:  Diagnosis Date  . Arthritis    mild  . Arthritis pain   . BCC (basal cell carcinoma)   . Breast cancer ([place]) 2009  . Hyperlipidemia   . Hypertension   . Inflammation of bone ([place]) current   foot, in a boot for 6 weeks  . Macular retinal cyst of right eye   . OSA (obstructive sleep apnea) 08/24/2015  . Osteopenia   . Tachycardia     Patient Active Problem List   Diagnosis Date Noted  . History of skin cancer 02/21/2016  . OSA (obstructive sleep apnea) 08/24/2015  . Hyperglycemia 11/12/2014  . Chest discomfort 10/05/2014  . Osteopenia determined by x-ray 06/04/2014  . 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 Breast cancer ([place]) 03/20/2011  . HTN (hypertension) 07/13/2008  . Vitamin D deficiency 07/09/2007  . Hyperlipidemia 07/09/2007    Past Surgical History:  Procedure Laterality Date  . bcc equired excision    . BREAST LUMPECTOMY  10/09   breast cancer, left  . BREAST LUMPECTOMY    . [facility]  . MASS EXCISION  7/09   appendix  . TONSILLECTOMY      OB History    Gravida Para Term Preterm AB Living    3 2 2     2    SAB TAB Ectopic Multiple Live Births                   Home Medications    Prior to Admission medications   Medication Sig Start Date End Date Taking? Authorizing Provider  aspirin 81 MG tablet Take 81 mg by mouth every evening.    Yes Historical Provider, MD  Calcium Carbonate-Vitamin D (CALTRATE 600+D) 600-400 MG-UNIT per tablet Take 2 tablets by mouth every evening.    Yes Historical Provider, MD  Cholecalciferol (D 2000) 2000 UNITS TABS Take 1 tablet by mouth every evening.   Yes Historical Provider, MD  co-enzyme Q-10 30 MG capsule Take 400 mg by mouth every evening.    Yes Historical Provider, MD  denosumab (PROLIA) 60 MG/ML SOLN injection Inject 60 mg into the skin every 6 (six) months.    Yes Historical Provider, MD  diclofenac (VOLTAREN) 75 MG EC tablet Take 1 tablet (75 mg total) by mouth 2 (two) times daily. 12/03/16  Yes Wallene Huh, DPM  fish oil-omega-3 fatty acids 1000 MG capsule Take 1 g by mouth every evening.    Yes Historical Provider, MD  hydrochlorothiazide (HYDRODIURIL) 25 MG tablet take 1 tablet by mouth once daily 07/24/16  Yes Brayton Mars  Yong Channel, MD  metoprolol succinate (TOPROL-XL) 50 MG 24 hr tablet take 1 tablet by mouth once daily with food OR FOLLOWING A MEAL 04/25/16  Yes Marin Olp, MD  Multiple Vitamin (MULTIVITAMIN) tablet Take 1 tablet by mouth daily.     Yes Historical Provider, MD  potassium chloride (K-DUR,KLOR-CON) 10 MEQ tablet take 1 tablet by mouth once daily 07/24/16  Yes Marin Olp, MD  simvastatin (ZOCOR) 20 MG tablet take 1 tablet by mouth at bedtime 06/13/16  Yes Marin Olp, MD  vitamin C (ASCORBIC ACID) 500 MG tablet Take 500 mg by mouth every evening.    Yes Historical Provider, MD    Family History Family History  Problem Relation Age of Onset  . Heart attack Mother   . Hypertension Mother   . Heart disease Father   . Heart attack Father   . Hypertension Father   . Colon cancer Neg Hx   . Stroke Neg  Hx     Social History Social History  Substance Use Topics  . Smoking status: Never Smoker  . Smokeless tobacco: Never Used  . Alcohol use 1.8 oz/week    3 Glasses of wine per week     Allergies   Lisinopril and Statins   Review of Systems Review of Systems  Constitutional: Negative for fever.  Respiratory: Negative for cough and shortness of breath.   Cardiovascular: Positive for chest pain.  Gastrointestinal: Negative for abdominal pain, nausea and vomiting.  All other systems reviewed and are negative.    Physical Exam Updated Vital Signs BP (!) 155/82   Pulse 71   Temp 98.5 F (36.9 C) (Oral)   Resp 12   Ht 5\' 6"  (1.676 m)   Wt 170 lb (77.1 kg)   SpO2 97%   BMI 27.44 kg/m   Physical Exam  Constitutional: She is oriented to person, place, and time. She appears well-developed and well-nourished.  HENT:  Head: Normocephalic and atraumatic.  Cardiovascular: Normal rate, regular rhythm and normal heart sounds.   Pulmonary/Chest: Effort normal and breath sounds normal. No respiratory distress. She has no wheezes. She exhibits no tenderness.  Abdominal: Soft. There is no tenderness.  Musculoskeletal: She exhibits no edema.  Neurological: She is alert and oriented to person, place, and time.  Skin: Skin is warm and dry.  Psychiatric: She has a normal mood and affect.  Nursing note and vitals reviewed.    ED Treatments / Results  Labs (all labs ordered are listed, but only abnormal results are displayed) Labs Reviewed  BASIC METABOLIC PANEL - Abnormal; Notable for the following:       Result Value   Glucose, Bld 155 (*)    All other components within normal limits  D-DIMER, QUANTITATIVE (NOT AT [facility]) - Abnormal; Notable for the following:    D-Dimer, Quant 0.62 (*)    All other components within normal limits  TROPONIN I  CBC WITH DIFFERENTIAL/PLATELET  TROPONIN I    EKG  EKG Interpretation  Date/Time:  Monday December 10 2016 16:53:52  EDT Ventricular Rate:  78 PR Interval:  172 QRS Duration: 90 QT Interval:  424 QTC Calculation: 483 R Axis:   31 Text Interpretation:  Normal sinus rhythm Minimal voltage criteria for LVH, may be normal variant Borderline ECG No significant change since last tracing Confirmed by Cyrstal Leitz  MD, Jameir Ake ([id number]) on 12/10/2016 11:03:22 PM       Radiology Dg Chest 2 View  Result Date: 12/10/2016 CLINICAL DATA:  Left-sided chest pain. EXAM: CHEST  2 VIEW COMPARISON:  09/14/2014 . FINDINGS: Mediastinum hilar structures are normal. Low lung volumes with bibasilar subsegmental atelectasis. No pleural effusion or pneumothorax. Mild cardiomegaly with normal pulmonary vascularity. No acute bony abnormality . IMPRESSION: Low lung volumes with mild bibasilar subsegmental atelectasis. Electronically Signed   By: Marcello Moores  Register   On: 12/10/2016 17:21    Procedures Procedures (including critical care time)  Medications Ordered in ED Medications - No data to display   Initial Impression / Assessment and Plan / ED Course  I have reviewed the triage vital signs and the nursing notes.  Pertinent labs & imaging results that were available during my care of the patient were reviewed by me and considered in my medical decision making (see chart for details).     She presents with chest pain. Somewhat atypical for ACS. She has previously had chest pain evaluated by cardiology. She had a negative stress test in 2013. Last progress note from cardiology felt that her pain was fairly atypical. EKG is reassuring. Troponin 2 is negative. D-dimer is negative based on age adjusted criteria. She is remained symptom-free while in the emergency department. Follow-up cardiology recommended as she may need repeat stress testing and/or cardiac catheterization given recurrent nature of her symptoms.  After history, exam, and medical workup I feel the patient has been appropriately medically screened and is safe for  discharge home. Pertinent diagnoses were discussed with the patient. Patient was given return precautions.   Final Clinical Impressions(s) / ED Diagnoses   Final diagnoses:  Precordial pain    New Prescriptions New Prescriptions   No medications on file     Merryl Hacker, MD 12/11/16 [phone]

## 2016-12-10 NOTE — ED Notes (Signed)
ekg given to dr Jeanell Sparrow

## 2016-12-10 NOTE — ED Triage Notes (Signed)
Pt has been having intermittent chest pains that is aching to left side. Denies radiation. Denies sob/dizziness/or sweating. Denies any pain at prsent. Mild worseing with palpation. nad. Color wnl.

## 2016-12-11 LAB — TROPONIN I: Troponin I: <0.03 ng/mL (ref <0.03)

## 2016-12-11 NOTE — ED Notes (Signed)
Pt ambulatory to waiting room. Pt verbalized understanding of discharge instructions.   

## 2016-12-11 NOTE — Discharge Instructions (Signed)
Nursing today for chest pain. Your workup is reassuring. Follow-up with cardiology for further evaluation if you have any ongoing symptoms.

## 2016-12-15 ENCOUNTER — Encounter: Payer: Self-pay | Admitting: Family Medicine

## 2016-12-15 ENCOUNTER — Encounter: Payer: Self-pay | Admitting: Pulmonary Disease

## 2016-12-17 ENCOUNTER — Other Ambulatory Visit: Payer: Self-pay

## 2016-12-17 ENCOUNTER — Ambulatory Visit (INDEPENDENT_AMBULATORY_CARE_PROVIDER_SITE_OTHER): Payer: Medicare Other | Admitting: Podiatry

## 2016-12-17 DIAGNOSIS — G4733 Obstructive sleep apnea (adult) (pediatric): Secondary | ICD-10-CM

## 2016-12-17 DIAGNOSIS — M7752 Other enthesopathy of left foot: Secondary | ICD-10-CM | POA: Diagnosis not present

## 2016-12-17 DIAGNOSIS — M779 Enthesopathy, unspecified: Principal | ICD-10-CM

## 2016-12-17 DIAGNOSIS — M778 Other enthesopathies, not elsewhere classified: Secondary | ICD-10-CM

## 2016-12-17 NOTE — Progress Notes (Signed)
Subjective:     Patient ID: Angela [name], female   DOB: 1946/01/05, 71 y.o.   MRN: [id number]  HPI patient states it's improved but still painful on the left foot with patient also noted to have a prominent metatarsal   Review of Systems     Objective:   Physical Exam Neurovascular status intact with patient noted to have prominent third metatarsal left with inflammation pain surrounding the area and noted to have inflammatory capsulitis    Assessment:     Plantarflexed metatarsal with inflammation around the joint surface with no signs of advanced pathology    Plan:     Continued pad usage which were dispensed today. At this point I went ahead and I have recommended orthotic therapy to reduce the stress against the third metatarsal and patient is scanned for customized orthotic device

## 2016-12-20 ENCOUNTER — Encounter: Payer: Self-pay | Admitting: Pulmonary Disease

## 2016-12-20 NOTE — Telephone Encounter (Signed)
FYI for VS

## 2017-01-04 ENCOUNTER — Ambulatory Visit (HOSPITAL_COMMUNITY): Payer: Medicare Other

## 2017-01-04 ENCOUNTER — Other Ambulatory Visit (HOSPITAL_COMMUNITY): Payer: Medicare Other

## 2017-01-08 ENCOUNTER — Other Ambulatory Visit (HOSPITAL_COMMUNITY): Payer: Self-pay | Admitting: *Deleted

## 2017-01-08 ENCOUNTER — Encounter: Payer: Self-pay | Admitting: Podiatry

## 2017-01-08 ENCOUNTER — Other Ambulatory Visit: Payer: Medicare Other

## 2017-01-08 DIAGNOSIS — C50912 Malignant neoplasm of unspecified site of left female breast: Secondary | ICD-10-CM

## 2017-01-09 ENCOUNTER — Other Ambulatory Visit: Payer: Medicare Other

## 2017-01-09 ENCOUNTER — Other Ambulatory Visit (HOSPITAL_COMMUNITY): Payer: Medicare Other

## 2017-01-09 ENCOUNTER — Ambulatory Visit (HOSPITAL_COMMUNITY): Payer: Medicare Other

## 2017-01-10 ENCOUNTER — Ambulatory Visit (INDEPENDENT_AMBULATORY_CARE_PROVIDER_SITE_OTHER): Payer: Medicare Other | Admitting: Podiatry

## 2017-01-10 ENCOUNTER — Encounter: Payer: Self-pay | Admitting: Pulmonary Disease

## 2017-01-10 ENCOUNTER — Ambulatory Visit (INDEPENDENT_AMBULATORY_CARE_PROVIDER_SITE_OTHER): Payer: Medicare Other | Admitting: Pulmonary Disease

## 2017-01-10 VITALS — BP 138/82 | HR 66 | Ht 66.5 in | Wt 171.2 lb

## 2017-01-10 DIAGNOSIS — Z9989 Dependence on other enabling machines and devices: Secondary | ICD-10-CM

## 2017-01-10 DIAGNOSIS — L600 Ingrowing nail: Secondary | ICD-10-CM | POA: Diagnosis not present

## 2017-01-10 DIAGNOSIS — G4733 Obstructive sleep apnea (adult) (pediatric): Secondary | ICD-10-CM

## 2017-01-10 NOTE — Patient Instructions (Signed)

## 2017-01-10 NOTE — Patient Instructions (Signed)
Follow up in 1 year.

## 2017-01-10 NOTE — Progress Notes (Signed)
Current Outpatient Prescriptions on File Prior to Visit  Medication Sig  . aspirin 81 MG tablet Take 81 mg by mouth every evening.   . Calcium Carbonate-Vitamin D (CALTRATE 600+D) 600-400 MG-UNIT per tablet Take 2 tablets by mouth every evening.   . Cholecalciferol (D 2000) 2000 UNITS TABS Take 1 tablet by mouth every evening.  Marland Kitchen co-enzyme Q-10 30 MG capsule Take 400 mg by mouth every evening.   . denosumab (PROLIA) 60 MG/ML SOLN injection Inject 60 mg into the skin every 6 (six) months.   . fish oil-omega-3 fatty acids 1000 MG capsule Take 1 g by mouth every evening.   . hydrochlorothiazide (HYDRODIURIL) 25 MG tablet take 1 tablet by mouth once daily  . metoprolol succinate (TOPROL-XL) 50 MG 24 hr tablet take 1 tablet by mouth once daily with food OR FOLLOWING A MEAL  . Multiple Vitamin (MULTIVITAMIN) tablet Take 1 tablet by mouth daily.    . potassium chloride (K-DUR,KLOR-CON) 10 MEQ tablet take 1 tablet by mouth once daily  . simvastatin (ZOCOR) 20 MG tablet take 1 tablet by mouth at bedtime  . vitamin C (ASCORBIC ACID) 500 MG tablet Take 500 mg by mouth every evening.    No current facility-administered medications on file prior to visit.      Chief Complaint  Patient presents with  . Follow-up    Wears CPAP nightly. Denies problems with pressure. Pt wnts to discuss changing mask - currently on nasal mask. DME: [facility]    Sleep tests HST 08/15/15 >> AHI 52.9 SaO2 low 65% Auto CPAP 12/11/16 to 01/09/17 >> used on 29 of 30 nights with average 8 hrs 51 min.  Average AHI 0.4 with median CPAP 11 and 95 th percentile CPAP 13 cm H2O  Past medical history HLD, Breast cancer 2009, HTN  Past surgical history, Family history, Social history, Allergies reviewed  Vital Signs BP 138/82 (BP Location: Right Arm, Cuff Size: Normal)   Pulse 66   Ht 5' 6.5" (1.689 m)   Wt 171 lb 3.2 oz (77.7 kg)   SpO2 98%   BMI 27.22 kg/m   History of Present Illness Angela [name] is a 71 y.o. female with  OSA.  She has been doing well with CPAP.  She gets irritation over her nose and has to use band aid when using CPAP.  No other issues with mask fit.  She was told by eye doctor that treating sleep apnea has improved her vision some.  Assessment/Plan  Obstructive sleep apnea. - she is compliant with CPAP and reports benefit from therapy - continue auto CPAP - she will call if she finds a different mask she would like to try - she will call next year if she needs a letter for travelling with CPAP   Patient Instructions  Follow up in 1 year   Time spent 18 minutes  Chesley Mires, MD [facility] Pager:  [phone] 01/10/2017, 11:52 AM

## 2017-01-10 NOTE — Progress Notes (Signed)
Patient came in and had issue of FO not fitting proper in shoes.Marland KitchenMarland KitchenI took out inner sock liner and they fit fine.Marland KitchenMarland Kitchen

## 2017-01-11 ENCOUNTER — Other Ambulatory Visit: Payer: Self-pay

## 2017-01-12 NOTE — Progress Notes (Signed)
Subjective:    Patient ID: Angela [name], female   DOB: 71 y.o.   MRN: [id number]   HPI patient presents stating I'm having pain in his left big toenail and I'm not sure if it's related to the inserts that I'm wearing and whether that's part of the problem    ROS      Objective:  Physical Exam Neurovascular status intact with patient found have incurvated left hallux medial border that's painful when pressed irritated with slight distal redness but no active drainage    Assessment:    Ingrown toenail deformity that's part of the pathology that she is experiencing     Plan:    Recommended nail corner removal explaining procedure and risk and today I infiltrated the hallux 60 mg Xylocaine Marcaine mixture removed the corner exposed matrix and applied phenol 3 applications 30 seconds followed by alcohol lavaged sterile dressing. Gave instructions on soaks and reappoint

## 2017-01-13 ENCOUNTER — Encounter: Payer: Self-pay | Admitting: Podiatry

## 2017-01-14 ENCOUNTER — Telehealth (HOSPITAL_COMMUNITY): Payer: Self-pay | Admitting: *Deleted

## 2017-01-14 ENCOUNTER — Encounter: Payer: Self-pay | Admitting: Cardiovascular Disease

## 2017-01-14 ENCOUNTER — Other Ambulatory Visit: Payer: Medicare Other

## 2017-01-14 ENCOUNTER — Ambulatory Visit (INDEPENDENT_AMBULATORY_CARE_PROVIDER_SITE_OTHER): Payer: Medicare Other | Admitting: Cardiovascular Disease

## 2017-01-14 VITALS — BP 126/86 | HR 69 | Ht 66.0 in | Wt 170.8 lb

## 2017-01-14 DIAGNOSIS — I10 Essential (primary) hypertension: Secondary | ICD-10-CM | POA: Diagnosis not present

## 2017-01-14 DIAGNOSIS — E876 Hypokalemia: Secondary | ICD-10-CM

## 2017-01-14 DIAGNOSIS — E782 Mixed hyperlipidemia: Secondary | ICD-10-CM | POA: Diagnosis not present

## 2017-01-14 DIAGNOSIS — R079 Chest pain, unspecified: Secondary | ICD-10-CM | POA: Diagnosis not present

## 2017-01-14 LAB — COMPREHENSIVE METABOLIC PANEL
ALK PHOS: 90 IU/L (ref 39–117)
ALT: 29 IU/L (ref 0–32)
AST: 26 IU/L (ref 0–40)
Albumin/Globulin Ratio: 2 (ref 1.2–2.2)
Albumin: 4.7 g/dL (ref 3.5–4.8)
BUN/Creatinine Ratio: 26 (ref 12–28)
BUN: 18 mg/dL (ref 8–27)
Bilirubin Total: 0.4 mg/dL (ref 0.0–1.2)
CALCIUM: 10.4 mg/dL — AB (ref 8.7–10.3)
CO2: 24 mmol/L (ref 18–29)
CREATININE: 0.7 mg/dL (ref 0.57–1.00)
Chloride: 97 mmol/L (ref 96–106)
GFR calc Af Amer: 102 mL/min/{1.73_m2} (ref >59)
GFR, EST NON AFRICAN AMERICAN: 88 mL/min/{1.73_m2} (ref >59)
GLOBULIN, TOTAL: 2.3 g/dL (ref 1.5–4.5)
GLUCOSE: 94 mg/dL (ref 65–99)
Potassium: 4.4 mmol/L (ref 3.5–5.2)
SODIUM: 140 mmol/L (ref 134–144)
Total Protein: 7 g/dL (ref 6.0–8.5)

## 2017-01-14 LAB — LIPID PANEL
CHOL/HDL RATIO: 5 ratio — AB (ref 0.0–4.4)
CHOLESTEROL TOTAL: 169 mg/dL (ref 100–199)
HDL: 34 mg/dL — ABNORMAL LOW (ref >39)
LDL CALC: 90 mg/dL (ref 0–99)
Triglycerides: 224 mg/dL — ABNORMAL HIGH (ref 0–149)
VLDL CHOLESTEROL CAL: 45 mg/dL — AB (ref 5–40)

## 2017-01-14 MED ORDER — POTASSIUM CHLORIDE ER 20 MEQ PO TBCR: 20.0000 meq | EXTENDED_RELEASE_TABLET | Freq: Every day | ORAL | 11 refills | Status: DC

## 2017-01-14 MED ORDER — NITROGLYCERIN 0.4 MG SL SUBL: 0.4000 mg | SUBLINGUAL_TABLET | SUBLINGUAL | 6 refills | Status: DC | PRN

## 2017-01-14 NOTE — Patient Instructions (Signed)
Medication Instructions:  INCREASE Kdur (Potassium) to 20 meq once daily   Labwork: TODAY - cholesterol, complete metabolic panel  Your physician recommends that you return for lab work in: 3 weeks for basic metabolic panel   Testing/Procedures: Your physician has requested that you have a lexiscan myoview. For further information please visit [email or web address]. Please follow instruction sheet, as given.    Follow-Up: Your physician wants you to follow-up in: 1 year with Dr. Acie Fredrickson.  You will receive a reminder letter in the mail two months in advance. If you don't receive a letter, please call our office to schedule the follow-up appointment.   If you need a refill on your cardiac medications before your next appointment, please call your pharmacy.   Thank you for choosing [facility]! Christen Bame, RN [phone]

## 2017-01-14 NOTE — Telephone Encounter (Signed)
Patient given detailed instructions per Myocardial Perfusion Study Information Sheet for the test on 01/16/17 at 0730. Patient notified to arrive 15 minutes early and that it is imperative to arrive on time for appointment to keep from having the test rescheduled.  If you need to cancel or reschedule your appointment, please call the office within 24 hours of your appointment. Failure to do so may result in a cancellation of your appointment, and a $50 no show fee. Patient verbalized understanding.Madhavi Hamblen, Ranae Palms

## 2017-01-14 NOTE — Telephone Encounter (Signed)
Left message on voicemail in reference to upcoming appointment scheduled for 01/16/17. Phone number given for a call back so details instructions can be given. Angela [name], Angela [name]

## 2017-01-14 NOTE — Progress Notes (Signed)
Cardiology Office Note   Date:  01/14/2017   ID:  BAYLYNN SHIFFLETT, DOB 1946/02/26, MRN [id number]  PCP:  Marin Olp, MD  Cardiologist:   Mertie Moores, MD   Chief Complaint  Patient presents with  . Follow-up    HTN, chest pain    Problem List 1. Chest pain  2. Breast Cancer 3, Hypertension. 4. Hyperlipidemia 5. Macular cyst ( right eye)    History of Present Illness: Angela [name] is a 71 y.o. female who presents for  Who I have seen for evaluation for chest pain in 2013. She had a negative myoview at that time  She has continued to have some some tachycardia and intermittant chest pain.  Also has HTN. Since Christmas , has had some chest tightness in her left breast   CP do not last very long - perhaps ,  Last a few seconds. Felt like she has a knot in her breast  Tightness in her right neck  She exercises on occasion - twice a week - goes to the [facility].  These CP do not worsen with exertion.  Not affected with eating or drinking or change of position.  Has indigestion / GERD on occasion.    BP has been elevated most of her readings.   Aug. 3, 2016:    Is doing well. BP log shows normal BP readings at home.  Works out at [employer] No further episodes of CP  Still has indigestion at times. ( after eating spicy foods )   Jan 14, 2017  Doing well from a cardiac standpoint.  Having some back issues. Has been having some arthritis issues ( capsulitis in her feet)  Is on Diclofenac Has been having some chest pain . [facility] , was evaluated [facility], ruled out  Has some DOE ( which is new)  She has had some chest pain off and on for years.   Past Medical History:  Diagnosis Date  . Arthritis    mild  . Arthritis pain   . BCC (basal cell carcinoma)   . Breast cancer ([place]) 2009  . Hyperlipidemia   . Hypertension   . Inflammation of bone ([place]) current   foot, in a boot for 6 weeks  . Macular retinal cyst of right eye   . OSA (obstructive  sleep apnea) 08/24/2015  . Osteopenia   . Tachycardia     Past Surgical History:  Procedure Laterality Date  . bcc equired excision    . BREAST LUMPECTOMY  10/09   breast cancer, left  . BREAST LUMPECTOMY    . [facility]  . MASS EXCISION  7/09   appendix  . TONSILLECTOMY       Current Outpatient Prescriptions  Medication Sig Dispense Refill  . aspirin 81 MG tablet Take 81 mg by mouth every evening.     . Calcium Carbonate-Vitamin D (CALTRATE 600+D) 600-400 MG-UNIT per tablet Take 2 tablets by mouth every evening.     . Cholecalciferol (D 2000) 2000 UNITS TABS Take 1 tablet by mouth every evening.    Marland Kitchen co-enzyme Q-10 30 MG capsule Take 400 mg by mouth every evening.     . fish oil-omega-3 fatty acids 1000 MG capsule Take 1 g by mouth every evening.     . hydrochlorothiazide (HYDRODIURIL) 25 MG tablet take 1 tablet by mouth once daily 90 tablet 3  . metoprolol succinate (TOPROL-XL) 50 MG  24 hr tablet take 1 tablet by mouth once daily with food OR FOLLOWING A MEAL 90 tablet 1  . Multiple Vitamin (MULTIVITAMIN) tablet Take 1 tablet by mouth daily.      . potassium chloride (K-DUR,KLOR-CON) 10 MEQ tablet take 1 tablet by mouth once daily 90 tablet 3  . simvastatin (ZOCOR) 20 MG tablet take 1 tablet by mouth at bedtime 90 tablet 2  . vitamin C (ASCORBIC ACID) 500 MG tablet Take 500 mg by mouth every evening.     . denosumab (PROLIA) 60 MG/ML SOLN injection Inject 60 mg into the skin every 6 (six) months.      No current facility-administered medications for this visit.     Allergies:   Lisinopril and Statins    Social History:  The patient  reports that she has never smoked. She has never used smokeless tobacco. She reports that she drinks about 1.8 oz of alcohol per week . She reports that she does not use drugs.   Family History:  The patient's family history includes Heart attack in her father and mother; Heart disease in her father; Hypertension  in her father and mother.    ROS:  Please see the history of present illness.   Otherwise, review of systems are positive for none.   All other systems are reviewed and negative.    PHYSICAL EXAM: VS:  BP 126/86   Pulse 69   Ht 5\' 6"  (1.676 m)   Wt 170 lb 12.8 oz (77.5 kg)   SpO2 97%   BMI 27.57 kg/m  , BMI Body mass index is 27.57 kg/m. GEN: Well nourished, well developed, in no acute distress  HEENT: normal  Neck: no JVD, carotid bruits, or masses Cardiac: RRR; no murmurs, rubs, or gallops,no edema  Respiratory:  clear to auscultation bilaterally, normal work of breathing GI: soft, nontender, nondistended, + BS MS: no deformity or atrophy  Skin: warm and dry, no rash Neuro:  Strength and sensation are intact Psych: normal   EKG:  EKG is ordered today. The ekg ordered today demonstrates NSR at 86.    Recent Labs: 02/21/2016: TSH 1.40 07/06/2016: ALT 28 12/10/2016: BUN 17; Creatinine, Ser 0.65; Hemoglobin 13.6; Platelets 213; Potassium 3.5; Sodium 137    Lipid Panel    Component Value Date/Time   CHOL 149 02/21/2016 0838   TRIG 165.0 (H) 02/21/2016 0838   HDL 35.10 (L) 02/21/2016 0838   CHOLHDL 4 02/21/2016 0838   VLDL 33.0 02/21/2016 0838   LDLCALC 81 02/21/2016 0838   LDLDIRECT 170.3 07/09/2013 0813      Wt Readings from Last 3 Encounters:  01/14/17 170 lb 12.8 oz (77.5 kg)  01/10/17 171 lb 3.2 oz (77.7 kg)  12/10/16 170 lb (77.1 kg)      Other studies Reviewed: Additional studies/ records that were reviewed today include: .previous myoview Review of the above records demonstrates:  No ischemia   ASSESSMENT AND PLAN:  1.  Chest discomfort:  Her CP sounds very atypical But she has been having some shortness of breath and chest tightness with exertion that sounds a little concerning. She's had chest pain off and on for years. She presented to [facility] with episode of chest pain and ruled out for myocardial infarction. We'll schedule her for a  [employer] study for further evaluation. We will also refill her nitroglycerin.  2. HTN:  BP is well controlled with the addition of HCTZ and kdur.     Her potassium  was 3.5 when she was at [facility]. We will increase her potassium chloride to 20 mEq a day and recheck her basic medical profile in 3 weeks.   Current medicines are reviewed at length with the patient today.  The patient does not have concerns regarding medicines.  The following changes have been made:  no change  Labs/ tests ordered today include:  No orders of the defined types were placed in this encounter.   Disposition:   FU with me in 1 year .     Signed, Mertie Moores, MD  01/14/2017 8:01 AM    [facility] [address], [place], [place]  [address] Phone: [phone]; Fax: [phone]

## 2017-01-16 ENCOUNTER — Ambulatory Visit (HOSPITAL_COMMUNITY): Payer: Medicare Other | Attending: Cardiovascular Disease

## 2017-01-16 DIAGNOSIS — R079 Chest pain, unspecified: Secondary | ICD-10-CM | POA: Insufficient documentation

## 2017-01-16 LAB — MYOCARDIAL PERFUSION IMAGING
CSEPPHR: 93 {beats}/min
LVDIAVOL: 105 mL (ref 46–106)
LVSYSVOL: 39 mL
RATE: 0.25
Rest HR: 57 {beats}/min
SDS: 4
SRS: 2
SSS: 6
TID: 0.95

## 2017-01-16 MED ORDER — REGADENOSON 0.4 MG/5ML IV SOLN
0.4000 mg | Freq: Once | INTRAVENOUS | Status: AC
  Administered 2017-01-16: 0.4 mg via INTRAVENOUS

## 2017-01-16 MED ORDER — TECHNETIUM TC 99M TETROFOSMIN IV KIT
32.8000 | PACK | Freq: Once | INTRAVENOUS | Status: AC | PRN
  Administered 2017-01-16: 32.8 via INTRAVENOUS
  Filled 2017-01-16: qty 33

## 2017-01-16 MED ORDER — TECHNETIUM TC 99M TETROFOSMIN IV KIT
10.2000 | PACK | Freq: Once | INTRAVENOUS | Status: AC | PRN
  Administered 2017-01-16: 10.2 via INTRAVENOUS
  Filled 2017-01-16: qty 11

## 2017-01-18 ENCOUNTER — Encounter: Payer: Self-pay | Admitting: Podiatry

## 2017-01-18 ENCOUNTER — Telehealth: Payer: Self-pay | Admitting: Podiatry

## 2017-01-18 NOTE — Telephone Encounter (Signed)
Patient called requesting an antibiotic. Saw Dr. Paulla Dolly last Thursday 03 May where he removed a portion of her toenail. She stated it is now red and very painful. Patient is on the way to [place]. Stated she has a pharmacy in [place]. [facility] [place] on [address] and their phone number is     [phone]. If he cannot call in an antibiotic she stated she could go to a walk in clinic. Also stated that she took a picture of it and if someone wanted to share their cell phone number with her she could text the picture for the nurse to see.

## 2017-01-28 ENCOUNTER — Encounter: Payer: Self-pay | Admitting: Cardiovascular Disease

## 2017-02-05 ENCOUNTER — Encounter: Payer: Self-pay | Admitting: Podiatry

## 2017-02-05 ENCOUNTER — Other Ambulatory Visit: Payer: Medicare Other

## 2017-02-05 ENCOUNTER — Other Ambulatory Visit: Payer: Self-pay | Admitting: Family Medicine

## 2017-02-05 ENCOUNTER — Ambulatory Visit (INDEPENDENT_AMBULATORY_CARE_PROVIDER_SITE_OTHER): Payer: Medicare Other | Admitting: Podiatry

## 2017-02-05 DIAGNOSIS — I10 Essential (primary) hypertension: Secondary | ICD-10-CM

## 2017-02-05 DIAGNOSIS — Z1231 Encounter for screening mammogram for malignant neoplasm of breast: Secondary | ICD-10-CM

## 2017-02-05 DIAGNOSIS — E876 Hypokalemia: Secondary | ICD-10-CM

## 2017-02-05 DIAGNOSIS — L608 Other nail disorders: Secondary | ICD-10-CM | POA: Diagnosis not present

## 2017-02-05 DIAGNOSIS — E782 Mixed hyperlipidemia: Secondary | ICD-10-CM

## 2017-02-05 DIAGNOSIS — R079 Chest pain, unspecified: Secondary | ICD-10-CM

## 2017-02-05 LAB — BASIC METABOLIC PANEL
BUN / CREAT RATIO: 23 (ref 12–28)
BUN: 17 mg/dL (ref 8–27)
CO2: 26 mmol/L (ref 18–29)
CREATININE: 0.73 mg/dL (ref 0.57–1.00)
Calcium: 10 mg/dL (ref 8.7–10.3)
Chloride: 99 mmol/L (ref 96–106)
GFR, EST AFRICAN AMERICAN: 96 mL/min/{1.73_m2} (ref >59)
GFR, EST NON AFRICAN AMERICAN: 84 mL/min/{1.73_m2} (ref >59)
GLUCOSE: 88 mg/dL (ref 65–99)
Potassium: 4.1 mmol/L (ref 3.5–5.2)
Sodium: 139 mmol/L (ref 134–144)

## 2017-02-05 NOTE — Progress Notes (Signed)
Patient ID: Angela [name], female   DOB: 04/24/1946, 71 y.o.   MRN: [id number]   Subjective: This patient presents today complaining of approximately 2 week history of some mild discomfort along the medial margin the right hallux toenail. No prior history or treatment for this problem also, patient has noticed low-grade redness around the phenol matricectomy on the medial margin of the left hallux toenail postop 01/10/2014. She denies pain, drainage in the surgical site  Objective: Orientated 3 DP and PT pulses 2/4 bilaterally Capillary reflex immediate bilaterally Sensation to 10 g monofilament wire intact 5/5 bilaterally Vibratory sedation reactive bilaterally Ankle reflexes reactive bilaterally Light hair growth bilaterally Atrophic skin bilaterally Low-grade erythema and crusting along the medial border left hallux toenail Incurvated medial border right hallux toenail with callused nail groove Manual motor testing dorsi flexion, plantar flexion 5/5 bilaterally  Assessment: Satisfactory neurovascular status Postop phenol matricectomy medial margin left hallux toenail without obvious on clinical infection Incurvated medial margin right hallux toenail associated callused nail groove  Plan: Debrided toenails 6-10 without bleeding Debride medial margin right hallux toenail Start patient to return if she has persistent pain in the medial margin right hallux toenail or in the surgical site in the left hallux toenail  Reappoint at patient's request

## 2017-02-05 NOTE — Patient Instructions (Signed)
The left great toenail has low-grade redness and crusting around the surgical site. Often, this is associated with the reaction to the chemical apply to prevent the nail regrowing. If you notice persistent pain, swelling, drainage contact your office Also, I trimmed out the incurvated margin of the right great toenail. Okay to apply Vaseline to this area. When the nail regrows and he continues to cause discomfort I will recommend permanent removal of the margin  Reappoint at your request

## 2017-02-06 ENCOUNTER — Other Ambulatory Visit (HOSPITAL_COMMUNITY): Payer: Self-pay | Admitting: *Deleted

## 2017-02-08 ENCOUNTER — Encounter (HOSPITAL_COMMUNITY): Payer: Medicare Other | Attending: Oncology

## 2017-02-08 ENCOUNTER — Encounter (HOSPITAL_COMMUNITY): Payer: Medicare Other

## 2017-02-08 VITALS — BP 148/83 | HR 55 | Temp 97.7°F | Resp 20

## 2017-02-08 DIAGNOSIS — M8588 Other specified disorders of bone density and structure, other site: Secondary | ICD-10-CM

## 2017-02-08 DIAGNOSIS — Z853 Personal history of malignant neoplasm of breast: Secondary | ICD-10-CM

## 2017-02-08 DIAGNOSIS — C50912 Malignant neoplasm of unspecified site of left female breast: Secondary | ICD-10-CM | POA: Diagnosis present

## 2017-02-08 DIAGNOSIS — Z170 Estrogen receptor positive status [ER+]: Secondary | ICD-10-CM

## 2017-02-08 DIAGNOSIS — M8580 Other specified disorders of bone density and structure, unspecified site: Secondary | ICD-10-CM

## 2017-02-08 LAB — CBC WITH DIFFERENTIAL/PLATELET
BASOS ABS: 0 10*3/uL (ref 0.0–0.1)
BASOS PCT: 0 %
Eosinophils Absolute: 0.2 10*3/uL (ref 0.0–0.7)
Eosinophils Relative: 3 %
HCT: 41.5 % (ref 36.0–46.0)
Hemoglobin: 13.9 g/dL (ref 12.0–15.0)
LYMPHS PCT: 34 %
Lymphs Abs: 2.1 10*3/uL (ref 0.7–4.0)
MCH: 31.7 pg (ref 26.0–34.0)
MCHC: 33.5 g/dL (ref 30.0–36.0)
MCV: 94.5 fL (ref 78.0–100.0)
MONO ABS: 0.6 10*3/uL (ref 0.1–1.0)
Monocytes Relative: 9 %
NEUTROS ABS: 3.5 10*3/uL (ref 1.7–7.7)
NEUTROS PCT: 54 %
Platelets: 205 10*3/uL (ref 150–400)
RBC: 4.39 MIL/uL (ref 3.87–5.11)
RDW: 12.1 % (ref 11.5–15.5)
WBC: 6.4 10*3/uL (ref 4.0–10.5)

## 2017-02-08 LAB — COMPREHENSIVE METABOLIC PANEL
ALBUMIN: 4.1 g/dL (ref 3.5–5.0)
ALT: 28 U/L (ref 14–54)
AST: 29 U/L (ref 15–41)
Alkaline Phosphatase: 80 U/L (ref 38–126)
Anion gap: 7 (ref 5–15)
BILIRUBIN TOTAL: 0.6 mg/dL (ref 0.3–1.2)
BUN: 13 mg/dL (ref 6–20)
CHLORIDE: 101 mmol/L (ref 101–111)
CO2: 31 mmol/L (ref 22–32)
CREATININE: 0.62 mg/dL (ref 0.44–1.00)
Calcium: 9.7 mg/dL (ref 8.9–10.3)
GFR calc Af Amer: >60 mL/min (ref >60)
GFR calc non Af Amer: >60 mL/min (ref >60)
GLUCOSE: 103 mg/dL — AB (ref 65–99)
POTASSIUM: 4.1 mmol/L (ref 3.5–5.1)
Sodium: 139 mmol/L (ref 135–145)
TOTAL PROTEIN: 6.8 g/dL (ref 6.5–8.1)

## 2017-02-08 MED ORDER — DENOSUMAB 60 MG/ML ~~LOC~~ SOLN
60.0000 mg | Freq: Once | SUBCUTANEOUS | Status: AC
  Administered 2017-02-08: 60 mg via SUBCUTANEOUS
  Filled 2017-02-08: qty 1

## 2017-02-08 NOTE — Progress Notes (Signed)
Angela [name] presents today for injection per MD orders. Prolia 60mg  administered SQ in left Abdomen. Administration without incident. Patient tolerated well. Pt continues to take her Calcium and Vitamin D daily. No tooth extractions or root canals have been done or are scheduled to be done. Labs WNL.

## 2017-02-12 ENCOUNTER — Other Ambulatory Visit: Payer: Medicare Other | Admitting: Orthotics

## 2017-03-18 ENCOUNTER — Other Ambulatory Visit: Payer: Self-pay | Admitting: Family Medicine

## 2017-03-22 ENCOUNTER — Encounter: Payer: Self-pay | Admitting: Family Medicine

## 2017-03-22 ENCOUNTER — Ambulatory Visit (INDEPENDENT_AMBULATORY_CARE_PROVIDER_SITE_OTHER): Payer: Medicare Other | Admitting: Family Medicine

## 2017-03-22 VITALS — BP 120/72 | HR 65 | Temp 97.7°F | Ht 66.75 in | Wt 171.8 lb

## 2017-03-22 DIAGNOSIS — Z0000 Encounter for general adult medical examination without abnormal findings: Secondary | ICD-10-CM

## 2017-03-22 DIAGNOSIS — I10 Essential (primary) hypertension: Secondary | ICD-10-CM | POA: Diagnosis not present

## 2017-03-22 DIAGNOSIS — E785 Hyperlipidemia, unspecified: Secondary | ICD-10-CM

## 2017-03-22 DIAGNOSIS — R739 Hyperglycemia, unspecified: Secondary | ICD-10-CM

## 2017-03-22 LAB — LIPID PANEL
CHOL/HDL RATIO: 4
CHOLESTEROL: 139 mg/dL (ref 0–200)
HDL: 35.5 mg/dL — ABNORMAL LOW (ref >39.00)
LDL CALC: 83 mg/dL (ref 0–99)
NONHDL: 103.19
Triglycerides: 99 mg/dL (ref 0.0–149.0)
VLDL: 19.8 mg/dL (ref 0.0–40.0)

## 2017-03-22 LAB — COMPREHENSIVE METABOLIC PANEL
ALBUMIN: 4.3 g/dL (ref 3.5–5.2)
ALT: 21 U/L (ref 0–35)
AST: 22 U/L (ref 0–37)
Alkaline Phosphatase: 63 U/L (ref 39–117)
BUN: 16 mg/dL (ref 6–23)
CHLORIDE: 105 meq/L (ref 96–112)
CO2: 30 meq/L (ref 19–32)
CREATININE: 0.72 mg/dL (ref 0.40–1.20)
Calcium: 9.6 mg/dL (ref 8.4–10.5)
GFR: 85.05 mL/min (ref >60.00)
GLUCOSE: 104 mg/dL — AB (ref 70–99)
POTASSIUM: 4.4 meq/L (ref 3.5–5.1)
SODIUM: 141 meq/L (ref 135–145)
Total Bilirubin: 0.5 mg/dL (ref 0.2–1.2)
Total Protein: 6.2 g/dL (ref 6.0–8.3)

## 2017-03-22 LAB — CBC
HEMATOCRIT: 39.8 % (ref 36.0–46.0)
Hemoglobin: 13.9 g/dL (ref 12.0–15.0)
MCHC: 34.9 g/dL (ref 30.0–36.0)
MCV: 92.6 fl (ref 78.0–100.0)
Platelets: 218 10*3/uL (ref 150.0–400.0)
RBC: 4.3 Mil/uL (ref 3.87–5.11)
RDW: 12.3 % (ref 11.5–15.5)
WBC: 5.9 10*3/uL (ref 4.0–10.5)

## 2017-03-22 LAB — HEMOGLOBIN A1C: Hgb A1c MFr Bld: 5.9 % (ref 4.6–6.5)

## 2017-03-22 NOTE — Patient Instructions (Addendum)
Consider shingrix- 2 shot series for shingles  Please stop by lab before you go  Also- still seeing dermatology yearly correct?

## 2017-03-22 NOTE — Progress Notes (Signed)
Phone: [phone]  Subjective:  Patient presents today for their annual physical. Chief complaint-noted.   See problem oriented charting- ROS- full  review of systems was completed and negative except for: occasional right sided chest pain but no shortness of breath with this.   The following were reviewed and entered/updated in epic: Past Medical History:  Diagnosis Date  . Arthritis    mild  . Arthritis pain   . BCC (basal cell carcinoma)   . Breast cancer ([place]) 2009  . Hyperlipidemia   . Hypertension   . Inflammation of bone ([place]) current   foot, in a boot for 6 weeks  . Macular retinal cyst of right eye   . OSA (obstructive sleep apnea) 08/24/2015  . Osteopenia   . Tachycardia    Patient Active Problem List   Diagnosis Date Noted  . Breast cancer ([place]) 03/20/2011    Priority: High  . OSA (obstructive sleep apnea) 08/24/2015    Priority: Medium  . Hyperglycemia 11/12/2014    Priority: Medium  . Osteopenia determined by x-ray 06/04/2014    Priority: Medium  . HTN (hypertension) 07/13/2008    Priority: Medium  . Hyperlipidemia 07/09/2007    Priority: Medium  . History of skin cancer 02/21/2016    Priority: Low  . Chest discomfort 10/05/2014    Priority: Low  . 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 Vitamin D deficiency 07/09/2007    Priority: Low  . Chest pain 12/21/2011   Past Surgical History:  Procedure Laterality Date  . bcc equired excision    . BREAST LUMPECTOMY  10/09   breast cancer, left  . BREAST LUMPECTOMY    . [facility]  . MASS EXCISION  7/09   appendix  . TONSILLECTOMY      Family History  Problem Relation Age of Onset  . Heart attack Mother   . Hypertension Mother   . Heart disease Father   . Heart attack Father   . Hypertension Father   . Colon cancer Neg Hx   . Stroke Neg Hx     Medications- reviewed and updated Current Outpatient Prescriptions  Medication Sig Dispense Refill   . aspirin 81 MG tablet Take 81 mg by mouth every evening.     . Calcium Carbonate-Vitamin D (CALTRATE 600+D) 600-400 MG-UNIT per tablet Take 2 tablets by mouth every evening.     . Cholecalciferol (D 2000) 2000 UNITS TABS Take 1 tablet by mouth every evening.    Marland Kitchen co-enzyme Q-10 30 MG capsule Take 400 mg by mouth every evening.     . denosumab (PROLIA) 60 MG/ML SOLN injection Inject 60 mg into the skin every 6 (six) months.     . fish oil-omega-3 fatty acids 1000 MG capsule Take 1 g by mouth every evening.     . hydrochlorothiazide (HYDRODIURIL) 25 MG tablet take 1 tablet by mouth once daily 90 tablet 3  . metoprolol succinate (TOPROL-XL) 50 MG 24 hr tablet take 1 tablet by mouth once daily with food OR FOLLOWING A MEAL 90 tablet 1  . Multiple Vitamin (MULTIVITAMIN) tablet Take 1 tablet by mouth daily.      . nitroGLYCERIN (NITROSTAT) 0.4 MG SL tablet Place 1 tablet (0.4 mg total) under the tongue every 5 (five) minutes as needed for chest pain. 25 tablet 6  . Potassium Chloride ER 20 MEQ TBCR Take 20 mEq by mouth daily. 30 tablet 11  . simvastatin (  ZOCOR) 20 MG tablet take 1 tablet by mouth at bedtime 90 tablet 2  . vitamin C (ASCORBIC ACID) 500 MG tablet Take 500 mg by mouth every evening.      No current facility-administered medications for this visit.     Allergies-reviewed and updated Allergies  Allergen Reactions  . Lisinopril Cough  . Statins Other (See Comments)    Myalgia with lipitor- unproven Myalgia with lipitor- unproven She is able to tolerate simvastatin    Social History   Social History  . Marital status: Married    Spouse name: N/A  . Number of children: 3  . Years of education: N/A   Occupational History  . retired Retired    works 1 day a week   Social History Main Topics  . Smoking status: Never Smoker  . Smokeless tobacco: Never Used  . Alcohol use 1.8 oz/week    3 Glasses of wine per week  . Drug use: No  . Sexual activity: Not Asked   Other  Topics Concern  . None   Social History Narrative   Married. 2 daughters. 2 grandkids.       Retired from Materials engineer ed mostly [place]: travel, time with friends, eating out, lots of grandkids time in [place]    Objective: BP 120/72 (BP Location: Left Arm, Patient Position: Sitting, Cuff Size: Large)   Pulse 65   Temp 97.7 F (36.5 C) (Oral)   Ht 5' 6.75" (1.695 m)   Wt 171 lb 12.8 oz (77.9 kg)   SpO2 96%   BMI 27.11 kg/m  Gen: NAD, resting comfortably HEENT: Mucous membranes are moist. Oropharynx normal Neck: no thyromegaly CV: RRR no murmurs rubs or gallops Lungs: CTAB no crackles, wheeze, rhonchi Abdomen: soft/nontender/nondistended/normal bowel sounds. No rebound or guarding.  Ext: no edema Skin: warm, dry Neuro: grossly normal, moves all extremities, PERRLA  Assessment/Plan:  71 y.o. female presenting for annual physical.  Health Maintenance counseling: 1. Anticipatory guidance: Patient counseled regarding regular dental exams q6 months, eye exams q6 months, wearing seatbelts.  2. Risk factor reduction:  Advised patient of need for regular exercise and diet rich and fruits and vegetables to reduce risk of heart attack and stroke. Exercise- walking 2-3 days a week for most part when grandkids not here. Diet-some indiscretions being around grandkids and helping with new grandbaby- discussed 5-10 lbs off.  Wt Readings from Last 3 Encounters:  03/22/17 171 lb 12.8 oz (77.9 kg)  01/16/17 170 lb (77.1 kg)  01/14/17 170 lb 12.8 oz (77.5 kg)  3. Immunizations/screenings/ancillary studies- discussed shingrix - may get at pharmacy or [facility].  Immunization History  Administered Date(s) Administered  . Hep A / Hep B 03/16/2013, 10/06/2013  . Hepatitis B, adult 04/17/2013  . Influenza Split 06/09/2012  . Influenza Whole 05/22/2011  . Influenza,inj,Quad PF,36+ Mos 05/27/2013  . Influenza-Unspecified 06/10/2014, 05/24/2015, 04/24/2016  . Pneumococcal Conjugate-13  11/12/2014  . Pneumococcal Polysaccharide-23 08/24/2008, 07/17/2013  . Td 04/19/2010  . Tdap 04/05/2016  . Zoster 11/17/2008  4. Cervical cancer screening- aged out, no history abnormal pap 5. Breast cancer history (2009 lumpectomy, radiation, tamoxifen- following with Dr. Marlou Starks for 10 years. Has been on Prolia through hematology from [place] at [facility])-  breast exam with general surgery and mammogram 07/09/16 6. Colon cancer screening - 02/13/2008 with 10 year repeat- will be next year.  7. Skin cancer screening- dermatology yearly- history of basal cell 8. Osteopenia on prolia- started  3-4 years ago. Has bone density later this year and we will follow up on that next visit  Status of chronic or acute concerns  HTN- controlled on hctz 25mg  and metoprolol 50mg  XL.  HLD- LDL <100 on last check on simvastatin 20mg - update today  Hyperglycemia- update CBG. Last a1c was not elevated. Have instructed her on weight loss. 165 last year, now up to 171. Encouraged focus on 5-10 lbs down over next year.   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 Zadie Rhine who saw retinal schisis as well. R eye 20/60- together eyes are ok. He has seen some improvement while she has been on CPAP- he thinks could correlate  Intermittent central or right sided chest pain- saw cardiology and had low risk stress test. Still with some issues.   Intermittent headaches but much improved- wonders if related to cpap strap. Had reassuring MRI  Considering eyelid surgery- affecting vision some and feels heaviness.   6 month check in to follow up on weight trend  Orders Placed This Encounter  Procedures  . CBC    [place]  . Comprehensive metabolic panel    [place]    Order Specific Question:   Has the patient fasted?    Answer:   No  . Lipid panel    [place]    Order Specific Question:   Has the patient fasted?    Answer:   No  . Hemoglobin A1c    [place]   Return precautions advised.  Garret Reddish, MD

## 2017-04-17 ENCOUNTER — Other Ambulatory Visit: Payer: Self-pay | Admitting: Family Medicine

## 2017-06-17 ENCOUNTER — Telehealth: Payer: Self-pay | Admitting: Family Medicine

## 2017-06-17 NOTE — Telephone Encounter (Signed)
Please schedule patient and labs needed will be discussed at visit and then drawn. Patient can be scheduled for early morning and come in fasting

## 2017-06-17 NOTE — Telephone Encounter (Signed)
Patient called in to make a 6 month f/u and would like to know if she needed to come in for labs prior to that appointment. Please call patient and advise.

## 2017-06-18 NOTE — Telephone Encounter (Signed)
Informed patient of note below. She is ok with coming in at time already set for 6 month f/u. Will be fasting.

## 2017-07-01 ENCOUNTER — Ambulatory Visit (HOSPITAL_COMMUNITY)
Admission: RE | Admit: 2017-07-01 | Discharge: 2017-07-01 | Disposition: A | Discharge status: Home / Self Care | Payer: Medicare Other | Source: Ambulatory Visit | Attending: Hematology & Oncology | Admitting: Hematology & Oncology

## 2017-07-01 DIAGNOSIS — C50912 Malignant neoplasm of unspecified site of left female breast: Secondary | ICD-10-CM | POA: Insufficient documentation

## 2017-07-01 DIAGNOSIS — M8580 Other specified disorders of bone density and structure, unspecified site: Secondary | ICD-10-CM

## 2017-07-01 DIAGNOSIS — Z170 Estrogen receptor positive status [ER+]: Secondary | ICD-10-CM | POA: Insufficient documentation

## 2017-07-01 DIAGNOSIS — M85851 Other specified disorders of bone density and structure, right thigh: Secondary | ICD-10-CM | POA: Insufficient documentation

## 2017-07-04 ENCOUNTER — Ambulatory Visit (HOSPITAL_COMMUNITY): Payer: Medicare Other

## 2017-07-04 ENCOUNTER — Other Ambulatory Visit (HOSPITAL_COMMUNITY): Payer: Medicare Other

## 2017-07-09 ENCOUNTER — Other Ambulatory Visit (HOSPITAL_COMMUNITY): Payer: Medicare Other

## 2017-07-09 ENCOUNTER — Ambulatory Visit (HOSPITAL_COMMUNITY): Payer: Medicare Other

## 2017-07-09 ENCOUNTER — Ambulatory Visit (HOSPITAL_COMMUNITY): Payer: Medicare Other | Admitting: Adult Health

## 2017-07-10 ENCOUNTER — Ambulatory Visit: Payer: Medicare Other

## 2017-07-16 ENCOUNTER — Ambulatory Visit: Payer: Medicare Other

## 2017-07-17 ENCOUNTER — Ambulatory Visit
Admission: RE | Admit: 2017-07-17 | Discharge: 2017-07-17 | Disposition: A | Discharge status: Home / Self Care | Payer: Medicare Other | Source: Ambulatory Visit | Attending: Family Medicine | Admitting: Family Medicine

## 2017-07-17 DIAGNOSIS — Z1231 Encounter for screening mammogram for malignant neoplasm of breast: Secondary | ICD-10-CM

## 2017-07-17 HISTORY — DX: Personal history of irradiation: Z92.3

## 2017-08-10 ENCOUNTER — Other Ambulatory Visit: Payer: Self-pay | Admitting: Nurse Practitioner

## 2017-08-13 ENCOUNTER — Ambulatory Visit (HOSPITAL_COMMUNITY): Payer: Medicare Other | Admitting: Adult Health

## 2017-08-13 ENCOUNTER — Ambulatory Visit (HOSPITAL_COMMUNITY): Payer: Medicare Other

## 2017-08-13 ENCOUNTER — Other Ambulatory Visit (HOSPITAL_COMMUNITY): Payer: Medicare Other

## 2017-08-20 ENCOUNTER — Other Ambulatory Visit (HOSPITAL_COMMUNITY): Payer: Self-pay | Admitting: *Deleted

## 2017-08-20 DIAGNOSIS — Z170 Estrogen receptor positive status [ER+]: Principal | ICD-10-CM

## 2017-08-20 DIAGNOSIS — C50912 Malignant neoplasm of unspecified site of left female breast: Secondary | ICD-10-CM

## 2017-08-20 NOTE — Progress Notes (Signed)
[place], [place] [address]   CLINIC:  Medical Oncology/Hematology  PCP:  Marin Olp, MD [address] [place] [address] [phone]   REASON FOR VISIT:  Follow-up for Stage I invasive lobular carcinoma of (L) breast; ER+ AND Osteopenia  CURRENT THERAPY: Observation AND Prolia inj every 6 months.     HISTORY OF PRESENT ILLNESS:  (From Dr. Donald Pore note on 07/06/16)     INTERVAL HISTORY:  [name] 71 y.o. female returns for routine follow-up for left breast cancer and osteopenia.   Here today with her husband.   Overall, she tells me she has been feeling very well. Appetite and energy levels are both 100%.  Denies any new breast concerns. She completed anti-estrogen therapy in 2014.  Mammogram is up-to-date and was negative for malignancy.  She had DEXA scan recently; she would like to review those results. She also had labs done today and would like to review those results as well, as she is concerned about her blood sugar.  Her only complaint is mild bilateral feet pain at night that feels like a tingling/burning sensation at times.  She has a podiatrist she has seen in the past; encouraged her to contact them or her PCP if her symptoms continue/worsen.   Her PCP is Dr. Yong Channel in [place]; she sees him regularly.     REVIEW OF SYSTEMS:  Review of Systems  Constitutional: Negative.  Negative for chills, fatigue and fever.  HENT:  Negative.  Negative for lump/mass and nosebleeds.   Eyes: Negative.   Respiratory: Negative.  Negative for cough and shortness of breath.   Cardiovascular: Negative.  Negative for chest pain and leg swelling.  Gastrointestinal: Negative.  Negative for abdominal pain, blood in stool, constipation, diarrhea, nausea and vomiting.  Endocrine: Negative.   Genitourinary: Negative.  Negative for dysuria and hematuria.   Musculoskeletal: Positive for arthralgias (bilat foot pain at times ).    Skin: Negative.  Negative for rash.  Neurological: Negative.  Negative for dizziness and headaches.       Bilat feet tingling at bedtime   Hematological: Negative.  Negative for adenopathy. Does not bruise/bleed easily.  Psychiatric/Behavioral: Negative.  Negative for depression and sleep disturbance. The patient is not nervous/anxious.      PAST MEDICAL/SURGICAL HISTORY:  Past Medical History:  Diagnosis Date  . Arthritis    mild  . Arthritis pain   . BCC (basal cell carcinoma)   . Breast cancer ([facility]) 2009  . Hyperlipidemia   . Hypertension   . Inflammation of bone ([facility]) current   foot, in a boot for 6 weeks  . Macular retinal cyst of right eye   . OSA (obstructive sleep apnea) 08/24/2015  . Osteopenia   . Personal history of radiation therapy   . Tachycardia    Past Surgical History:  Procedure Laterality Date  . bcc equired excision    . BREAST EXCISIONAL BIOPSY    . BREAST LUMPECTOMY  10/09   breast cancer, left  . BREAST LUMPECTOMY    . CATARACT EXTRACTION  [facility]  . MASS EXCISION  7/09   appendix  . TONSILLECTOMY       SOCIAL HISTORY:  Social History   Socioeconomic History  . Marital status: Married    Spouse name: Not on file  . Number of children: 3  . Years of education: Not on file  . Highest education level: Not  on file  Social Needs  . Financial resource strain: Not on file  . Food insecurity - worry: Not on file  . Food insecurity - inability: Not on file  . Transportation needs - medical: Not on file  . Transportation needs - non-medical: Not on file  Occupational History  . Occupation: retired    Fish farm manager: RETIRED    Comment: works 1 day a week  Tobacco Use  . Smoking status: Never Smoker  . Smokeless tobacco: Never Used  Substance and Sexual Activity  . Alcohol use: Yes    Alcohol/week: 1.8 oz    Types: 3 Glasses of wine per week  . Drug use: No  . Sexual activity: Not on file  Other Topics Concern  . Not on file   Social History Narrative   Married. 2 daughters. 2 grandkids.       Retired from Materials engineer ed mostly [place]: travel, time with friends, eating out, lots of grandkids time in [place]:  Family History  Problem Relation Age of Onset  . Heart attack Mother   . Hypertension Mother   . Heart disease Father   . Heart attack Father   . Hypertension Father   . Colon cancer Neg Hx   . Stroke Neg Hx     CURRENT MEDICATIONS:  Outpatient Encounter Medications as of 08/21/2017  Medication Sig Note  . aspirin 81 MG tablet Take 81 mg by mouth every evening.    . Calcium Carbonate-Vitamin D (CALTRATE 600+D) 600-400 MG-UNIT per tablet Take 2 tablets by mouth every evening.    . Cholecalciferol (D 2000) 2000 UNITS TABS Take 1 tablet by mouth every evening.   Marland Kitchen co-enzyme Q-10 30 MG capsule Take 400 mg by mouth every evening.    . denosumab (PROLIA) 60 MG/ML SOLN injection Inject 60 mg into the skin every 6 (six) months.  12/10/2016: Due 01/2017  . fish oil-omega-3 fatty acids 1000 MG capsule Take 1 g by mouth every evening.    . hydrochlorothiazide (HYDRODIURIL) 25 MG tablet take 1 tablet by mouth once daily   . metoprolol succinate (TOPROL-XL) 50 MG 24 hr tablet take 1 tablet by mouth once daily with food OR FOLLOWING A MEAL   . Multiple Vitamin (MULTIVITAMIN) tablet Take 1 tablet by mouth daily.     . Potassium Chloride ER 20 MEQ TBCR Take 20 mEq by mouth daily.   . simvastatin (ZOCOR) 20 MG tablet take 1 tablet by mouth at bedtime   . vitamin C (ASCORBIC ACID) 500 MG tablet Take 500 mg by mouth every evening.    . nitroGLYCERIN (NITROSTAT) 0.4 MG SL tablet Place 1 tablet (0.4 mg total) under the tongue every 5 (five) minutes as needed for chest pain.   . [DISCONTINUED] metoprolol succinate (TOPROL-XL) 50 MG 24 hr tablet take 1 tablet by mouth once daily with food   . [EXPIRED] denosumab (PROLIA) injection 60 mg     No facility-administered encounter medications on  file as of 08/21/2017.     ALLERGIES:  Allergies  Allergen Reactions  . Lisinopril Cough  . Statins Other (See Comments)    Myalgia with lipitor- unproven Myalgia with lipitor- unproven She is able to tolerate simvastatin     PHYSICAL EXAM:  ECOG Performance status: 0 - Asymptomatic   Vitals:   08/21/17 1304  BP: (!) 155/84  Pulse: 62  Resp: 16  SpO2: 97%   Filed Weights  08/21/17 1304  Weight: 170 lb (77.1 kg)    Physical Exam  Constitutional: She is oriented to person, place, and time and well-developed, well-nourished, and in no distress.  HENT:  Head: Normocephalic.  Mouth/Throat: Oropharynx is clear and moist. No oropharyngeal exudate.  Eyes: Conjunctivae are normal. Pupils are equal, round, and reactive to light. No scleral icterus.  Neck: Normal range of motion. Neck supple.  Cardiovascular: Normal rate and regular rhythm.  Pulmonary/Chest: Effort normal and breath sounds normal. No respiratory distress. She has no wheezes.    Abdominal: Soft. Bowel sounds are normal. There is no tenderness.  Musculoskeletal: Normal range of motion. She exhibits no edema.  Lymphadenopathy:    She has no cervical adenopathy.       Right: No supraclavicular adenopathy present.       Left: No supraclavicular adenopathy present.  Neurological: She is alert and oriented to person, place, and time. No cranial nerve deficit. Gait normal.  Skin: Skin is warm and dry. No rash noted.  Psychiatric: Mood, memory, affect and judgment normal.  Nursing note and vitals reviewed.    LABORATORY DATA:  I have reviewed the labs as listed.  CBC    Component Value Date/Time   WBC 6.6 08/21/2017 1206   RBC 4.51 08/21/2017 1206   HGB 14.2 08/21/2017 1206   HCT 43.4 08/21/2017 1206   PLT 234 08/21/2017 1206   MCV 96.2 08/21/2017 1206   MCH 31.5 08/21/2017 1206   MCHC 32.7 08/21/2017 1206   RDW 12.1 08/21/2017 1206   LYMPHSABS 2.2 08/21/2017 1206   MONOABS 0.7 08/21/2017 1206    EOSABS 0.1 08/21/2017 1206   BASOSABS 0.0 08/21/2017 1206   CMP Latest Ref Rng & Units 08/21/2017 03/22/2017 02/08/2017  Glucose 65 - 99 mg/dL 97 104(H) 103(H)  BUN 6 - 20 mg/dL 16 16 13   Creatinine 0.44 - 1.00 mg/dL 0.65 0.72 0.62  Sodium 135 - 145 mmol/L 137 141 139  Potassium 3.5 - 5.1 mmol/L 3.9 4.4 4.1  Chloride 101 - 111 mmol/L 102 105 101  CO2 22 - 32 mmol/L 27 30 31   Calcium 8.9 - 10.3 mg/dL 10.1 9.6 9.7  Total Protein 6.5 - 8.1 g/dL 7.3 6.2 6.8  Total Bilirubin 0.3 - 1.2 mg/dL 0.6 0.5 0.6  Alkaline Phos 38 - 126 U/L 67 63 80  AST 15 - 41 U/L 30 22 29   ALT 14 - 54 U/L 28 21 28     PENDING LABS:    DIAGNOSTIC IMAGING:  *The following radiologic images and reports have been reviewed independently and agree with below findings.  Last mammogram: 07/17/17 CLINICAL DATA:  Screening.  EXAM: 2D DIGITAL SCREENING BILATERAL MAMMOGRAM WITH CAD AND ADJUNCT TOMO  COMPARISON:  Previous exam(s).  ACR Breast Density Category b: There are scattered areas of fibroglandular density.  FINDINGS: There are no findings suspicious for malignancy. Stable postsurgical changes on the left. Images were processed with CAD.  IMPRESSION: No mammographic evidence of malignancy. A result letter of this screening mammogram will be mailed directly to the patient.  RECOMMENDATION: Screening mammogram in one year. (Code:SM-B-01Y)  BI-RADS CATEGORY  2: Benign.   Electronically Signed   By: Lajean Manes M.D.   On: 07/18/2017 09:02   DEXA: 07/01/17 EXAM: DUAL X-RAY ABSORPTIOMETRY (DXA) FOR BONE MINERAL DENSITY  IMPRESSION: Ordering Physician:  Dr. Patrici Ranks,  Your patient Hansini Clodfelter completed a BMD test on 07/01/2017 using the [place] (software version: 14.10) manufactured by [employer]  [employer]s [employer]. The following summarizes the results of our evaluation. PATIENT BIOGRAPHICAL: Name: MERCIE, BALSLEY Patient ID: [id number] Birth Date: November 14, 1945  Height: 65.5 in. Gender: Female Exam Date: 07/01/2017 Weight: 171.0 lbs. Indications: Caucasian, Follow up Osteopenia, Height Loss, Hx Breast Ca, Post Menopausal Fractures: Treatments: Asprin, Calcium, Multivitamin, Prolia, Vitamin D DENSITOMETRY RESULTS: Site      Region     Measured Date Measured Age WHO Classification Young Adult T-score BMD         %Change vs. Previous Significant Change (*) AP Spine L1-L3 07/01/2017 70.5 Normal -0.9 1.059 g/cm2 3.2% Yes AP Spine L1-L3 07/01/2015 68.5 Osteopenia -1.2 1.026 g/cm2 7.5% Yes AP Spine L1-L3 06/29/2013 66.5 Osteopenia -1.8 0.954 g/cm2 - -  DualFemur Neck Right 07/01/2017 70.5 Osteopenia -1.6 0.820 g/cm2 -0.8% - DualFemur Neck Right 07/01/2015 68.5 Osteopenia -1.5 0.827 g/cm2 -0.7% - DualFemur Neck Right 06/29/2013 66.5 Osteopenia -1.5 0.833 g/cm2 - - ASSESSMENT: BMD as determined from Femur Neck Right is 0.820 g/cm2 with a T-Score of -1.6. This patient is considered osteopenic according to [facility]) criteria. Compared with the prior study on 07/01/2015, the BMD of the lumbar spine shows a statistically significant increase. 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 Treated with lumpectomy, followed by adjuvant radiation therapy. Completed anti-estrogen treatment with Arimidex in 08/2013.  -Clinical breast exam performed today and benign.  -Last mammogram on 07/17/17 negative. Will be due for annual screening mammogram in 07/2018; orders placed today.  -Return to cancer center in 1 year for  follow-up visit with labs.    Bone health:  -Last DEXA scan on 07/01/17 showed osteopenia with T-score -1.6 (which was improvement from previous DEXA scan). Results reviewed with patient today.  -Continue Prolia inj every 6 months.  She is due for injection today; serum calcium reviewed and is adequate for injection.  Oncology Flowsheet 02/08/2017  denosumab (PROLIA) [place] 60 mg  -Recommend continued calcium/vitamin D supplementation and increase weight-bearing exercises as tolerated.    Health maintenance/Wellness promotion:  -Her blood sugar was normal today; encouraged her to continue to follow-up with PCP for closer monitoring of her blood glucose as directed.  -Encouraged healthy diet and exercise. Breast cancer survivors who exercise regularly have lower risk of cancer recurrence. She lives in [place]; we talked about the [place] program available through [facility]. Encouraged her to check with her local [employer] to see if they participate in this free fitness program for cancer survivors.  -Recommended continued follow-up with PCP for health maintenance and other age/gender appropriate cancer screenings and immunizations.          Dispo:  -Continue Prolia inj every 6 months.  -Mammogram in 07/2018; orders placed today.  -Return to cancer center in 1 year for follow-up with labs and subsequent Prolia injection.    All questions were answered to patient's stated satisfaction. Encouraged patient to call with any new concerns or questions before her next visit to the cancer center and we can certain see her sooner, if needed.    Plan of care  discussed with Dr. Talbert Cage, who agrees with the above aforementioned.    Orders placed this encounter:  Orders Placed This Encounter  Procedures  . MM SCREENING BREAST TOMO BILATERAL  . MM SCREENING BREAST TOMO BILATERAL  . CBC with Differential/Platelet  . Comprehensive metabolic panel      Mike Craze, NP [place] [phone]

## 2017-08-21 ENCOUNTER — Encounter (HOSPITAL_COMMUNITY): Payer: Self-pay

## 2017-08-21 ENCOUNTER — Encounter (HOSPITAL_COMMUNITY): Payer: Medicare Other | Attending: Oncology

## 2017-08-21 ENCOUNTER — Encounter (HOSPITAL_COMMUNITY): Payer: Medicare Other

## 2017-08-21 ENCOUNTER — Encounter (HOSPITAL_COMMUNITY): Payer: Self-pay | Admitting: Adult Health

## 2017-08-21 ENCOUNTER — Encounter (HOSPITAL_COMMUNITY): Payer: Medicare Other | Attending: Oncology | Admitting: Adult Health

## 2017-08-21 ENCOUNTER — Other Ambulatory Visit: Payer: Self-pay

## 2017-08-21 VITALS — BP 155/84 | HR 62 | Resp 16 | Ht 66.75 in | Wt 170.0 lb

## 2017-08-21 DIAGNOSIS — C50912 Malignant neoplasm of unspecified site of left female breast: Secondary | ICD-10-CM | POA: Diagnosis present

## 2017-08-21 DIAGNOSIS — M8580 Other specified disorders of bone density and structure, unspecified site: Secondary | ICD-10-CM

## 2017-08-21 DIAGNOSIS — Z170 Estrogen receptor positive status [ER+]: Secondary | ICD-10-CM | POA: Diagnosis not present

## 2017-08-21 DIAGNOSIS — Z1231 Encounter for screening mammogram for malignant neoplasm of breast: Secondary | ICD-10-CM

## 2017-08-21 DIAGNOSIS — C50919 Malignant neoplasm of unspecified site of unspecified female breast: Secondary | ICD-10-CM

## 2017-08-21 LAB — CBC WITH DIFFERENTIAL/PLATELET
BASOS ABS: 0 10*3/uL (ref 0.0–0.1)
BASOS PCT: 0 %
Eosinophils Absolute: 0.1 10*3/uL (ref 0.0–0.7)
Eosinophils Relative: 2 %
HEMATOCRIT: 43.4 % (ref 36.0–46.0)
Hemoglobin: 14.2 g/dL (ref 12.0–15.0)
Lymphocytes Relative: 34 %
Lymphs Abs: 2.2 10*3/uL (ref 0.7–4.0)
MCH: 31.5 pg (ref 26.0–34.0)
MCHC: 32.7 g/dL (ref 30.0–36.0)
MCV: 96.2 fL (ref 78.0–100.0)
MONO ABS: 0.7 10*3/uL (ref 0.1–1.0)
Monocytes Relative: 11 %
NEUTROS ABS: 3.5 10*3/uL (ref 1.7–7.7)
Neutrophils Relative %: 53 %
PLATELETS: 234 10*3/uL (ref 150–400)
RBC: 4.51 MIL/uL (ref 3.87–5.11)
RDW: 12.1 % (ref 11.5–15.5)
WBC: 6.6 10*3/uL (ref 4.0–10.5)

## 2017-08-21 LAB — COMPREHENSIVE METABOLIC PANEL
ALBUMIN: 4.4 g/dL (ref 3.5–5.0)
ALT: 28 U/L (ref 14–54)
AST: 30 U/L (ref 15–41)
Alkaline Phosphatase: 67 U/L (ref 38–126)
Anion gap: 8 (ref 5–15)
BUN: 16 mg/dL (ref 6–20)
CHLORIDE: 102 mmol/L (ref 101–111)
CO2: 27 mmol/L (ref 22–32)
Calcium: 10.1 mg/dL (ref 8.9–10.3)
Creatinine, Ser: 0.65 mg/dL (ref 0.44–1.00)
GFR calc Af Amer: >60 mL/min (ref >60)
GFR calc non Af Amer: >60 mL/min (ref >60)
GLUCOSE: 97 mg/dL (ref 65–99)
POTASSIUM: 3.9 mmol/L (ref 3.5–5.1)
Sodium: 137 mmol/L (ref 135–145)
Total Bilirubin: 0.6 mg/dL (ref 0.3–1.2)
Total Protein: 7.3 g/dL (ref 6.5–8.1)

## 2017-08-21 MED ORDER — DENOSUMAB 60 MG/ML ~~LOC~~ SOLN
60.0000 mg | Freq: Once | SUBCUTANEOUS | Status: AC
  Administered 2017-08-21: 60 mg via SUBCUTANEOUS
  Filled 2017-08-21: qty 1

## 2017-08-21 NOTE — Patient Instructions (Signed)
[facility] Discharge Instructions  RECOMMENDATIONS MADE BY THE CONSULTANT AND ANY TEST RESULTS WILL BE SENT TO YOUR REFERRING PHYSICIAN.  You were seen today by Mike Craze NP. Continue Prolia every 6 months. Your mammogram is due in November of 2019. Return in 1 year for labs, Prolia and follow up.   Thank you for choosing [facility] at [facility] to provide your oncology and hematology care.  To afford each patient quality time with our provider, please arrive at least 15 minutes before your scheduled appointment time.    If you have a lab appointment with the [facility] please come in thru the  Main Entrance and check in at the main information desk  You need to re-schedule your appointment should you arrive 10 or more minutes late.  We strive to give you quality time with our providers, and arriving late affects you and other patients whose appointments are after yours.  Also, if you no show three or more times for appointments you may be dismissed from the clinic at the providers discretion.     Again, thank you for choosing [facility].  Our hope is that these requests will decrease the amount of time that you wait before being seen by our physicians.       _____________________________________________________________  Should you have questions after your visit to [facility], please contact our office at [phone] between the hours of 8:30 a.m. and 4:30 p.m.  Voicemails left after 4:30 p.m. will not be returned until the following business day.  For prescription refill requests, have your pharmacy contact our office.       Resources For Cancer Patients and their Caregivers ? American Cancer Society: Can assist with transportation, wigs, general needs, runs Look Good Feel Better.        573-588-7312 ? Cancer Care: Provides financial assistance, online support groups,  medication/co-pay assistance.  1-800-813-HOPE 563-408-6198) ? [facility] Assists [place] cancer patients and their families through emotional , educational and financial support.  579-586-6196 ? [place] DSS Where to apply for food stamps, Medicaid and utility assistance. (253)033-1720 ? RCATS: Transportation to medical appointments. (364) 585-2646 ? Social Security Administration: May apply for disability if have a Stage IV cancer. 6057138359 418-037-1898 ? [employer], Disability and Transit Services: Assists with nutrition, care and transit needs. [place] Support Programs: @10RELATIVEDAYS @ > Cancer Support Group  2nd Tuesday of the month 1pm-2pm, Journey Room  > Creative Journey  3rd Tuesday of the month 1130am-1pm, Journey Room  > Look Good Feel Better  1st Wednesday of the month 10am-12 noon, Journey Room (Call [facility] [place] to register [phone])

## 2017-08-21 NOTE — Progress Notes (Signed)
[name] tolerated Prolia injection well without complaints or incident, Calcium 10.1 today and pt denied any tooth,jaw or leg pain as well as no recent or future dental visits prior to administering this medication. Pt discharged self ambulatory in satisfactory condition accompanied  By her husband

## 2017-08-21 NOTE — Patient Instructions (Signed)
[facility] at [facility] Discharge Instructions  RECOMMENDATIONS MADE BY THE CONSULTANT AND ANY TEST RESULTS WILL BE SENT TO YOUR REFERRING PHYSICIAN.  Received Prolia injection today. Follow-up as scheduled. Call clinic for any questions or concerns  Thank you for choosing [facility] at [facility] to provide your oncology and hematology care.  To afford each patient quality time with our provider, please arrive at least 15 minutes before your scheduled appointment time.    If you have a lab appointment with the Cancer Center please come in thru the  Main Entrance and check in at the main information desk  You need to re-schedule your appointment should you arrive 10 or more minutes late.  We strive to give you quality time with our providers, and arriving late affects you and other patients whose appointments are after yours.  Also, if you no show three or more times for appointments you may be dismissed from the clinic at the providers discretion.     Again, thank you for choosing [facility].  Our hope is that these requests will decrease the amount of time that you wait before being seen by our physicians.       _____________________________________________________________  Should you have questions after your visit to  Cancer Center, please contact our office at [phone] between the hours of 8:30 a.m. and 4:30 p.m.  Voicemails left after 4:30 p.m. will not be returned until the following business day.  For prescription refill requests, have your pharmacy contact our office.       Resources For Cancer Patients and their Caregivers ? American Cancer Society: Can assist with transportation, wigs, general needs, runs Look Good Feel Better.        1-888-227-6333 ? Cancer Care: Provides financial assistance, online support groups, medication/co-pay assistance.  1-800-813-HOPE (4673) ? [facility] Assists [place] cancer patients and their families through emotional , educational and financial support.  336-427-4357 ? [place] DSS Where to apply for food stamps, Medicaid and utility assistance. 336-342-1394 ? RCATS: Transportation to medical appointments. 336-347-2287 ? Social Security Administration: May apply for disability if have a Stage IV cancer. 336-342-7796 1-800-772-1213 ? [place] Aging, Disability and Transit Services: Assists with nutrition, care and transit needs. 336-349-2343  Cancer Center Support Programs: @10RELATIVEDAYS@ > Cancer Support Group  2nd Tuesday of the month 1pm-2pm, Journey Room  > Creative Journey  3rd Tuesday of the month 1130am-1pm, Journey Room  > Look Good Feel Better  1st Wednesday of the month 10am-12 noon, Journey Room (Call American Cancer Society to register 1-800-395-5775)   

## 2017-09-23 ENCOUNTER — Ambulatory Visit: Payer: Medicare Other | Admitting: Family Medicine

## 2017-10-03 ENCOUNTER — Encounter: Payer: Self-pay | Admitting: Podiatry

## 2017-10-03 ENCOUNTER — Ambulatory Visit: Payer: Medicare Other | Admitting: Podiatry

## 2017-10-03 DIAGNOSIS — M2041 Other hammer toe(s) (acquired), right foot: Secondary | ICD-10-CM | POA: Diagnosis not present

## 2017-10-03 DIAGNOSIS — B351 Tinea unguium: Secondary | ICD-10-CM | POA: Diagnosis not present

## 2017-10-03 DIAGNOSIS — M79674 Pain in right toe(s): Secondary | ICD-10-CM

## 2017-10-03 DIAGNOSIS — M79675 Pain in left toe(s): Secondary | ICD-10-CM

## 2017-10-03 NOTE — Progress Notes (Signed)
Subjective:   Patient ID: Angela [name], female   DOB: 72 y.o.   MRN: [id number]   HPI Patient presents with nail disease 1-5 both feet and also has a digital deformity of the right foot second digit.   ROS      Objective:  Physical Exam  Neurovascular status is intact with thick yellow brittle nailbeds that are painful 1-5 both feet and lesion dorsum second digit right     Assessment:  Reviewed condition and discussed mycotic nail infections with pain and also hammertoe     Plan:  Patient had debridement accomplished today along with advised on wider shoe gear and padding and reappoint as needed

## 2017-10-03 NOTE — Patient Instructions (Addendum)

## 2017-10-10 ENCOUNTER — Other Ambulatory Visit: Payer: Self-pay

## 2017-10-10 DIAGNOSIS — I10 Essential (primary) hypertension: Secondary | ICD-10-CM

## 2017-10-10 DIAGNOSIS — R079 Chest pain, unspecified: Secondary | ICD-10-CM

## 2017-10-10 MED ORDER — HYDROCHLOROTHIAZIDE 25 MG PO TABS: 25.0000 mg | ORAL_TABLET | Freq: Every day | ORAL | 3 refills | Status: DC

## 2017-10-24 ENCOUNTER — Ambulatory Visit: Payer: Medicare Other | Admitting: Family Medicine

## 2017-10-29 ENCOUNTER — Ambulatory Visit: Payer: Medicare Other | Admitting: Family Medicine

## 2017-10-29 ENCOUNTER — Encounter: Payer: Self-pay | Admitting: Family Medicine

## 2017-10-29 VITALS — BP 118/70 | HR 60 | Temp 98.2°F | Ht 66.75 in | Wt 170.2 lb

## 2017-10-29 DIAGNOSIS — I10 Essential (primary) hypertension: Secondary | ICD-10-CM | POA: Diagnosis not present

## 2017-10-29 DIAGNOSIS — R739 Hyperglycemia, unspecified: Secondary | ICD-10-CM

## 2017-10-29 DIAGNOSIS — E785 Hyperlipidemia, unspecified: Secondary | ICD-10-CM

## 2017-10-29 LAB — POCT GLYCOSYLATED HEMOGLOBIN (HGB A1C): HEMOGLOBIN A1C: 5.5

## 2017-10-29 NOTE — Assessment & Plan Note (Signed)
S: well controlled on simvastatin 20mg  with LDL under 100. No myalgias.  Lab Results  Component Value Date   CHOL 139 03/22/2017   HDL 35.50 (L) 03/22/2017   LDLCALC 83 03/22/2017   LDLDIRECT 170.3 07/09/2013   TRIG 99.0 03/22/2017   CHOLHDL 4 03/22/2017   A/P: continue current medications

## 2017-10-29 NOTE — Assessment & Plan Note (Signed)
S: controlled on hctz 25mg  and metoprolol 50mg  XR. Has to take potassium 20 meq daily with the hctz BP Readings from Last 3 Encounters:  10/29/17 118/70  08/21/17 (!) 155/84  03/22/17 120/72  A/P: We discussed blood pressure goal of <140/90. Continue current meds

## 2017-10-29 NOTE — Progress Notes (Signed)
Subjective:  Angela [name] is a 72 y.o. year old very pleasant female patient who presents for/with See problem oriented charting ROS- No chest pain or shortness of breath. No headache or blurry vision.    Past Medical History-  Patient Active Problem List   Diagnosis Date Noted  . Breast cancer ([place]) 03/20/2011    Priority: High  . OSA (obstructive sleep apnea) 08/24/2015    Priority: Medium  . Hyperglycemia 11/12/2014    Priority: Medium  . Osteopenia determined by x-ray 06/04/2014    Priority: Medium  . HTN (hypertension) 07/13/2008    Priority: Medium  . Hyperlipidemia 07/09/2007    Priority: Medium  . History of skin cancer 02/21/2016    Priority: Low  . Chest discomfort 10/05/2014    Priority: Low  . 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 Vitamin D deficiency 07/09/2007    Priority: Low  . Chest pain 12/21/2011    Medications- reviewed and updated Current Outpatient Medications  Medication Sig Dispense Refill  . aspirin 81 MG tablet Take 81 mg by mouth every evening.     . Calcium Carbonate-Vitamin D (CALTRATE 600+D) 600-400 MG-UNIT per tablet Take 2 tablets by mouth every evening.     . Cholecalciferol (D 2000) 2000 UNITS TABS Take 1 tablet by mouth every evening.    Marland Kitchen co-enzyme Q-10 30 MG capsule Take 400 mg by mouth every evening.     . denosumab (PROLIA) 60 MG/ML SOLN injection Inject 60 mg into the skin every 6 (six) months.     . fish oil-omega-3 fatty acids 1000 MG capsule Take 1 g by mouth every evening.     . hydrochlorothiazide (HYDRODIURIL) 25 MG tablet Take 1 tablet (25 mg total) by mouth daily. 90 tablet 3  . metoprolol succinate (TOPROL-XL) 50 MG 24 hr tablet take 1 tablet by mouth once daily with food OR FOLLOWING A MEAL 90 tablet 1  . Multiple Vitamin (MULTIVITAMIN) tablet Take 1 tablet by mouth daily.      . Potassium Chloride ER 20 MEQ TBCR Take 20 mEq by mouth daily. 30 tablet 11  . simvastatin (ZOCOR) 20 MG tablet take 1 tablet by  mouth at bedtime 90 tablet 2  . vitamin C (ASCORBIC ACID) 500 MG tablet Take 500 mg by mouth every evening.     . nitroGLYCERIN (NITROSTAT) 0.4 MG SL tablet Place 1 tablet (0.4 mg total) under the tongue every 5 (five) minutes as needed for chest pain. 25 tablet 6   No current facility-administered medications for this visit.     Objective: BP 118/70 (BP Location: Left Arm, Patient Position: Sitting, Cuff Size: Large)   Pulse 60   Temp 98.2 F (36.8 C) (Oral)   Ht 5' 6.75" (1.695 m)   Wt 170 lb 3.2 oz (77.2 kg)   SpO2 97%   BMI 26.86 kg/m  Gen: NAD, resting comfortably CV: RRR no murmurs rubs or gallops Lungs: CTAB no crackles, wheeze, rhonchi Abdomen: soft/nontender/nondistended/normal bowel sounds.  Ext: no edema Skin: warm, dry  Results for orders placed or performed in visit on 10/29/17 (from the past 24 hour(s))  POCT glycosylated hemoglobin (Hb A1C)     Status: None   Collection Time: 10/29/17  9:05 AM  Result Value Ref Range   Hemoglobin A1C 5.5    Assessment/Plan:  Other notes 1. Upcoming eyelid surgery. Able to complete 4 mets without chest pain or shortness of breath. I would not have issues with her  moving forward with surgery- suspect low risk cardiac wise anyway  Hyperglycemia S: Weight stable- had advised 5-10 lbs off. Watching slightly high CBGs and at risk DM a1c. Has been walking abotu 2x a week- down slightly due to the winter. Has not lost weight but difficult to do so- she is committed to continuing to work on this.  Lab Results  Component Value Date   HGBA1C 5.9 03/22/2017  A/P: Goal by physical would be 5-10 lbs of weight loss- continue regular exercise and push for 3-4x a week. Healthy eating- veggies are our best choice.   HTN (hypertension) S: controlled on hctz 25mg  and metoprolol 50mg  XR. Has to take potassium 20 meq daily with the hctz BP Readings from Last 3 Encounters:  10/29/17 118/70  08/21/17 (!) 155/84  03/22/17 120/72  A/P: We  discussed blood pressure goal of <140/90. Continue current meds  Hyperlipidemia S: well controlled on simvastatin 20mg  with LDL under 100. No myalgias.  Lab Results  Component Value Date   CHOL 139 03/22/2017   HDL 35.50 (L) 03/22/2017   LDLCALC 83 03/22/2017   LDLDIRECT 170.3 07/09/2013   TRIG 99.0 03/22/2017   CHOLHDL 4 03/22/2017   A/P: continue current medications  Future Appointments  Date Time Provider [place]  02/19/2018 12:10 PM AP-ACAPA LAB AP-ACAPA None  02/19/2018  1:00 PM AP-ACAPA INJ NURSE AP-ACAPA None  07/18/2018 11:20 AM GI-BCG MM 3 GI-BCGMM GI-BREAST CE  08/21/2018  1:30 PM AP-ACAPA LAB AP-ACAPA None  08/21/2018  2:00 PM AP-ACAPA COVERING PROVIDER AP-ACAPA None  08/21/2018  2:30 PM AP-ACAPA INJ NURSE AP-ACAPA None   Return in about 5 months (around 03/28/2018) for physical.  Lab/Order associations: Hyperglycemia - Plan: POCT glycosylated hemoglobin (Hb A1C), Hemoglobin A1c  Essential hypertension - Plan: Lipid panel, CBC, Comprehensive metabolic panel  Hyperlipidemia, unspecified hyperlipidemia type - Plan: Lipid panel, CBC, Comprehensive metabolic panel  Return precautions advised.  Garret Reddish, MD

## 2017-10-29 NOTE — Assessment & Plan Note (Signed)
S: Weight stable- had advised 5-10 lbs off. Watching slightly high CBGs and at risk DM a1c. Has been walking abotu 2x a week- down slightly due to the winter. Has not lost weight but difficult to do so- she is committed to continuing to work on this.  Lab Results  Component Value Date   HGBA1C 5.9 03/22/2017  A/P: Goal by physical would be 5-10 lbs of weight loss- continue regular exercise and push for 3-4x a week. Healthy eating- veggies are our best choice.

## 2017-10-29 NOTE — Patient Instructions (Addendum)
Goal by physical would be 5-10 lbs of weight loss- continue regular exercise and push for 3-4x a week. Healthy eating- veggies are our best choice.   Before physical- schedule a lab visit a few days before ( I ordered your labs today which is the workaround for the previsit lab issue)  Lab Results  Component Value Date   HGBA1C 5.5 10/29/2017  even though weight not down - the exercise is helping! Lets continue to work on exercise and weight loss though to be on safe side

## 2017-12-16 ENCOUNTER — Other Ambulatory Visit: Payer: Self-pay | Admitting: Family Medicine

## 2018-01-06 ENCOUNTER — Encounter: Payer: Self-pay | Admitting: Podiatry

## 2018-01-15 ENCOUNTER — Other Ambulatory Visit: Payer: Self-pay | Admitting: Family Medicine

## 2018-01-16 ENCOUNTER — Ambulatory Visit: Payer: Medicare Other | Admitting: Podiatry

## 2018-01-16 ENCOUNTER — Encounter: Payer: Self-pay | Admitting: Podiatry

## 2018-01-16 DIAGNOSIS — M79675 Pain in left toe(s): Secondary | ICD-10-CM

## 2018-01-16 DIAGNOSIS — M79674 Pain in right toe(s): Secondary | ICD-10-CM | POA: Diagnosis not present

## 2018-01-16 DIAGNOSIS — B351 Tinea unguium: Secondary | ICD-10-CM

## 2018-01-16 NOTE — Progress Notes (Signed)
Subjective:   Patient ID: Angela [name], female   DOB: 72 y.o.   MRN: [id number]   HPI Patient presents with elongated nailbeds that are painful 1-5 both feet that she cannot cut herself   ROS      Objective:  Physical Exam  Neurovascular status intact with thick yellow brittle nailbeds 1-5 both feet that do become painful     Assessment:  Mycotic nail infection with pain 1-5 both feet     Plan:  Debride painful nailbeds 1-5 both feet with no iatrogenic bleeding noted

## 2018-02-04 ENCOUNTER — Encounter: Payer: Self-pay | Admitting: Pulmonary Disease

## 2018-02-04 ENCOUNTER — Telehealth: Payer: Self-pay | Admitting: Cardiovascular Disease

## 2018-02-04 ENCOUNTER — Ambulatory Visit: Payer: Medicare Other | Admitting: Pulmonary Disease

## 2018-02-04 VITALS — BP 118/78 | HR 55 | Ht 66.0 in | Wt 167.8 lb

## 2018-02-04 DIAGNOSIS — G4733 Obstructive sleep apnea (adult) (pediatric): Secondary | ICD-10-CM

## 2018-02-04 DIAGNOSIS — Z9989 Dependence on other enabling machines and devices: Secondary | ICD-10-CM | POA: Diagnosis not present

## 2018-02-04 NOTE — Progress Notes (Signed)
[facility]  Chief Complaint  Patient presents with  . Follow-up    Pt doing well overall with cpap machine    Vital signs: BP 118/78 (BP Location: Left Arm, Cuff Size: Normal)   Pulse (!) 55   Ht 5\' 6"  (1.676 m)   Wt 167 lb 12.8 oz (76.1 kg)   SpO2 95%   BMI 27.08 kg/m   History of Present Illness: Angela [name] is a 72 y.o. female with obstructive sleep apnea.  She is doing well with CPAP.  Has nasal mask with chin strap.  Wants to try nasal pillows.  Gets marks on her nose from mask sometimes.  Physical Exam:  General - pleasant Eyes - pupils reactive ENT - no sinus tenderness, no oral exudate, no LAN Cardiac - regular, no murmur Chest - no wheeze, rales Abd - soft, non tender Ext - no edema Skin - no rashes Neuro - normal strength Psych - normal mood  Assessment/Plan:  Obstructive sleep apnea. - she is compliant with CPAP and reports benefit - continue auto CPAP  - discussed mask fit options   Patient Instructions  Follow up in 1 year    Chesley Mires, MD [place] 02/04/2018, 12:07 PM  Flow Sheet  Sleep tests: HST 08/15/15 >> AHI 52.9 SaO2 low 65% Auto CPAP 01/01/18 to 01/30/18 >> used on 30 of 30 nights with average 8 hrs 48 min.  Average AHI 0.5 with median CPAP 11 and 95 th percentile CPAP 13 cm H2O  Past Medical History: She  has a past medical history of Arthritis, Arthritis pain, BCC (basal cell carcinoma), Breast cancer ([place]) (2009), Hyperlipidemia, Hypertension, Inflammation of bone ([facility]) (current), Macular retinal cyst of right eye, OSA (obstructive sleep apnea) (08/24/2015), Osteopenia, Personal history of radiation therapy, and Tachycardia.  Past Surgical History: She  has a past surgical history that includes bcc equired excision; Tonsillectomy; Breast lumpectomy (10/09); Mass excision (7/09); Cataract extraction (2012); Breast lumpectomy; and Breast excisional biopsy.  Family  History: Her family history includes Heart attack in her father and mother; Heart disease in her father; Hypertension in her father and mother.  Social History: She  reports that she has never smoked. She has never used smokeless tobacco. She reports that she drinks about 1.8 oz of alcohol per week. She reports that she does not use drugs.  Medications: Allergies as of 02/04/2018      Reactions   Lisinopril Cough   Fish-derived Products Other (See Comments)   Myalgia with lipitor- unproven   Statins Other (See Comments)   Myalgia with lipitor- unproven Myalgia with lipitor- unproven She is able to tolerate simvastatin      Medication List        Accurate as of 02/04/18 12:07 PM. Always use your most recent med list.          aspirin 81 MG tablet Take 81 mg by mouth every evening.   CALTRATE 600+D 600-400 MG-UNIT tablet Generic drug:  Calcium Carbonate-Vitamin D Take 2 tablets by mouth every evening.   co-enzyme Q-10 30 MG capsule Take 400 mg by mouth every evening.   D 2000 2000 units Tabs Generic drug:  Cholecalciferol Take 1 tablet by mouth every evening.   fish oil-omega-3 fatty acids 1000 MG capsule Take 1 g by mouth every evening.   hydrochlorothiazide 25 MG tablet Commonly known as:  HYDRODIURIL Take 1 tablet (25 mg total) by mouth daily.   metoprolol succinate 50  MG 24 hr tablet Commonly known as:  TOPROL-XL TAKE 1 TABLET BY MOUTH ONCE DAILY WITH FOOD   multivitamin tablet Take 1 tablet by mouth daily.   nitroGLYCERIN 0.4 MG SL tablet Commonly known as:  NITROSTAT Place 1 tablet (0.4 mg total) under the tongue every 5 (five) minutes as needed for chest pain.   potassium chloride SA 20 MEQ tablet Commonly known as:  K-DUR,KLOR-CON Take 20 mEq by mouth daily.   PROLIA 60 MG/ML Soln injection Generic drug:  denosumab Inject 60 mg into the skin every 6 (six) months.   simvastatin 20 MG tablet Commonly known as:  ZOCOR TAKE 1 TABLET BY MOUTH AT  BEDTIME   vitamin C 500 MG tablet Commonly known as:  ASCORBIC ACID Take 500 mg by mouth every evening.

## 2018-02-04 NOTE — Addendum Note (Signed)
Addended by: Chesley Mires on: 02/04/2018 12:29 PM   Modules accepted: Orders

## 2018-02-04 NOTE — Patient Instructions (Signed)
Follow up in 1 year.

## 2018-02-04 NOTE — Telephone Encounter (Signed)
That is fine 

## 2018-02-04 NOTE — Telephone Encounter (Signed)
New Message   Patient is calling to request to switch from Dr. Acie Fredrickson to Dr. Domenic Polite. She indicates that its only because of the distance of travel that she wants to switch. Please advise.

## 2018-02-04 NOTE — Telephone Encounter (Signed)
This is fine with me   

## 2018-02-17 ENCOUNTER — Other Ambulatory Visit (HOSPITAL_COMMUNITY): Payer: Self-pay

## 2018-02-17 DIAGNOSIS — C50919 Malignant neoplasm of unspecified site of unspecified female breast: Secondary | ICD-10-CM

## 2018-02-17 DIAGNOSIS — M8580 Other specified disorders of bone density and structure, unspecified site: Secondary | ICD-10-CM

## 2018-02-19 ENCOUNTER — Inpatient Hospital Stay (HOSPITAL_COMMUNITY): Payer: Medicare Other | Attending: Hematology

## 2018-02-19 ENCOUNTER — Other Ambulatory Visit (HOSPITAL_COMMUNITY): Payer: Self-pay | Admitting: Pharmacist

## 2018-02-19 ENCOUNTER — Encounter (HOSPITAL_COMMUNITY): Payer: Self-pay

## 2018-02-19 ENCOUNTER — Inpatient Hospital Stay (HOSPITAL_COMMUNITY): Payer: Medicare Other

## 2018-02-19 VITALS — BP 143/73 | HR 72 | Temp 98.6°F | Resp 20

## 2018-02-19 DIAGNOSIS — M8580 Other specified disorders of bone density and structure, unspecified site: Secondary | ICD-10-CM

## 2018-02-19 DIAGNOSIS — Z170 Estrogen receptor positive status [ER+]: Secondary | ICD-10-CM

## 2018-02-19 DIAGNOSIS — C50912 Malignant neoplasm of unspecified site of left female breast: Secondary | ICD-10-CM | POA: Diagnosis present

## 2018-02-19 LAB — CBC WITH DIFFERENTIAL/PLATELET
Basophils Absolute: 0 10*3/uL (ref 0.0–0.1)
Basophils Relative: 0 %
EOS ABS: 0.1 10*3/uL (ref 0.0–0.7)
Eosinophils Relative: 1 %
HCT: 43.3 % (ref 36.0–46.0)
HEMOGLOBIN: 14.3 g/dL (ref 12.0–15.0)
LYMPHS ABS: 2.2 10*3/uL (ref 0.7–4.0)
LYMPHS PCT: 30 %
MCH: 31.4 pg (ref 26.0–34.0)
MCHC: 33 g/dL (ref 30.0–36.0)
MCV: 95.2 fL (ref 78.0–100.0)
Monocytes Absolute: 0.7 10*3/uL (ref 0.1–1.0)
Monocytes Relative: 10 %
NEUTROS PCT: 59 %
Neutro Abs: 4.3 10*3/uL (ref 1.7–7.7)
Platelets: 212 10*3/uL (ref 150–400)
RBC: 4.55 MIL/uL (ref 3.87–5.11)
RDW: 11.9 % (ref 11.5–15.5)
WBC: 7.2 10*3/uL (ref 4.0–10.5)

## 2018-02-19 LAB — COMPREHENSIVE METABOLIC PANEL
ALK PHOS: 64 U/L (ref 38–126)
ALT: 33 U/L (ref 14–54)
AST: 29 U/L (ref 15–41)
Albumin: 4.3 g/dL (ref 3.5–5.0)
Anion gap: 8 (ref 5–15)
BUN: 17 mg/dL (ref 6–20)
CALCIUM: 9.9 mg/dL (ref 8.9–10.3)
CO2: 28 mmol/L (ref 22–32)
CREATININE: 0.68 mg/dL (ref 0.44–1.00)
Chloride: 104 mmol/L (ref 101–111)
GFR calc non Af Amer: >60 mL/min (ref >60)
Glucose, Bld: 105 mg/dL — ABNORMAL HIGH (ref 65–99)
Potassium: 4 mmol/L (ref 3.5–5.1)
SODIUM: 140 mmol/L (ref 135–145)
Total Bilirubin: 0.8 mg/dL (ref 0.3–1.2)
Total Protein: 7.2 g/dL (ref 6.5–8.1)

## 2018-02-19 MED ORDER — DENOSUMAB 60 MG/ML ~~LOC~~ SOSY
60.0000 mg | PREFILLED_SYRINGE | Freq: Once | SUBCUTANEOUS | Status: AC
  Administered 2018-02-19: 60 mg via SUBCUTANEOUS
  Filled 2018-02-19: qty 1

## 2018-02-19 MED ORDER — DENOSUMAB 60 MG/ML ~~LOC~~ SOSY
60.0000 mg | PREFILLED_SYRINGE | Freq: Once | SUBCUTANEOUS | Status: DC
  Filled 2018-02-19: qty 1

## 2018-02-19 MED ORDER — DENOSUMAB 60 MG/ML ~~LOC~~ SOLN
60.0000 mg | Freq: Once | SUBCUTANEOUS | Status: DC
  Filled 2018-02-19: qty 1

## 2018-02-19 NOTE — Progress Notes (Signed)
Patient tolerated prolia shot with no complaints of pain.  Denied tooth, jaw, or leg pain.  Taking calcium as directed.  No recent or upcoming dental visits.  Band aid applied.  VSS with discharge and left ambulatory with no s/s of distress noted.

## 2018-02-19 NOTE — Patient Instructions (Signed)
[facility]  Discharge Instructions:  You received a prolia shot today.   _______________________________________________________________  Thank you for choosing [facility] to provide your oncology and hematology care.  To afford each patient quality time with our providers, please arrive at least 15 minutes before your scheduled appointment.  You need to re-schedule your appointment if you arrive 10 or more minutes late.  We strive to give you quality time with our providers, and arriving late affects you and other patients whose appointments are after yours.  Also, if you no show three or more times for appointments you may be dismissed from the clinic.  Again, thank you for choosing [facility] at [facility]. Our hope is that these requests will allow you access to exceptional care and in a timely manner. _______________________________________________________________  If you have questions after your visit, please contact our office at [phone] between the hours of 8:30 a.m. and 5:00 p.m. Voicemails left after 4:30 p.m. will not be returned until the following business day. _______________________________________________________________  For prescription refill requests, have your pharmacy contact our office. _______________________________________________________________  Recommendations made by the consultant and any test results will be sent to your referring physician. _______________________________________________________________ 

## 2018-03-03 ENCOUNTER — Ambulatory Visit: Payer: Medicare Other | Admitting: Cardiovascular Disease
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
## 2018-03-18 ENCOUNTER — Ambulatory Visit: Payer: Medicare Other | Admitting: Student

## 2018-03-18 ENCOUNTER — Encounter: Payer: Self-pay | Admitting: Student

## 2018-03-18 VITALS — BP 130/84 | HR 61 | Ht 66.0 in | Wt 171.0 lb

## 2018-03-18 DIAGNOSIS — G4733 Obstructive sleep apnea (adult) (pediatric): Secondary | ICD-10-CM

## 2018-03-18 DIAGNOSIS — I10 Essential (primary) hypertension: Secondary | ICD-10-CM | POA: Diagnosis not present

## 2018-03-18 DIAGNOSIS — R0789 Other chest pain: Secondary | ICD-10-CM

## 2018-03-18 DIAGNOSIS — E782 Mixed hyperlipidemia: Secondary | ICD-10-CM | POA: Diagnosis not present

## 2018-03-18 MED ORDER — POTASSIUM CHLORIDE CRYS ER 20 MEQ PO TBCR: 20.0000 meq | EXTENDED_RELEASE_TABLET | Freq: Every day | ORAL | 3 refills | Status: DC

## 2018-03-18 NOTE — Patient Instructions (Signed)
Medication Instructions:  Your physician recommends that you continue on your current medications as directed. Please refer to the Current Medication list given to you today.   Labwork: NONE   Testing/Procedures: NONE  Follow-Up: Your physician wants you to follow-up in: 1 Year with Dr. McDowell. You will receive a reminder letter in the mail two months in advance. If you don't receive a letter, please call our office to schedule the follow-up appointment.   Any Other Special Instructions Will Be Listed Below (If Applicable).     If you need a refill on your cardiac medications before your next appointment, please call your pharmacy.  Thank you for choosing [facility]!   

## 2018-03-24 ENCOUNTER — Other Ambulatory Visit (INDEPENDENT_AMBULATORY_CARE_PROVIDER_SITE_OTHER): Payer: Medicare Other

## 2018-03-24 DIAGNOSIS — I10 Essential (primary) hypertension: Secondary | ICD-10-CM | POA: Diagnosis not present

## 2018-03-24 DIAGNOSIS — E785 Hyperlipidemia, unspecified: Secondary | ICD-10-CM

## 2018-03-24 DIAGNOSIS — R739 Hyperglycemia, unspecified: Secondary | ICD-10-CM | POA: Diagnosis not present

## 2018-03-24 LAB — COMPREHENSIVE METABOLIC PANEL
ALBUMIN: 4.2 g/dL (ref 3.5–5.2)
ALT: 22 U/L (ref 0–35)
AST: 22 U/L (ref 0–37)
Alkaline Phosphatase: 58 U/L (ref 39–117)
BUN: 19 mg/dL (ref 6–23)
CHLORIDE: 104 meq/L (ref 96–112)
CO2: 29 mEq/L (ref 19–32)
Calcium: 9.7 mg/dL (ref 8.4–10.5)
Creatinine, Ser: 0.73 mg/dL (ref 0.40–1.20)
GFR: 83.47 mL/min (ref >60.00)
Glucose, Bld: 97 mg/dL (ref 70–99)
Potassium: 4.3 mEq/L (ref 3.5–5.1)
SODIUM: 140 meq/L (ref 135–145)
Total Bilirubin: 0.5 mg/dL (ref 0.2–1.2)
Total Protein: 6.5 g/dL (ref 6.0–8.3)

## 2018-03-24 LAB — LIPID PANEL
CHOL/HDL RATIO: 4
Cholesterol: 169 mg/dL (ref 0–200)
HDL: 37.5 mg/dL — ABNORMAL LOW (ref >39.00)
LDL CALC: 99 mg/dL (ref 0–99)
NonHDL: 131.1
TRIGLYCERIDES: 162 mg/dL — AB (ref 0.0–149.0)
VLDL: 32.4 mg/dL (ref 0.0–40.0)

## 2018-03-24 LAB — CBC
HEMATOCRIT: 41.9 % (ref 36.0–46.0)
Hemoglobin: 14.2 g/dL (ref 12.0–15.0)
MCHC: 33.8 g/dL (ref 30.0–36.0)
MCV: 94.4 fl (ref 78.0–100.0)
Platelets: 219 10*3/uL (ref 150.0–400.0)
RBC: 4.44 Mil/uL (ref 3.87–5.11)
RDW: 12.4 % (ref 11.5–15.5)
WBC: 6.3 10*3/uL (ref 4.0–10.5)

## 2018-03-24 LAB — HEMOGLOBIN A1C: Hgb A1c MFr Bld: 6 % (ref 4.6–6.5)

## 2018-03-28 ENCOUNTER — Ambulatory Visit (INDEPENDENT_AMBULATORY_CARE_PROVIDER_SITE_OTHER): Payer: Medicare Other | Admitting: Family Medicine

## 2018-03-28 ENCOUNTER — Encounter: Payer: Self-pay | Admitting: Family Medicine

## 2018-03-28 VITALS — BP 136/72 | HR 51 | Temp 98.1°F | Ht 66.0 in | Wt 167.8 lb

## 2018-03-28 DIAGNOSIS — Z1211 Encounter for screening for malignant neoplasm of colon: Secondary | ICD-10-CM

## 2018-03-28 DIAGNOSIS — Z0000 Encounter for general adult medical examination without abnormal findings: Secondary | ICD-10-CM
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
## 2018-03-28 NOTE — Patient Instructions (Addendum)
Health Maintenance Due  Topic Date Due  . COLONOSCOPY -referral placed today. We will call you within two weeks about your referral to GI. If you do not hear within 3 weeks, give [place] a call.  02/12/2018   No changes today  Or if there are concerns about coverage for next year- could do mid December instead  [place] job on eating better. Lets bump that exercise up- ideally 5 days a week if possible to lower risk of diabetes

## 2018-04-09 ENCOUNTER — Encounter: Payer: Self-pay | Admitting: Gastroenterology

## 2018-04-09 ENCOUNTER — Encounter: Payer: Self-pay | Admitting: Family Medicine

## 2018-04-22 ENCOUNTER — Other Ambulatory Visit: Payer: Self-pay | Admitting: Family Medicine

## 2018-05-05 ENCOUNTER — Ambulatory Visit (AMBULATORY_SURGERY_CENTER): Payer: Self-pay

## 2018-05-05 ENCOUNTER — Other Ambulatory Visit: Payer: Self-pay

## 2018-05-05 VITALS — Ht 66.0 in | Wt 170.6 lb

## 2018-05-05 DIAGNOSIS — Z1211 Encounter for screening for malignant neoplasm of colon: Secondary | ICD-10-CM

## 2018-05-05 MED ORDER — NA SULFATE-K SULFATE-MG SULF 17.5-3.13-1.6 GM/177ML PO SOLN: 1.0000 | Freq: Once | ORAL | 0 refills | Status: AC

## 2018-05-05 NOTE — Progress Notes (Signed)
No egg or soy allergy known to patient  No issues with past sedation with any surgeries  or procedures, no intubation problems  No diet pills per patient No home 02 use per patient  No blood thinners per patient  Pt denies issues with constipation  No A fib or A flutter  EMMI video sent to pt's e mail , pt declined    

## 2018-05-22 ENCOUNTER — Encounter: Payer: Self-pay | Admitting: Gastroenterology

## 2018-05-31 ENCOUNTER — Other Ambulatory Visit: Payer: Self-pay | Admitting: Family Medicine

## 2018-06-05 ENCOUNTER — Encounter: Payer: Self-pay | Admitting: Gastroenterology

## 2018-06-05 ENCOUNTER — Ambulatory Visit (AMBULATORY_SURGERY_CENTER): Payer: Medicare Other | Admitting: Gastroenterology

## 2018-06-05 ENCOUNTER — Other Ambulatory Visit: Payer: Self-pay

## 2018-06-05 VITALS — BP 87/57 | HR 60 | Temp 97.8°F | Resp 17 | Ht 66.0 in | Wt 170.0 lb

## 2018-06-05 DIAGNOSIS — D123 Benign neoplasm of transverse colon: Secondary | ICD-10-CM

## 2018-06-05 DIAGNOSIS — D120 Benign neoplasm of cecum: Secondary | ICD-10-CM | POA: Diagnosis not present

## 2018-06-05 DIAGNOSIS — Z1211 Encounter for screening for malignant neoplasm of colon: Secondary | ICD-10-CM

## 2018-06-05 DIAGNOSIS — D122 Benign neoplasm of ascending colon: Secondary | ICD-10-CM

## 2018-06-05 MED ORDER — SODIUM CHLORIDE 0.9 % IV SOLN
500.0000 mL | Freq: Once | INTRAVENOUS | Status: DC
Start: 2018-06-05 — End: 2018-06-05

## 2018-06-05 NOTE — Progress Notes (Signed)
Pt's states no medical or surgical changes since previsit or office visit. 

## 2018-06-05 NOTE — Progress Notes (Signed)
Called to room to assist during endoscopic procedure.  Patient ID and intended procedure confirmed with present staff. Received instructions for my participation in the procedure from the performing physician.  

## 2018-06-05 NOTE — Patient Instructions (Addendum)
Thank you for allowing [place] to care for you today!  Await pathology results by mail, 1-2 weeks.  Next colonoscopy to be determined after pathology results final.  Resume previous diet and medications.  Avoid NSAIDS (Ibuprofen, Naprosyn. Aleve) 2 weeks.  Return to normal activities tomorrow.  Handouts provided for polyps and diverticulosis.    YOU HAD AN ENDOSCOPIC PROCEDURE TODAY AT [facility]:   Refer to the procedure report that was given to you for any specific questions about what was found during the examination.  If the procedure report does not answer your questions, please call your gastroenterologist to clarify.  If you requested that your care partner not be given the details of your procedure findings, then the procedure report has been included in a sealed envelope for you to review at your convenience later.  YOU SHOULD EXPECT: Some feelings of bloating in the abdomen. Passage of more gas than usual.  Walking can help get rid of the air that was put into your GI tract during the procedure and reduce the bloating. If you had a lower endoscopy (such as a colonoscopy or flexible sigmoidoscopy) you may notice spotting of blood in your stool or on the toilet paper. If you underwent a bowel prep for your procedure, you may not have a normal bowel movement for a few days.  Please Note:  You might notice some irritation and congestion in your nose or some drainage.  This is from the oxygen used during your procedure.  There is no need for concern and it should clear up in a day or so.  SYMPTOMS TO REPORT IMMEDIATELY:   Following lower endoscopy (colonoscopy or flexible sigmoidoscopy):  Excessive amounts of blood in the stool  Significant tenderness or worsening of abdominal pains  Swelling of the abdomen that is new, acute  Fever of 100F or higher    For urgent or emergent issues, a gastroenterologist can be reached at any hour by calling [phone]  [phone].   DIET:  We do recommend a small meal at first, but then you may proceed to your regular diet.  Drink plenty of fluids but you should avoid alcoholic beverages for 24 hours.  ACTIVITY:  You should plan to take it easy for the rest of today and you should NOT DRIVE or use heavy machinery until tomorrow (because of the sedation medicines used during the test).    FOLLOW UP: Our staff will call the number listed on your records the next business day following your procedure to check on you and address any questions or concerns that you may have regarding the information given to you following your procedure. If we do not reach you, we will leave a message.  However, if you are feeling well and you are not experiencing any problems, there is no need to return our call.  We will assume that you have returned to your regular daily activities without incident.  If any biopsies were taken you will be contacted by phone or by letter within the next 1-3 weeks.  Please call us at [phone] if you have not heard about the biopsies in 3 weeks.    SIGNATURES/CONFIDENTIALITY: You and/or your care partner have signed paperwork which will be entered into your electronic medical record.  These signatures attest to the fact that that the information above on your After Visit Summary has been reviewed and is understood.  Full responsibility of the confidentiality of this discharge information lies with  you and/or your care-partner.

## 2018-06-05 NOTE — Progress Notes (Signed)
Report given to PACU, vss 

## 2018-06-05 NOTE — Op Note (Signed)
[facility] Patient Name: Angela [name] Procedure Date: 06/05/2018 8:34 AM MRN: [id number] Endoscopist: Mauri Pole , MD Age: 72 Referring MD:  Date of Birth: 1945/09/17 Gender: Female Account #: [id number] Procedure:                Colonoscopy Indications:              Screening for colorectal malignant neoplasm Medicines:                Monitored Anesthesia Care Procedure:                Pre-Anesthesia Assessment:                           - Prior to the procedure, a History and Physical                            was performed, and patient medications and                            allergies were reviewed. The patient's tolerance of                            previous anesthesia was also reviewed. The risks                            and benefits of the procedure and the sedation                            options and risks were discussed with the patient.                            All questions were answered, and informed consent                            was obtained. Prior Anticoagulants: The patient has                            taken no previous anticoagulant or antiplatelet                            agents. ASA Grade Assessment: II - A patient with                            mild systemic disease. After reviewing the risks                            and benefits, the patient was deemed in                            satisfactory condition to undergo the procedure.                           After obtaining informed consent, the colonoscope  was passed under direct vision. Throughout the                            procedure, the patient's blood pressure, pulse, and                            oxygen saturations were monitored continuously. The                            Colonoscope was introduced through the anus and                            advanced to the the cecum, identified by                            appendiceal orifice and  ileocecal valve. The                            colonoscopy was performed without difficulty. The                            patient tolerated the procedure well. The quality                            of the bowel preparation was excellent. The                            ileocecal valve, appendiceal orifice, and rectum                            were photographed. Scope In: 8:44:49 AM Scope Out: 9:04:31 AM Scope Withdrawal Time: 0 hours 15 minutes 21 seconds  Total Procedure Duration: 0 hours 19 minutes 42 seconds  Findings:                 The perianal and digital rectal examinations were                            normal.                           Four flat and sessile polyps were found in the                            transverse colon, ascending colon and cecum. The                            polyps were 7 to 15 mm in size. These polyps were                            removed with a cold snare. Resection and retrieval                            were complete.  Multiple small and large-mouthed diverticula were                            found in the sigmoid colon and descending colon.                           Non-bleeding internal hemorrhoids were found during                            retroflexion. The hemorrhoids were small. Complications:            No immediate complications. Estimated Blood Loss:     Estimated blood loss was minimal. Impression:               - Four 7 to 15 mm polyps in the transverse colon,                            in the ascending colon and in the cecum, removed                            with a cold snare. Resected and retrieved.                           - Diverticulosis in the sigmoid colon and in the                            descending colon.                           - Non-bleeding internal hemorrhoids. Recommendation:           - Patient has a contact number available for                            emergencies. The signs  and symptoms of potential                            delayed complications were discussed with the                            patient. Return to normal activities tomorrow.                            Written discharge instructions were provided to the                            patient.                           - Resume previous diet.                           - Continue present medications.                           - Await pathology results.                           -  Repeat colonoscopy in 3 - 5 years for                            surveillance based on pathology results. Mauri Pole, MD 06/05/2018 9:10:18 AM This report has been signed electronically.

## 2018-06-06 ENCOUNTER — Telehealth: Payer: Self-pay | Admitting: *Deleted

## 2018-06-06 NOTE — Telephone Encounter (Signed)
  Follow up Call-  Call back number 06/05/2018  Post procedure Call Back phone  # [phone]  Permission to leave phone message Yes  Some recent data might be hidden     Patient questions:  Do you have a fever, pain , or abdominal swelling? No. Pain Score  0 *  Have you tolerated food without any problems? Yes.    Have you been able to return to your normal activities? Yes.    Do you have any questions about your discharge instructions: Diet   No. Medications  No. Follow up visit  No.  Do you have questions or concerns about your Care? No.  Actions: * If pain score is 4 or above: No action needed, pain <4.

## 2018-06-11 ENCOUNTER — Telehealth: Payer: Self-pay | Admitting: Gastroenterology

## 2018-06-11 ENCOUNTER — Encounter: Payer: Self-pay | Admitting: Gastroenterology

## 2018-06-11 NOTE — Telephone Encounter (Signed)
Patient contacted. Read the letter to her. Letter mailed today.

## 2018-06-11 NOTE — Telephone Encounter (Signed)
Colon results

## 2018-06-12 ENCOUNTER — Encounter: Payer: Self-pay | Admitting: Family Medicine

## 2018-06-12 DIAGNOSIS — Z860100 Personal history of colon polyps, unspecified: Secondary | ICD-10-CM | POA: Insufficient documentation

## 2018-07-18 ENCOUNTER — Ambulatory Visit (HOSPITAL_COMMUNITY): Payer: Medicare Other

## 2018-07-18 ENCOUNTER — Ambulatory Visit: Payer: Medicare Other

## 2018-07-24 ENCOUNTER — Ambulatory Visit
Admission: RE | Admit: 2018-07-24 | Discharge: 2018-07-24 | Disposition: A | Discharge status: Home / Self Care | Payer: Medicare Other | Source: Ambulatory Visit | Attending: Adult Health | Admitting: Adult Health

## 2018-07-24 DIAGNOSIS — Z1231 Encounter for screening mammogram for malignant neoplasm of breast: Secondary | ICD-10-CM

## 2018-08-18 ENCOUNTER — Other Ambulatory Visit: Payer: Self-pay

## 2018-08-18 ENCOUNTER — Encounter (HOSPITAL_COMMUNITY): Payer: Self-pay | Admitting: *Deleted

## 2018-08-18 ENCOUNTER — Emergency Department (HOSPITAL_COMMUNITY)
Admission: EM | Admit: 2018-08-18 | Discharge: 2018-08-18 | Disposition: A | Discharge status: Home / Self Care | Payer: Medicare Other | Attending: Emergency Medicine | Admitting: Emergency Medicine

## 2018-08-18 ENCOUNTER — Emergency Department (HOSPITAL_COMMUNITY): Payer: Medicare Other

## 2018-08-18 DIAGNOSIS — I10 Essential (primary) hypertension: Secondary | ICD-10-CM | POA: Diagnosis not present

## 2018-08-18 DIAGNOSIS — R0602 Shortness of breath: Secondary | ICD-10-CM | POA: Diagnosis not present

## 2018-08-18 DIAGNOSIS — Z79899 Other long term (current) drug therapy: Secondary | ICD-10-CM | POA: Diagnosis not present

## 2018-08-18 DIAGNOSIS — R0789 Other chest pain: Secondary | ICD-10-CM | POA: Insufficient documentation

## 2018-08-18 DIAGNOSIS — E876 Hypokalemia: Secondary | ICD-10-CM | POA: Diagnosis not present

## 2018-08-18 DIAGNOSIS — R079 Chest pain, unspecified: Secondary | ICD-10-CM | POA: Diagnosis present

## 2018-08-18 DIAGNOSIS — Z7982 Long term (current) use of aspirin: Secondary | ICD-10-CM | POA: Diagnosis not present

## 2018-08-18 LAB — CBC
HEMATOCRIT: 40.6 % (ref 36.0–46.0)
HEMOGLOBIN: 13.3 g/dL (ref 12.0–15.0)
MCH: 31 pg (ref 26.0–34.0)
MCHC: 32.8 g/dL (ref 30.0–36.0)
MCV: 94.6 fL (ref 80.0–100.0)
Platelets: 230 10*3/uL (ref 150–400)
RBC: 4.29 MIL/uL (ref 3.87–5.11)
RDW: 11.8 % (ref 11.5–15.5)
WBC: 9.2 10*3/uL (ref 4.0–10.5)
nRBC: 0 % (ref 0.0–0.2)

## 2018-08-18 LAB — BASIC METABOLIC PANEL
Anion gap: 7 (ref 5–15)
BUN: 20 mg/dL (ref 8–23)
CO2: 28 mmol/L (ref 22–32)
Calcium: 9.8 mg/dL (ref 8.9–10.3)
Chloride: 105 mmol/L (ref 98–111)
Creatinine, Ser: 0.65 mg/dL (ref 0.44–1.00)
GFR calc Af Amer: >60 mL/min (ref >60)
GLUCOSE: 109 mg/dL — AB (ref 70–99)
POTASSIUM: 3.1 mmol/L — AB (ref 3.5–5.1)
Sodium: 140 mmol/L (ref 135–145)

## 2018-08-18 LAB — I-STAT TROPONIN, ED: TROPONIN I, POC: 0.01 ng/mL (ref 0.00–0.08)

## 2018-08-18 LAB — BRAIN NATRIURETIC PEPTIDE: B Natriuretic Peptide: 43 pg/mL (ref 0.0–100.0)

## 2018-08-18 LAB — TROPONIN I: Troponin I: <0.03 ng/mL (ref <0.03)

## 2018-08-18 MED ORDER — POTASSIUM CHLORIDE CRYS ER 20 MEQ PO TBCR: 20.0000 meq | EXTENDED_RELEASE_TABLET | Freq: Two times a day (BID) | ORAL | 0 refills | Status: DC

## 2018-08-18 MED ORDER — POTASSIUM CHLORIDE CRYS ER 20 MEQ PO TBCR
40.0000 meq | EXTENDED_RELEASE_TABLET | Freq: Once | ORAL | Status: AC
  Administered 2018-08-18: 40 meq via ORAL
  Filled 2018-08-18: qty 2

## 2018-08-18 NOTE — Discharge Instructions (Addendum)
Take the extra potassium pills until gone.  Please call Dr. Myles Gip office this morning to let him know about your ED visit.  They may want to see you in the office this week for further evaluation.  Return to the emergency department if you have chest pain that lasts constantly for over 30 minutes or if it does not go away with your nitroglycerin.

## 2018-08-18 NOTE — ED Triage Notes (Signed)
Pt c/o intermittent chest pain all week with some chest tightness and sob; pt states this evening she had some "pinprick" pain under her left breast; pt states she has taken 3-4 nitroglycerin SL throughout the week with relief of pain when taken; pt states the tightness in her chest started out on the right side and has moved to the left

## 2018-08-18 NOTE — ED Provider Notes (Signed)
[facility] EMERGENCY DEPARTMENT Provider Note   CSN: [id number] Arrival date & time: 08/18/18  0148  Time seen 3:30 AM   History   Chief Complaint Chief Complaint  Patient presents with  . Chest Pain    HPI Angela [name] is a 72 y.o. female.  HPI patient states since Thanksgiving, November 28 she has been having right-sided aching chest pain that comes and goes.  A few days ago it started occurring on her left side and then tonight she started having jabbing, pins-and-needles pains on her left side.  She states the pain only lasts about 5 minutes and she takes nitroglycerin and it goes away.  She states she has had 4-5 episodes since Thanksgiving and each time she took a nitroglycerin and it went away.  But she also told me she gets the chest pain about once a day.  She also describes shortness of breath is not related to the chest pain or to exertion.  The shortness of breath lasts a few minutes.  She denies cough, fever, nausea, or vomiting.  She states she has had chest pain before and is been to the ER at least 4 times over the last several years for the same.  It has gotten better since her doctor started her on metoprolol.  Her last episode of chest pain which was the pins-and-needles sensation in her left side was at 11:30 PM.  The last episode of shortness of breath was earlier today.  She states sometimes her chest feels tight.  She states she has had a nuclear medicine stress test that was negative.  Patient states she currently feels okay and is not having chest pain. Patient does not smoke, she does not have diabetes.  She does have hypertension and high lipids.  She states both her parents died of myocardial infarction, father at age 17 and mother at age 52.  PCP Marin Olp, MD Cardiology Dr Domenic Polite   Past Medical History:  Diagnosis Date  . Arthritis    mild  . Arthritis pain   . BCC (basal cell carcinoma)   . Breast cancer ([place]) 2009  . Cataract   .  Hyperlipidemia   . Hypertension   . Inflammation of bone ([place]) current   foot, in a boot for 6 weeks  . Macular retinal cyst of right eye   . OSA (obstructive sleep apnea) 08/24/2015  . Osteopenia   . Personal history of radiation therapy   . Sleep apnea    cpap  . Tachycardia     Patient Active Problem List   Diagnosis Date Noted  . History of colon polyps 06/12/2018  . History of skin cancer 02/21/2016  . OSA (obstructive sleep apnea) 08/24/2015  . Hyperglycemia 11/12/2014  . Chest discomfort 10/05/2014  . Osteopenia determined by x-ray 06/04/2014  . 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 Chest pain 12/21/2011  . Breast cancer ([place]) 03/20/2011  . HTN (hypertension) 07/13/2008  . Vitamin D deficiency 07/09/2007  . Hyperlipidemia 07/09/2007    Past Surgical History:  Procedure Laterality Date  . bcc equired excision    . BREAST EXCISIONAL BIOPSY    . BREAST LUMPECTOMY  10/09   breast cancer, left  . BREAST LUMPECTOMY    . CATARACT EXTRACTION  2012   [facility]  . COLONOSCOPY    . eyelid surgery bilateral    . MASS EXCISION  7/09   appendix  . TONSILLECTOMY       OB History  Gravida  3   Para  2   Term  2   Preterm      AB      Living  2     SAB      TAB      Ectopic      Multiple      Live Births               Home Medications    Prior to Admission medications   Medication Sig Start Date End Date Taking? Authorizing Provider  aspirin 81 MG tablet Take 81 mg by mouth every evening.     [provider]  Calcium Carbonate-Vitamin D (CALTRATE 600+D) 600-400 MG-UNIT per tablet Take 2 tablets by mouth every evening.     [provider]  Cholecalciferol (D 2000) 2000 UNITS TABS Take 1 tablet by mouth every evening.    [provider]  co-enzyme Q-10 30 MG capsule Take 200 mg by mouth every evening.     [provider]  denosumab (PROLIA) 60 MG/ML SOLN injection Inject 60 mg into the skin every 6 (six)  months.     [provider]  fish oil-omega-3 fatty acids 1000 MG capsule Take 1 g by mouth every evening.     [provider]  hydrochlorothiazide (HYDRODIURIL) 25 MG tablet Take 1 tablet (25 mg total) by mouth daily. 10/10/17   Marin Olp, MD  metoprolol succinate (TOPROL-XL) 50 MG 24 hr tablet TAKE 1 TABLET BY MOUTH ONCE DAILY WITH FOOD 04/23/18   Marin Olp, MD  Multiple Vitamin (MULTIVITAMIN) tablet Take 1 tablet by mouth daily.      [provider]  nitroGLYCERIN (NITROSTAT) 0.4 MG SL tablet Place 1 tablet (0.4 mg total) under the tongue every 5 (five) minutes as needed for chest pain. 01/14/17 04/14/17  Nahser, Wonda Cheng, MD  potassium chloride SA (K-DUR,KLOR-CON) 20 MEQ tablet Take 1 tablet (20 mEq total) by mouth daily. 03/18/18   Strader, Fransisco Hertz, PA-C  potassium chloride SA (K-DUR,KLOR-CON) 20 MEQ tablet Take 1 tablet (20 mEq total) by mouth 2 (two) times daily. 08/18/18   Rolland Porter, MD  simvastatin (ZOCOR) 20 MG tablet TAKE 1 TABLET BY MOUTH AT BEDTIME 06/02/18   Marin Olp, MD  vitamin C (ASCORBIC ACID) 500 MG tablet Take 500 mg by mouth every evening.     [provider]    Family History Family History  Problem Relation Age of Onset  . Heart attack Mother   . Hypertension Mother   . Heart disease Father   . Heart attack Father   . Hypertension Father   . Colon cancer Neg Hx   . Stroke Neg Hx   . Esophageal cancer Neg Hx   . Rectal cancer Neg Hx   . Stomach cancer Neg Hx     Social History Social History   Tobacco Use  . Smoking status: Never Smoker  . Smokeless tobacco: Never Used  Substance Use Topics  . Alcohol use: Yes    Alcohol/week: 3.0 standard drinks    Types: 3 Glasses of wine per week    Comment: one glass a week  . Drug use: No  Lives at home Lives with spouse   Allergies   Lisinopril; Fish-derived products; and Statins   Review of Systems Review of Systems  All other systems reviewed and are  negative.    Physical Exam Updated Vital Signs BP (!) 157/85   Pulse Marland Kitchen)  56   Temp 97.6 F (36.4 C) (Oral)   Resp 13   Ht 5\' 6"  (1.676 m)   Wt 77.1 kg   SpO2 96%   BMI 27.44 kg/m   Physical Exam  Constitutional: She is oriented to person, place, and time. She appears well-developed and well-nourished.  Non-toxic appearance. She does not appear ill. No distress.  HENT:  Head: Normocephalic and atraumatic.  Right Ear: External ear normal.  Left Ear: External ear normal.  Nose: Nose normal. No mucosal edema or rhinorrhea.  Mouth/Throat: Oropharynx is clear and moist and mucous membranes are normal. No dental abscesses or uvula swelling.  Eyes: Pupils are equal, round, and reactive to light. Conjunctivae and EOM are normal.  Neck: Normal range of motion and full passive range of motion without pain. Neck supple.  Cardiovascular: Normal rate, regular rhythm and normal heart sounds. Exam reveals no gallop and no friction rub.  No murmur heard. Pulmonary/Chest: Effort normal and breath sounds normal. No respiratory distress. She has no wheezes. She has no rhonchi. She has no rales. She exhibits no tenderness and no crepitus.  Abdominal: Soft. Normal appearance and bowel sounds are normal. She exhibits no distension. There is no tenderness. There is no rebound and no guarding.  Musculoskeletal: Normal range of motion. She exhibits no edema or tenderness.  Moves all extremities well.   Neurological: She is alert and oriented to person, place, and time. She has normal strength. No cranial nerve deficit.  Skin: Skin is warm, dry and intact. No rash noted. No erythema. No pallor.  Psychiatric: She has a normal mood and affect. Her speech is normal and behavior is normal. Her mood appears not anxious.  Nursing note and vitals reviewed.    ED Treatments / Results  Labs (all labs ordered are listed, but only abnormal results are displayed) Results for orders placed or performed during the  hospital encounter of 46/80/32  Basic metabolic panel  Result Value Ref Range   Sodium 140 135 - 145 mmol/L   Potassium 3.1 (L) 3.5 - 5.1 mmol/L   Chloride 105 98 - 111 mmol/L   CO2 28 22 - 32 mmol/L   Glucose, Bld 109 (H) 70 - 99 mg/dL   BUN 20 8 - 23 mg/dL   Creatinine, Ser 0.65 0.44 - 1.00 mg/dL   Calcium 9.8 8.9 - 10.3 mg/dL   GFR calc non Af Amer >60 >60 mL/min   GFR calc Af Amer >60 >60 mL/min   Anion gap 7 5 - 15  CBC  Result Value Ref Range   WBC 9.2 4.0 - 10.5 K/uL   RBC 4.29 3.87 - 5.11 MIL/uL   Hemoglobin 13.3 12.0 - 15.0 g/dL   HCT 40.6 36.0 - 46.0 %   MCV 94.6 80.0 - 100.0 fL   MCH 31.0 26.0 - 34.0 pg   MCHC 32.8 30.0 - 36.0 g/dL   RDW 11.8 11.5 - 15.5 %   Platelets 230 150 - 400 K/uL   nRBC 0.0 0.0 - 0.2 %  Brain natriuretic peptide  Result Value Ref Range   B Natriuretic Peptide 43.0 0.0 - 100.0 pg/mL  Troponin I - Once  Result Value Ref Range   Troponin I <0.03 <0.03 ng/mL  I-stat troponin, ED  Result Value Ref Range   Troponin i, poc 0.01 0.00 - 0.08 ng/mL   Comment 3           Laboratory interpretation all normal except hypokalemia  EKG EKG Interpretation  Date/Time:  Monday August 18 2018 02:09:19 EST Ventricular Rate:  66 PR Interval:    QRS Duration: 100 QT Interval:  458 QTC Calculation: 480 R Axis:   2 Text Interpretation:  Sinus rhythm Minimal voltage criteria for LVH, may be normal variant No significant change since last tracing 10 Dec 2016 Confirmed by Rolland Porter [phone]) on 08/18/2018 2:32:01 AM   Radiology Dg Chest 2 View  Result Date: 08/18/2018 CLINICAL DATA:  Intermittent chest pain for the past week with some chest tightness and shortness of breath. EXAM: CHEST - 2 VIEW COMPARISON:  12/10/2016. FINDINGS: Stable mildly enlarged cardiac silhouette and tortuous aorta. Stable linear scarring in both lower lung zones. The pulmonary vasculature remains mildly prominent. No pleural fluid. Mild thoracic spine degenerative changes.  IMPRESSION: Stable mild cardiomegaly, mild pulmonary vascular congestion and bilateral scarring. Electronically Signed   By: Claudie Revering M.D.   On: 08/18/2018 02:57    Procedures Procedures (including critical care time)  Jan 16, 2017 Myocardial perfusion imaging The study was normal, no ischemia, no evidence of infarction, this is a low risk study.  Nuclear stress ejection fraction was 63%, the left ventricular ejection fraction is normal.  I reviewed Dr. Elmarie Shiley last note from May 2018.  He reports she has had atypical chest pain and shortness of breath for years.  That is why he requested the above study.  Medications Ordered in ED Medications  potassium chloride SA (K-DUR,KLOR-CON) CR tablet 40 mEq (40 mEq Oral Given 08/18/18 0353)     Initial Impression / Assessment and Plan / ED Course  I have reviewed the triage vital signs and the nursing notes.  Pertinent labs & imaging results that were available during my care of the patient were reviewed by me and considered in my medical decision making (see chart for details).     We discussed her laboratory tests which were reassuring.  Her potassium was low and she is on a diuretic.  She states she takes a potassium pill daily but have daily is not enough.  She was given an additional dose of potassium 40 mEq in the ED.  She is currently not having any symptoms.  We discussed doing another troponin after 430 which will be 5 hours after her last episode of chest discomfort.  After reviewing her chest x-ray a BNP was added to her blood work.  Recheck at 5:55 AM patient's delta troponin was negative.  She has remained pain-free in the ED.  She was discharged home.  She should call her cardiologist office this morning to let them know about her ED visit, they may want to see her in the office this week.  She was advised to return to the ED if she gets chest pain that is constant and lasts more than 30 minutes or if her nitroglycerin does not  resolve it.  Final Clinical Impressions(s) / ED Diagnoses   Final diagnoses:  Atypical chest pain  Shortness of breath  Hypokalemia    ED Discharge Orders         Ordered    potassium chloride SA (K-DUR,KLOR-CON) 20 MEQ tablet  2 times daily     08/18/18 0602         Plan discharge  Rolland Porter, MD, Barbette Or, MD 08/18/18 [phone]

## 2018-08-19 ENCOUNTER — Encounter: Payer: Self-pay | Admitting: Cardiology

## 2018-08-19 NOTE — Progress Notes (Signed)
Cardiology Office Note  Date: 08/20/2018   ID: Angela [name], DOB July 03, 1946, MRN [id number]  PCP: Marin Olp, MD  Evaluating Cardiologist: Rozann Lesches, MD   Chief Complaint  Patient presents with  . Cardiac follow-up    History of Present Illness: Angela [name] is a 72 y.o. female last seen by Ms. Strader PA-C in July.  She was seen in the ER recently on December 9 with atypical chest pain.  Troponin I levels were negative and her ECG showed no acute ST segment changes.  She is here today with her husband, also a patient of mine.  She states that since Thanksgiving she has been having recurring chest discomfort, typically feels like a pressure, other times more like a pinprick sensation.  She has been more short of breath with activities, not necessarily any palpitations however.  She has taken some nitroglycerin with improvement in the pressure sensation.  Lexiscan Myoview in May 2018 was low risk as outlined below.  She has OSA on CPAP, hypertension, and hyperlipidemia as personal cardiac risk factors, also family history of heart disease in both parents.  I reviewed her medications.  She is on aspirin, Toprol-XL, Zocor, and as needed nitroglycerin.  Potassium was low at recent ER visit, supplements were increased as she is concurrently on HCTZ.  Past Medical History:  Diagnosis Date  . Arthritis   . BCC (basal cell carcinoma)   . Breast cancer ([place]) 2009  . Cataract   . Hyperlipidemia   . Hypertension   . Macular retinal cyst of right eye   . OSA (obstructive sleep apnea)    CPAP  . Osteopenia   . Personal history of radiation therapy     Past Surgical History:  Procedure Laterality Date  . BREAST EXCISIONAL BIOPSY    . BREAST LUMPECTOMY Left 06/2008  . BREAST LUMPECTOMY    . CATARACT EXTRACTION  2012  . COLONOSCOPY    . Eyelid surgery Bilateral   . MASS EXCISION  03/2008   Appendix  . SKIN CANCER EXCISION    . TONSILLECTOMY      Current Outpatient  Medications  Medication Sig Dispense Refill  . aspirin 81 MG tablet Take 81 mg by mouth every evening.     . Calcium Carbonate-Vitamin D (CALTRATE 600+D) 600-400 MG-UNIT per tablet Take 2 tablets by mouth every evening.     . Cholecalciferol (D 2000) 2000 UNITS TABS Take 1 tablet by mouth every evening.    Marland Kitchen co-enzyme Q-10 30 MG capsule Take 200 mg by mouth every evening.     . denosumab (PROLIA) 60 MG/ML SOLN injection Inject 60 mg into the skin every 6 (six) months.     . fish oil-omega-3 fatty acids 1000 MG capsule Take 1 g by mouth every evening.     . hydrochlorothiazide (HYDRODIURIL) 25 MG tablet Take 1 tablet (25 mg total) by mouth daily. 90 tablet 3  . metoprolol succinate (TOPROL-XL) 50 MG 24 hr tablet TAKE 1 TABLET BY MOUTH ONCE DAILY WITH FOOD 90 tablet 2  . Multiple Vitamin (MULTIVITAMIN) tablet Take 1 tablet by mouth daily.      . nitroGLYCERIN (NITROSTAT) 0.4 MG SL tablet Place 1 tablet (0.4 mg total) under the tongue every 5 (five) minutes as needed for chest pain. 25 tablet 6  . potassium chloride SA (K-DUR,KLOR-CON) 20 MEQ tablet Take 1 tablet (20 mEq total) by mouth 2 (two) times daily. 8 tablet 0  . simvastatin (ZOCOR) 20  MG tablet TAKE 1 TABLET BY MOUTH AT BEDTIME 90 tablet 2  . vitamin C (ASCORBIC ACID) 500 MG tablet Take 500 mg by mouth every evening.      No current facility-administered medications for this visit.    Allergies:  Lisinopril; Fish-derived products; and Statins   Social History: The patient  reports that she has never smoked. She has never used smokeless tobacco. She reports that she drinks about 3.0 standard drinks of alcohol per week. She reports that she does not use drugs.   Family History: The patient's family history includes Heart attack in her father and mother; Heart disease in her father; Hypertension in her father and mother.   ROS:  Please see the history of present illness. Otherwise, complete review of systems is positive for none.  All  other systems are reviewed and negative.   Physical Exam: VS:  BP (!) 144/78 (BP Location: Right Arm)   Pulse 62   Ht 5\' 6"  (1.676 m)   Wt 171 lb (77.6 kg)   SpO2 94%   BMI 27.60 kg/m , BMI Body mass index is 27.6 kg/m.  Wt Readings from Last 3 Encounters:  08/20/18 171 lb (77.6 kg)  08/18/18 170 lb (77.1 kg)  06/05/18 170 lb (77.1 kg)    General: Patient appears comfortable at rest. HEENT: Conjunctiva and lids normal, oropharynx clear with moist mucosa. Neck: Supple, no elevated JVP or carotid bruits, no thyromegaly. Lungs: Clear to auscultation, nonlabored breathing at rest. Cardiac: Regular rate and rhythm, no S3 or significant systolic murmur. Abdomen: Soft, nontender, bowel sounds present, no guarding or rebound. Extremities: No pitting edema, distal pulses 2+. Skin: Warm and dry. Musculoskeletal: No kyphosis. Neuropsychiatric: Alert and oriented x3, affect grossly appropriate.  ECG: I personally reviewed the tracing from 08/18/2018 which showed normal sinus rhythm.  Recent Labwork: 03/24/2018: ALT 22; AST 22 08/18/2018: B Natriuretic Peptide 43.0; BUN 20; Creatinine, Ser 0.65; Hemoglobin 13.3; Platelets 230; Potassium 3.1; Sodium 140     Component Value Date/Time   CHOL 169 03/24/2018 0811   CHOL 169 01/14/2017 0835   TRIG 162.0 (H) 03/24/2018 0811   HDL 37.50 (L) 03/24/2018 0811   HDL 34 (L) 01/14/2017 0835   CHOLHDL 4 03/24/2018 0811   VLDL 32.4 03/24/2018 0811   LDLCALC 99 03/24/2018 0811   LDLCALC 90 01/14/2017 0835   LDLDIRECT 170.3 07/09/2013 0813    Other Studies Reviewed Today:  [facility] Myoview 01/16/2017:  The study is normal. No ischemia . No evience of infarction  This is a low risk study.  Nuclear stress EF: 63%. The left ventricular ejection fraction is normal (55-65%).  Chest x-ray 08/18/2018: FINDINGS: Stable mildly enlarged cardiac silhouette and tortuous aorta. Stable linear scarring in both lower lung zones. The pulmonary  vasculature remains mildly prominent. No pleural fluid. Mild thoracic spine degenerative changes.  IMPRESSION: Stable mild cardiomegaly, mild pulmonary vascular congestion and bilateral scarring.  Assessment and Plan:  1.  Current chest tightness in a 72 year old woman with a history of hypertension, hyperlipidemia, OSA on CPAP and family history of CAD in both parents.  Recent ER visit noted, troponin I levels were negative.  She did undergo a Myoview study last year which was low risk, but symptoms have increased in frequency.  We discussed a variety of testing options, she prefers noninvasive approach so far.  We will proceed with a cardiac CT for further evaluation.  2.  Mixed hyperlipidemia on Zocor.  3.  Essential hypertension, on HCTZ with  potassium supplements along with Toprol-XL.  4.  History of palpitations, this has not been a recent feature with her chest discomfort.  Current medicines were reviewed with the patient today.   Orders Placed This Encounter  Procedures  . CT CORONARY MORPH W/CTA COR W/SCORE W/CA W/CM &/OR WO/CM  . CT CORONARY FRACTIONAL FLOW RESERVE DATA PREP  . CT CORONARY FRACTIONAL FLOW RESERVE FLUID ANALYSIS  . Basic Metabolic Panel (BMET)    Disposition: Call with test results.  Signed, Satira Sark, MD, [facility] 08/20/2018 9:06 AM    [place] [facility] [address], [place], [place] [address] Phone: [phone]; Fax: [phone]

## 2018-08-20 ENCOUNTER — Encounter: Payer: Self-pay | Admitting: Cardiology

## 2018-08-20 ENCOUNTER — Other Ambulatory Visit: Payer: Self-pay | Admitting: Cardiology

## 2018-08-20 ENCOUNTER — Ambulatory Visit: Payer: Medicare Other | Admitting: Cardiology

## 2018-08-20 VITALS — BP 144/78 | HR 62 | Ht 66.0 in | Wt 171.0 lb

## 2018-08-20 DIAGNOSIS — R0789 Other chest pain: Secondary | ICD-10-CM | POA: Diagnosis not present

## 2018-08-20 DIAGNOSIS — E782 Mixed hyperlipidemia: Secondary | ICD-10-CM

## 2018-08-20 DIAGNOSIS — Z87898 Personal history of other specified conditions: Secondary | ICD-10-CM

## 2018-08-20 DIAGNOSIS — Z8249 Family history of ischemic heart disease and other diseases of the circulatory system: Secondary | ICD-10-CM

## 2018-08-20 DIAGNOSIS — I10 Essential (primary) hypertension: Secondary | ICD-10-CM

## 2018-08-20 MED ORDER — NITROGLYCERIN 0.4 MG SL SUBL: 0.4000 mg | SUBLINGUAL_TABLET | SUBLINGUAL | 6 refills | Status: DC | PRN

## 2018-08-20 NOTE — Telephone Encounter (Signed)
done

## 2018-08-20 NOTE — Telephone Encounter (Signed)
Pt forgot to tell someone she needed a refill on her  nitroGLYCERIN (NITROSTAT) 0.4 MG SL tablet [[id number]]

## 2018-08-20 NOTE — Patient Instructions (Addendum)
Medication Instructions:  Your physician recommends that you continue on your current medications as directed. Please refer to the Current Medication list given to you today.   Labwork: NONE  Testing/Procedures: Please arrive at the [facility] main entrance of [facility] at xx:xx AM (30-45 minutes prior to test start time)  [facility], [place] [address] [phone]  Proceed to the [facility] Radiology Department (First Floor).  Please follow these instructions carefully (unless otherwise directed):  Hold all erectile dysfunction medications at least 48 hours prior to test.  On the Night Before the Test: . Be sure to Drink plenty of water. . Do not consume any caffeinated/decaffeinated beverages or chocolate 12 hours prior to your test. . Do not take any antihistamines 12 hours prior to your test. . If you take Metformin do not take 24 hours prior to test. On the Day of the Test: . Drink plenty of water. Do not drink any water within one hour of the test. . Do not eat any food 4 hours prior to the test. . You may take your regular medications prior to the test.  . Take metoprolol (Lopressor) two hours prior to test. . HOLD Furosemide/Hydrochlorothiazide morning of the test.   *For Clinical Staff only. Please instruct patient the following:*        -Drink plenty of water       -Hold Furosemide/hydrochlorothiazide morning of the test       -Take metoprolol (Lopressor) 2 hours prior to test (if applicable).                  -If HR is less than 55 BPM- No Beta Blocker                -IF HR is greater than 55 BPM and patient is less than or equal to 36 yrs old Lopressor 100mg  x1.                -If HR is greater than 55 BPM and patient is greater than 47 yrs old Lopressor 50 mg x1.     Do not give Lopressor to patients with an allergy to lopressor or anyone with asthma or active COPD symptoms (currently taking steroids).        After the Test: . Drink plenty of water. . After receiving IV contrast, you may experience a mild flushed feeling. This is normal. . On occasion, you may experience a mild rash up to 24 hours after the test. This is not dangerous. If this occurs, you can take Benadryl 25 mg and increase your fluid intake. . If you experience trouble breathing, this can be serious. If it is severe call 911 IMMEDIATELY. If it is mild, please call our office. . If you take any of these medications: Glipizide/Metformin, Avandament, Glucavance, please do not take 48 hours after completing test.   Follow-Up: Your physician wants you to follow-up in: 6 MONTHS.  You will receive a reminder letter in the mail two months in advance. If you don't receive a letter, please call our office to schedule the follow-up appointment.   Any Other Special Instructions Will Be Listed Below (If Applicable).     If you need a refill on your cardiac medications before your next appointment, please call your pharmacy.

## 2018-08-21 ENCOUNTER — Other Ambulatory Visit (HOSPITAL_COMMUNITY): Payer: Medicare Other

## 2018-08-21 ENCOUNTER — Ambulatory Visit (HOSPITAL_COMMUNITY): Payer: Medicare Other

## 2018-08-21 ENCOUNTER — Ambulatory Visit (HOSPITAL_COMMUNITY): Payer: Medicare Other | Admitting: Hematology

## 2018-08-21 NOTE — Telephone Encounter (Signed)
Thank you for updating that information.

## 2018-08-25 ENCOUNTER — Inpatient Hospital Stay (HOSPITAL_COMMUNITY): Payer: Medicare Other | Attending: Hematology

## 2018-08-25 DIAGNOSIS — Z170 Estrogen receptor positive status [ER+]: Secondary | ICD-10-CM | POA: Insufficient documentation

## 2018-08-25 DIAGNOSIS — C50919 Malignant neoplasm of unspecified site of unspecified female breast: Secondary | ICD-10-CM

## 2018-08-25 DIAGNOSIS — C50912 Malignant neoplasm of unspecified site of left female breast: Secondary | ICD-10-CM | POA: Diagnosis present

## 2018-08-25 DIAGNOSIS — M8580 Other specified disorders of bone density and structure, unspecified site: Secondary | ICD-10-CM | POA: Insufficient documentation

## 2018-08-25 LAB — COMPREHENSIVE METABOLIC PANEL
ALT: 24 U/L (ref 0–44)
ANION GAP: 7 (ref 5–15)
AST: 27 U/L (ref 15–41)
Albumin: 4.1 g/dL (ref 3.5–5.0)
Alkaline Phosphatase: 57 U/L (ref 38–126)
BILIRUBIN TOTAL: 0.9 mg/dL (ref 0.3–1.2)
BUN: 16 mg/dL (ref 8–23)
CO2: 29 mmol/L (ref 22–32)
Calcium: 9.8 mg/dL (ref 8.9–10.3)
Chloride: 103 mmol/L (ref 98–111)
Creatinine, Ser: 0.69 mg/dL (ref 0.44–1.00)
GFR calc Af Amer: >60 mL/min (ref >60)
Glucose, Bld: 99 mg/dL (ref 70–99)
POTASSIUM: 4.2 mmol/L (ref 3.5–5.1)
Sodium: 139 mmol/L (ref 135–145)
Total Protein: 6.7 g/dL (ref 6.5–8.1)

## 2018-08-26 ENCOUNTER — Other Ambulatory Visit: Payer: Self-pay | Admitting: Dermatology

## 2018-08-29 ENCOUNTER — Encounter (HOSPITAL_COMMUNITY): Payer: Self-pay | Admitting: Hematology

## 2018-08-29 ENCOUNTER — Inpatient Hospital Stay (HOSPITAL_COMMUNITY): Payer: Medicare Other

## 2018-08-29 ENCOUNTER — Other Ambulatory Visit: Payer: Self-pay

## 2018-08-29 ENCOUNTER — Inpatient Hospital Stay (HOSPITAL_COMMUNITY): Payer: Medicare Other | Admitting: Hematology

## 2018-08-29 VITALS — BP 134/73 | HR 58 | Temp 98.2°F | Resp 16 | Wt 169.0 lb

## 2018-08-29 DIAGNOSIS — C50912 Malignant neoplasm of unspecified site of left female breast: Secondary | ICD-10-CM

## 2018-08-29 DIAGNOSIS — M8580 Other specified disorders of bone density and structure, unspecified site: Secondary | ICD-10-CM | POA: Diagnosis not present

## 2018-08-29 DIAGNOSIS — Z170 Estrogen receptor positive status [ER+]: Secondary | ICD-10-CM | POA: Diagnosis not present

## 2018-08-29 MED ORDER — DENOSUMAB 60 MG/ML ~~LOC~~ SOSY
60.0000 mg | PREFILLED_SYRINGE | Freq: Once | SUBCUTANEOUS | Status: AC
  Administered 2018-08-29: 60 mg via SUBCUTANEOUS
  Filled 2018-08-29: qty 1

## 2018-08-29 NOTE — Assessment & Plan Note (Signed)
1.  Stage I left breast cancer: -Diagnosed in 2009, status post lumpectomy and radiation therapy -Tamoxifen followed by Arimidex completed in December 2014 -Today's physical examination did not reveal any abnormalities in the breast.  Left breast upper outer quadrant scar from lumpectomy is stable. - We reviewed mammogram dated 07/24/2018 which was BI-RADS Category 2. -We will arrange for mammogram at [place]. -She will be seen back in 1 year for follow-up.  2.  Osteopenia: - Last DEXA scan on 07/01/2017 shows T score of -1.6. -She is started on Prolia on 01/04/2014.  Last dose was 08/29/2018 and she is tolerating it well. - She will continue calcium and vitamin D supplements. -We will repeat DEXA scan in 1 year.  We will make a decision whether to continue Prolia at that point.

## 2018-08-29 NOTE — Progress Notes (Signed)
Angela [name] presents today for injection per the provider's orders.  Prolia administration without incident; see MAR for injection details.  Patient tolerated procedure well and without incident.  No questions or complaints noted at this time.  Discharged ambulatory.

## 2018-08-29 NOTE — Progress Notes (Signed)
Angela [name], [place] [address]   CLINIC:  Medical Oncology/Hematology  PCP:  Marin Olp, MD [address] [place] [address] [phone]   REASON FOR VISIT: Follow-up for Stage I invasive lobular carcinoma of (L) breast; ER+ AND Osteopenia  CURRENT THERAPY: Observation AND Prolia inj every 6 months.     INTERVAL HISTORY:  Angela [name] 72 y.o. female returns for routine follow-up for left breast cancer and osteopenia. She is doing well. She has recently seen her cardiologist due to chest discomfort. She will have a CT in January and follow back up with them. She has slight numbness and tingling in her toes. Denies any nausea, vomiting, or diarrhea. Denies any new pains or lumps present. Had not noticed any recent bleeding such as epistaxis, hematuria or hematochezia. Denies recent chest pain on exertion, shortness of breath on minimal exertion, pre-syncopal episodes, or palpitations. Denies any recent fevers, infections, or recent hospitalizations. She reports her appetite and energy level at 100%.      REVIEW OF SYSTEMS:  Review of Systems  Neurological: Positive for numbness.  All other systems reviewed and are negative.    PAST MEDICAL/SURGICAL HISTORY:  Past Medical History:  Diagnosis Date  . Arthritis   . BCC (basal cell carcinoma)   . Breast cancer ([facility]) 2009  . Cataract   . Hyperlipidemia   . Hypertension   . Macular retinal cyst of right eye   . OSA (obstructive sleep apnea)    CPAP  . Osteopenia   . Personal history of radiation therapy    Past Surgical History:  Procedure Laterality Date  . BREAST EXCISIONAL BIOPSY    . BREAST LUMPECTOMY Left 06/2008  . BREAST LUMPECTOMY    . CATARACT EXTRACTION  2012  . COLONOSCOPY    . Eyelid surgery Bilateral   . MASS EXCISION  03/2008   Appendix  . SKIN CANCER EXCISION    . TONSILLECTOMY       SOCIAL HISTORY:  Social History   Socioeconomic History  .  Marital status: Married    Spouse name: Not on file  . Number of children: 3  . Years of education: Not on file  . Highest education level: Not on file  Occupational History  . Occupation: retired    Fish farm manager: RETIRED    Comment: works 1 day a week  Social Needs  . Financial resource strain: Not on file  . Food insecurity:    Worry: Not on file    Inability: Not on file  . Transportation needs:    Medical: Not on file    Non-medical: Not on file  Tobacco Use  . Smoking status: Never Smoker  . Smokeless tobacco: Never Used  Substance and Sexual Activity  . Alcohol use: Yes    Alcohol/week: 3.0 standard drinks    Types: 3 Glasses of wine per week    Comment: one glass a week  . Drug use: No  . Sexual activity: Not on file  Lifestyle  . Physical activity:    Days per week: Not on file    Minutes per session: Not on file  . Stress: Not on file  Relationships  . Social connections:    Talks on phone: Not on file    Gets together: Not on file    Attends religious service: Not on file    Active member of club or organization: Not on file    Attends meetings of  clubs or organizations: Not on file    Relationship status: Not on file  . Intimate partner violence:    Fear of current or ex partner: Not on file    Emotionally abused: Not on file    Physically abused: Not on file    Forced sexual activity: Not on file  Other Topics Concern  . Not on file  Social History Narrative   Married. 2 daughters. 2 grandkids.       Retired from Materials engineer ed mostly [place]: travel, time with friends, eating out, lots of grandkids time in [place]:  Family History  Problem Relation Age of Onset  . Heart attack Mother   . Hypertension Mother   . Heart disease Father   . Heart attack Father   . Hypertension Father   . Colon cancer Neg Hx   . Stroke Neg Hx   . Esophageal cancer Neg Hx   . Rectal cancer Neg Hx   . Stomach cancer Neg Hx     CURRENT  MEDICATIONS:  Outpatient Encounter Medications as of 08/29/2018  Medication Sig Note  . aspirin 81 MG tablet Take 81 mg by mouth every evening.    . Calcium Carbonate-Vitamin D (CALTRATE 600+D) 600-400 MG-UNIT per tablet Take 2 tablets by mouth every evening.    Marland Kitchen co-enzyme Q-10 30 MG capsule Take 200 mg by mouth every evening.    . denosumab (PROLIA) 60 MG/ML SOLN injection Inject 60 mg into the skin every 6 (six) months.  12/10/2016: Due 01/2017  . fish oil-omega-3 fatty acids 1000 MG capsule Take 1 g by mouth every evening.    . hydrochlorothiazide (HYDRODIURIL) 25 MG tablet Take 1 tablet (25 mg total) by mouth daily.   . metoprolol succinate (TOPROL-XL) 50 MG 24 hr tablet TAKE 1 TABLET BY MOUTH ONCE DAILY WITH FOOD   . Multiple Vitamin (MULTIVITAMIN) tablet Take 1 tablet by mouth daily.     . potassium chloride SA (K-DUR,KLOR-CON) 20 MEQ tablet Take 1 tablet (20 mEq total) by mouth 2 (two) times daily.   . simvastatin (ZOCOR) 20 MG tablet TAKE 1 TABLET BY MOUTH AT BEDTIME   . vitamin C (ASCORBIC ACID) 500 MG tablet Take 500 mg by mouth every evening.    . nitroGLYCERIN (NITROSTAT) 0.4 MG SL tablet Place 1 tablet (0.4 mg total) under the tongue every 5 (five) minutes as needed for chest pain. (Patient not taking: Reported on 08/29/2018)   . [DISCONTINUED] Cholecalciferol (D 2000) 2000 UNITS TABS Take 1 tablet by mouth every evening.    No facility-administered encounter medications on file as of 08/29/2018.     ALLERGIES:  Allergies  Allergen Reactions  . Lisinopril Cough  . Fish-Derived Products Other (See Comments)    Myalgia with lipitor- unproven  . Statins Other (See Comments)    Myalgia with lipitor- unproven Myalgia with lipitor- unproven She is able to tolerate simvastatin     PHYSICAL EXAM:  ECOG Performance status: 1  Vitals:   08/29/18 0900  BP: 134/73  Pulse: (!) 58  Resp: 16  Temp: 98.2 F (36.8 C)  SpO2: 97%   Filed Weights   08/29/18 0900  Weight: 169 lb  (76.7 kg)    Physical Exam Constitutional:      Appearance: Normal appearance. She is normal weight.  Musculoskeletal: Normal range of motion.  Skin:    General: Skin is warm and dry.  Neurological:  General: No focal deficit present.     Mental Status: She is alert and oriented to person, place, and time. Mental status is at baseline.  Psychiatric:        Mood and Affect: Mood normal.        Behavior: Behavior normal.        Thought Content: Thought content normal.        Judgment: Judgment normal.   Breast: RIGHT: No palpable masses, no skin changes or nipple discharge, no adenopathy. LEFT: Lumpectomy site normal, No palpable masses, no skin changes, no adenopathy.    LABORATORY DATA:  I have reviewed the labs as listed.  CBC    Component Value Date/Time   WBC 9.2 08/18/2018 0224   RBC 4.29 08/18/2018 0224   HGB 13.3 08/18/2018 0224   HCT 40.6 08/18/2018 0224   PLT 230 08/18/2018 0224   MCV 94.6 08/18/2018 0224   MCH 31.0 08/18/2018 0224   MCHC 32.8 08/18/2018 0224   RDW 11.8 08/18/2018 0224   LYMPHSABS 2.2 02/19/2018 1204   MONOABS 0.7 02/19/2018 1204   EOSABS 0.1 02/19/2018 1204   BASOSABS 0.0 02/19/2018 1204   CMP Latest Ref Rng & Units 08/25/2018 08/18/2018 03/24/2018  Glucose 70 - 99 mg/dL 99 109(H) 97  BUN 8 - 23 mg/dL 16 20 19   Creatinine 0.44 - 1.00 mg/dL 0.69 0.65 0.73  Sodium 135 - 145 mmol/L 139 140 140  Potassium 3.5 - 5.1 mmol/L 4.2 3.1(L) 4.3  Chloride 98 - 111 mmol/L 103 105 104  CO2 22 - 32 mmol/L 29 28 29   Calcium 8.9 - 10.3 mg/dL 9.8 9.8 9.7  Total Protein 6.5 - 8.1 g/dL 6.7 - 6.5  Total Bilirubin 0.3 - 1.2 mg/dL 0.9 - 0.5  Alkaline Phos 38 - 126 U/L 57 - 58  AST 15 - 41 U/L 27 - 22  ALT 0 - 44 U/L 24 - 22       DIAGNOSTIC IMAGING:  I have independently reviewed the scans and discussed with the patient.   I have reviewed Francene Finders, NP's note and agree with the documentation.  I personally performed a face-to-face visit, made  revisions and my assessment and plan is as follows.    ASSESSMENT & PLAN:   Breast cancer 1.  Stage I left breast cancer: -Diagnosed in 2009, status post lumpectomy and radiation therapy -Tamoxifen followed by Arimidex completed in December 2014 -Today's physical examination did not reveal any abnormalities in the breast.  Left breast upper outer quadrant scar from lumpectomy is stable. - We reviewed mammogram dated 07/24/2018 which was BI-RADS Category 2. -We will arrange for mammogram at [facility]. -She will be seen back in 1 year for follow-up.  2.  Osteopenia: - Last DEXA scan on 07/01/2017 shows T score of -1.6. -She is started on Prolia on 01/04/2014.  Last dose was 08/29/2018 and she is tolerating it well. - She will continue calcium and vitamin D supplements. -We will repeat DEXA scan in 1 year.  We will make a decision whether to continue Prolia at that point.      Orders placed this encounter:  Orders Placed This Encounter  Procedures  . DG Bone Density      Derek Jack, MD [place] [phone]

## 2018-08-29 NOTE — Patient Instructions (Signed)
[facility] Discharge Instructions     Thank you for choosing [facility] at [facility] to provide your oncology and hematology care.  To afford each patient quality time with our provider, please arrive at least 15 minutes before your scheduled appointment time.   If you have a lab appointment with the Cancer Center please come in thru the  Main Entrance and check in at the main information desk  You need to re-schedule your appointment should you arrive 10 or more minutes late.  We strive to give you quality time with our providers, and arriving late affects you and other patients whose appointments are after yours.  Also, if you no show three or more times for appointments you may be dismissed from the clinic at the providers discretion.     Again, thank you for choosing [facility].  Our hope is that these requests will decrease the amount of time that you wait before being seen by our physicians.       _____________________________________________________________  Should you have questions after your visit to [facility], please contact our office at [phone] between the hours of 8:00 a.m. and 4:30 p.m.  Voicemails left after 4:00 p.m. will not be returned until the following business day.  For prescription refill requests, have your pharmacy contact our office and allow 72 hours.    Cancer Center Support Programs:   > Cancer Support Group  2nd Tuesday of the month 1pm-2pm, Journey Room    

## 2018-09-12 ENCOUNTER — Telehealth: Payer: Self-pay | Admitting: Cardiology

## 2018-09-12 NOTE — Telephone Encounter (Signed)
Patient called [facility] requesting to speak to someone about her upcoming CT procedure and lab orders.  Please call the cell #

## 2018-09-12 NOTE — Telephone Encounter (Signed)
Pt called to see if she would have to have labs done before CT. CMET completed on 08/26/18

## 2018-09-26 ENCOUNTER — Telehealth (HOSPITAL_COMMUNITY): Payer: Self-pay | Admitting: Emergency Medicine

## 2018-09-26 NOTE — Telephone Encounter (Signed)
Left message on voicemail with name and callback number Ania Levay RN Navigator Cardiac [facility] [phone] Office [phone] Cell  

## 2018-09-29 ENCOUNTER — Ambulatory Visit (HOSPITAL_COMMUNITY): Admission: RE | Admit: 2018-09-29 | Payer: Medicare Other | Source: Ambulatory Visit

## 2018-09-29 ENCOUNTER — Ambulatory Visit (HOSPITAL_COMMUNITY)
Admission: RE | Admit: 2018-09-29 | Discharge: 2018-09-29 | Disposition: A | Discharge status: Home / Self Care | Payer: Medicare Other | Source: Ambulatory Visit | Attending: Cardiology | Admitting: Cardiology

## 2018-09-29 DIAGNOSIS — Z8249 Family history of ischemic heart disease and other diseases of the circulatory system: Secondary | ICD-10-CM

## 2018-09-29 DIAGNOSIS — R0789 Other chest pain: Secondary | ICD-10-CM

## 2018-09-29 MED ORDER — NITROGLYCERIN 0.4 MG SL SUBL
SUBLINGUAL_TABLET | SUBLINGUAL | Status: AC
  Filled 2018-09-29: qty 2

## 2018-09-29 MED ORDER — IOPAMIDOL (ISOVUE-370) INJECTION 76%
80.0000 mL | Freq: Once | INTRAVENOUS | Status: AC | PRN
  Administered 2018-09-29: 80 mL via INTRAVENOUS

## 2018-09-29 MED ORDER — NITROGLYCERIN 0.4 MG SL SUBL
0.8000 mg | SUBLINGUAL_TABLET | Freq: Once | SUBLINGUAL | Status: AC
  Administered 2018-09-29: 0.8 mg via SUBLINGUAL
  Filled 2018-09-29: qty 25

## 2018-09-30 ENCOUNTER — Ambulatory Visit: Payer: Medicare Other | Admitting: Family Medicine

## 2018-09-30 ENCOUNTER — Encounter: Payer: Self-pay | Admitting: Family Medicine

## 2018-09-30 VITALS — BP 122/78 | HR 54 | Temp 97.6°F | Ht 66.0 in | Wt 173.4 lb

## 2018-09-30 DIAGNOSIS — R739 Hyperglycemia, unspecified: Secondary | ICD-10-CM

## 2018-09-30 DIAGNOSIS — R0789 Other chest pain: Secondary | ICD-10-CM | POA: Diagnosis not present

## 2018-09-30 DIAGNOSIS — E785 Hyperlipidemia, unspecified: Secondary | ICD-10-CM

## 2018-09-30 DIAGNOSIS — I10 Essential (primary) hypertension: Secondary | ICD-10-CM | POA: Diagnosis not present

## 2018-09-30 DIAGNOSIS — R7309 Other abnormal glucose: Secondary | ICD-10-CM

## 2018-09-30 DIAGNOSIS — Z6827 Body mass index (BMI) 27.0-27.9, adult: Secondary | ICD-10-CM

## 2018-09-30 LAB — POCT GLYCOSYLATED HEMOGLOBIN (HGB A1C): HEMOGLOBIN A1C: 5.3 % (ref 4.0–5.6)

## 2018-09-30 MED ORDER — SIMVASTATIN 40 MG PO TABS: 40.0000 mg | ORAL_TABLET | Freq: Every day | ORAL | 3 refills | Status: DC

## 2018-09-30 NOTE — Progress Notes (Signed)
Subjective:  DANELLE CURIALE is a 73 y.o. year old very pleasant female patient who presents for/with See problem oriented charting ROS-no chest pain or shortness of breath at this time-did have some over the last month.  Has had some weight gain-recently went on a cruise.  No edema reported.  Past Medical History-  Patient Active Problem List   Diagnosis Date Noted  . History of breast cancer 03/20/2011    Priority: High  . OSA (obstructive sleep apnea) 08/24/2015    Priority: Medium  . Hyperglycemia 11/12/2014    Priority: Medium  . Osteopenia determined by x-ray 06/04/2014    Priority: Medium  . HTN (hypertension) 07/13/2008    Priority: Medium  . Hyperlipidemia 07/09/2007    Priority: Medium  . History of skin cancer 02/21/2016    Priority: Low  . Chest discomfort 10/05/2014    Priority: Low  . 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 Vitamin D deficiency 07/09/2007    Priority: Low  . History of colon polyps 06/12/2018  . Chest pain 12/21/2011    Medications- reviewed and updated Current Outpatient Medications  Medication Sig Dispense Refill  . aspirin 81 MG tablet Take 81 mg by mouth every evening.     . Calcium Carbonate-Vitamin D (CALTRATE 600+D) 600-400 MG-UNIT per tablet Take 2 tablets by mouth every evening.     Marland Kitchen co-enzyme Q-10 30 MG capsule Take 200 mg by mouth every evening.     . denosumab (PROLIA) 60 MG/ML SOLN injection Inject 60 mg into the skin every 6 (six) months.     . fish oil-omega-3 fatty acids 1000 MG capsule Take 1 g by mouth every evening.     . hydrochlorothiazide (HYDRODIURIL) 25 MG tablet Take 1 tablet (25 mg total) by mouth daily. 90 tablet 3  . metoprolol succinate (TOPROL-XL) 50 MG 24 hr tablet TAKE 1 TABLET BY MOUTH ONCE DAILY WITH FOOD 90 tablet 2  . Multiple Vitamin (MULTIVITAMIN) tablet Take 1 tablet by mouth daily.      . nitroGLYCERIN (NITROSTAT) 0.4 MG SL tablet Place 1 tablet (0.4 mg total) under the tongue every 5 (five)  minutes as needed for chest pain. 25 tablet 6  . potassium chloride SA (K-DUR,KLOR-CON) 20 MEQ tablet Take 1 tablet (20 mEq total) by mouth 2 (two) times daily. 8 tablet 0  . simvastatin (ZOCOR) 20 MG tablet TAKE 1 TABLET BY MOUTH AT BEDTIME 90 tablet 2  . vitamin C (ASCORBIC ACID) 500 MG tablet Take 500 mg by mouth every evening.      Objective: BP 122/78 (BP Location: Left Arm, Patient Position: Sitting, Cuff Size: Normal)   Pulse (!) 54   Temp 97.6 F (36.4 C) (Oral)   Ht 5\' 6"  (1.676 m)   Wt 173 lb 6.4 oz (78.7 kg)   LMP  (LMP Unknown)   SpO2 96%   BMI 27.99 kg/m  Gen: NAD, resting comfortably CV: RRR no murmurs rubs or gallops Lungs: CTAB no crackles, wheeze, rhonchi Abdomen: soft/nontender/nondistended/overweight Ext: no edema Skin: warm, dry  Assessment/Plan:   Hypertension  s: controlled on hydrochlorothiazide 25 mg, metoprolol 50 mg standard release.  Also has to take potassium due to hydrochlorothiazide. BP Readings from Last 3 Encounters:  09/30/18 122/78  09/29/18 132/71  08/29/18 134/73  A/P: Stable at blood pressure goal of <140/90. Continue current meds   Hyperlipidemia recent atypical chest pain-an elevated coronary CT calcium score s:  controlled on simvastatin 20 mg with last  LDL at 99 in July 2019.  Reports compliance. She is taking coq10 and that seems to help with myalgias  Unfortunately patient developed chest pain within the last month-went to the emergency room.  Later had coronary CT scan-coronary CT scan shows age and sex matched that patient has a 92nd percentile with possible 50-70 percent blockage of the LAD  A/P: I reviewed the coronary CT with patient.  I told her that cardiology would likely pursue catheterization versus maximizing medical management but I lean toward them likely doing catheterization.  We will increase her simvastatin to 40 mg-I discussed going to atorvastatin 40 mg but she has done well on simvastatin and wants to trial the  higher dose first to see if she can get LDL goal of 70 or less   Hyperglycemia/elevated BMI s: Weight is up 6 pounds from last visit.  Difficult time with the holidays. And just got back from a cruise Lab Results  Component Value Date   HGBA1C 6.0 03/24/2018   HGBA1C 5.5 10/29/2017   HGBA1C 5.9 03/22/2017   A/P: a1c actually down some to 5.3 today (will be loaded)- I question with weight gain accuracy of this- will draw phlebotomy next visit. We discussed reversing weight trend-her goal is to lose 6 pounds by follow-up in 6 months if not more.  We will recheck an A1c at that time   Future Appointments  Date Time Provider [place]  03/02/2019 10:30 AM [facility] LAB [facility] None  03/02/2019 11:00 AM [facility] INJ NURSE [facility] None  04/15/2019  9:20 AM Marin Olp, MD [facility]  08/26/2019 10:30 AM AP-DG DEXA [facility] [place] [facility]  08/26/2019 11:10 AM [facility] LAB [facility] None  08/28/2019  8:15 AM Derek Jack, MD [facility] None   Return in about 6 months (around 03/31/2019) for physical.  Lab/Order associations: Essential hypertension  Hyperlipidemia, unspecified hyperlipidemia type  Hyperglycemia  Chest discomfort  Elevated glucose - Plan: POCT glycosylated hemoglobin (Hb A1C)  BMI 27.0-27.9,adult  Meds ordered this encounter  Medications  . simvastatin (ZOCOR) 40 MG tablet    Sig: Take 1 tablet (40 mg total) by mouth at bedtime.    Dispense:  90 tablet    Refill:  3   Return precautions advised.  Garret Reddish, MD

## 2018-09-30 NOTE — Patient Instructions (Signed)
Take 2 of your simvastatin 20mg  tablets until you run out then take 40mg . Continue coq10. Cardiology may try to push for stronger dose- please remind them of prior issues with muscle aches so that's why Im trying to be cautious about your increase  We will look to hear from Dr. Domenic Polite for other planning  Continue to work on healthy eating, regular exercise with goal of at least trimming the 6 extra pounds back off from holidays

## 2018-10-01 ENCOUNTER — Telehealth: Payer: Self-pay

## 2018-10-01 ENCOUNTER — Ambulatory Visit: Payer: Medicare Other | Admitting: Cardiology

## 2018-10-01 ENCOUNTER — Encounter: Payer: Self-pay | Admitting: Cardiology

## 2018-10-01 VITALS — BP 150/84 | HR 72 | Ht 66.5 in | Wt 170.0 lb

## 2018-10-01 DIAGNOSIS — I25119 Atherosclerotic heart disease of native coronary artery with unspecified angina pectoris: Secondary | ICD-10-CM | POA: Diagnosis not present

## 2018-10-01 DIAGNOSIS — E782 Mixed hyperlipidemia: Secondary | ICD-10-CM

## 2018-10-01 DIAGNOSIS — I10 Essential (primary) hypertension: Secondary | ICD-10-CM | POA: Diagnosis not present

## 2018-10-01 MED ORDER — ISOSORBIDE MONONITRATE ER 30 MG PO TB24: 30.0000 mg | ORAL_TABLET | Freq: Every day | ORAL | 3 refills | Status: DC

## 2018-10-01 NOTE — Telephone Encounter (Signed)
-----   Message from Satira Sark, MD sent at 09/30/2018  6:31 PM EST ----- Results reviewed. Please make sure she has a follow-up visit soon to discuss this and the next step. A copy of this test should be forwarded to Marin Olp, MD.

## 2018-10-01 NOTE — Telephone Encounter (Signed)
Spoke with pt. Made her aware she will need follow up to discuss CT results. Appt made for 10/01/2018.

## 2018-10-01 NOTE — Progress Notes (Signed)
Cardiology Office Note  Date: 10/01/2018   ID: Angela [name] 1946-08-27, MRN [id number]  PCP: Marin Olp, MD  Primary Cardiologist: Rozann Lesches, MD   Chief Complaint  Patient presents with  . Coronary Artery Disease     History of Present Illness: Angela [name] is a 73 y.o. female last seen in December 2019 and presenting now for office follow-up.  She was referred for follow-up ischemic evaluation in light of recurring chest discomfort with both typical and atypical features.  Previous [employer] from May 2018 was low risk and negative for ischemia.  She recently underwent a cardiac CT angiogram that demonstrated a moderate mid LAD stenosis (50 to 70%) with borderline positive FFR of 0.78, also calcium score of 566 consistent with high risk for future cardiac events.  We went over the results of the recent testing in detail and discussed options for management.  She tells me that she and her husband just recently got back from a [place] cruise.  She does not indicate any progressive chest pain during that time, in fact was walking up 5 flights of steps for exercise, short of breath at the top but without chest pain.  He recently had her lipids checked by PCP with resulting increase in Zocor to 40 mg daily following LDL of 99.  She is also on aspirin, beta-blocker, and has as needed nitroglycerin available.  Discussed options for cardiac catheterization and assessment for PCI depending on symptoms and also rationale for medical therapy.  Past Medical History:  Diagnosis Date  . Arthritis   . BCC (basal cell carcinoma)   . Breast cancer ([place]) 2009  . Cataract   . Hyperlipidemia   . Hypertension   . Macular retinal cyst of right eye   . OSA (obstructive sleep apnea)    CPAP  . Osteopenia   . Personal history of radiation therapy     Past Surgical History:  Procedure Laterality Date  . BREAST EXCISIONAL BIOPSY    . BREAST LUMPECTOMY Left 06/2008  .  BREAST LUMPECTOMY    . CATARACT EXTRACTION  2012  . COLONOSCOPY    . Eyelid surgery Bilateral   . MASS EXCISION  03/2008   Appendix  . SKIN CANCER EXCISION    . TONSILLECTOMY      Current Outpatient Medications  Medication Sig Dispense Refill  . aspirin 81 MG tablet Take 81 mg by mouth every evening.     . Calcium Carbonate-Vitamin D (CALTRATE 600+D) 600-400 MG-UNIT per tablet Take 2 tablets by mouth every evening.     Marland Kitchen co-enzyme Q-10 30 MG capsule Take 200 mg by mouth every evening.     . denosumab (PROLIA) 60 MG/ML SOLN injection Inject 60 mg into the skin every 6 (six) months.     . fish oil-omega-3 fatty acids 1000 MG capsule Take 1 g by mouth every evening.     . hydrochlorothiazide (HYDRODIURIL) 25 MG tablet Take 1 tablet (25 mg total) by mouth daily. 90 tablet 3  . metoprolol succinate (TOPROL-XL) 50 MG 24 hr tablet TAKE 1 TABLET BY MOUTH ONCE DAILY WITH FOOD 90 tablet 2  . Multiple Vitamin (MULTIVITAMIN) tablet Take 1 tablet by mouth daily.      . nitroGLYCERIN (NITROSTAT) 0.4 MG SL tablet Place 1 tablet (0.4 mg total) under the tongue every 5 (five) minutes as needed for chest pain. 25 tablet 6  . potassium chloride SA (K-DUR,KLOR-CON) 20 MEQ tablet Take 1 tablet (  20 mEq total) by mouth 2 (two) times daily. 8 tablet 0  . simvastatin (ZOCOR) 40 MG tablet Take 1 tablet (40 mg total) by mouth at bedtime. 90 tablet 3  . vitamin C (ASCORBIC ACID) 500 MG tablet Take 500 mg by mouth every evening.     . isosorbide mononitrate (IMDUR) 30 MG 24 hr tablet Take 1 tablet (30 mg total) by mouth daily. 90 tablet 3   No current facility-administered medications for this visit.    Allergies:  Lisinopril and Statins   Social History: The patient  reports that she has never smoked. She has never used smokeless tobacco. She reports current alcohol use of about 3.0 standard drinks of alcohol per week. She reports that she does not use drugs.   ROS:  Please see the history of present illness.  Otherwise, complete review of systems is positive for none.  All other systems are reviewed and negative.   Physical Exam: VS:  BP (!) 150/84 (BP Location: Right Arm)   Pulse 72   Ht 5' 6.5" (1.689 m)   Wt 170 lb (77.1 kg)   LMP  (LMP Unknown)   SpO2 95%   BMI 27.03 kg/m , BMI Body mass index is 27.03 kg/m.  Wt Readings from Last 3 Encounters:  10/01/18 170 lb (77.1 kg)  09/30/18 173 lb 6.4 oz (78.7 kg)  08/29/18 169 lb (76.7 kg)    General: Patient appears comfortable at rest. HEENT: Conjunctiva and lids normal, oropharynx clear. Neck: Supple, no elevated JVP or carotid bruits, no thyromegaly. Lungs: Clear to auscultation, nonlabored breathing at rest. Cardiac: Regular rate and rhythm, no S3 or significant systolic murmur, no pericardial rub. Abdomen: Soft, nontender, bowel sounds present. Extremities: No pitting edema, distal pulses 2+. Skin: Warm and dry. Musculoskeletal: No kyphosis. Neuropsychiatric: Alert and oriented x3, affect grossly appropriate.  ECG: I personally reviewed the tracing from 08/18/2018 which showed sinus rhythm.   Recent Labwork: 08/18/2018: B Natriuretic Peptide 43.0; Hemoglobin 13.3; Platelets 230 08/25/2018: ALT 24; AST 27; BUN 16; Creatinine, Ser 0.69; Potassium 4.2; Sodium 139     Component Value Date/Time   CHOL 169 03/24/2018 0811   CHOL 169 01/14/2017 0835   TRIG 162.0 (H) 03/24/2018 0811   HDL 37.50 (L) 03/24/2018 0811   HDL 34 (L) 01/14/2017 0835   CHOLHDL 4 03/24/2018 0811   VLDL 32.4 03/24/2018 0811   LDLCALC 99 03/24/2018 0811   LDLCALC 90 01/14/2017 0835   LDLDIRECT 170.3 07/09/2013 0813    Other Studies Reviewed Today:  Cardiac CTA 09/29/2018: FINDINGS: Non-cardiac: See separate report from [facility].  Pulmonary veins drain normally to the left atrium.  Calcium Score: 566 Agatston units.  Coronary Arteries: Right dominant with no anomalies  LM: Mixed plaque with minimal stenosis proximal left main.  LAD  system: Mixed plaque in the proximal LAD, possible moderate (50-70%) stenosis.  Circumflex system: Calcified plaque proximal LCx without significant stenosis.  RCA system: Calcified plaque with mild (<50%) stenosis in the proximal RCA.  IMPRESSION: 1. Coronary artery calcium score 566 Agatston units. This places the patient in the 92nd percentile for age and gender, suggesting high risk for future cardiac events.  2. Possible moderate (50-70%) stenosis in the mid LAD. Will send for FFR.  IMPRESSION: 1. Scarring and thickening of the minor fissure in the right lung and scarring at the left lung base. 2. Small to moderate-sized hiatal hernia.  ADDENDUM: CT FFR was done.  FFR 0.78 distal LAD. This suggests a  borderline hemodynamically significant mid LAD stenosis.  Assessment and Plan:  1.  CAD as outlined above with moderate mid LAD stenosis and borderline positive CTA FFR.  We discussed options for management.  She describes good functional capacity without accelerating symptoms at this point.  Plan will be to continue aspirin, increased dose Zocor, beta-blocker, and as needed nitroglycerin.  We will also add Imdur and titrate from 15 mg daily to 30 mg daily over the course of 2 weeks.  We will then bring her back to the office for reassessment.  2.  Mixed hyperlipidemia, most recent LDL 99.  Agree with increasing Zocor to 40 mg daily.  Will discuss repeat FLP in the next 8 to 12 weeks.  Would like to get LDL below 70 if possible.  3.  Essential hypertension, continue with antihypertensive regimen.  Current medicines were reviewed with the patient today.  Disposition: Follow-up in 8 weeks.  Signed, Satira Sark, MD, [facility] 10/01/2018 4:02 PM    [facility] at [facility] [address] [place], [place] [address] Phone: [phone]; Fax: [phone]

## 2018-10-01 NOTE — Patient Instructions (Signed)
Medication Instructions:  Start imdur 15 mg daily for 2 weeks then increase to 30 mg daily after that (if you are able to tolerate medication)  Labwork: none  Testing/Procedures: none  Follow-Up: Your physician recommends that you schedule a follow-up appointment in: 2 months    Any Other Special Instructions Will Be Listed Below (If Applicable).     If you need a refill on your cardiac medications before your next appointment, please call your pharmacy.

## 2018-10-16 NOTE — Telephone Encounter (Signed)
Thank you for the update.  It was not wrong for you to take the Imdur 15 mg in the morning and the evening, but if you can tolerate taking 1 tablet at 30 mg once in the morning that would be easier overall.

## 2018-10-17 ENCOUNTER — Telehealth: Payer: Self-pay | Admitting: Cardiology

## 2018-10-17 NOTE — Telephone Encounter (Signed)
Returned pt call. Pt states that she notice a red patch the size of her hand on the side of her knee. She said it does not itch, but is warm to the touch. Advised her to follow up with her pcp as she could have gotten bitten by something while she was asleep. She voiced understanding of plan.

## 2018-10-17 NOTE — Telephone Encounter (Signed)
Patient states that she was started on Imdur now has a large circle around her knee. Would like to speak with nurse to see if this is reaction to medicince. / tg

## 2018-11-03 NOTE — Telephone Encounter (Signed)
Thank you for the update.  It is okay for you to use short acting nitroglycerin while you are on Imdur.  I would not describe your angina is unstable, however some symptoms are atypical in pattern.  Rather than high-dose aspirin, I do think that Tylenol would be a safer option.  From a cardiac perspective, aspirin 81 mg daily should be sufficient.

## 2018-11-18 NOTE — Telephone Encounter (Signed)
That is a very reasonable question.  Although [place] is not a high risk territory at this time and there are no travel restrictions now, this is obviously a moving target.  Since you are both over 65 and therefore in a higher risk category, I think it would be very reasonable to think about booking your trip at a later time.

## 2018-12-03 ENCOUNTER — Ambulatory Visit: Payer: Medicare Other | Admitting: Cardiology

## 2018-12-09 ENCOUNTER — Ambulatory Visit: Payer: Medicare Other | Admitting: Cardiology

## 2018-12-15 NOTE — Telephone Encounter (Signed)
Thank you for the update Ms. Speelman.  I will review the form the next time I am back in the [place] office.

## 2018-12-29 ENCOUNTER — Other Ambulatory Visit: Payer: Self-pay | Admitting: Family Medicine

## 2018-12-29 DIAGNOSIS — I10 Essential (primary) hypertension: Secondary | ICD-10-CM

## 2018-12-29 DIAGNOSIS — R079 Chest pain, unspecified: Secondary | ICD-10-CM

## 2018-12-30 NOTE — Telephone Encounter (Signed)
Last OV 09/30/18 Last refill 10/10/17 #90/3 Next OV 04/15/19

## 2019-01-02 ENCOUNTER — Other Ambulatory Visit: Payer: Self-pay | Admitting: Family Medicine

## 2019-01-02 NOTE — Telephone Encounter (Signed)
Last OV 09/30/18 Last refill 04/23/18 #90/2 Next OV 04/15/19

## 2019-01-05 NOTE — Telephone Encounter (Signed)
Why don't we keep the visit on May 6 and do a telehealth encounter.  We can go over the medications and symptoms in more detail.

## 2019-01-06 ENCOUNTER — Ambulatory Visit: Payer: Medicare Other | Admitting: Family Medicine

## 2019-01-06 ENCOUNTER — Encounter: Payer: Self-pay | Admitting: Family Medicine

## 2019-01-06 ENCOUNTER — Ambulatory Visit (INDEPENDENT_AMBULATORY_CARE_PROVIDER_SITE_OTHER): Payer: Medicare Other | Admitting: Family Medicine

## 2019-01-06 ENCOUNTER — Ambulatory Visit: Payer: Self-pay | Admitting: *Deleted

## 2019-01-06 VITALS — BP 128/78 | HR 68 | Temp 97.0°F | Ht 66.5 in | Wt 166.0 lb

## 2019-01-06 DIAGNOSIS — Z7189 Other specified counseling: Secondary | ICD-10-CM | POA: Diagnosis not present

## 2019-01-06 DIAGNOSIS — J029 Acute pharyngitis, unspecified: Secondary | ICD-10-CM

## 2019-01-06 MED ORDER — NYSTATIN 100000 UNIT/ML MT SUSP: 5.0000 mL | Freq: Four times a day (QID) | OROMUCOSAL | 0 refills | Status: DC

## 2019-01-06 NOTE — Telephone Encounter (Signed)
Pt reports sore throat, right sided only, onset 4 days ago. Afebrile, denies cough, no sinus tenderness. Pt states unable to visualize throat. Only other symptom reports "Sneezing 2-3 times a day." States 8/10 when swallowing. Has been taking Cold-EZe, zinc. Pt's Email and phone # verified. Call transferred to practice, [facility], for consideration of appt. Care advise given, verbalizes understanding.   Reason for Disposition . [1] Sore throat is the only symptom AND [2] present > 48 hours  Answer Assessment - Initial Assessment Questions 1. ONSET: "When did the throat start hurting?" (Hours or days ago)      3-4 days ago 2. SEVERITY: "How bad is the sore throat?" (Scale 1-10; mild, moderate or severe)   - MILD (1-3):  doesn't interfere with eating or normal activities   - MODERATE (4-7): interferes with eating some solids and normal activities   - SEVERE (8-10):  excruciating pain, interferes with most normal activities   - SEVERE DYSPHAGIA: can't swallow liquids, drooling     8/10 when swallowing 3. STREP EXPOSURE: "Has there been any exposure to strep within the past week?" If so, ask: "What type of contact occurred?"     no 4.  VIRAL SYMPTOMS: "Are there any symptoms of a cold, such as a runny nose, cough, hoarse voice or red eyes?"      Sneezing 2-3 times a day 5. FEVER: "Do you have a fever?" If so, ask: "What is your temperature, how was it measured, and when did it start?"     no 6. PUS ON THE TONSILS: "Is there pus on the tonsils in the back of your throat?"     Not sure 7. OTHER SYMPTOMS: "Do you have any other symptoms?" (e.g., difficulty breathing, headache, rash)     none  Protocols used: SORE THROAT-A-AH

## 2019-01-06 NOTE — Telephone Encounter (Signed)
Forwarding to Dr. Hunter as FYI.  

## 2019-01-06 NOTE — Telephone Encounter (Signed)
[name], schedule virtual visit to be seen

## 2019-01-06 NOTE — Progress Notes (Signed)
Phone [phone]   Subjective:  Virtual visit via Video note. Chief complaint: Chief Complaint  Patient presents with  . Sore Throat    x 4days    This visit type was conducted due to national recommendations for restrictions regarding the COVID-19 Pandemic (e.g. social distancing).  This format is felt to be most appropriate for this patient at this time balancing risks to patient and risks to population by having him in for in person visit.  No physical exam was performed (except for noted visual exam or audio findings with Telehealth visits).    Our team/I connected with [name] on 01/06/19 at  2:20 PM EDT by a video enabled telemedicine application ([email or web address]) and verified that I am speaking with the correct person using two identifiers.  Location patient: [facility] Location provider: [facility], office Persons participating in the virtual visit:  patient, husband- who helps with lighting to look in throat  Our team/I discussed the limitations of evaluation and management by telemedicine and the availability of in person appointments. In light of current covid-19 pandemic, patient also understands that we are trying to protect them by minimizing in office contact if at all possible.  The patient expressed consent for telemedicine visit and agreed to proceed. Patient understands insurance will be billed.   ROS- no fever/chills/cough. Some sneezing. No watery itchy eyes. Some chest discomfort- has been working with cardiology.   Past Medical History-  Patient Active Problem List   Diagnosis Date Noted  . History of breast cancer 03/20/2011    Priority: High  . OSA (obstructive sleep apnea) 08/24/2015    Priority: Medium  . Hyperglycemia 11/12/2014    Priority: Medium  . Osteopenia determined by x-ray 06/04/2014    Priority: Medium  . HTN (hypertension) 07/13/2008    Priority: Medium  . Hyperlipidemia 07/09/2007    Priority: Medium  . History of skin cancer 02/21/2016   Priority: Low  . Chest discomfort 10/05/2014    Priority: Low  . 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 Vitamin D deficiency 07/09/2007    Priority: Low  . History of colon polyps 06/12/2018  . Chest pain 12/21/2011    Medications- reviewed and updated Current Outpatient Medications  Medication Sig Dispense Refill  . aspirin 81 MG tablet Take 81 mg by mouth every evening.     . Calcium Carbonate-Vitamin D (CALTRATE 600+D) 600-400 MG-UNIT per tablet Take 2 tablets by mouth every evening.     Marland Kitchen co-enzyme Q-10 30 MG capsule Take 200 mg by mouth every evening.     . denosumab (PROLIA) 60 MG/ML SOLN injection Inject 60 mg into the skin every 6 (six) months.     . fish oil-omega-3 fatty acids 1000 MG capsule Take 1 g by mouth every evening.     . hydrochlorothiazide (HYDRODIURIL) 25 MG tablet TAKE 1 TABLET BY MOUTH ONCE DAILY 90 tablet 1  . metoprolol succinate (TOPROL-XL) 50 MG 24 hr tablet TAKE 1 TABLET BY MOUTH ONCE DAILY WITH FOOD 90 tablet 1  . Multiple Vitamin (MULTIVITAMIN) tablet Take 1 tablet by mouth daily.      . potassium chloride SA (K-DUR,KLOR-CON) 20 MEQ tablet Take 1 tablet (20 mEq total) by mouth 2 (two) times daily. 8 tablet 0  . simvastatin (ZOCOR) 40 MG tablet Take 1 tablet (40 mg total) by mouth at bedtime. 90 tablet 3  . vitamin C (ASCORBIC ACID) 500 MG tablet Take 500 mg by mouth every evening.     Marland Kitchen  isosorbide mononitrate (IMDUR) 30 MG 24 hr tablet Take 1 tablet (30 mg total) by mouth daily. 90 tablet 3  . nitroGLYCERIN (NITROSTAT) 0.4 MG SL tablet Place 1 tablet (0.4 mg total) under the tongue every 5 (five) minutes as needed for chest pain. 25 tablet 6  . nystatin (MYCOSTATIN) 100000 UNIT/ML suspension Take 5 mLs (500,000 Units total) by mouth 4 (four) times daily. Swish and spit - over 6 days 120 mL 0   No current facility-administered medications for this visit.      Objective:  BP 128/78 (BP Location: Right Arm, Patient Position: Sitting, Cuff  Size: Normal)   Pulse 68   Temp (!) 97 F (36.1 C) (Oral)   Ht 5' 6.5" (1.689 m)   Wt 166 lb (75.3 kg)   LMP  (LMP Unknown)   BMI 26.39 kg/m  Gen: NAD, resting comfortably Front of tongue appears normal and moist- I have a difficult time visualizing the pharynx and back of the tongue. Lungs: nonlabored, normal respiratory rate  Skin: appears dry, no obvious rash Normal speech, walks around her house without difficulty    Assessment and Plan   #Sore throat/COVID-19 education S:Sore throat has been going on for about 4 days. States it feels raw and is more on the right hand side. Usually if she has issues its on both sides of her throat. Not hurting unless she swallows and then it really hurts- pain up to 6-7/10 with swallowing. At rest with tylenol down to 0   Her husband looked in her throat- 3 small yellow bumps on pharynx. She has had a tonsilectomy as a child.  A/P: Sore throat with reported white/yellowish discoloration on tongue and pharynx- potential thrush (though I cannot visualize well by video visit husband is able to see this well)-we will treat with nystatin swish/gargle and spit.  Also in case there is an allergic element-she will add Claritin or Allegra generic version nightly for the next few weeks.  She asks about potential bacterial infection-no known strep exposure so viral pharyngitis would be more likely plus the whitish discoloration makes me lean toward possible thrush-we did discuss if she fails to improve or symptoms worsen that we would consider a course of Augmentin (would consider this through mychart message) -Afebrile and well appearing- doubt retropharyngeal abscess  Unfortunately, we cannot exclude covid-19 as potential cause of symptoms though I do not think it is a strong likelihood Therefore: - recommended patient watch closely for shortness of breath or confusion or worsening symptoms and if those occur he should contact us immediately -recommended self  isolation for 7 days PLUS symptom free for at least 3 days   Other notes: 1.has pretty much been home bound- grocery store every other week but using masks and gloves   Future Appointments  Date Time Provider [place]  01/14/2019  1:40 PM Satira Sark, MD [facility]  03/02/2019 10:30 AM [facility] LAB [facility] None  03/02/2019 11:00 AM [facility] INJ NURSE [facility] None  04/15/2019  9:20 AM Marin Olp, MD [facility]  08/26/2019 10:30 AM [facility] DEXA [facility]  08/26/2019 11:10 AM [facility] LAB [facility] None  08/28/2019  8:15 AM Derek Jack, MD [facility] None   Lab/Order associations: Sore throat  Educated About Covid-19 Virus Infection  Meds ordered this encounter  Medications  . nystatin (MYCOSTATIN) 100000 UNIT/ML suspension    Sig: Take 5 mLs (500,000 Units total) by mouth 4 (four) times daily. Swish and spit - over  6 days    Dispense:  120 mL    Refill:  0    Return precautions advised.  Garret Reddish, MD

## 2019-01-06 NOTE — Patient Instructions (Addendum)
Video visit

## 2019-01-06 NOTE — Telephone Encounter (Signed)
See note. Patient has appointment this afternoon.  °

## 2019-01-12 ENCOUNTER — Encounter: Payer: Self-pay | Admitting: Family Medicine

## 2019-01-12 NOTE — Telephone Encounter (Signed)
Please see message . Thank you .

## 2019-01-13 ENCOUNTER — Telehealth: Payer: Self-pay | Admitting: Cardiology

## 2019-01-13 NOTE — Progress Notes (Signed)
Virtual Visit via Video Note   This visit type was conducted due to national recommendations for restrictions regarding the COVID-19 Pandemic (e.g. social distancing) in an effort to limit this patient's exposure and mitigate transmission in our community.  Due to her co-morbid illnesses, this patient is at least at moderate risk for complications without adequate follow up.  This format is felt to be most appropriate for this patient at this time.  All issues noted in this document were discussed and addressed.  A limited physical exam was performed with this format.  Please refer to the patient's chart for her consent to telehealth for [facility].   Date:  01/14/2019   ID:  Angela [name], DOB 04/10/1946, MRN [id number]  Patient Location: Home Provider Location: Office  PCP:  Marin Olp, MD  Cardiologist:  Rozann Lesches, MD  Evaluation Performed:  Follow-Up Visit  Chief Complaint:  Follow-up CAD  History of Present Illness:    Angela [name] is a 73 y.o. female last seen in January.  We communicated via video conferencing today.  Her husband was also present.  She does report intermittent episodes of chest discomfort, not always specifically with exertion however.  Some are more sporadic.  She does use sublingual nitroglycerin with relief.  She has also had benefit in general since we started Imdur.  She also has a hiatal hernia with intermittent reflux and it could be that some of the symptoms are GI in etiology.  I did talk with her about the possibility of considering a 2-week course of over-the-counter Prilosec to see if it made any difference as well.  She has continued on medical therapy for CAD documented by coronary artery CT angiography.  She is currently on stable medical therapy as detailed below.  She is walking a mile a day with her husband.  The patient does not have symptoms concerning for COVID-19 infection (fever, chills, cough, or new shortness of breath).     Past Medical History:  Diagnosis Date  . Arthritis   . BCC (basal cell carcinoma)   . Breast cancer ([place]) 2009  . Cataract   . Hyperlipidemia   . Hypertension   . Macular retinal cyst of right eye   . OSA (obstructive sleep apnea)    CPAP  . Osteopenia   . Personal history of radiation therapy    Past Surgical History:  Procedure Laterality Date  . BREAST EXCISIONAL BIOPSY    . BREAST LUMPECTOMY Left 06/2008  . BREAST LUMPECTOMY    . CATARACT EXTRACTION  2012  . COLONOSCOPY    . Eyelid surgery Bilateral   . MASS EXCISION  03/2008   Appendix  . SKIN CANCER EXCISION    . TONSILLECTOMY       Current Meds  Medication Sig  . aspirin 81 MG tablet Take 81 mg by mouth every evening.   . Calcium Carbonate-Vitamin D (CALTRATE 600+D) 600-400 MG-UNIT per tablet Take 2 tablets by mouth every evening.   . Cholecalciferol (VITAMIN D3) 50 MCG (2000 UT) capsule Take 2,000 Units by mouth daily.  . Coenzyme Q10 (COQ-10) 100 MG CAPS Take 1 capsule by mouth daily.  Marland Kitchen denosumab (PROLIA) 60 MG/ML SOLN injection Inject 60 mg into the skin every 6 (six) months.   . fish oil-omega-3 fatty acids 1000 MG capsule Take 1 g by mouth every evening.   . hydrochlorothiazide (HYDRODIURIL) 25 MG tablet TAKE 1 TABLET BY MOUTH ONCE DAILY  . isosorbide mononitrate (IMDUR)  30 MG 24 hr tablet Take 1 tablet (30 mg total) by mouth daily.  . metoprolol succinate (TOPROL-XL) 50 MG 24 hr tablet TAKE 1 TABLET BY MOUTH ONCE DAILY WITH FOOD  . Multiple Vitamin (MULTIVITAMIN) tablet Take 1 tablet by mouth daily.    . nitroGLYCERIN (NITROSTAT) 0.4 MG SL tablet Place 1 tablet (0.4 mg total) under the tongue every 5 (five) minutes as needed for chest pain.  . potassium chloride SA (K-DUR) 20 MEQ tablet Take 20 mEq by mouth daily.  . simvastatin (ZOCOR) 40 MG tablet Take 1 tablet (40 mg total) by mouth at bedtime.  . vitamin C (ASCORBIC ACID) 500 MG tablet Take 500 mg by mouth every evening.   . [DISCONTINUED] potassium  chloride SA (K-DUR,KLOR-CON) 20 MEQ tablet Take 1 tablet (20 mEq total) by mouth 2 (two) times daily. (Patient taking differently: Take 20 mEq by mouth daily. )     Allergies:   Lisinopril and Statins   Social History   Tobacco Use  . Smoking status: Never Smoker  . Smokeless tobacco: Never Used  Substance Use Topics  . Alcohol use: Yes    Alcohol/week: 3.0 standard drinks    Types: 3 Glasses of wine per week    Comment: one glass a week  . Drug use: No     Family Hx: The patient's family history includes Heart attack in her father and mother; Heart disease in her father; Hypertension in her father and mother. There is no history of Colon cancer, Stroke, Esophageal cancer, Rectal cancer, or Stomach cancer.  ROS:   Please see the history of present illness.    All other systems reviewed and are negative.   Prior CV studies:   The following studies were reviewed today:  Cardiac CTA 09/29/2018: FINDINGS: Non-cardiac: See separate report from [facility].  Pulmonary veins drain normally to the left atrium.  Calcium Score: 566 Agatston units.  Coronary Arteries: Right dominant with no anomalies  LM: Mixed plaque with minimal stenosis proximal left main.  LAD system: Mixed plaque in the proximal LAD, possible moderate (50-70%) stenosis.  Circumflex system: Calcified plaque proximal LCx without significant stenosis.  RCA system: Calcified plaque with mild (<50%) stenosis in the proximal RCA.  IMPRESSION: 1. Coronary artery calcium score 566 Agatston units. This places the patient in the 92nd percentile for age and gender, suggesting high risk for future cardiac events.  2. Possible moderate (50-70%) stenosis in the mid LAD. Will send for FFR.  IMPRESSION: 1. Scarring and thickening of the minor fissure in the right lung and scarring at the left lung base. 2. Small to moderate-sized hiatal hernia.  ADDENDUM: CT FFR was done.  FFR 0.78  distal LAD. This suggests a borderline hemodynamically significant mid LAD stenosis.   Labs/Other Tests and Data Reviewed:    EKG:  An ECG dated 08/18/2018 was personally reviewed today and demonstrated:  Normal sinus rhythm.  Recent Labs: 08/18/2018: B Natriuretic Peptide 43.0; Hemoglobin 13.3; Platelets 230 08/25/2018: ALT 24; BUN 16; Creatinine, Ser 0.69; Potassium 4.2; Sodium 139  January 2020: HgbA1c 5.3%  Recent Lipid Panel Lab Results  Component Value Date/Time   CHOL 169 03/24/2018 08:11 AM   CHOL 169 01/14/2017 08:35 AM   TRIG 162.0 (H) 03/24/2018 08:11 AM   HDL 37.50 (L) 03/24/2018 08:11 AM   HDL 34 (L) 01/14/2017 08:35 AM   CHOLHDL 4 03/24/2018 08:11 AM   LDLCALC 99 03/24/2018 08:11 AM   LDLCALC 90 01/14/2017 08:35 AM  LDLDIRECT 170.3 07/09/2013 08:13 AM    Wt Readings from Last 3 Encounters:  01/14/19 165 lb 9.6 oz (75.1 kg)  01/06/19 166 lb (75.3 kg)  10/01/18 170 lb (77.1 kg)     Objective:    Vital Signs:  BP 123/78   Pulse 77   Ht 5' 6.5" (1.689 m)   Wt 165 lb 9.6 oz (75.1 kg)   LMP  (LMP Unknown)   BMI 26.33 kg/m    General: Patient appears comfortable at rest, seated in her home.  She also walked around to limited degree. HEENT: Conjunctiva and lids normal. Skin: Color and turgor appear normal. Lungs: Patient was not breathless while speaking in full sentences.  No audible wheezing. Neuropsychiatric: Affect is normal.  Gaze conjugate.  Smile symmetrical.  Voice tone and speech pattern were normal.  Patient moves all extremities.  ASSESSMENT & PLAN:    1.  CAD predominantly in LAD distribution by coronary CT angiography with FFR imaging.  Plan is to continue medical therapy at this point which includes aspirin, statin, Imdur, and Toprol-XL.  She does have other reflux symptoms and I asked her to consider taking Prilosec OTC for about 2 weeks to see if it also made a difference in case some of her symptoms could be GI in etiology.  2.  Mixed  hyperlipidemia, on Zocor.  3.  Essential hypertension, blood pressure is well controlled today.  COVID-19 Education: The signs and symptoms of COVID-19 were discussed with the patient and how to seek care for testing (follow up with PCP or arrange E-visit).  =The importance of social distancing was discussed today.  Time:   Today, I have spent 12 minutes with the patient with telehealth technology discussing the above problems.     Medication Adjustments/Labs and Tests Ordered: Current medicines are reviewed at length with the patient today.  Concerns regarding medicines are outlined above.   Tests Ordered: No orders of the defined types were placed in this encounter.   Medication Changes: No orders of the defined types were placed in this encounter.   Disposition:  Follow up 3 months in the office.  Signed, Rozann Lesches, MD  01/14/2019 1:27 PM    [facility]

## 2019-01-13 NOTE — Telephone Encounter (Signed)
Virtual Visit Pre-Appointment Phone Call  "(Name), I am calling you today to discuss your upcoming appointment. We are currently trying to limit exposure to the virus that causes COVID-19 by seeing patients at home rather than in the office."  1. "What is the BEST phone number to call the day of the visit?" - include this in appointment notes  2. Do you have or have access to (through a family member/friend) a smartphone with video capability that we can use for your visit?" a. If yes - list this number in appt notes as cell (if different from BEST phone #) and list the appointment type as a VIDEO visit in appointment notes b. If no - list the appointment type as a PHONE visit in appointment notes  3. Confirm consent - "In the setting of the current Covid19 crisis, you are scheduled for a (phone or video) visit with your provider on (date) at (time).  Just as we do with many in-office visits, in order for you to participate in this visit, we must obtain consent.  If you'd like, I can send this to your mychart (if signed up) or email for you to review.  Otherwise, I can obtain your verbal consent now.  All virtual visits are billed to your insurance company just like a normal visit would be.  By agreeing to a virtual visit, we'd like you to understand that the technology does not allow for your provider to perform an examination, and thus may limit your provider's ability to fully assess your condition. If your provider identifies any concerns that need to be evaluated in person, we will make arrangements to do so.  Finally, though the technology is pretty good, we cannot assure that it will always work on either your or our end, and in the setting of a video visit, we may have to convert it to a phone-only visit.  In either situation, we cannot ensure that we have a secure connection.  Are you willing to proceed?" STAFF: Did the patient verbally acknowledge consent to telehealth visit? Document  YES/NO here: Yes  4. Advise patient to be prepared - "Two hours prior to your appointment, go ahead and check your blood pressure, pulse, oxygen saturation, and your weight (if you have the equipment to check those) and write them all down. When your visit starts, your provider will ask you for this information. If you have an Apple Watch or Kardia device, please plan to have heart rate information ready on the day of your appointment. Please have a pen and paper handy nearby the day of the visit as well."  5. Give patient instructions for MyChart download to smartphone OR [email or web address] as below if video visit (depending on what platform provider is using)  6. Inform patient they will receive a phone call 15 minutes prior to their appointment time (may be from unknown caller ID) so they should be prepared to answer    TELEPHONE CALL NOTE  Angela [name] has been deemed a candidate for a follow-up tele-health visit to limit community exposure during the Covid-19 pandemic. I spoke with the patient via phone to ensure availability of phone/video source, confirm preferred email & phone number, and discuss instructions and expectations.  I reminded Angela [name] to be prepared with any vital sign and/or heart rhythm information that could potentially be obtained via home monitoring, at the time of her visit. I reminded Angela [name] to expect a phone call prior to  her visit.  Orinda Kenner 01/13/2019 9:39 AM

## 2019-01-14 ENCOUNTER — Telehealth (INDEPENDENT_AMBULATORY_CARE_PROVIDER_SITE_OTHER): Payer: Medicare Other | Admitting: Cardiology

## 2019-01-14 ENCOUNTER — Encounter: Payer: Self-pay | Admitting: Cardiology

## 2019-01-14 VITALS — BP 123/78 | HR 77 | Ht 66.5 in | Wt 165.6 lb

## 2019-01-14 DIAGNOSIS — Z7189 Other specified counseling: Secondary | ICD-10-CM

## 2019-01-14 DIAGNOSIS — E782 Mixed hyperlipidemia: Secondary | ICD-10-CM

## 2019-01-14 DIAGNOSIS — I10 Essential (primary) hypertension: Secondary | ICD-10-CM | POA: Diagnosis not present

## 2019-01-14 DIAGNOSIS — I25119 Atherosclerotic heart disease of native coronary artery with unspecified angina pectoris: Secondary | ICD-10-CM

## 2019-01-14 NOTE — Patient Instructions (Addendum)
Medication Instructions:   Your physician recommends that you continue on your current medications as directed. Please refer to the Current Medication list given to you today.  You may try over-the-counter prilosec  Labwork:  NONE  Testing/Procedures:  NONE  Follow-Up:  Your physician recommends that you schedule a follow-up appointment in: 3 month office visit.  Any Other Special Instructions Will Be Listed Below (If Applicable).  If you need a refill on your cardiac medications before your next appointment, please call your pharmacy.

## 2019-02-04 ENCOUNTER — Other Ambulatory Visit: Payer: Self-pay

## 2019-02-04 ENCOUNTER — Ambulatory Visit (INDEPENDENT_AMBULATORY_CARE_PROVIDER_SITE_OTHER): Payer: Medicare Other | Admitting: Nurse Practitioner

## 2019-02-04 ENCOUNTER — Encounter: Payer: Self-pay | Admitting: Nurse Practitioner

## 2019-02-04 DIAGNOSIS — G4733 Obstructive sleep apnea (adult) (pediatric): Secondary | ICD-10-CM

## 2019-02-04 NOTE — Telephone Encounter (Signed)
Exercise that achieved a heart rate of 70% to 80% of your maximum age predicted heart rate would be reasonable, so approximately 100 to 115 bpm.

## 2019-02-04 NOTE — Progress Notes (Signed)
Virtual Visit via Telephone Note  I connected with Angela [name] on 02/04/19 at  2:00 PM EDT by telephone and verified that I am speaking with the correct person using two identifiers.  Location: Patient: home Provider: office   I discussed the limitations, risks, security and privacy concerns of performing an evaluation and management service by telephone and the availability of in person appointments. I also discussed with the patient that there may be a patient responsible charge related to this service. The patient expressed understanding and agreed to proceed.   History of Present Illness: 73 year old female with OSA followed by Dr. Halford Chessman.  Patient has a tele-visit today for a follow-up.  She was last seen by Dr. Halford Chessman on 02/04/2018.  Patient states that she is doing well with CPAP.  She gets her supplies through [employer] in [place].  States that she is compliant with CPAP and wears it nightly.  She does benefit from use of CPAP and feels much less drowsy during the day.  She states that she has supplies. Denies f/c/s, n/v/d, hemoptysis, PND, leg swelling.    Observations/Objective: Sleep tests HST 08/15/15 >> AHI 52.9 SaO2 low 65%  CPAP compliance report: 01/04/19 - 02/02/19: usage days 30/30 (100%), average usage 8 hours 12 minutes, CPAP AutoSet 5-15 cmH20, AHI:0.5  Assessment and Plan: OSA: Patient states that she is doing well with CPAP.  She gets her supplies through [employer] in [place].  States that she is compliant with CPAP and wears it nightly.  She does benefit from use of CPAP and feels much less drowsy during the day.  Patient Instructions  Patient continues to benefit from CPAP with good compliance and control documented Continue CPAP at current settings Continue current medications Goal of 4 hours or more usage per night Maintain healthy weight Do not drive if drowsy   Follow Up Instructions: Follow up with Dr. Halford Chessman in 1 year or sooner if needed   I  discussed the assessment and treatment plan with the patient. The patient was provided an opportunity to ask questions and all were answered. The patient agreed with the plan and demonstrated an understanding of the instructions.   The patient was advised to call back or seek an in-person evaluation if the symptoms worsen or if the condition fails to improve as anticipated.  I provided 22 minutes of non-face-to-face time during this encounter.   Fenton Foy, NP

## 2019-02-04 NOTE — Patient Instructions (Signed)
Patient continues to benefit from CPAP with good compliance and control documented Continue CPAP at current settings Continue current medications Goal of 4 hours or more usage per night Maintain healthy weight Do not drive if drowsy  Follow up: Follow up with Dr. Halford Chessman in 1 year or sooner if needed

## 2019-02-04 NOTE — Assessment & Plan Note (Signed)
OSA: Patient states that she is doing well with CPAP.  She gets her supplies through [employer] in [place].  States that she is compliant with CPAP and wears it nightly.  She does benefit from use of CPAP and feels much less drowsy during the day.  Patient Instructions  Patient continues to benefit from CPAP with good compliance and control documented Continue CPAP at current settings Continue current medications Goal of 4 hours or more usage per night Maintain healthy weight Do not drive if drowsy  Follow up: Follow up with Dr. Halford Chessman in 1 year or sooner if needed

## 2019-02-27 ENCOUNTER — Other Ambulatory Visit (HOSPITAL_COMMUNITY): Payer: Self-pay | Admitting: *Deleted

## 2019-02-27 DIAGNOSIS — M8580 Other specified disorders of bone density and structure, unspecified site: Secondary | ICD-10-CM

## 2019-02-27 DIAGNOSIS — Z1231 Encounter for screening mammogram for malignant neoplasm of breast: Secondary | ICD-10-CM

## 2019-02-27 DIAGNOSIS — C50912 Malignant neoplasm of unspecified site of left female breast: Secondary | ICD-10-CM

## 2019-02-27 DIAGNOSIS — C50919 Malignant neoplasm of unspecified site of unspecified female breast: Secondary | ICD-10-CM

## 2019-03-02 ENCOUNTER — Other Ambulatory Visit: Payer: Self-pay

## 2019-03-02 ENCOUNTER — Inpatient Hospital Stay (HOSPITAL_COMMUNITY): Payer: Medicare Other | Attending: Hematology

## 2019-03-02 ENCOUNTER — Inpatient Hospital Stay (HOSPITAL_COMMUNITY): Payer: Medicare Other

## 2019-03-02 VITALS — BP 129/72 | HR 62 | Temp 98.4°F | Resp 16

## 2019-03-02 DIAGNOSIS — Z1231 Encounter for screening mammogram for malignant neoplasm of breast: Secondary | ICD-10-CM

## 2019-03-02 DIAGNOSIS — C50912 Malignant neoplasm of unspecified site of left female breast: Secondary | ICD-10-CM

## 2019-03-02 DIAGNOSIS — Z79899 Other long term (current) drug therapy: Secondary | ICD-10-CM | POA: Insufficient documentation

## 2019-03-02 DIAGNOSIS — Z170 Estrogen receptor positive status [ER+]: Secondary | ICD-10-CM | POA: Insufficient documentation

## 2019-03-02 DIAGNOSIS — C50919 Malignant neoplasm of unspecified site of unspecified female breast: Secondary | ICD-10-CM

## 2019-03-02 DIAGNOSIS — M8580 Other specified disorders of bone density and structure, unspecified site: Secondary | ICD-10-CM

## 2019-03-02 DIAGNOSIS — Z9223 Personal history of estrogen therapy: Secondary | ICD-10-CM | POA: Insufficient documentation

## 2019-03-02 DIAGNOSIS — Z853 Personal history of malignant neoplasm of breast: Secondary | ICD-10-CM | POA: Diagnosis not present

## 2019-03-02 LAB — COMPREHENSIVE METABOLIC PANEL WITH GFR
ALT: 35 U/L (ref 0–44)
AST: 28 U/L (ref 15–41)
Albumin: 4.3 g/dL (ref 3.5–5.0)
Alkaline Phosphatase: 63 U/L (ref 38–126)
Anion gap: 9 (ref 5–15)
BUN: 20 mg/dL (ref 8–23)
CO2: 29 mmol/L (ref 22–32)
Calcium: 9.8 mg/dL (ref 8.9–10.3)
Chloride: 99 mmol/L (ref 98–111)
Creatinine, Ser: 0.68 mg/dL (ref 0.44–1.00)
GFR calc Af Amer: >60 mL/min
GFR calc non Af Amer: >60 mL/min
Glucose, Bld: 112 mg/dL — ABNORMAL HIGH (ref 70–99)
Potassium: 3.9 mmol/L (ref 3.5–5.1)
Sodium: 137 mmol/L (ref 135–145)
Total Bilirubin: 0.6 mg/dL (ref 0.3–1.2)
Total Protein: 7 g/dL (ref 6.5–8.1)

## 2019-03-02 MED ORDER — DENOSUMAB 60 MG/ML ~~LOC~~ SOSY
60.0000 mg | PREFILLED_SYRINGE | Freq: Once | SUBCUTANEOUS | Status: AC
  Administered 2019-03-02: 12:00:00 60 mg via SUBCUTANEOUS
  Filled 2019-03-02: qty 1

## 2019-03-02 NOTE — Progress Notes (Signed)
Angela [name] presents today for injection per the provider's orders.  Prolia administration without incident; see MAR for injection details.  Patient tolerated procedure well and without incident.  No questions or complaints noted at this time. Pt d/c ambulatory

## 2019-03-17 ENCOUNTER — Encounter: Payer: Self-pay | Admitting: Family Medicine

## 2019-03-30 ENCOUNTER — Telehealth: Payer: Self-pay | Admitting: Family Medicine

## 2019-03-30 NOTE — Telephone Encounter (Signed)
Called patient to schedule AWV; no answer, could not leave voicemail. SF

## 2019-04-15 ENCOUNTER — Encounter: Payer: Medicare Other | Admitting: Family Medicine

## 2019-04-16 ENCOUNTER — Ambulatory Visit: Payer: Medicare Other | Admitting: Cardiology

## 2019-04-24 NOTE — Progress Notes (Signed)
Reviewed and agree with assessment/plan.   Emory Leaver, MD [facility] 09/05/2016, 12:24 PM Pager:  [phone]  

## 2019-04-30 ENCOUNTER — Telehealth: Payer: Self-pay | Admitting: Family Medicine

## 2019-04-30 NOTE — Telephone Encounter (Signed)
I left a message asking the patient to call me at [phone] to schedule AWV with Courtney. VDM (Dee-Dee) °

## 2019-05-02 ENCOUNTER — Encounter: Payer: Self-pay | Admitting: Family Medicine

## 2019-05-02 DIAGNOSIS — Z7189 Other specified counseling: Secondary | ICD-10-CM

## 2019-05-04 NOTE — Telephone Encounter (Signed)
Please see message and advise.  Thank you. ° °

## 2019-05-05 NOTE — Telephone Encounter (Signed)
Called pt and advised.  

## 2019-05-15 LAB — SAR COV2 SEROLOGY (COVID19)AB(IGG),IA: SARS CoV2 AB IGG: NEGATIVE

## 2019-05-18 ENCOUNTER — Encounter: Payer: Self-pay | Admitting: Family Medicine

## 2019-05-19 ENCOUNTER — Other Ambulatory Visit: Payer: Self-pay | Admitting: *Deleted

## 2019-05-19 ENCOUNTER — Encounter: Payer: Medicare Other | Admitting: Family Medicine

## 2019-05-19 ENCOUNTER — Ambulatory Visit (INDEPENDENT_AMBULATORY_CARE_PROVIDER_SITE_OTHER): Payer: Medicare Other | Admitting: Podiatry

## 2019-05-19 ENCOUNTER — Encounter: Payer: Self-pay | Admitting: Podiatry

## 2019-05-19 ENCOUNTER — Encounter: Payer: Self-pay | Admitting: Family Medicine

## 2019-05-19 ENCOUNTER — Other Ambulatory Visit: Payer: Self-pay

## 2019-05-19 DIAGNOSIS — M79675 Pain in left toe(s): Secondary | ICD-10-CM

## 2019-05-19 DIAGNOSIS — B351 Tinea unguium: Secondary | ICD-10-CM

## 2019-05-19 DIAGNOSIS — Z7189 Other specified counseling: Secondary | ICD-10-CM

## 2019-05-19 DIAGNOSIS — M79674 Pain in right toe(s): Secondary | ICD-10-CM | POA: Diagnosis not present

## 2019-05-19 NOTE — Progress Notes (Signed)
Complaint:  Visit Type: Patient returns to my office for continued preventative foot care services. Complaint: Patient states" my nails in her big toes and ingrown and  painful to walk and wear shoes" . The patient presents for preventative foot care services. No changes to ROS  Podiatric Exam: Vascular: dorsalis pedis and posterior tibial pulses are palpable bilateral. Capillary return is immediate. Temperature gradient is WNL. Skin turgor WNL  Sensorium: Normal Semmes Weinstein monofilament test. Normal tactile sensation bilaterally. Nail Exam: Pt has thick disfigured discolored nails with subungual debris and pincer toenail right hallux. Ulcer Exam: There is no evidence of ulcer or pre-ulcerative changes or infection. Orthopedic Exam: Muscle tone and strength are WNL. No limitations in general ROM. No crepitus or effusions noted. Foot type and digits show no abnormalities. Bony prominences are unremarkable. Skin: No Porokeratosis. No infection or ulcers  Diagnosis:  Onychomycosis, , Pain in right toe, pain in left toes  Treatment & Plan Procedures and Treatment: Consent by patient was obtained for treatment procedures.   Debridement of mycotic and hypertrophic toenails, 1 through 5 bilateral and clearing of subungual debris. No ulceration, no infection noted.  Return Visit-Office Procedure: Patient instructed to return to the office for a follow up visit 3 months for continued evaluation and treatment.    Gardiner Barefoot DPM

## 2019-05-20 ENCOUNTER — Telehealth: Payer: Self-pay | Admitting: Cardiology

## 2019-05-20 NOTE — Telephone Encounter (Signed)
Pt says for the last 2 weeks has had some chest pressure/tightness/fullness that has increased in frequency - has taken SL NTG daily since September mostly takes - denies any SOB/dizziness/swelling - denies any chest pain - says HR has been 60s - 80s - BP 150/90 yesterday and after exercise 125/75 - pt has f/u 9/16 with Dr Domenic Polite

## 2019-05-20 NOTE — Telephone Encounter (Signed)
Patient has not been feeling well and lately has been taking one -two more nitro a day in addition to what she currently takes  Having tightness in chest - mild right now-full feeling.

## 2019-05-21 ENCOUNTER — Ambulatory Visit: Payer: Medicare Other | Admitting: Cardiology

## 2019-05-21 NOTE — Telephone Encounter (Signed)
Noted. We could consider increasing Imdur to 30 mg twice daily to see if that improves her symptoms. If we cannot control things with medications, we may need to reconsider cardiac catheterization.

## 2019-05-22 NOTE — Telephone Encounter (Signed)
Pt voiced understanding - updated medication list 

## 2019-05-27 ENCOUNTER — Other Ambulatory Visit: Payer: Self-pay | Admitting: Cardiology

## 2019-05-27 ENCOUNTER — Ambulatory Visit: Payer: Medicare Other | Admitting: Cardiology

## 2019-05-27 ENCOUNTER — Other Ambulatory Visit: Payer: Self-pay

## 2019-05-27 ENCOUNTER — Encounter: Payer: Self-pay | Admitting: Family Medicine

## 2019-05-27 ENCOUNTER — Telehealth: Payer: Self-pay | Admitting: Cardiology

## 2019-05-27 ENCOUNTER — Encounter: Payer: Self-pay | Admitting: Cardiology

## 2019-05-27 VITALS — BP 118/88 | HR 67 | Temp 97.6°F | Ht 66.0 in | Wt 168.0 lb

## 2019-05-27 DIAGNOSIS — I10 Essential (primary) hypertension: Secondary | ICD-10-CM

## 2019-05-27 DIAGNOSIS — E782 Mixed hyperlipidemia: Secondary | ICD-10-CM

## 2019-05-27 DIAGNOSIS — I200 Unstable angina: Secondary | ICD-10-CM | POA: Diagnosis not present

## 2019-05-27 DIAGNOSIS — I25119 Atherosclerotic heart disease of native coronary artery with unspecified angina pectoris: Secondary | ICD-10-CM

## 2019-05-27 DIAGNOSIS — Z01810 Encounter for preprocedural cardiovascular examination: Secondary | ICD-10-CM

## 2019-05-27 NOTE — Progress Notes (Signed)
Cardiology Office Note  Date: 05/27/2019   ID: Angela [name], DOB [birth year], MRN [id number]  PCP:  Marin Olp, MD  Cardiologist:  Rozann Lesches, MD Electrophysiologist:  None   Chief Complaint  Patient presents with  . Cardiac follow-up    History of Present Illness: Angela [name] is a 73 y.o. female last assessed via telehealth encounter in May. She presents today for a follow-up visit with her husband.  She states that she has had more frequent episodes of chest tightness, increasing use of nitroglycerin.  Her symptoms are not solely exertional however, she has had episodic chest tightness at rest, including waking up at nighttime occasionally.  She reports compliance with her medications.  Cardiac regimen includes aspirin, Imdur, Toprol-XL, Zocor, and HCTZ with potassium supplements.  Imdur was just increased to 30 mg twice daily and symptoms have not improved substantially.  She has evidence of coronary artery disease based on cardiac CTA in January, calcium score 566 with moderate disease within the mid LAD.  FFR was 0.78 in the distal LAD suggesting borderline hemodynamic significance.  We have discussed treatment options since that time and have elected medical therapy to this point.  With progressive symptoms, we have discussed diagnostic cardiac catheterization with an eye toward revascularization of the LAD.  We discussed the risks and benefits and she is in agreement to proceed.  I personally reviewed her ECG which shows sinus rhythm with lead motion artifact.  Past Medical History:  Diagnosis Date  . Arthritis   . BCC (basal cell carcinoma)   . Breast cancer ([place]) 2009  . Cataract   . Hyperlipidemia   . Hypertension   . Macular retinal cyst of right eye   . OSA (obstructive sleep apnea)    CPAP  . Osteopenia   . Personal history of radiation therapy     Past Surgical History:  Procedure Laterality Date  . BREAST EXCISIONAL BIOPSY    . BREAST  LUMPECTOMY Left 06/2008  . BREAST LUMPECTOMY    . CATARACT EXTRACTION  2012  . COLONOSCOPY    . Eyelid surgery Bilateral   . MASS EXCISION  03/2008   Appendix  . SKIN CANCER EXCISION    . TONSILLECTOMY      Current Outpatient Medications  Medication Sig Dispense Refill  . aspirin 81 MG tablet Take 81 mg by mouth every evening.     . Calcium Carbonate-Vitamin D (CALTRATE 600+D) 600-400 MG-UNIT per tablet Take 2 tablets by mouth every evening.     . Cholecalciferol (VITAMIN D3) 50 MCG (2000 UT) capsule Take 2,000 Units by mouth daily.    . Coenzyme Q10 (COQ-10) 100 MG CAPS Take 1 capsule by mouth daily.    Marland Kitchen denosumab (PROLIA) 60 MG/ML SOLN injection Inject 60 mg into the skin every 6 (six) months.     . fish oil-omega-3 fatty acids 1000 MG capsule Take 1 g by mouth every evening.     . hydrochlorothiazide (HYDRODIURIL) 25 MG tablet TAKE 1 TABLET BY MOUTH ONCE DAILY 90 tablet 1  . isosorbide mononitrate (IMDUR) 30 MG 24 hr tablet Take 30 mg by mouth 2 (two) times daily.     . metoprolol succinate (TOPROL-XL) 50 MG 24 hr tablet TAKE 1 TABLET BY MOUTH ONCE DAILY WITH FOOD 90 tablet 1  . Multiple Vitamin (MULTIVITAMIN) tablet Take 1 tablet by mouth daily.      . nitroGLYCERIN (NITROSTAT) 0.4 MG SL tablet Place 1 tablet (0.4 mg total)  under the tongue every 5 (five) minutes as needed for chest pain. 25 tablet 6  . potassium chloride SA (K-DUR) 20 MEQ tablet Take 20 mEq by mouth daily.    . simvastatin (ZOCOR) 40 MG tablet Take 1 tablet (40 mg total) by mouth at bedtime. 90 tablet 3  . vitamin C (ASCORBIC ACID) 500 MG tablet Take 500 mg by mouth every evening.      No current facility-administered medications for this visit.    Allergies:  Lisinopril and Statins   Social History: The patient  reports that she has never smoked. She has never used smokeless tobacco. She reports current alcohol use of about 3.0 standard drinks of alcohol per week. She reports that she does not use drugs.    Family History: The patient's family history includes Heart attack in her father and mother; Heart disease in her father; Hypertension in her father and mother.   ROS:  Please see the history of present illness. Otherwise, complete review of systems is positive for none.  All other systems are reviewed and negative.   Physical Exam: VS:  BP 118/88   Pulse 67   Temp 97.6 F (36.4 C)   Ht 5\' 6"  (1.676 m)   Wt 168 lb (76.2 kg)   LMP  (LMP Unknown)   SpO2 97%   BMI 27.12 kg/m , BMI Body mass index is 27.12 kg/m.  Wt Readings from Last 3 Encounters:  05/27/19 168 lb (76.2 kg)  01/14/19 165 lb 9.6 oz (75.1 kg)  01/06/19 166 lb (75.3 kg)    General: Patient appears comfortable at rest. HEENT: Conjunctiva and lids normal, wearing a mask. Neck: Supple, no elevated JVP or carotid bruits, no thyromegaly. Lungs: Clear to auscultation, nonlabored breathing at rest. Cardiac: Regular rate and rhythm, no S3 or significant systolic murmur, no pericardial rub. Abdomen: Soft, nontender, bowel sounds present. Extremities: No pitting edema, distal pulses 2+. Skin: Warm and dry. Musculoskeletal: No kyphosis. Neuropsychiatric: Alert and oriented x3, affect grossly appropriate.  ECG:  An ECG dated 08/18/2018 was personally reviewed today and demonstrated:  Normal sinus rhythm.  Recent Labwork: 08/18/2018: B Natriuretic Peptide 43.0; Hemoglobin 13.3; Platelets 230 03/02/2019: ALT 35; AST 28; BUN 20; Creatinine, Ser 0.68; Potassium 3.9; Sodium 137     Component Value Date/Time   CHOL 169 03/24/2018 0811   CHOL 169 01/14/2017 0835   TRIG 162.0 (H) 03/24/2018 0811   HDL 37.50 (L) 03/24/2018 0811   HDL 34 (L) 01/14/2017 0835   CHOLHDL 4 03/24/2018 0811   VLDL 32.4 03/24/2018 0811   LDLCALC 99 03/24/2018 0811   LDLCALC 90 01/14/2017 0835   LDLDIRECT 170.3 07/09/2013 0813    Other Studies Reviewed Today:  Cardiac CTA 09/29/2018: FINDINGS: Non-cardiac: See separate report from [facility]  Radiology.  Pulmonary veins drain normally to the left atrium.  Calcium Score: 566 Agatston units.  Coronary Arteries: Right dominant with no anomalies  LM: Mixed plaque with minimal stenosis proximal left main.  LAD system: Mixed plaque in the proximal LAD, possible moderate (50-70%) stenosis.  Circumflex system: Calcified plaque proximal LCx without significant stenosis.  RCA system: Calcified plaque with mild (<50%) stenosis in the proximal RCA.  IMPRESSION: 1. Coronary artery calcium score 566 Agatston units. This places the patient in the 92nd percentile for age and gender, suggesting high risk for future cardiac events.  2. Possible moderate (50-70%) stenosis in the mid LAD. Will send for FFR.  IMPRESSION: 1. Scarring and thickening of the minor fissure  in the right lung and scarring at the left lung base. 2. Small to moderate-sized hiatal hernia.  ADDENDUM: CT FFR was done.  FFR 0.78 distal LAD. This suggests a borderline hemodynamically significant mid LAD stenosis.  Assessment and Plan:  1.  Recurring and progressive chest tightness concerning for accelerating angina.  She has CAD evident by cardiac CT back in January with moderate mid LAD stenosis that was of borderline significance by FFR imaging.  We have managed her medically, she continues on aspirin, statin, Imdur which was recently uptitrated, and Toprol-XL with no improvement in symptoms.  She also has a hiatal hernia which could be potentially contributing, and we have also discussed the possibility of associated microvascular angina.  Given that her symptoms are progressive, we will pursue a diagnostic cardiac catheterization with eye toward revascularization.  FFR assessment at catheterization may also be potentially useful.  We have discussed the risks and benefits and she is in agreement to proceed.  2.  Mixed hyperlipidemia on Zocor.  3.  Essential hypertension, blood pressure is well  controlled today on current regimen.  Medication Adjustments/Labs and Tests Ordered: Current medicines are reviewed at length with the patient today.  Concerns regarding medicines are outlined above.   Tests Ordered: Orders Placed This Encounter  Procedures  . Basic metabolic panel  . CBC  . EKG 12-Lead    Medication Changes: No orders of the defined types were placed in this encounter.   Disposition:  Follow up after procedure.  Signed, Satira Sark, MD, [facility] 05/27/2019 12:32 PM    [place] [facility] [place], [place], [place] [address] Phone: [phone]; Fax: [phone]

## 2019-05-27 NOTE — H&P (View-Only) (Signed)
Cardiology Office Note  Date: 05/27/2019   ID: Angela [name], DOB 1946-08-24, MRN [id number]  PCP:  Marin Olp, MD  Cardiologist:  Rozann Lesches, MD Electrophysiologist:  None   Chief Complaint  Patient presents with  . Cardiac follow-up    History of Present Illness: Angela [name] is a 73 y.o. female last assessed via telehealth encounter in May. She presents today for a follow-up visit with her husband.  She states that she has had more frequent episodes of chest tightness, increasing use of nitroglycerin.  Her symptoms are not solely exertional however, she has had episodic chest tightness at rest, including waking up at nighttime occasionally.  She reports compliance with her medications.  Cardiac regimen includes aspirin, Imdur, Toprol-XL, Zocor, and HCTZ with potassium supplements.  Imdur was just increased to 30 mg twice daily and symptoms have not improved substantially.  She has evidence of coronary artery disease based on cardiac CTA in January, calcium score 566 with moderate disease within the mid LAD.  FFR was 0.78 in the distal LAD suggesting borderline hemodynamic significance.  We have discussed treatment options since that time and have elected medical therapy to this point.  With progressive symptoms, we have discussed diagnostic cardiac catheterization with an eye toward revascularization of the LAD.  We discussed the risks and benefits and she is in agreement to proceed.  I personally reviewed her ECG which shows sinus rhythm with lead motion artifact.  Past Medical History:  Diagnosis Date  . Arthritis   . BCC (basal cell carcinoma)   . Breast cancer ([place]) 2009  . Cataract   . Hyperlipidemia   . Hypertension   . Macular retinal cyst of right eye   . OSA (obstructive sleep apnea)    CPAP  . Osteopenia   . Personal history of radiation therapy     Past Surgical History:  Procedure Laterality Date  . BREAST EXCISIONAL BIOPSY    . BREAST  LUMPECTOMY Left 06/2008  . BREAST LUMPECTOMY    . CATARACT EXTRACTION  2012  . COLONOSCOPY    . Eyelid surgery Bilateral   . MASS EXCISION  03/2008   Appendix  . SKIN CANCER EXCISION    . TONSILLECTOMY      Current Outpatient Medications  Medication Sig Dispense Refill  . aspirin 81 MG tablet Take 81 mg by mouth every evening.     . Calcium Carbonate-Vitamin D (CALTRATE 600+D) 600-400 MG-UNIT per tablet Take 2 tablets by mouth every evening.     . Cholecalciferol (VITAMIN D3) 50 MCG (2000 UT) capsule Take 2,000 Units by mouth daily.    . Coenzyme Q10 (COQ-10) 100 MG CAPS Take 1 capsule by mouth daily.    Marland Kitchen denosumab (PROLIA) 60 MG/ML SOLN injection Inject 60 mg into the skin every 6 (six) months.     . fish oil-omega-3 fatty acids 1000 MG capsule Take 1 g by mouth every evening.     . hydrochlorothiazide (HYDRODIURIL) 25 MG tablet TAKE 1 TABLET BY MOUTH ONCE DAILY 90 tablet 1  . isosorbide mononitrate (IMDUR) 30 MG 24 hr tablet Take 30 mg by mouth 2 (two) times daily.     . metoprolol succinate (TOPROL-XL) 50 MG 24 hr tablet TAKE 1 TABLET BY MOUTH ONCE DAILY WITH FOOD 90 tablet 1  . Multiple Vitamin (MULTIVITAMIN) tablet Take 1 tablet by mouth daily.      . nitroGLYCERIN (NITROSTAT) 0.4 MG SL tablet Place 1 tablet (0.4 mg total)  under the tongue every 5 (five) minutes as needed for chest pain. 25 tablet 6  . potassium chloride SA (K-DUR) 20 MEQ tablet Take 20 mEq by mouth daily.    . simvastatin (ZOCOR) 40 MG tablet Take 1 tablet (40 mg total) by mouth at bedtime. 90 tablet 3  . vitamin C (ASCORBIC ACID) 500 MG tablet Take 500 mg by mouth every evening.      No current facility-administered medications for this visit.    Allergies:  Lisinopril and Statins   Social History: The patient  reports that she has never smoked. She has never used smokeless tobacco. She reports current alcohol use of about 3.0 standard drinks of alcohol per week. She reports that she does not use drugs.    Family History: The patient's family history includes Heart attack in her father and mother; Heart disease in her father; Hypertension in her father and mother.   ROS:  Please see the history of present illness. Otherwise, complete review of systems is positive for none.  All other systems are reviewed and negative.   Physical Exam: VS:  BP 118/88   Pulse 67   Temp 97.6 F (36.4 C)   Ht 5\' 6"  (1.676 m)   Wt 168 lb (76.2 kg)   LMP  (LMP Unknown)   SpO2 97%   BMI 27.12 kg/m , BMI Body mass index is 27.12 kg/m.  Wt Readings from Last 3 Encounters:  05/27/19 168 lb (76.2 kg)  01/14/19 165 lb 9.6 oz (75.1 kg)  01/06/19 166 lb (75.3 kg)    General: Patient appears comfortable at rest. HEENT: Conjunctiva and lids normal, wearing a mask. Neck: Supple, no elevated JVP or carotid bruits, no thyromegaly. Lungs: Clear to auscultation, nonlabored breathing at rest. Cardiac: Regular rate and rhythm, no S3 or significant systolic murmur, no pericardial rub. Abdomen: Soft, nontender, bowel sounds present. Extremities: No pitting edema, distal pulses 2+. Skin: Warm and dry. Musculoskeletal: No kyphosis. Neuropsychiatric: Alert and oriented x3, affect grossly appropriate.  ECG:  An ECG dated 08/18/2018 was personally reviewed today and demonstrated:  Normal sinus rhythm.  Recent Labwork: 08/18/2018: B Natriuretic Peptide 43.0; Hemoglobin 13.3; Platelets 230 03/02/2019: ALT 35; AST 28; BUN 20; Creatinine, Ser 0.68; Potassium 3.9; Sodium 137     Component Value Date/Time   CHOL 169 03/24/2018 0811   CHOL 169 01/14/2017 0835   TRIG 162.0 (H) 03/24/2018 0811   HDL 37.50 (L) 03/24/2018 0811   HDL 34 (L) 01/14/2017 0835   CHOLHDL 4 03/24/2018 0811   VLDL 32.4 03/24/2018 0811   LDLCALC 99 03/24/2018 0811   LDLCALC 90 01/14/2017 0835   LDLDIRECT 170.3 07/09/2013 0813    Other Studies Reviewed Today:  Cardiac CTA 09/29/2018: FINDINGS: Non-cardiac: See separate report from [facility]  Radiology.  Pulmonary veins drain normally to the left atrium.  Calcium Score: 566 Agatston units.  Coronary Arteries: Right dominant with no anomalies  LM: Mixed plaque with minimal stenosis proximal left main.  LAD system: Mixed plaque in the proximal LAD, possible moderate (50-70%) stenosis.  Circumflex system: Calcified plaque proximal LCx without significant stenosis.  RCA system: Calcified plaque with mild (<50%) stenosis in the proximal RCA.  IMPRESSION: 1. Coronary artery calcium score 566 Agatston units. This places the patient in the 92nd percentile for age and gender, suggesting high risk for future cardiac events.  2. Possible moderate (50-70%) stenosis in the mid LAD. Will send for FFR.  IMPRESSION: 1. Scarring and thickening of the minor fissure  in the right lung and scarring at the left lung base. 2. Small to moderate-sized hiatal hernia.  ADDENDUM: CT FFR was done.  FFR 0.78 distal LAD. This suggests a borderline hemodynamically significant mid LAD stenosis.  Assessment and Plan:  1.  Recurring and progressive chest tightness concerning for accelerating angina.  She has CAD evident by cardiac CT back in January with moderate mid LAD stenosis that was of borderline significance by FFR imaging.  We have managed her medically, she continues on aspirin, statin, Imdur which was recently uptitrated, and Toprol-XL with no improvement in symptoms.  She also has a hiatal hernia which could be potentially contributing, and we have also discussed the possibility of associated microvascular angina.  Given that her symptoms are progressive, we will pursue a diagnostic cardiac catheterization with eye toward revascularization.  FFR assessment at catheterization may also be potentially useful.  We have discussed the risks and benefits and she is in agreement to proceed.  2.  Mixed hyperlipidemia on Zocor.  3.  Essential hypertension, blood pressure is well  controlled today on current regimen.  Medication Adjustments/Labs and Tests Ordered: Current medicines are reviewed at length with the patient today.  Concerns regarding medicines are outlined above.   Tests Ordered: Orders Placed This Encounter  Procedures  . Basic metabolic panel  . CBC  . EKG 12-Lead    Medication Changes: No orders of the defined types were placed in this encounter.   Disposition:  Follow up after procedure.  Signed, Satira Sark, MD, [facility] 05/27/2019 12:32 PM    [place] at [place], [place], [place] [address] Phone: [phone]; Fax: [phone]

## 2019-05-27 NOTE — Patient Instructions (Addendum)
Medication Instructions:    Your physician recommends that you continue on your current medications as directed. Please refer to the Current Medication list given to you today.  Labwork:  Your physician recommends that you return for lab work in: Friday, May 29, 2019 to check your BMET & CBC. Please do covid testing afterwards at [facility]. You will need to go home and quarantine until your heart cath is completed once you have your covid test.  Testing/Procedures: Your physician has requested that you have a cardiac catheterization. Cardiac catheterization is used to diagnose and/or treat various heart conditions. Doctors may recommend this procedure for a number of different reasons. The most common reason is to evaluate chest pain. Chest pain can be a symptom of coronary artery disease (CAD), and cardiac catheterization can show whether plaque is narrowing or blocking your heart's arteries. This procedure is also used to evaluate the valves, as well as measure the blood flow and oxygen levels in different parts of your heart. For further information please visit [email or web address]. Please follow instruction sheet, as given.  Follow-Up:  Your physician recommends that you schedule a follow-up appointment in: 1 month.  Any Other Special Instructions Will Be Listed Below (If Applicable).  If you need a refill on your cardiac medications before your next appointment, please call your pharmacy.     [place] [address] Dept: [phone] Loc: [place]  05/27/2019  You are scheduled for a Cardiac Catheterization on Monday, September 21 with Dr. Daneen Schick.  1. Please arrive at the [facility] (Main Entrance A) at [facility]: [address] [place], [place] [address] at 7:00 AM (This time is two hours  before your procedure to ensure your preparation). Free valet parking service is available.   Special note: Every effort is made to have your procedure done on time. Please understand that emergencies sometimes delay scheduled procedures.  2. Diet: Do not eat solid foods after midnight.  The patient may have clear liquids until 5am upon the day of the procedure.  3. Labs: You will need to have blood drawn on Friday, September 18 at [facility]. You will do your covid screening test afterwards. You do not need to be fasting for lab work. After your covid test, you will need to go home and quarantine until you have your heart cath.  4. Medication instructions in preparation for your procedure: Please hold your hydrochlorothiazide and potassium on the morning of your cath. You may take the rest of your medications.   Contrast Allergy: No  On the morning of your procedure, take your Aspirin 81 mg and any morning medicines NOT listed above.  You may use sips of water.  5. Plan for one night stay--bring personal belongings. 6. Bring a current list of your medications and current insurance cards. 7. You MUST have a responsible person to drive you home. 8. Someone MUST be with you the first 24 hours after you arrive home or your discharge will be delayed. 9. Please wear clothes that are easy to get on and off and wear slip-on shoes.  Thank you for allowing [place] to care for you!    -- [place] [facility]

## 2019-05-27 NOTE — Telephone Encounter (Signed)
Pre-cert Verification for the following procedure    Left heart cath & possible dx: accelerating angina on Monday, June 01, 2019 @9 :00 am with Dr. Tamala Julian

## 2019-05-28 ENCOUNTER — Encounter: Payer: Self-pay | Admitting: *Deleted

## 2019-05-29 ENCOUNTER — Other Ambulatory Visit: Payer: Self-pay

## 2019-05-29 ENCOUNTER — Other Ambulatory Visit (HOSPITAL_COMMUNITY)
Admission: RE | Admit: 2019-05-29 | Discharge: 2019-05-29 | Disposition: A | Discharge status: Home / Self Care | Payer: Medicare Other | Source: Ambulatory Visit | Attending: Interventional Cardiology | Admitting: Interventional Cardiology

## 2019-05-29 ENCOUNTER — Other Ambulatory Visit (HOSPITAL_COMMUNITY)
Admission: RE | Admit: 2019-05-29 | Discharge: 2019-05-29 | Disposition: A | Discharge status: Home / Self Care | Payer: Medicare Other | Source: Ambulatory Visit | Attending: Cardiology | Admitting: Cardiology

## 2019-05-29 ENCOUNTER — Telehealth: Payer: Self-pay | Admitting: *Deleted

## 2019-05-29 DIAGNOSIS — Z20828 Contact with and (suspected) exposure to other viral communicable diseases: Secondary | ICD-10-CM | POA: Insufficient documentation

## 2019-05-29 LAB — CBC
HCT: 42.9 % (ref 36.0–46.0)
Hemoglobin: 14.3 g/dL (ref 12.0–15.0)
MCH: 32.1 pg (ref 26.0–34.0)
MCHC: 33.3 g/dL (ref 30.0–36.0)
MCV: 96.4 fL (ref 80.0–100.0)
Platelets: 239 10*3/uL (ref 150–400)
RBC: 4.45 MIL/uL (ref 3.87–5.11)
RDW: 11.5 % (ref 11.5–15.5)
WBC: 7.6 10*3/uL (ref 4.0–10.5)
nRBC: 0 % (ref 0.0–0.2)

## 2019-05-29 LAB — BASIC METABOLIC PANEL
Anion gap: 7 (ref 5–15)
BUN: 16 mg/dL (ref 8–23)
CO2: 28 mmol/L (ref 22–32)
Calcium: 9.6 mg/dL (ref 8.9–10.3)
Chloride: 102 mmol/L (ref 98–111)
Creatinine, Ser: 0.65 mg/dL (ref 0.44–1.00)
GFR calc Af Amer: >60 mL/min (ref >60)
GFR calc non Af Amer: >60 mL/min (ref >60)
Glucose, Bld: 87 mg/dL (ref 70–99)
Potassium: 4 mmol/L (ref 3.5–5.1)
Sodium: 137 mmol/L (ref 135–145)

## 2019-05-29 LAB — SARS CORONAVIRUS 2 (TAT 6-24 HRS): SARS Coronavirus 2: NEGATIVE

## 2019-05-29 NOTE — Telephone Encounter (Signed)
Reviewed all instructions below with patient who verbalized understanding and is agreeable to plan.   Pt contacted pre-catheterization scheduled at [facility] for: Left heart catheterization with Dr. Tamala Julian on 06/01/2019. Verified arrival time and place: [facility] at: 7:00 am.    No solid food after midnight prior to cath, clear liquids until 5 AM day of procedure. Contrast allergy: None Verified no diabetes medications.  AM meds can be  taken pre-cath with sip of water including: ASA 81 mg  Patient will hold hydrochlorothiazide and potassium chloride the day of the catheterization until after it is completed.   Confirmed patient has responsible person to drive home post procedure and observe 24 hours after arriving home: yes  Currently, due to Covid-19 pandemic, only one support person will be allowed with patient. Must be the same support person for that patient's entire stay, will be screened and required to wear a mask. They will be asked to wait in the waiting room for the duration of the patient's stay.  Patients are required to wear a mask when they enter the hospital.      COVID-19 Pre-Screening Questions:  . In the past 7 to 10 days have you had a cough,  shortness of breath, headache, congestion, fever (100 or greater) body aches, chills, sore throat, or sudden loss of taste or sense of smell? No . Have you been around anyone with known Covid 19? No . Have you been around anyone who is awaiting Covid 19 test results in the past 7 to 10 days? No . Have you been around anyone who has been exposed to Covid 19, or has mentioned symptoms of Covid 19 within the past 7 to 10 days? No

## 2019-05-31 ENCOUNTER — Telehealth: Payer: Self-pay | Admitting: *Deleted

## 2019-05-31 NOTE — H&P (Signed)
ADDENDUM: From 09/2018 CT FFR was done.  FFR 0.78 distal LAD. This suggests a borderline hemodynamically significant mid LAD stenosis.  Heartflow 3 D images appear to show diffuse disease without focal stenosis

## 2019-05-31 NOTE — Telephone Encounter (Signed)
Reviewed negative covid19 results. No questions asked. 

## 2019-06-01 ENCOUNTER — Ambulatory Visit (HOSPITAL_COMMUNITY)
Admission: RE | Disposition: A | Discharge status: Home / Self Care | Payer: Medicare Other | Source: Home / Self Care | Attending: Interventional Cardiology

## 2019-06-01 ENCOUNTER — Other Ambulatory Visit: Payer: Self-pay

## 2019-06-01 ENCOUNTER — Ambulatory Visit (HOSPITAL_COMMUNITY)
Admission: RE | Admit: 2019-06-01 | Discharge: 2019-06-01 | Disposition: A | Discharge status: Home / Self Care | Payer: Medicare Other | Attending: Interventional Cardiology | Admitting: Interventional Cardiology

## 2019-06-01 DIAGNOSIS — Z8249 Family history of ischemic heart disease and other diseases of the circulatory system: Secondary | ICD-10-CM | POA: Insufficient documentation

## 2019-06-01 DIAGNOSIS — H269 Unspecified cataract: Secondary | ICD-10-CM | POA: Insufficient documentation

## 2019-06-01 DIAGNOSIS — R079 Chest pain, unspecified: Secondary | ICD-10-CM | POA: Diagnosis present

## 2019-06-01 DIAGNOSIS — I209 Angina pectoris, unspecified: Secondary | ICD-10-CM | POA: Diagnosis present

## 2019-06-01 DIAGNOSIS — I25119 Atherosclerotic heart disease of native coronary artery with unspecified angina pectoris: Secondary | ICD-10-CM | POA: Insufficient documentation

## 2019-06-01 DIAGNOSIS — Z888 Allergy status to other drugs, medicaments and biological substances status: Secondary | ICD-10-CM | POA: Insufficient documentation

## 2019-06-01 DIAGNOSIS — E785 Hyperlipidemia, unspecified: Secondary | ICD-10-CM | POA: Diagnosis present

## 2019-06-01 DIAGNOSIS — E782 Mixed hyperlipidemia: Secondary | ICD-10-CM | POA: Insufficient documentation

## 2019-06-01 DIAGNOSIS — I200 Unstable angina: Secondary | ICD-10-CM

## 2019-06-01 DIAGNOSIS — I25118 Atherosclerotic heart disease of native coronary artery with other forms of angina pectoris: Secondary | ICD-10-CM | POA: Diagnosis not present

## 2019-06-01 DIAGNOSIS — I2584 Coronary atherosclerosis due to calcified coronary lesion: Secondary | ICD-10-CM | POA: Diagnosis not present

## 2019-06-01 DIAGNOSIS — M8580 Other specified disorders of bone density and structure, unspecified site: Secondary | ICD-10-CM | POA: Diagnosis not present

## 2019-06-01 DIAGNOSIS — G4733 Obstructive sleep apnea (adult) (pediatric): Secondary | ICD-10-CM | POA: Diagnosis present

## 2019-06-01 DIAGNOSIS — M1990 Unspecified osteoarthritis, unspecified site: Secondary | ICD-10-CM | POA: Diagnosis not present

## 2019-06-01 DIAGNOSIS — Z7982 Long term (current) use of aspirin: Secondary | ICD-10-CM | POA: Insufficient documentation

## 2019-06-01 DIAGNOSIS — Z79899 Other long term (current) drug therapy: Secondary | ICD-10-CM | POA: Diagnosis not present

## 2019-06-01 DIAGNOSIS — I10 Essential (primary) hypertension: Secondary | ICD-10-CM | POA: Insufficient documentation

## 2019-06-01 HISTORY — PX: INTRAVASCULAR PRESSURE WIRE/FFR STUDY: CATH118243

## 2019-06-01 HISTORY — PX: LEFT HEART CATH AND CORONARY ANGIOGRAPHY: CATH118249

## 2019-06-01 LAB — POCT ACTIVATED CLOTTING TIME
Activated Clotting Time: 219 seconds
Activated Clotting Time: 219 seconds
Activated Clotting Time: 257 seconds

## 2019-06-01 SURGERY — LEFT HEART CATH AND CORONARY ANGIOGRAPHY
Anesthesia: LOCAL

## 2019-06-01 MED ORDER — SODIUM CHLORIDE 0.9 % WEIGHT BASED INFUSION
3.0000 mL/kg/h | INTRAVENOUS | Status: AC
  Administered 2019-06-01: 3 mL/kg/h via INTRAVENOUS

## 2019-06-01 MED ORDER — SODIUM CHLORIDE 0.9% FLUSH: 3.0000 mL | INTRAVENOUS | Status: DC | PRN

## 2019-06-01 MED ORDER — HEPARIN SODIUM (PORCINE) 1000 UNIT/ML IJ SOLN
INTRAMUSCULAR | Status: DC | PRN
  Administered 2019-06-01 (×2): 4000 [IU] via INTRAVENOUS
  Administered 2019-06-01: 2500 [IU] via INTRAVENOUS

## 2019-06-01 MED ORDER — SODIUM CHLORIDE 0.9 % IV SOLN: INTRAVENOUS | Status: AC

## 2019-06-01 MED ORDER — HEPARIN (PORCINE) IN NACL 1000-0.9 UT/500ML-% IV SOLN
INTRAVENOUS | Status: AC
  Filled 2019-06-01: qty 1000

## 2019-06-01 MED ORDER — ONDANSETRON HCL 4 MG/2ML IJ SOLN: 4.0000 mg | Freq: Four times a day (QID) | INTRAMUSCULAR | Status: DC | PRN

## 2019-06-01 MED ORDER — OXYCODONE HCL 5 MG PO TABS: 5.0000 mg | ORAL_TABLET | ORAL | Status: DC | PRN

## 2019-06-01 MED ORDER — HEPARIN (PORCINE) IN NACL 1000-0.9 UT/500ML-% IV SOLN
INTRAVENOUS | Status: DC | PRN
  Administered 2019-06-01 (×2): 500 mL

## 2019-06-01 MED ORDER — ADENOSINE (DIAGNOSTIC) 140MCG/KG/MIN
INTRAVENOUS | Status: DC | PRN
  Administered 2019-06-01: 140 ug/kg/min via INTRAVENOUS

## 2019-06-01 MED ORDER — SODIUM CHLORIDE 0.9 % WEIGHT BASED INFUSION: 1.0000 mL/kg/h | INTRAVENOUS | Status: DC

## 2019-06-01 MED ORDER — LIDOCAINE HCL (PF) 1 % IJ SOLN
INTRAMUSCULAR | Status: DC | PRN
  Administered 2019-06-01: 2 mL

## 2019-06-01 MED ORDER — NITROGLYCERIN 1 MG/10 ML FOR IR/CATH LAB
INTRA_ARTERIAL | Status: DC | PRN
  Administered 2019-06-01: 200 ug via INTRACORONARY

## 2019-06-01 MED ORDER — NITROGLYCERIN 1 MG/10 ML FOR IR/CATH LAB
INTRA_ARTERIAL | Status: AC
  Filled 2019-06-01: qty 10

## 2019-06-01 MED ORDER — SODIUM CHLORIDE 0.9% FLUSH: 3.0000 mL | Freq: Two times a day (BID) | INTRAVENOUS | Status: DC

## 2019-06-01 MED ORDER — SODIUM CHLORIDE 0.9 % IV SOLN: 250.0000 mL | INTRAVENOUS | Status: DC | PRN

## 2019-06-01 MED ORDER — ASPIRIN 81 MG PO CHEW: 81.0000 mg | CHEWABLE_TABLET | Freq: Every day | ORAL | Status: DC

## 2019-06-01 MED ORDER — FENTANYL CITRATE (PF) 100 MCG/2ML IJ SOLN
INTRAMUSCULAR | Status: AC
  Filled 2019-06-01: qty 2

## 2019-06-01 MED ORDER — LIDOCAINE HCL (PF) 1 % IJ SOLN
INTRAMUSCULAR | Status: AC
  Filled 2019-06-01: qty 30

## 2019-06-01 MED ORDER — LABETALOL HCL 5 MG/ML IV SOLN: 10.0000 mg | INTRAVENOUS | Status: DC | PRN

## 2019-06-01 MED ORDER — ADENOSINE 12 MG/4ML IV SOLN
INTRAVENOUS | Status: AC
  Filled 2019-06-01: qty 16

## 2019-06-01 MED ORDER — ACETAMINOPHEN 325 MG PO TABS: 650.0000 mg | ORAL_TABLET | ORAL | Status: DC | PRN

## 2019-06-01 MED ORDER — HEPARIN SODIUM (PORCINE) 1000 UNIT/ML IJ SOLN
INTRAMUSCULAR | Status: AC
  Filled 2019-06-01: qty 1

## 2019-06-01 MED ORDER — VERAPAMIL HCL 2.5 MG/ML IV SOLN
INTRAVENOUS | Status: AC
  Filled 2019-06-01: qty 2

## 2019-06-01 MED ORDER — MIDAZOLAM HCL 2 MG/2ML IJ SOLN
INTRAMUSCULAR | Status: DC | PRN
  Administered 2019-06-01 (×2): 1 mg via INTRAVENOUS

## 2019-06-01 MED ORDER — IOHEXOL 350 MG/ML SOLN
INTRAVENOUS | Status: DC | PRN
  Administered 2019-06-01: 11:00:00 160 mL

## 2019-06-01 MED ORDER — ASPIRIN 81 MG PO CHEW: 81.0000 mg | CHEWABLE_TABLET | ORAL | Status: DC

## 2019-06-01 MED ORDER — FENTANYL CITRATE (PF) 100 MCG/2ML IJ SOLN
INTRAMUSCULAR | Status: DC | PRN
  Administered 2019-06-01 (×2): 25 ug via INTRAVENOUS

## 2019-06-01 MED ORDER — MIDAZOLAM HCL 2 MG/2ML IJ SOLN
INTRAMUSCULAR | Status: AC
  Filled 2019-06-01: qty 2

## 2019-06-01 MED ORDER — HYDRALAZINE HCL 20 MG/ML IJ SOLN: 10.0000 mg | INTRAMUSCULAR | Status: DC | PRN

## 2019-06-01 SURGICAL SUPPLY — 14 items
CATH 5FR JL3.5 JR4 ANG PIG MP (CATHETERS) ×1 IMPLANT
CATH VISTA GUIDE 6FR XBLAD3.0 (CATHETERS) ×1 IMPLANT
CATH VISTA GUIDE 6FR XBLAD3.5 (CATHETERS) ×1 IMPLANT
DEVICE RAD COMP TR BAND LRG (VASCULAR PRODUCTS) ×1 IMPLANT
GLIDESHEATH SLEND A-KIT 6F 22G (SHEATH) ×1 IMPLANT
GUIDEWIRE INQWIRE 1.5J.035X260 (WIRE) IMPLANT
GUIDEWIRE PRESSURE COMET II (WIRE) ×1 IMPLANT
INQWIRE 1.5J .035X260CM (WIRE) ×2
KIT ESSENTIALS PG (KITS) ×1 IMPLANT
KIT HEART LEFT (KITS) ×2 IMPLANT
PACK CARDIAC CATHETERIZATION (CUSTOM PROCEDURE TRAY) ×2 IMPLANT
SHEATH PROBE COVER 6X72 (BAG) ×1 IMPLANT
TRANSDUCER W/STOPCOCK (MISCELLANEOUS) ×2 IMPLANT
TUBING CIL FLEX 10 FLL-RA (TUBING) ×2 IMPLANT

## 2019-06-01 NOTE — Progress Notes (Signed)
Patient came to short stay post procedure and presented with a right radial hematoma. Nurse from cath lab held pressure and a pressure cuff was applied to dissolve the hematoma. I received report once the issue was resolved and the patients radial site is stable.

## 2019-06-01 NOTE — Discharge Instructions (Signed)
Radial Site Care ° °This sheet gives you information about how to care for yourself after your procedure. Your health care provider may also give you more specific instructions. If you have problems or questions, contact your health care provider. °What can I expect after the procedure? °After the procedure, it is common to have: °· Bruising and tenderness at the catheter insertion area. °Follow these instructions at home: °Medicines °· Take over-the-counter and prescription medicines only as told by your health care provider. °Insertion site care °· Follow instructions from your health care provider about how to take care of your insertion site. Make sure you: °? Wash your hands with soap and water before you change your bandage (dressing). If soap and water are not available, use hand sanitizer. °? Change your dressing as told by your health care provider. °? Leave stitches (sutures), skin glue, or adhesive strips in place. These skin closures may need to stay in place for 2 weeks or longer. If adhesive strip edges start to loosen and curl up, you may trim the loose edges. Do not remove adhesive strips completely unless your health care provider tells you to do that. °· Check your insertion site every day for signs of infection. Check for: °? Redness, swelling, or pain. °? Fluid or blood. °? Pus or a bad smell. °? Warmth. °· Do not take baths, swim, or use a hot tub until your health care provider approves. °· You may shower 24-48 hours after the procedure, or as directed by your health care provider. °? Remove the dressing and gently wash the site with plain soap and water. °? Pat the area dry with a clean towel. °? Do not rub the site. That could cause bleeding. °· Do not apply powder or lotion to the site. °Activity ° °· For 24 hours after the procedure, or as directed by your health care provider: °? Do not flex or bend the affected arm. °? Do not push or pull heavy objects with the affected arm. °? Do not  drive yourself home from the hospital or clinic. You may drive 24 hours after the procedure unless your health care provider tells you not to. °? Do not operate machinery or power tools. °· Do not lift anything that is heavier than 10 lb (4.5 kg), or the limit that you are told, until your health care provider says that it is safe. °· Ask your health care provider when it is okay to: °? Return to work or school. °? Resume usual physical activities or sports. °? Resume sexual activity. °General instructions °· If the catheter site starts to bleed, raise your arm and put firm pressure on the site. If the bleeding does not stop, get help right away. This is a medical emergency. °· If you went home on the same day as your procedure, a responsible adult should be with you for the first 24 hours after you arrive home. °· Keep all follow-up visits as told by your health care provider. This is important. °Contact a health care provider if: °· You have a fever. °· You have redness, swelling, or yellow drainage around your insertion site. °Get help right away if: °· You have unusual pain at the radial site. °· The catheter insertion area swells very fast. °· The insertion area is bleeding, and the bleeding does not stop when you hold steady pressure on the area. °· Your arm or hand becomes pale, cool, tingly, or numb. °These symptoms may represent a serious problem   that is an emergency. Do not wait to see if the symptoms will go away. Get medical help right away. Call your local emergency services (911 in the [place] Do not drive yourself to the hospital. °Summary °· After the procedure, it is common to have bruising and tenderness at the site. °· Follow instructions from your health care provider about how to take care of your radial site wound. Check the wound every day for signs of infection. °· Do not lift anything that is heavier than 10 lb (4.5 kg), or the limit that you are told, until your health care provider says  that it is safe. °This information is not intended to replace advice given to you by your health care provider. Make sure you discuss any questions you have with your health care provider. °Document Released: 09/29/2010 Document Revised: 10/02/2017 Document Reviewed: 10/02/2017 °Elsevier Patient Education © 2020 [employer] ° °

## 2019-06-01 NOTE — CV Procedure (Signed)
   Coronary angiography left ventriculography with DFR/FFR via right radial approach using real-time vascular ultrasound for guidance.  Segmental mid LAD 50% calcified segment followed by a relatively focal 70% stenosis.  DFR 0.89 and 0.90.  FFR with adenosine infusion 0.82.  This is consistent with the FFR identified in January by CT  Coronary arteries otherwise widely patent.  Normal LV function.  Aggressive risk factor modification  If progressive symptoms, could consider PCI despite absence of hemodynamic proof that lesion is significant if clinical circumstances warrant.  If treated would need orbital or rotational atherectomy and likely a very long stent to cover the entire calcified segment.

## 2019-06-01 NOTE — Progress Notes (Signed)
Client states Dr Tamala Julian in earlier and checked right arm; TR band off and no bleeding or hematoma, right arm bruised; Dr Tamala Julian notified and ok to d/c home

## 2019-06-01 NOTE — Interval H&P Note (Signed)
Cath Lab Visit (complete for each Cath Lab visit)  Clinical Evaluation Leading to the Procedure:   ACS: No.  Non-ACS:    Anginal Classification: CCS III  Anti-ischemic medical therapy: Maximal Therapy (2 or more classes of medications)  Non-Invasive Test Results: No non-invasive testing performed  Prior CABG: No previous CABG      History and Physical Interval Note:  06/01/2019 8:25 AM  Angela [name]  has presented today for surgery, with the diagnosis of Angina.  The various methods of treatment have been discussed with the patient and family. After consideration of risks, benefits and other options for treatment, the patient has consented to  Procedure(s): LEFT HEART CATH AND CORONARY ANGIOGRAPHY (N/A) as a surgical intervention.  The patient's history has been reviewed, patient examined, no change in status, stable for surgery.  I have reviewed the patient's chart and labs.  Questions were answered to the patient's satisfaction.     Belva Crome III

## 2019-06-02 ENCOUNTER — Encounter: Payer: Medicare Other | Admitting: Family Medicine

## 2019-06-02 ENCOUNTER — Ambulatory Visit: Payer: Medicare Other | Admitting: Podiatry

## 2019-06-02 ENCOUNTER — Ambulatory Visit: Payer: Medicare Other

## 2019-06-02 ENCOUNTER — Encounter (HOSPITAL_COMMUNITY): Payer: Self-pay | Admitting: Interventional Cardiology

## 2019-06-02 MED FILL — Verapamil HCl IV Soln 2.5 MG/ML: INTRAVENOUS | Qty: 2 | Status: AC

## 2019-06-10 ENCOUNTER — Other Ambulatory Visit: Payer: Self-pay | Admitting: Student

## 2019-06-10 ENCOUNTER — Other Ambulatory Visit: Payer: Self-pay | Admitting: Family Medicine

## 2019-06-10 ENCOUNTER — Telehealth: Payer: Self-pay | Admitting: Cardiology

## 2019-06-10 DIAGNOSIS — Z1231 Encounter for screening mammogram for malignant neoplasm of breast: Secondary | ICD-10-CM

## 2019-06-10 NOTE — Telephone Encounter (Signed)
Pt is having swelling and redness where her IV site was for her Cath last week  Please call [phone]

## 2019-06-11 NOTE — Telephone Encounter (Signed)
Returned pt. Call. Pt states that she had her heart cath on October 21st and she is still having redness and swelling at the cath site. She stated it is still a little painful. She has not an a fever. She would like to come in for it to be looked at. This is an [place] pt. I will forward to their triage as Dr. Domenic Polite is there tomorrow.

## 2019-06-11 NOTE — Telephone Encounter (Signed)
Called pt. No answer. Unable to leave message.  

## 2019-06-11 NOTE — Telephone Encounter (Signed)
Thank you for the pictures - I hope you saw my answer to your initial request. This area could be a hematoma based on appearance and should get better slowly. If it is enlarging, we should take a look at it. Otherwise could try warm compress at this point which might help it loosen up and be less sore.

## 2019-06-11 NOTE — Telephone Encounter (Signed)
Returning call.

## 2019-06-12 ENCOUNTER — Ambulatory Visit: Payer: Medicare Other | Admitting: *Deleted

## 2019-06-12 ENCOUNTER — Other Ambulatory Visit: Payer: Self-pay

## 2019-06-12 DIAGNOSIS — Z9889 Other specified postprocedural states: Secondary | ICD-10-CM

## 2019-06-12 NOTE — Progress Notes (Signed)
Patient came by office for nurse to check cath site right forearm.  She does have large black / blue bruising that extends up the forearm.  No numbness, tingling, warmth or streaking noted to that area.  No knots felt at the site.  Good radial pulse noted.  Does c/o not being able to bend wrist back & forth without that hurting.  Discussed with Dr. Domenic Polite - he suggests warm compress & Tylenol for the pain.  Likely she has hematoma in that forearm.  Patient already has follow up scheduled with Dr. Domenic Polite for 07/01/2019.

## 2019-06-12 NOTE — Telephone Encounter (Signed)
See MyChart message correspondence

## 2019-06-14 ENCOUNTER — Other Ambulatory Visit: Payer: Self-pay | Admitting: Family Medicine

## 2019-06-14 DIAGNOSIS — I10 Essential (primary) hypertension: Secondary | ICD-10-CM

## 2019-06-14 DIAGNOSIS — R079 Chest pain, unspecified: Secondary | ICD-10-CM

## 2019-07-01 ENCOUNTER — Encounter: Payer: Self-pay | Admitting: Cardiology

## 2019-07-01 ENCOUNTER — Telehealth: Payer: Self-pay | Admitting: *Deleted

## 2019-07-01 ENCOUNTER — Telehealth (INDEPENDENT_AMBULATORY_CARE_PROVIDER_SITE_OTHER): Payer: Medicare Other | Admitting: Cardiology

## 2019-07-01 DIAGNOSIS — I25119 Atherosclerotic heart disease of native coronary artery with unspecified angina pectoris: Secondary | ICD-10-CM

## 2019-07-01 DIAGNOSIS — E782 Mixed hyperlipidemia: Secondary | ICD-10-CM | POA: Diagnosis not present

## 2019-07-01 DIAGNOSIS — I10 Essential (primary) hypertension: Secondary | ICD-10-CM

## 2019-07-01 MED ORDER — ISOSORBIDE MONONITRATE ER 30 MG PO TB24: 30.0000 mg | ORAL_TABLET | Freq: Two times a day (BID) | ORAL | 2 refills | Status: DC

## 2019-07-01 NOTE — Patient Instructions (Addendum)
Medication Instructions:   Your physician recommends that you continue on your current medications as directed. Please refer to the Current Medication list given to you today.  Labwork:  NONE-Please have your lipid lab work sent to [place] when you have this done in November 2020  Testing/Procedures:  NONE  Follow-Up:  Your physician recommends that you schedule a follow-up appointment in: 4 months. You will receive a reminder letter in the mail in about 1-2 months reminding you to call and schedule your appointment. If you don't receive this letter, please contact our office.  Any Other Special Instructions Will Be Listed Below (If Applicable).  If you need a refill on your cardiac medications before your next appointment, please call your pharmacy.

## 2019-07-01 NOTE — Progress Notes (Signed)
Virtual Visit via Telephone Note   This visit type was conducted due to national recommendations for restrictions regarding the COVID-19 Pandemic (e.g. social distancing) in an effort to limit this patient's exposure and mitigate transmission in our community.  Due to her co-morbid illnesses, this patient is at least at moderate risk for complications without adequate follow up.  This format is felt to be most appropriate for this patient at this time.  The patient did not have access to video technology/had technical difficulties with video requiring transitioning to audio format only (telephone).  All issues noted in this document were discussed and addressed.  No physical exam could be performed with this format.  Please refer to the patient's chart for her  consent to telehealth for [facility].   Date:  07/01/2019   ID:  Angela [name], DOB Sep 10, 1946, MRN [id number]  Patient Location: Home Provider Location: Office  PCP:  Marin Olp, MD  Cardiologist:  Rozann Lesches, MD Electrophysiologist:  None   Evaluation Performed:  Follow-Up Visit  Chief Complaint:   Cardiac follow-up  History of Present Illness:    Angela [name] is a 73 y.o. female last seen in September and referred for diagnostic cardiac catheterization.  LAD disease is described below, she had a 65 to 75% calcified mid LAD stenosis with both DFR (0.9) and FFR (0.82).  It was felt that medical therapy was the best option at that time unless symptoms outstripped medical regimen.  It was mentioned that she would need to have a right femoral access procedure for PCI for better guide catheter support and would also need atherectomy due to degree of calcification.  Spoke by phone today.  Her radial access site has improved significantly by report, bruising has improved and prior hematoma as well.  She does not report progressive angina symptoms, seems to be overall reassured by the cardiac catheterization findings which  we discussed again today.  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  I did review her medications.  She has a history of intolerance to Lipitor previously, has been on moderate dose simvastatin and her last LDL was 99.  She is due to have follow-up lipid panel with PCP in a few weeks.  I talked with her about the possibility of switching to Crestor.  The patient does not have symptoms concerning for COVID-19 infection (fever, chills, cough, or new shortness of breath).    Past Medical History:  Diagnosis Date  . Arthritis   . BCC (basal cell carcinoma)   . Breast cancer ([place]) 2009  . Cataract   . Hyperlipidemia   . Hypertension   . Macular retinal cyst of right eye   . OSA (obstructive sleep apnea)    CPAP  . Osteopenia   . Personal history of radiation therapy    Past Surgical History:  Procedure Laterality Date  . BREAST EXCISIONAL BIOPSY    . BREAST LUMPECTOMY Left 06/2008  . BREAST LUMPECTOMY    . CATARACT EXTRACTION  2012  . COLONOSCOPY    . Eyelid surgery Bilateral   . INTRAVASCULAR PRESSURE WIRE/FFR STUDY N/A 06/01/2019   Procedure: INTRAVASCULAR PRESSURE WIRE/FFR STUDY;  Surgeon: Belva Crome, MD;  Location: [place] [facility];  Service: Cardiovascular;  Laterality: N/A;  . LEFT HEART CATH AND CORONARY ANGIOGRAPHY N/A 06/01/2019   Procedure: LEFT HEART CATH AND CORONARY ANGIOGRAPHY;  Surgeon: Belva Crome, MD;  Location: [place] [facility];  Service: Cardiovascular;  Laterality: N/A;  . MASS EXCISION  03/2008   Appendix  . SKIN CANCER EXCISION    . TONSILLECTOMY       Current Meds  Medication Sig  . acetaminophen (TYLENOL) 500 MG tablet Take 500 mg by mouth every 6 (six) hours as needed for moderate pain or headache.  Marland Kitchen aspirin 81 MG tablet Take 81 mg by mouth every evening.   . Calcium Carbonate-Vitamin D (CALTRATE 600+D) 600-400 MG-UNIT per tablet Take 2 tablets by mouth every evening.   . Cholecalciferol (VITAMIN D3) 50 MCG (2000 UT) capsule  Take 2,000 Units by mouth every evening.   . Coenzyme Q10 (COQ-10) 100 MG CAPS Take 100 mg by mouth daily.   Marland Kitchen denosumab (PROLIA) 60 MG/ML SOLN injection Inject 60 mg into the skin every 6 (six) months.   . fish oil-omega-3 fatty acids 1000 MG capsule Take 1 g by mouth every evening.   . hydrochlorothiazide (HYDRODIURIL) 25 MG tablet TAKE 1 TABLET BY MOUTH EVERY DAY  . isosorbide mononitrate (IMDUR) 30 MG 24 hr tablet Take 1 tablet (30 mg total) by mouth 2 (two) times daily.  . metoprolol succinate (TOPROL-XL) 50 MG 24 hr tablet Take 1 tablet (50 mg total) by mouth every evening.  . Multiple Vitamin (MULTIVITAMIN) tablet Take 1 tablet by mouth every evening.   . nitroGLYCERIN (NITROSTAT) 0.4 MG SL tablet Place 1 tablet (0.4 mg total) under the tongue every 5 (five) minutes as needed for chest pain.  . potassium chloride SA (KLOR-CON) 20 MEQ tablet TAKE 1 TABLET BY MOUTH EVERY DAY  . simvastatin (ZOCOR) 40 MG tablet Take 1 tablet (40 mg total) by mouth at bedtime.  . vitamin C (ASCORBIC ACID) 500 MG tablet Take 500 mg by mouth every evening.   . [DISCONTINUED] isosorbide mononitrate (IMDUR) 30 MG 24 hr tablet Take 30 mg by mouth 2 (two) times daily.      Allergies:   Lisinopril and Statins   Social History   Tobacco Use  . Smoking status: Never Smoker  . Smokeless tobacco: Never Used  Substance Use Topics  . Alcohol use: Yes    Alcohol/week: 3.0 standard drinks    Types: 3 Glasses of wine per week    Comment: one glass a week  . Drug use: No     Family Hx: The patient's family history includes Heart attack in her father and mother; Heart disease in her father; Hypertension in her father and mother. There is no history of Colon cancer, Stroke, Esophageal cancer, Rectal cancer, or Stomach cancer.  ROS:   Please see the history of present illness. All other systems reviewed and are negative.   Prior CV studies:   The following studies were reviewed today:  Cardiac catheterization  06/01/2019:  Right dominant coronary anatomy.  Moderate to heavy LAD and circumflex calcification.  Widely patent left main.  The proximal to mid LAD is heavily calcified and contains segmental 50% tubular narrowing over approximately 20 mm.  The mid LAD contains relatively focal calcified 65 to 75% stenosis.  The LAD beyond this segment is widely patent.  The first diagonal contains 60% ostial narrowing and arises from the calcified tubular stenosis.  Both DFR (0.9) and FFR (0.82)  Circumflex coronary artery contains no significant obstruction.  The second obtuse marginal contains 50 to 60% ostial narrowing.  RCA is dominant with proximal 30 to 40% narrowing and mid 25% narrowing.  Normal left ventricular size and function.  Recommendations:   Aggressive risk  factor modification including consideration of PCSK9 if unable to tolerate statins and the addition of VASCEPA.  If we entertain intervention on the LAD despite absence of hemodynamic data for significant stenosis, we need to be performed from the right femoral for better guide catheter support.  Would likely also perform atherectomy to ensure stent expansion.   Labs/Other Tests and Data Reviewed:    EKG:  An ECG dated 06/01/2019 was personally reviewed today and demonstrated:  Sinus bradycardia.  Recent Labs: 08/18/2018: B Natriuretic Peptide 43.0 03/02/2019: ALT 35 05/29/2019: BUN 16; Creatinine, Ser 0.65; Hemoglobin 14.3; Platelets 239; Potassium 4.0; Sodium 137   Recent Lipid Panel Lab Results  Component Value Date/Time   CHOL 169 03/24/2018 08:11 AM   CHOL 169 01/14/2017 08:35 AM   TRIG 162.0 (H) 03/24/2018 08:11 AM   HDL 37.50 (L) 03/24/2018 08:11 AM   HDL 34 (L) 01/14/2017 08:35 AM   CHOLHDL 4 03/24/2018 08:11 AM   LDLCALC 99 03/24/2018 08:11 AM   LDLCALC 90 01/14/2017 08:35 AM   LDLDIRECT 170.3 07/09/2013 08:13 AM    Wt Readings from Last 3 Encounters:  07/01/19 166 lb (75.3 kg)  06/01/19 166 lb (75.3  kg)  05/27/19 168 lb (76.2 kg)     Objective:    Vital Signs:  BP 132/87   Pulse 64   Ht 5\' 6"  (1.676 m)   Wt 166 lb (75.3 kg)   LMP  (LMP Unknown)   BMI 26.79 kg/m    Patient spoke in full sentences, not short of breath. No udible wheezing or coughing. Speech pattern normal.  ASSESSMENT & PLAN:    1.  Calcified LAD disease with moderate mid LAD stenosis, not hemodynamically significant at cardiac catheterization and likely experiencing angina at least in part due to microvascular disease.  Plan is to continue medical therapy at this point.  Continue Imdur 30 mg twice daily, prescription updated.  She will have her lipids updated with PCP in a few weeks and we may consider switching to Crestor for more aggressive LDL management.  2.  Mixed hyperlipidemia with history of intolerance to Lipitor.  She is on moderate dose simvastatin and her last LDL was 99.  Follow-up lipid panel with PCP and consider switching to Crestor.  We will probably start with Crestor 10 mg every other day and advance if tolerated.  3.  Essential hypertension by history, systolic is in the [id number] today.  No changes made.  COVID-19 Education: The signs and symptoms of COVID-19 were discussed with the patient and how to seek care for testing (follow up with PCP or arrange E-visit).  The importance of social distancing was discussed today.  Time:   Today, I have spent 16 minutes with the patient with telehealth technology discussing the above problems.     Medication Adjustments/Labs and Tests Ordered: Current medicines are reviewed at length with the patient today.  Concerns regarding medicines are outlined above.   Tests Ordered: No orders of the defined types were placed in this encounter.   Medication Changes: Meds ordered this encounter  Medications  . isosorbide mononitrate (IMDUR) 30 MG 24 hr tablet    Sig: Take 1 tablet (30 mg total) by mouth 2 (two) times daily.    Dispense:  180 tablet     Refill:  2    Follow Up:  In Person 4 months in the [place] office.  Signed, Rozann Lesches, MD  07/01/2019 4:04 PM    [place]

## 2019-07-01 NOTE — Telephone Encounter (Signed)
Patient verbally consented for telehealth visits with [facility] and understands that her insurance company will be billed for the encounter.   

## 2019-07-22 ENCOUNTER — Encounter: Payer: Self-pay | Admitting: Family Medicine

## 2019-07-23 ENCOUNTER — Encounter: Payer: Self-pay | Admitting: Family Medicine

## 2019-07-23 ENCOUNTER — Other Ambulatory Visit: Payer: Self-pay

## 2019-07-24 ENCOUNTER — Ambulatory Visit (INDEPENDENT_AMBULATORY_CARE_PROVIDER_SITE_OTHER): Payer: Medicare Other

## 2019-07-24 ENCOUNTER — Ambulatory Visit (INDEPENDENT_AMBULATORY_CARE_PROVIDER_SITE_OTHER): Payer: Medicare Other | Admitting: Family Medicine

## 2019-07-24 ENCOUNTER — Encounter: Payer: Self-pay | Admitting: Family Medicine

## 2019-07-24 VITALS — BP 140/74 | Temp 98.2°F | Ht 66.0 in | Wt 168.7 lb

## 2019-07-24 VITALS — BP 134/84 | HR 55 | Temp 98.2°F | Ht 66.0 in | Wt 168.6 lb

## 2019-07-24 DIAGNOSIS — E559 Vitamin D deficiency, unspecified: Secondary | ICD-10-CM

## 2019-07-24 DIAGNOSIS — I25119 Atherosclerotic heart disease of native coronary artery with unspecified angina pectoris: Secondary | ICD-10-CM | POA: Insufficient documentation

## 2019-07-24 DIAGNOSIS — E785 Hyperlipidemia, unspecified: Secondary | ICD-10-CM | POA: Diagnosis not present

## 2019-07-24 DIAGNOSIS — Z853 Personal history of malignant neoplasm of breast: Secondary | ICD-10-CM

## 2019-07-24 DIAGNOSIS — I10 Essential (primary) hypertension: Secondary | ICD-10-CM | POA: Diagnosis not present

## 2019-07-24 DIAGNOSIS — Z85828 Personal history of other malignant neoplasm of skin: Secondary | ICD-10-CM

## 2019-07-24 DIAGNOSIS — I2510 Atherosclerotic heart disease of native coronary artery without angina pectoris: Secondary | ICD-10-CM | POA: Insufficient documentation

## 2019-07-24 DIAGNOSIS — Z0000 Encounter for general adult medical examination without abnormal findings: Secondary | ICD-10-CM | POA: Diagnosis not present

## 2019-07-24 DIAGNOSIS — R739 Hyperglycemia, unspecified: Secondary | ICD-10-CM | POA: Diagnosis not present

## 2019-07-24 DIAGNOSIS — I25118 Atherosclerotic heart disease of native coronary artery with other forms of angina pectoris: Secondary | ICD-10-CM

## 2019-07-24 DIAGNOSIS — G4733 Obstructive sleep apnea (adult) (pediatric): Secondary | ICD-10-CM

## 2019-07-24 DIAGNOSIS — Z860100 Personal history of colon polyps, unspecified: Secondary | ICD-10-CM

## 2019-07-24 DIAGNOSIS — M8580 Other specified disorders of bone density and structure, unspecified site: Secondary | ICD-10-CM

## 2019-07-24 LAB — VITAMIN D 25 HYDROXY (VIT D DEFICIENCY, FRACTURES): VITD: 44.12 ng/mL (ref 30.00–100.00)

## 2019-07-24 LAB — POC URINALSYSI DIPSTICK (AUTOMATED)
Bilirubin, UA: NEGATIVE
Blood, UA: NEGATIVE
Glucose, UA: NEGATIVE
Ketones, UA: NEGATIVE
Nitrite, UA: NEGATIVE
Protein, UA: NEGATIVE
Spec Grav, UA: 1.015 (ref 1.010–1.025)
Urobilinogen, UA: 0.2 E.U./dL
pH, UA: 7.5 (ref 5.0–8.0)

## 2019-07-24 LAB — COMPREHENSIVE METABOLIC PANEL
ALT: 27 U/L (ref 0–35)
AST: 27 U/L (ref 0–37)
Albumin: 4.5 g/dL (ref 3.5–5.2)
Alkaline Phosphatase: 55 U/L (ref 39–117)
BUN: 12 mg/dL (ref 6–23)
CO2: 32 mEq/L (ref 19–32)
Calcium: 10 mg/dL (ref 8.4–10.5)
Chloride: 104 mEq/L (ref 96–112)
Creatinine, Ser: 0.7 mg/dL (ref 0.40–1.20)
GFR: 82.12 mL/min (ref >60.00)
Glucose, Bld: 95 mg/dL (ref 70–99)
Potassium: 4.5 mEq/L (ref 3.5–5.1)
Sodium: 142 mEq/L (ref 135–145)
Total Bilirubin: 0.5 mg/dL (ref 0.2–1.2)
Total Protein: 6.9 g/dL (ref 6.0–8.3)

## 2019-07-24 LAB — LIPID PANEL
Cholesterol: 148 mg/dL (ref 0–200)
HDL: 31.8 mg/dL — ABNORMAL LOW (ref >39.00)
LDL Cholesterol: 82 mg/dL (ref 0–99)
NonHDL: 116.18
Total CHOL/HDL Ratio: 5
Triglycerides: 171 mg/dL — ABNORMAL HIGH (ref 0.0–149.0)
VLDL: 34.2 mg/dL (ref 0.0–40.0)

## 2019-07-24 LAB — CBC WITH DIFFERENTIAL/PLATELET
Basophils Absolute: 0 10*3/uL (ref 0.0–0.1)
Basophils Relative: 0.5 % (ref 0.0–3.0)
Eosinophils Absolute: 0.1 10*3/uL (ref 0.0–0.7)
Eosinophils Relative: 1.8 % (ref 0.0–5.0)
HCT: 41.4 % (ref 36.0–46.0)
Hemoglobin: 14 g/dL (ref 12.0–15.0)
Lymphocytes Relative: 30.9 % (ref 12.0–46.0)
Lymphs Abs: 2.3 10*3/uL (ref 0.7–4.0)
MCHC: 33.7 g/dL (ref 30.0–36.0)
MCV: 94.8 fl (ref 78.0–100.0)
Monocytes Absolute: 0.6 10*3/uL (ref 0.1–1.0)
Monocytes Relative: 8.6 % (ref 3.0–12.0)
Neutro Abs: 4.3 10*3/uL (ref 1.4–7.7)
Neutrophils Relative %: 58.2 % (ref 43.0–77.0)
Platelets: 231 10*3/uL (ref 150.0–400.0)
RBC: 4.37 Mil/uL (ref 3.87–5.11)
RDW: 12.1 % (ref 11.5–15.5)
WBC: 7.3 10*3/uL (ref 4.0–10.5)

## 2019-07-24 LAB — HEMOGLOBIN A1C: Hgb A1c MFr Bld: 5.8 % (ref 4.6–6.5)

## 2019-07-24 NOTE — Patient Instructions (Signed)
Angela [name] , Thank you for taking time to come for your Medicare Wellness Visit. I appreciate your ongoing commitment to your health goals. Please review the following plan we discussed and let me know if I can assist you in the future.   Screening recommendations/referrals: Colorectal Screening: up to date; last 06/05/18 with Dr. Silverio Decamp  Mammogram: up to date; scheduled for 07/28/19 Bone Density: up to date; last 07/01/17 scheduled for 08/26/19  Vision and Dental Exams: Recommended annual ophthalmology exams for early detection of glaucoma and other disorders of the eye Recommended annual dental exams for proper oral hygiene  Vaccinations: Influenza vaccine: completed 06/12/19 Pneumococcal vaccine: up to date; last 11/12/14 Tdap vaccine: up to date; last 04/05/16 Shingles vaccine: Shingrix complted   Advanced directives:We have received a copy of your POA (Power of [place]) and/or Living Will. These documents can be located in your chart.  Goals: Recommend to drink at least 6-8 8oz glasses of water per day and consume a balanced diet rich in fresh fruits and vegetables.   Next appointment: Please schedule your Annual Wellness Visit with your Nurse Health Advisor in one year.  Preventive Care 35 Years and Older, Female Preventive care refers to lifestyle choices and visits with your health care provider that can promote health and wellness. What does preventive care include?  A yearly physical exam. This is also called an annual well check.  Dental exams once or twice a year.  Routine eye exams. Ask your health care provider how often you should have your eyes checked.  Personal lifestyle choices, including:  Daily care of your teeth and gums.  Regular physical activity.  Eating a healthy diet.  Avoiding tobacco and drug use.  Limiting alcohol use.  Practicing safe sex.  Taking low-dose aspirin every day if recommended by your health care provider.  Taking vitamin and  mineral supplements as recommended by your health care provider. What happens during an annual well check? The services and screenings done by your health care provider during your annual well check will depend on your age, overall health, lifestyle risk factors, and family history of disease. Counseling  Your health care provider may ask you questions about your:  Alcohol use.  Tobacco use.  Drug use.  Emotional well-being.  Home and relationship well-being.  Sexual activity.  Eating habits.  History of falls.  Memory and ability to understand (cognition).  Work and work Statistician.  Reproductive health. Screening  You may have the following tests or measurements:  Height, weight, and BMI.  Blood pressure.  Lipid and cholesterol levels. These may be checked every 5 years, or more frequently if you are over 6 years old.  Skin check.  Lung cancer screening. You may have this screening every year starting at age 10 if you have a 30-pack-year history of smoking and currently smoke or have quit within the past 15 years.  Fecal occult blood test (FOBT) of the stool. You may have this test every year starting at age 76.  Flexible sigmoidoscopy or colonoscopy. You may have a sigmoidoscopy every 5 years or a colonoscopy every 10 years starting at age 86.  Hepatitis C blood test.  Hepatitis B blood test.  Sexually transmitted disease (STD) testing.  Diabetes screening. This is done by checking your blood sugar (glucose) after you have not eaten for a while (fasting). You may have this done every 1-3 years.  Bone density scan. This is done to screen for osteoporosis. You may have this done  starting at age 41.  Mammogram. This may be done every 1-2 years. Talk to your health care provider about how often you should have regular mammograms. Talk with your health care provider about your test results, treatment options, and if necessary, the need for more tests. Vaccines   Your health care provider may recommend certain vaccines, such as:  Influenza vaccine. This is recommended every year.  Tetanus, diphtheria, and acellular pertussis (Tdap, Td) vaccine. You may need a Td booster every 10 years.  Zoster vaccine. You may need this after age 44.  Pneumococcal 13-valent conjugate (PCV13) vaccine. One dose is recommended after age 57.  Pneumococcal polysaccharide (PPSV23) vaccine. One dose is recommended after age 27. Talk to your health care provider about which screenings and vaccines you need and how often you need them. This information is not intended to replace advice given to you by your health care provider. Make sure you discuss any questions you have with your health care provider. Document Released: 09/23/2015 Document Revised: 05/16/2016 Document Reviewed: 06/28/2015 Elsevier Interactive Patient Education  2017 [employer] Prevention in the Home Falls can cause injuries. They can happen to people of all ages. There are many things you can do to make your home safe and to help prevent falls. What can I do on the outside of my home?  Regularly fix the edges of walkways and driveways and fix any cracks.  Remove anything that might make you trip as you walk through a door, such as a raised step or threshold.  Trim any bushes or trees on the path to your home.  Use bright outdoor lighting.  Clear any walking paths of anything that might make someone trip, such as rocks or tools.  Regularly check to see if handrails are loose or broken. Make sure that both sides of any steps have handrails.  Any raised decks and porches should have guardrails on the edges.  Have any leaves, snow, or ice cleared regularly.  Use sand or salt on walking paths during winter.  Clean up any spills in your garage right away. This includes oil or grease spills. What can I do in the bathroom?  Use night lights.  Install grab bars by the toilet and in the  tub and shower. Do not use towel bars as grab bars.  Use non-skid mats or decals in the tub or shower.  If you need to sit down in the shower, use a plastic, non-slip stool.  Keep the floor dry. Clean up any water that spills on the floor as soon as it happens.  Remove soap buildup in the tub or shower regularly.  Attach bath mats securely with double-sided non-slip rug tape.  Do not have throw rugs and other things on the floor that can make you trip. What can I do in the bedroom?  Use night lights.  Make sure that you have a light by your bed that is easy to reach.  Do not use any sheets or blankets that are too big for your bed. They should not hang down onto the floor.  Have a firm chair that has side arms. You can use this for support while you get dressed.  Do not have throw rugs and other things on the floor that can make you trip. What can I do in the kitchen?  Clean up any spills right away.  Avoid walking on wet floors.  Keep items that you use a lot in easy-to-reach places.  If you need to reach something above you, use a strong step stool that has a grab bar.  Keep electrical cords out of the way.  Do not use floor polish or wax that makes floors slippery. If you must use wax, use non-skid floor wax.  Do not have throw rugs and other things on the floor that can make you trip. What can I do with my stairs?  Do not leave any items on the stairs.  Make sure that there are handrails on both sides of the stairs and use them. Fix handrails that are broken or loose. Make sure that handrails are as long as the stairways.  Check any carpeting to make sure that it is firmly attached to the stairs. Fix any carpet that is loose or worn.  Avoid having throw rugs at the top or bottom of the stairs. If you do have throw rugs, attach them to the floor with carpet tape.  Make sure that you have a light switch at the top of the stairs and the bottom of the stairs. If you  do not have them, ask someone to add them for you. What else can I do to help prevent falls?  Wear shoes that:  Do not have high heels.  Have rubber bottoms.  Are comfortable and fit you well.  Are closed at the toe. Do not wear sandals.  If you use a stepladder:  Make sure that it is fully opened. Do not climb a closed stepladder.  Make sure that both sides of the stepladder are locked into place.  Ask someone to hold it for you, if possible.  Clearly mark and make sure that you can see:  Any grab bars or handrails.  First and last steps.  Where the edge of each step is.  Use tools that help you move around (mobility aids) if they are needed. These include:  Canes.  Walkers.  Scooters.  Crutches.  Turn on the lights when you go into a dark area. Replace any light bulbs as soon as they burn out.  Set up your furniture so you have a clear path. Avoid moving your furniture around.  If any of your floors are uneven, fix them.  If there are any pets around you, be aware of where they are.  Review your medicines with your doctor. Some medicines can make you feel dizzy. This can increase your chance of falling. Ask your doctor what other things that you can do to help prevent falls. This information is not intended to replace advice given to you by your health care provider. Make sure you discuss any questions you have with your health care provider. Document Released: 06/23/2009 Document Revised: 02/02/2016 Document Reviewed: 10/01/2014 Elsevier Interactive Patient Education  2017 [employer].

## 2019-07-24 NOTE — Progress Notes (Signed)
Subjective:   Angela [name] is a 73 y.o. female who presents for an Initial Medicare Annual Wellness Visit.  Review of Systems     Cardiac Risk Factors include: advanced age (>73men, >2 women);hypertension    Objective:    Today's Vitals   07/24/19 1012  BP: 140/74  Temp: 98.2 F (36.8 C)  Weight: 168 lb 10.4 oz (76.5 kg)  Height: 5\' 6"  (1.676 m)   Body mass index is 27.22 kg/m.  Advanced Directives 07/24/2019 06/01/2019 08/29/2018 08/18/2018 02/19/2018 08/21/2017 02/08/2017  Does Patient Have a Medical Advance Directive? Yes Yes Yes Yes Yes Yes Yes  Type of Paramedic of [place];Living will [place];Living will [place];Living will Living will;Healthcare Power of Attorney Living will;Healthcare Power of [place];Living will [place];Living will  Does patient want to make changes to medical advance directive? No - Patient declined - No - Patient declined - No - Patient declined No - Patient declined -  Copy of [place] in Chart? Yes - validated most recent copy scanned in chart (See row information) - No - copy requested - No - copy requested No - copy requested No - copy requested    Current Medications (verified) Outpatient Encounter Medications as of 07/24/2019  Medication Sig  . acetaminophen (TYLENOL) 500 MG tablet Take 500 mg by mouth every 6 (six) hours as needed for moderate pain or headache.  Marland Kitchen aspirin 81 MG tablet Take 81 mg by mouth every evening.   . Calcium Carbonate-Vitamin D (CALTRATE 600+D) 600-400 MG-UNIT per tablet Take 2 tablets by mouth every evening.   . Cholecalciferol (VITAMIN D3) 50 MCG (2000 UT) capsule Take 2,000 Units by mouth every evening.   . Coenzyme Q10 (COQ-10) 100 MG CAPS Take 100 mg by mouth daily.   Marland Kitchen denosumab (PROLIA) 60 MG/ML SOLN injection Inject 60 mg into the skin every 6 (six) months.   . fish oil-omega-3  fatty acids 1000 MG capsule Take 1 g by mouth every evening.   . hydrochlorothiazide (HYDRODIURIL) 25 MG tablet TAKE 1 TABLET BY MOUTH EVERY DAY  . isosorbide mononitrate (IMDUR) 30 MG 24 hr tablet Take 1 tablet (30 mg total) by mouth 2 (two) times daily.  . metoprolol succinate (TOPROL-XL) 50 MG 24 hr tablet Take 1 tablet (50 mg total) by mouth every evening.  . Multiple Vitamin (MULTIVITAMIN) tablet Take 1 tablet by mouth every evening.   . potassium chloride SA (KLOR-CON) 20 MEQ tablet TAKE 1 TABLET BY MOUTH EVERY DAY  . simvastatin (ZOCOR) 40 MG tablet Take 1 tablet (40 mg total) by mouth at bedtime.  . vitamin C (ASCORBIC ACID) 500 MG tablet Take 500 mg by mouth every evening.   Marland Kitchen ZINC-A-COLD MT Use as directed in the mouth or throat. Cold Ease  . nitroGLYCERIN (NITROSTAT) 0.4 MG SL tablet Place 1 tablet (0.4 mg total) under the tongue every 5 (five) minutes as needed for chest pain.   No facility-administered encounter medications on file as of 07/24/2019.     Allergies (verified) Lisinopril and Statins   History: Past Medical History:  Diagnosis Date  . Arthritis   . BCC (basal cell carcinoma)   . Breast cancer ([place]) 2009  . Cataract   . Hyperlipidemia   . Hypertension   . Macular retinal cyst of right eye   . OSA (obstructive sleep apnea)    CPAP  . Osteopenia   .  Personal history of radiation therapy    Past Surgical History:  Procedure Laterality Date  . BREAST EXCISIONAL BIOPSY    . BREAST LUMPECTOMY Left 06/2008  . BREAST LUMPECTOMY    . CATARACT EXTRACTION  2012  . COLONOSCOPY    . Eyelid surgery Bilateral   . INTRAVASCULAR PRESSURE WIRE/FFR STUDY N/A 06/01/2019   Procedure: INTRAVASCULAR PRESSURE WIRE/FFR STUDY;  Surgeon: Belva Crome, MD;  Location: [facility];  Service: Cardiovascular;  Laterality: N/A;  . LEFT HEART CATH AND CORONARY ANGIOGRAPHY N/A 06/01/2019   Procedure: LEFT HEART CATH AND CORONARY ANGIOGRAPHY;  Surgeon: Belva Crome, MD;   Location: [facility];  Service: Cardiovascular;  Laterality: N/A;  . MASS EXCISION  03/2008   Appendix  . SKIN CANCER EXCISION    . TONSILLECTOMY     Family History  Problem Relation Age of Onset  . Heart attack Mother   . Hypertension Mother   . Heart disease Father   . Heart attack Father   . Hypertension Father   . Colon cancer Neg Hx   . Stroke Neg Hx   . Esophageal cancer Neg Hx   . Rectal cancer Neg Hx   . Stomach cancer Neg Hx    Social History   Socioeconomic History  . Marital status: Married    Spouse name: Not on file  . Number of children: 3  . Years of education: Not on file  . Highest education level: Not on file  Occupational History  . Occupation: retired    Fish farm manager: RETIRED    Comment: works 1 day a week  Social Needs  . Financial resource strain: Not on file  . Food insecurity    Worry: Not on file    Inability: Not on file  . Transportation needs    Medical: Not on file    Non-medical: Not on file  Tobacco Use  . Smoking status: Never Smoker  . Smokeless tobacco: Never Used  Substance and Sexual Activity  . Alcohol use: Yes    Alcohol/week: 3.0 standard drinks    Types: 3 Glasses of wine per week    Comment: one glass a week  . Drug use: No  . Sexual activity: Not on file  Lifestyle  . Physical activity    Days per week: Not on file    Minutes per session: Not on file  . Stress: Not on file  Relationships  . Social Herbalist on phone: Not on file    Gets together: Not on file    Attends religious service: Not on file    Active member of club or organization: Not on file    Attends meetings of clubs or organizations: Not on file    Relationship status: Not on file  Other Topics Concern  . Not on file  Social History Narrative   Married. 2 daughters. 2 grandkids.       Retired from Materials engineer ed mostly [place]: travel, time with friends, eating out, lots of grandkids time in [place] given: Not Answered   Clinical Intake:  Pre-visit preparation completed: Yes  Pain : No/denies pain  Diabetes: No  How often do you need to have someone help you when you read instructions, pamphlets, or other written materials from your doctor or pharmacy?: 1 - Never  Interpreter Needed?: No  Information entered by :: Denman George LPN  Activities of Daily Living In your present state of health, do you have any difficulty performing the following activities: 07/24/2019  Hearing? N  Vision? N  Difficulty concentrating or making decisions? N  Walking or climbing stairs? N  Dressing or bathing? N  Doing errands, shopping? N  Preparing Food and eating ? N  Using the Toilet? N  In the past six months, have you accidently leaked urine? N  Do you have problems with loss of bowel control? N  Managing your Medications? N  Managing your Finances? N  Housekeeping or managing your Housekeeping? N  Some recent data might be hidden     Immunizations and Health Maintenance Immunization History  Administered Date(s) Administered  . Fluad Quad(high Dose 65+) 06/12/2019  . Hep A / Hep B 03/16/2013, 10/06/2013  . Hepatitis B, adult 04/17/2013  . Influenza Split 06/09/2012  . Influenza Whole 05/22/2011  . Influenza,inj,Quad PF,6+ Mos 05/27/2013  . Influenza-Unspecified 06/10/2014, 05/24/2015, 04/24/2016, 04/26/2017  . Pneumococcal Conjugate-13 11/12/2014  . Pneumococcal Polysaccharide-23 08/24/2008, 07/17/2013  . Td 04/19/2010  . Tdap 04/05/2016  . Zoster 11/17/2008  . Zoster Recombinat (Shingrix) 07/01/2017, 09/20/2017   Health Maintenance Due  Topic Date Due  . INFLUENZA VACCINE  04/11/2019    Patient Care Team: Marin Olp, MD as PCP - General (Family Medicine) Satira Sark, MD as PCP - Cardiology (Cardiology) Jovita Kussmaul, MD as Consulting Physician (General Surgery) Thea Silversmith, MD as Consulting Physician (Radiation  Oncology) Inda Castle, MD (Inactive) as Consulting Physician (Gastroenterology) Danice Goltz, MD as Consulting Physician (Ophthalmology) Zadie Rhine Clent Demark, MD as Consulting Physician (Ophthalmology) Chesley Mires, MD as Consulting Physician (Pulmonary Disease) Derek Jack, MD as Consulting Physician (Hematology) Gardiner Barefoot, DPM as Consulting Physician (Podiatry)  Indicate any recent Medical Services you may have received from other than [facility] providers in the past year (date may be approximate).     Assessment:   This is a routine wellness examination for [place].  Hearing/Vision screen No exam data present  Dietary issues and exercise activities discussed: Current Exercise Habits: Home exercise routine, Type of exercise: walking, Time (Minutes): 30, Frequency (Times/Week): 7, Weekly Exercise (Minutes/Week): 210, Intensity: Mild  Goals   None    Depression Screen PHQ 2/9 Scores 07/24/2019 03/28/2018 03/22/2017 02/21/2016 11/12/2014 11/03/2013 10/27/2012  PHQ - 2 Score 0 0 0 0 0 0 0    Fall Risk Fall Risk  07/24/2019 03/28/2018 03/22/2017 02/21/2016 11/12/2014  Falls in the past year? 0 No No No No  Number falls in past yr: 0 - - - -  Injury with Fall? 0 - - - -    Is the patient's home free of loose throw rugs in walkways, pet beds, electrical cords, etc?   yes      Grab bars in the bathroom? yes      Handrails on the stairs?   yes      Adequate lighting?   yes  Timed Get Up and Go Performed completed and within normal timeframe; no gait abnormalities noted   Cognitive Function: no cognitive concerns at this time    6CIT Screen 07/24/2019  What Year? 0 points  What month? 0 points  What time? 0 points  Count back from 20 0 points  Months in reverse 0 points  Repeat phrase 0 points  Total Score 0    Screening Tests Health Maintenance  Topic Date Due  . INFLUENZA VACCINE  04/11/2019  . MAMMOGRAM  07/24/2020  .  COLONOSCOPY  06/05/2021  . TETANUS/TDAP   04/05/2026  . DEXA SCAN  Completed  . Hepatitis C Screening  Completed  . PNA vac Low Risk Adult  Completed    Qualifies for Shingles Vaccine? Shingrix completed   Cancer Screenings: Lung: Low Dose CT Chest recommended if Age 9-80 years, 30 pack-year currently smoking OR have quit w/in 15years. Patient does not qualify. Breast: Up to date on Mammogram? Yes   Up to date of Bone Density/Dexa? Yes Colorectal: colonoscopy 06/05/18 with Dr. Silverio Decamp    Plan:  I have personally reviewed and addressed the Medicare Annual Wellness questionnaire and have noted the following in the patient's chart:  A. Medical and social history B. Use of alcohol, tobacco or illicit drugs  C. Current medications and supplements D. Functional ability and status E.  Nutritional status F.  Physical activity G. Advance directives H. List of other physicians I.  Hospitalizations, surgeries, and ER visits in previous 12 months J.  [place] such as hearing and vision if needed, cognitive and depression L. Referrals, records requested, and appointments- none   In addition, I have reviewed and discussed with patient certain preventive protocols, quality metrics, and best practice recommendations. A written personalized care plan for preventive services as well as general preventive health recommendations were provided to patient.   Signed,  Denman George, LPN  Nurse Health Advisor   Nurse Notes: no additional

## 2019-07-24 NOTE — Addendum Note (Signed)
Addended by: Tomi Likens on: 07/24/2019 11:33 AM   Modules accepted: Orders

## 2019-07-24 NOTE — Progress Notes (Signed)
Phone: [phone]   Subjective:  Patient presents today for their annual physical. Chief complaint-noted.   See problem oriented charting- ROS- full  review of systems was completed and negative except for: some left arm prickly sensation at times that responds to nitroglycerin  The following were reviewed and entered/updated in epic: Past Medical History:  Diagnosis Date   Arthritis    BCC (basal cell carcinoma)    Breast cancer ([place]) 2009   Cataract    Hyperlipidemia    Hypertension    Macular retinal cyst of right eye    OSA (obstructive sleep apnea)    CPAP   Osteopenia    Personal history of radiation therapy    Patient Active Problem List   Diagnosis Date Noted   History of breast cancer 03/20/2011    Priority: High   OSA (obstructive sleep apnea) 08/24/2015    Priority: Medium   Hyperglycemia 11/12/2014    Priority: Medium   Osteopenia determined by x-ray 06/04/2014    Priority: Medium   HTN (hypertension) 07/13/2008    Priority: Medium   Hyperlipidemia 07/09/2007    Priority: Medium   History of skin cancer 02/21/2016    Priority: Low   Chest discomfort 10/05/2014    Priority: Low   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    Priority: Low   Vitamin D deficiency 07/09/2007    Priority: Low   Accelerating angina ([place])    History of colon polyps 06/12/2018   Chest pain 12/21/2011   Past Surgical History:  Procedure Laterality Date   BREAST EXCISIONAL BIOPSY     BREAST LUMPECTOMY Left 06/2008   BREAST LUMPECTOMY     CATARACT EXTRACTION  2012   COLONOSCOPY     Eyelid surgery Bilateral    INTRAVASCULAR PRESSURE WIRE/FFR STUDY N/A 06/01/2019   Procedure: INTRAVASCULAR PRESSURE WIRE/FFR STUDY;  Surgeon: Belva Crome, MD;  Location: [facility];  Service: Cardiovascular;  Laterality: N/A;   LEFT HEART CATH AND CORONARY ANGIOGRAPHY N/A 06/01/2019   Procedure: LEFT HEART CATH AND CORONARY ANGIOGRAPHY;  Surgeon: Belva Crome, MD;  Location: [facility];  Service: Cardiovascular;  Laterality: N/A;   MASS EXCISION  03/2008   Appendix   SKIN CANCER EXCISION     TONSILLECTOMY      Family History  Problem Relation Age of Onset   Heart attack Mother    Hypertension Mother    Heart disease Father    Heart attack Father    Hypertension Father    Colon cancer Neg Hx    Stroke Neg Hx    Esophageal cancer Neg Hx    Rectal cancer Neg Hx    Stomach cancer Neg Hx     Medications- reviewed and updated Current Outpatient Medications  Medication Sig Dispense Refill   acetaminophen (TYLENOL) 500 MG tablet Take 500 mg by mouth every 6 (six) hours as needed for moderate pain or headache.     aspirin 81 MG tablet Take 81 mg by mouth every evening.      Calcium Carbonate-Vitamin D (CALTRATE 600+D) 600-400 MG-UNIT per tablet Take 2 tablets by mouth every evening.      Cholecalciferol (VITAMIN D3) 50 MCG (2000 UT) capsule Take 2,000 Units by mouth every evening.      Coenzyme Q10 (COQ-10) 100 MG CAPS Take 100 mg by mouth daily.      denosumab (PROLIA) 60 MG/ML SOLN injection Inject 60 mg into the skin every 6 (six) months.  fish oil-omega-3 fatty acids 1000 MG capsule Take 1 g by mouth every evening.      hydrochlorothiazide (HYDRODIURIL) 25 MG tablet TAKE 1 TABLET BY MOUTH EVERY DAY 90 tablet 1   isosorbide mononitrate (IMDUR) 30 MG 24 hr tablet Take 1 tablet (30 mg total) by mouth 2 (two) times daily. 180 tablet 2   metoprolol succinate (TOPROL-XL) 50 MG 24 hr tablet Take 1 tablet (50 mg total) by mouth every evening. 30 tablet 3   Multiple Vitamin (MULTIVITAMIN) tablet Take 1 tablet by mouth every evening.      potassium chloride SA (KLOR-CON) 20 MEQ tablet TAKE 1 TABLET BY MOUTH EVERY DAY 90 tablet 1   simvastatin (ZOCOR) 40 MG tablet Take 1 tablet (40 mg total) by mouth at bedtime. 90 tablet 3   vitamin C (ASCORBIC ACID) 500 MG tablet Take 500 mg by mouth every evening.       nitroGLYCERIN (NITROSTAT) 0.4 MG SL tablet Place 1 tablet (0.4 mg total) under the tongue every 5 (five) minutes as needed for chest pain. 25 tablet 6   ZINC-A-COLD MT Use as directed in the mouth or throat. Cold Ease     No current facility-administered medications for this visit.     Allergies-reviewed and updated Allergies  Allergen Reactions   Lisinopril Cough   Statins Other (See Comments)    Myalgia with lipitor- unproven She is able to tolerate simvastatin    Social History   Social History Narrative   Married. 2 daughters. 2 grandkids.       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 TM normal. Bridge of nose normal. Eyelids normal.  Neck: no thyromegaly or cervical lymphadenopathy  CV: RRR no murmurs rubs or gallops Lungs: CTAB no crackles, wheeze, rhonchi Abdomen: soft/nontender/nondistended/normal bowel sounds. No rebound or guarding.  Ext: no edema Skin: warm, dry Neuro: grossly normal, moves all extremities, PERRLA Breasts: normal appearance, no masses or tenderness    Assessment and Plan   73 y.o. female presenting for annual physical.  Health Maintenance counseling: 1. Anticipatory guidance: Patient counseled regarding regular dental exams yes q6 months, eye exams yes,  avoiding smoking and second hand smoke, limiting alcohol to 2 beverage per week.   2. Risk factor reduction:  Advised patient of need for regular exercise and diet rich and fruits and vegetables to reduce risk of heart attack and stroke. Exercise- walk 1 mile a day. Diet-eats at home. Tries to eat reasonably healthy diet Wt Readings from Last 3 Encounters:    07/24/19 168 lb 9.6 oz (76.5 kg)  07/24/19 168 lb 10.4 oz (76.5 kg)  07/01/19 166 lb (75.3 kg)  3. Immunizations/screenings/ancillary studies-already had flu shot this year.  Fully up-to-date  Immunization History  Administered Date(s) Administered   Fluad Quad(high Dose 65+) 06/12/2019   Hep A / Hep B 03/16/2013, 10/06/2013   Hepatitis B, adult 04/17/2013   Influenza Split 06/09/2012   Influenza Whole 05/22/2011   Influenza,inj,Quad PF,6+ Mos  05/27/2013   Influenza-Unspecified 06/10/2014, 05/24/2015, 04/24/2016, 04/26/2017   Pneumococcal Conjugate-13 11/12/2014   Pneumococcal Polysaccharide-23 08/24/2008, 07/17/2013   Td 04/19/2010   Tdap 04/05/2016   Zoster 11/17/2008   Zoster Recombinat (Shingrix) 07/01/2017, 09/20/2017  4. Cervical cancer screening-  Past age based screening 5. Breast cancer screening-History of breast cancer-  breast exam no and mammogram yes- scheduled next week. Does self exams. Will do breast exam today  6. Colon cancer screening - yes History of colon polyps sessile serrated. 3 year ofllow up from 05/2018 planned.  7. Skin cancer screening-History of skin cancer - sees derm once a year- has one spot on her scalp encouraged her to monitor and have derm look at next visit. advised regular sunscreen use.  8. Birth control/STD check- monogomous/postmenopausal 9. Osteoporosis screening - Osteopenia determined by x-ray-technically osteoporosis due to risk factors.  She is on Prolia and calcium and vitamin D-through Dr. Solon Palm from [facility] now gets Prolia through [facility] oncology schedule 10. Never smoker  Status of chronic or acute concerns   CAD with angina-chest pain-free on Imdur for most part- some prickly feeling in left arm relieved by nitroglycerin. Follows with Dr. Lorrine Kin visit October 21 virtual.  Found to have moderate mid LAD stenosis at catheterization.  Microvascular disease thought to be the cause of symptoms.    Hyperlipidemia, unspecified hyperlipidemia type-on simvastatin 40 mg.  Update lipid panel and discussed LDL goal 70 or less-cardiology wants [place] to consider Crestor-I think that is very reasonable. Will see him again in 3 months for recheck.   Essential hypertension-controlled on Imdur 30 mg, hydrochlorothiazide 25 mg.  Also has to take potassium . Home #s often in 120s, never above 140.   Hyperglycemia-looks good on January labs.  Update A1c again today  Lab Results  Component Value Date   HGBA1C   POC  5.3 09/30/2018   HGBA1C 6.0 03/24/2018   HGBA1C 5.5 10/29/2017   OSA (obstructive sleep apnea)-uses CPAP-through nasal appliance  Vitamin D deficiency-takes 2000 units daily.  Update vitamin D today   Recommended follow up:  6 month follow up or sooner if needed Future Appointments  Date Time Provider [place]  07/28/2019  1:00 PM GI-BCG MM 3 GI-BCGMM GI-BREAST CE  08/26/2019 10:30 AM AP-DG DEXA AP-DG [facility]  08/26/2019 11:10 AM AP-ACAPA LAB AP-ACAPA None  09/02/2019 10:30 AM Roger Shelter, FNP AP-ACAPA None  09/02/2019 11:00 AM AP-ACAPA INJ NURSE AP-ACAPA None  09/18/2019  9:45 AM Gardiner Barefoot, DPM [facility]  11/06/2019  2:00 PM Satira Sark, MD [facility]   Lab/Order associations: fasting   ICD-10-CM   1. Preventative health care  Z00.00   2. Hyperlipidemia, unspecified hyperlipidemia type  E78.5   3. Essential hypertension  I10   4. Hyperglycemia  R73.9   5. OSA (obstructive sleep apnea)  G47.33   6. History of breast cancer  Z85.3   7. Osteopenia determined by x-ray  M85.80   8. History of skin cancer  Z85.828   9. Vitamin D deficiency  E55.9   10. History of colon polyps  Z86.010    No orders of the defined types were placed in this encounter.  Return precautions advised.  Garret Reddish, MD

## 2019-07-24 NOTE — Progress Notes (Signed)
I have reviewed and agree with note, evaluation, plan.   Stephen Hunter, MD  

## 2019-07-24 NOTE — Patient Instructions (Addendum)
Recommended follow up:  6 month follow up or sooner if needed  No changes today  Please stop by lab before you go If you do not have mychart- we will call you about results within 5 business days of [place] receiving them.  If you have mychart- we will send your results within 3 business days of [place] receiving them.  If abnormal or we want to clarify a result, we will call or mychart you to make sure you receive the message.  If you have questions or concerns or don't hear within 5-7 days, please send [place] a message or call us.

## 2019-07-25 ENCOUNTER — Encounter: Payer: Self-pay | Admitting: Family Medicine

## 2019-07-27 ENCOUNTER — Other Ambulatory Visit: Payer: Self-pay

## 2019-07-27 DIAGNOSIS — R3915 Urgency of urination: Secondary | ICD-10-CM

## 2019-07-27 MED ORDER — ROSUVASTATIN CALCIUM 20 MG PO TABS: 20.0000 mg | ORAL_TABLET | Freq: Every day | ORAL | 3 refills | Status: DC

## 2019-07-28 ENCOUNTER — Other Ambulatory Visit: Payer: Self-pay

## 2019-07-28 ENCOUNTER — Other Ambulatory Visit: Payer: Medicare Other

## 2019-07-28 ENCOUNTER — Ambulatory Visit
Admission: RE | Admit: 2019-07-28 | Discharge: 2019-07-28 | Disposition: A | Discharge status: Home / Self Care | Payer: Medicare Other | Source: Ambulatory Visit | Attending: Family Medicine | Admitting: Family Medicine

## 2019-07-28 DIAGNOSIS — Z1231 Encounter for screening mammogram for malignant neoplasm of breast: Secondary | ICD-10-CM

## 2019-07-28 DIAGNOSIS — R3915 Urgency of urination: Secondary | ICD-10-CM

## 2019-07-30 LAB — URINE CULTURE
MICRO NUMBER:: 1109923
SPECIMEN QUALITY:: ADEQUATE

## 2019-08-10 ENCOUNTER — Encounter: Payer: Self-pay | Admitting: Family Medicine

## 2019-08-19 NOTE — Telephone Encounter (Signed)
Unless Dr. Yong Channel already planned to get a follow-up labs prior to our visit in February, let's go ahead and get an FLP and LFTs just prior to the February visit.

## 2019-08-20 ENCOUNTER — Other Ambulatory Visit: Payer: Self-pay | Admitting: *Deleted

## 2019-08-20 DIAGNOSIS — I10 Essential (primary) hypertension: Secondary | ICD-10-CM

## 2019-08-20 DIAGNOSIS — E782 Mixed hyperlipidemia: Secondary | ICD-10-CM

## 2019-08-25 NOTE — Telephone Encounter (Signed)
I called and spoke to patient. She states that she reached out to [employer] and gave them the insurance information. I told her to let me know it there was anything else that I could help her with.

## 2019-08-26 ENCOUNTER — Ambulatory Visit (HOSPITAL_COMMUNITY): Payer: Medicare Other | Admitting: Hematology

## 2019-08-26 ENCOUNTER — Other Ambulatory Visit (HOSPITAL_COMMUNITY): Payer: Medicare Other

## 2019-08-26 ENCOUNTER — Inpatient Hospital Stay (HOSPITAL_COMMUNITY): Payer: Medicare Other | Attending: Nurse Practitioner

## 2019-08-26 ENCOUNTER — Ambulatory Visit (HOSPITAL_COMMUNITY)
Admission: RE | Admit: 2019-08-26 | Discharge: 2019-08-26 | Disposition: A | Discharge status: Home / Self Care | Payer: Medicare Other | Source: Ambulatory Visit | Attending: Nurse Practitioner | Admitting: Nurse Practitioner

## 2019-08-26 ENCOUNTER — Other Ambulatory Visit: Payer: Self-pay

## 2019-08-26 DIAGNOSIS — Z7982 Long term (current) use of aspirin: Secondary | ICD-10-CM | POA: Diagnosis not present

## 2019-08-26 DIAGNOSIS — M8580 Other specified disorders of bone density and structure, unspecified site: Secondary | ICD-10-CM | POA: Diagnosis present

## 2019-08-26 DIAGNOSIS — C50912 Malignant neoplasm of unspecified site of left female breast: Secondary | ICD-10-CM

## 2019-08-26 DIAGNOSIS — Z9989 Dependence on other enabling machines and devices: Secondary | ICD-10-CM | POA: Insufficient documentation

## 2019-08-26 DIAGNOSIS — I10 Essential (primary) hypertension: Secondary | ICD-10-CM | POA: Insufficient documentation

## 2019-08-26 DIAGNOSIS — Z85828 Personal history of other malignant neoplasm of skin: Secondary | ICD-10-CM | POA: Diagnosis not present

## 2019-08-26 DIAGNOSIS — E785 Hyperlipidemia, unspecified: Secondary | ICD-10-CM | POA: Insufficient documentation

## 2019-08-26 DIAGNOSIS — Z79899 Other long term (current) drug therapy: Secondary | ICD-10-CM | POA: Insufficient documentation

## 2019-08-26 DIAGNOSIS — Z853 Personal history of malignant neoplasm of breast: Secondary | ICD-10-CM | POA: Insufficient documentation

## 2019-08-26 DIAGNOSIS — Z13820 Encounter for screening for osteoporosis: Secondary | ICD-10-CM | POA: Insufficient documentation

## 2019-08-26 DIAGNOSIS — Z923 Personal history of irradiation: Secondary | ICD-10-CM | POA: Diagnosis not present

## 2019-08-26 DIAGNOSIS — Z780 Asymptomatic menopausal state: Secondary | ICD-10-CM | POA: Diagnosis not present

## 2019-08-26 DIAGNOSIS — M1990 Unspecified osteoarthritis, unspecified site: Secondary | ICD-10-CM | POA: Insufficient documentation

## 2019-08-26 DIAGNOSIS — G4733 Obstructive sleep apnea (adult) (pediatric): Secondary | ICD-10-CM | POA: Diagnosis not present

## 2019-08-26 LAB — CBC WITH DIFFERENTIAL/PLATELET
Abs Immature Granulocytes: 0.02 10*3/uL (ref 0.00–0.07)
Basophils Absolute: 0 10*3/uL (ref 0.0–0.1)
Basophils Relative: 1 %
Eosinophils Absolute: 0.1 10*3/uL (ref 0.0–0.5)
Eosinophils Relative: 2 %
HCT: 42 % (ref 36.0–46.0)
Hemoglobin: 13.8 g/dL (ref 12.0–15.0)
Immature Granulocytes: 0 %
Lymphocytes Relative: 35 %
Lymphs Abs: 2.3 10*3/uL (ref 0.7–4.0)
MCH: 32.2 pg (ref 26.0–34.0)
MCHC: 32.9 g/dL (ref 30.0–36.0)
MCV: 98.1 fL (ref 80.0–100.0)
Monocytes Absolute: 0.6 10*3/uL (ref 0.1–1.0)
Monocytes Relative: 9 %
Neutro Abs: 3.5 10*3/uL (ref 1.7–7.7)
Neutrophils Relative %: 53 %
Platelets: 223 10*3/uL (ref 150–400)
RBC: 4.28 MIL/uL (ref 3.87–5.11)
RDW: 11.7 % (ref 11.5–15.5)
WBC: 6.6 10*3/uL (ref 4.0–10.5)
nRBC: 0 % (ref 0.0–0.2)

## 2019-08-26 LAB — COMPREHENSIVE METABOLIC PANEL
ALT: 37 U/L (ref 0–44)
AST: 30 U/L (ref 15–41)
Albumin: 4.2 g/dL (ref 3.5–5.0)
Alkaline Phosphatase: 55 U/L (ref 38–126)
Anion gap: 8 (ref 5–15)
BUN: 18 mg/dL (ref 8–23)
CO2: 29 mmol/L (ref 22–32)
Calcium: 9.7 mg/dL (ref 8.9–10.3)
Chloride: 104 mmol/L (ref 98–111)
Creatinine, Ser: 0.61 mg/dL (ref 0.44–1.00)
GFR calc Af Amer: >60 mL/min (ref >60)
GFR calc non Af Amer: >60 mL/min (ref >60)
Glucose, Bld: 106 mg/dL — ABNORMAL HIGH (ref 70–99)
Potassium: 4.5 mmol/L (ref 3.5–5.1)
Sodium: 141 mmol/L (ref 135–145)
Total Bilirubin: 0.8 mg/dL (ref 0.3–1.2)
Total Protein: 6.8 g/dL (ref 6.5–8.1)

## 2019-08-27 ENCOUNTER — Ambulatory Visit (HOSPITAL_COMMUNITY): Payer: Medicare Other | Admitting: Hematology

## 2019-08-28 ENCOUNTER — Ambulatory Visit (HOSPITAL_COMMUNITY): Payer: Medicare Other | Admitting: Hematology

## 2019-09-01 ENCOUNTER — Other Ambulatory Visit: Payer: Self-pay

## 2019-09-02 ENCOUNTER — Encounter (HOSPITAL_COMMUNITY): Payer: Self-pay | Admitting: Hematology

## 2019-09-02 ENCOUNTER — Inpatient Hospital Stay (HOSPITAL_COMMUNITY): Payer: Medicare Other | Admitting: Hematology

## 2019-09-02 ENCOUNTER — Inpatient Hospital Stay (HOSPITAL_COMMUNITY): Payer: Medicare Other

## 2019-09-02 VITALS — BP 130/69 | HR 59 | Temp 97.2°F | Resp 18 | Wt 169.7 lb

## 2019-09-02 DIAGNOSIS — Z853 Personal history of malignant neoplasm of breast: Secondary | ICD-10-CM

## 2019-09-02 DIAGNOSIS — M8580 Other specified disorders of bone density and structure, unspecified site: Secondary | ICD-10-CM

## 2019-09-02 MED ORDER — DENOSUMAB 60 MG/ML ~~LOC~~ SOSY
60.0000 mg | PREFILLED_SYRINGE | Freq: Once | SUBCUTANEOUS | Status: AC
  Administered 2019-09-02: 60 mg via SUBCUTANEOUS
  Filled 2019-09-02: qty 1

## 2019-09-02 NOTE — Progress Notes (Signed)
Prolia injection given today per orders. See MAR for details.

## 2019-09-02 NOTE — Progress Notes (Signed)
[address], [place] [address]   CLINIC:  Medical Oncology/Hematology  PCP:  Marin Olp, [place] [id number] [phone]   REASON FOR VISIT:  Follow-up for breast cancer  CURRENT THERAPY: Clinical Surveillance     INTERVAL HISTORY:  Angela [name] 73 y.o. female presents today for follow up. Reports overall doing well.  Denies any significant fatigue.  Denies any changes in her breasts.  No new lumps, masses, nipple inversion or discharge.  Denies any change in bowel habits.  Appetite is stable.  No weight loss.  She is here to discuss most recent labs, DEXA scan, and mammogram.   REVIEW OF SYSTEMS:  Review of Systems  Constitutional: Negative.   HENT:  Negative.   Eyes: Negative.   Respiratory: Negative.   Cardiovascular: Negative.   Gastrointestinal: Negative.   Endocrine: Negative.   Genitourinary: Negative.    Musculoskeletal: Positive for arthralgias.  Skin: Negative.   Neurological: Negative.   Hematological: Negative.   Psychiatric/Behavioral: Negative.      PAST MEDICAL/SURGICAL HISTORY:  Past Medical History:  Diagnosis Date  . Arthritis   . BCC (basal cell carcinoma)   . Breast cancer ([place]) 2009   Left Breast Cancer  . Cataract   . Hyperlipidemia   . Hypertension   . Macular retinal cyst of right eye   . OSA (obstructive sleep apnea)    CPAP  . Osteopenia   . Personal history of radiation therapy    Past Surgical History:  Procedure Laterality Date  . BREAST EXCISIONAL BIOPSY Right   . BREAST LUMPECTOMY Left 06/2008  . CATARACT EXTRACTION  2012  . COLONOSCOPY    . Eyelid surgery Bilateral   . INTRAVASCULAR PRESSURE WIRE/FFR STUDY N/A 06/01/2019   Procedure: INTRAVASCULAR PRESSURE WIRE/FFR STUDY;  Surgeon: Belva Crome, MD;  Location: [facility];  Service: Cardiovascular;  Laterality: N/A;  . LEFT HEART CATH AND CORONARY ANGIOGRAPHY N/A 06/01/2019   Procedure: LEFT  HEART CATH AND CORONARY ANGIOGRAPHY;  Surgeon: Belva Crome, MD;  Location: [facility];  Service: Cardiovascular;  Laterality: N/A;  . MASS EXCISION  03/2008   Appendix  . SKIN CANCER EXCISION    . TONSILLECTOMY       SOCIAL HISTORY:  Social History   Socioeconomic History  . Marital status: Married    Spouse name: Not on file  . Number of children: 3  . Years of education: Not on file  . Highest education level: Not on file  Occupational History  . Occupation: retired    Fish farm manager: RETIRED    Comment: works 1 day a week  Tobacco Use  . Smoking status: Never Smoker  . Smokeless tobacco: Never Used  Substance and Sexual Activity  . Alcohol use: Yes    Alcohol/week: 3.0 standard drinks    Types: 3 Glasses of wine per week    Comment: one glass a week  . Drug use: No  . Sexual activity: Not on file  Other Topics Concern  . Not on file  Social History Narrative   Married. 2 daughters. 2 grandkids.       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 Difficulty of Paying Living Expenses: Not on file  Food Insecurity:   .  Worried About Charity fundraiser in the Last Year: Not on file  . Ran Out of Food in the Last Year: Not on file  Transportation Needs:   . Lack of Transportation (Medical): Not on file  . Lack of Transportation (Non-Medical): Not on file  Physical Activity:   . Days of Exercise per Week: Not on file  . Minutes of Exercise per Session: Not on file  Stress:   . Feeling of Stress : Not on file  Social Connections:   . Frequency of Communication with Friends and Family: Not on file  . Frequency of Social Gatherings with Friends and Family: Not on file  . Attends Religious Services: Not on file  . Active Member of Clubs or Organizations: Not on file  . Attends Archivist Meetings: Not on  file  . Marital Status: Not on file  Intimate Partner Violence:   . Fear of Current or Ex-Partner: Not on file  . Emotionally Abused: Not on file  . Physically Abused: Not on file  . Sexually Abused: Not on file    FAMILY HISTORY:  Family History  Problem Relation Age of Onset  . Heart attack Mother   . Hypertension Mother   . Heart disease Father   . Heart attack Father   . Hypertension Father   . Colon cancer Neg Hx   . Stroke Neg Hx   . Esophageal cancer Neg Hx   . Rectal cancer Neg Hx   . Stomach cancer Neg Hx     CURRENT MEDICATIONS:  Outpatient Encounter Medications as of 09/02/2019  Medication Sig  . aspirin 81 MG tablet Take 81 mg by mouth every evening.   . Calcium Carbonate-Vitamin D (CALTRATE 600+D) 600-400 MG-UNIT per tablet Take 2 tablets by mouth every evening.   . Cholecalciferol (VITAMIN D3) 50 MCG (2000 UT) capsule Take 2,000 Units by mouth every evening.   . Coenzyme Q10 (COQ-10) 100 MG CAPS Take 100 mg by mouth daily.   Marland Kitchen denosumab (PROLIA) 60 MG/ML SOLN injection Inject 60 mg into the skin every 6 (six) months.   . fish oil-omega-3 fatty acids 1000 MG capsule Take 1 g by mouth every evening.   . hydrochlorothiazide (HYDRODIURIL) 25 MG tablet TAKE 1 TABLET BY MOUTH EVERY DAY  . isosorbide mononitrate (IMDUR) 30 MG 24 hr tablet Take 1 tablet (30 mg total) by mouth 2 (two) times daily.  . metoprolol succinate (TOPROL-XL) 50 MG 24 hr tablet Take 1 tablet (50 mg total) by mouth every evening.  . Multiple Vitamin (MULTIVITAMIN) tablet Take 1 tablet by mouth every evening.   . potassium chloride SA (KLOR-CON) 20 MEQ tablet TAKE 1 TABLET BY MOUTH EVERY DAY  . rosuvastatin (CRESTOR) 20 MG tablet Take 1 tablet (20 mg total) by mouth daily.  . vitamin C (ASCORBIC ACID) 500 MG tablet Take 500 mg by mouth every evening.   Marland Kitchen ZINC-A-COLD MT Use as directed in the mouth or throat. Cold Ease  . acetaminophen (TYLENOL) 500 MG tablet Take 500 mg by mouth every 6 (six) hours  as needed for moderate pain or headache.  . nitroGLYCERIN (NITROSTAT) 0.4 MG SL tablet Place 1 tablet (0.4 mg total) under the tongue every 5 (five) minutes as needed for chest pain. (Patient not taking: Reported on 09/02/2019)   No facility-administered encounter medications on file as of 09/02/2019.    ALLERGIES:  Allergies  Allergen Reactions  . Lisinopril Cough  . Statins Other (See Comments)  Myalgia with lipitor- unproven She is able to tolerate simvastatin     PHYSICAL EXAM:  ECOG Performance status: 1  Vitals:   09/02/19 1043  BP: 130/69  Pulse: (!) 59  Resp: 18  Temp: (!) 97.2 F (36.2 C)  SpO2: 97%   Filed Weights   09/02/19 1043  Weight: 169 lb 11.2 oz (77 kg)    Physical Exam Constitutional:      Appearance: Normal appearance.  HENT:     Head: Normocephalic.     Right Ear: External ear normal.     Left Ear: External ear normal.     Nose: Nose normal.  Eyes:     Conjunctiva/sclera: Conjunctivae normal.  Cardiovascular:     Rate and Rhythm: Normal rate and regular rhythm.     Pulses: Normal pulses.     Heart sounds: Normal heart sounds.  Pulmonary:     Effort: Pulmonary effort is normal.     Breath sounds: Normal breath sounds.  Abdominal:     General: Bowel sounds are normal.  Genitourinary:    General: Normal vulva.  Musculoskeletal:        General: Normal range of motion.     Cervical back: Normal range of motion.  Skin:    General: Skin is warm.  Neurological:     General: No focal deficit present.     Mental Status: She is alert and oriented to person, place, and time.  Psychiatric:        Mood and Affect: Mood normal.        Behavior: Behavior normal.      LABORATORY DATA:  I have reviewed the labs as listed.  CBC    Component Value Date/Time   WBC 6.6 08/26/2019 1051   RBC 4.28 08/26/2019 1051   HGB 13.8 08/26/2019 1051   HCT 42.0 08/26/2019 1051   PLT 223 08/26/2019 1051   MCV 98.1 08/26/2019 1051   MCH 32.2  08/26/2019 1051   MCHC 32.9 08/26/2019 1051   RDW 11.7 08/26/2019 1051   LYMPHSABS 2.3 08/26/2019 1051   MONOABS 0.6 08/26/2019 1051   EOSABS 0.1 08/26/2019 1051   BASOSABS 0.0 08/26/2019 1051   CMP Latest Ref Rng & Units 08/26/2019 07/24/2019 05/29/2019  Glucose 70 - 99 mg/dL 106(H) 95 87  BUN 8 - 23 mg/dL 18 12 16   Creatinine 0.44 - 1.00 mg/dL 0.61 0.70 0.65  Sodium 135 - 145 mmol/L 141 142 137  Potassium 3.5 - 5.1 mmol/L 4.5 4.5 4.0  Chloride 98 - 111 mmol/L 104 104 102  CO2 22 - 32 mmol/L 29 32 28  Calcium 8.9 - 10.3 mg/dL 9.7 10.0 9.6  Total Protein 6.5 - 8.1 g/dL 6.8 6.9 -  Total Bilirubin 0.3 - 1.2 mg/dL 0.8 0.5 -  Alkaline Phos 38 - 126 U/L 55 55 -  AST 15 - 41 U/L 30 27 -  ALT 0 - 44 U/L 37 27 -        ASSESSMENT & PLAN:   History of breast cancer 1.  Stage I left breast cancer: -Diagnosed in 2009, status post lumpectomy and radiation therapy -Tamoxifen followed by Arimidex completed in December 2014 -Today's physical examination did not reveal any abnormalities in the breast.  Left breast upper outer quadrant scar from lumpectomy is stable. -Most recent mammogram from July 29, 2019 was negative for any evidence of malignancy or recurrent disease.  BI-RADS Category 1.  Recommendation is for her to repeat mammogram in 1 year. -Labs  were within normal limits.  2.  Osteopenia: - DEXA scan on 07/01/2017 shows T score of -1.6. -She is started on Prolia on 01/04/2014.  Last dose was 08/29/2018 and she is tolerating it well. - She will continue calcium and vitamin D supplements. -DEXA scan on 08/26/2019 suggested a T score of -1.6.      Orders placed this encounter:  Orders Placed This Encounter  Procedures  . MM Digital Screening      [place] [phone]

## 2019-09-02 NOTE — Assessment & Plan Note (Addendum)
1.  Stage I left breast cancer: -Diagnosed in 2009, status post lumpectomy and radiation therapy -Tamoxifen followed by Arimidex completed in December 2014 -Today's physical examination did not reveal any abnormalities in the breast.  Left breast upper outer quadrant scar from lumpectomy is stable. -Most recent mammogram from July 29, 2019 was negative for any evidence of malignancy or recurrent disease.  BI-RADS Category 1.  Recommendation is for her to repeat mammogram in 1 year. -Labs were within normal limits.  2.  Osteopenia: - DEXA scan on 07/01/2017 shows T score of -1.6. -She is started on Prolia on 01/04/2014.  Last dose was 08/29/2018 and she is tolerating it well. - She will continue calcium and vitamin D supplements. -DEXA scan on 08/26/2019 suggested a T score of -1.6.

## 2019-09-09 ENCOUNTER — Encounter: Payer: Self-pay | Admitting: Family Medicine

## 2019-09-09 ENCOUNTER — Other Ambulatory Visit: Payer: Self-pay | Admitting: Family Medicine

## 2019-09-09 DIAGNOSIS — Z1231 Encounter for screening mammogram for malignant neoplasm of breast: Secondary | ICD-10-CM

## 2019-09-10 ENCOUNTER — Other Ambulatory Visit: Payer: Self-pay

## 2019-09-10 MED ORDER — NITROGLYCERIN 0.4 MG SL SUBL: 0.4000 mg | SUBLINGUAL_TABLET | SUBLINGUAL | 6 refills | Status: DC | PRN

## 2019-09-10 NOTE — Telephone Encounter (Signed)
Refilled NTG 

## 2019-09-18 ENCOUNTER — Ambulatory Visit: Payer: Medicare Other | Admitting: Podiatry

## 2019-10-07 ENCOUNTER — Ambulatory Visit: Payer: Medicare Other

## 2019-10-16 ENCOUNTER — Ambulatory Visit: Payer: Medicare Other

## 2019-10-26 ENCOUNTER — Other Ambulatory Visit: Payer: Self-pay | Admitting: *Deleted

## 2019-10-26 ENCOUNTER — Telehealth: Payer: Self-pay

## 2019-10-26 DIAGNOSIS — I25119 Atherosclerotic heart disease of native coronary artery with unspecified angina pectoris: Secondary | ICD-10-CM

## 2019-10-26 DIAGNOSIS — E782 Mixed hyperlipidemia: Secondary | ICD-10-CM

## 2019-10-26 NOTE — Telephone Encounter (Signed)
10-26-19/Rec'd call from Pt that Lab does not have orders. Please call. Pt would like to have labs this week before appt with Dr.McDowell on 11-06-19.   [phone]  Thanks renee

## 2019-10-26 NOTE — Telephone Encounter (Signed)
Responded to patient via mychart

## 2019-10-26 NOTE — Telephone Encounter (Signed)
Will forward to [place] office.  

## 2019-10-27 ENCOUNTER — Other Ambulatory Visit: Payer: Self-pay

## 2019-10-27 ENCOUNTER — Other Ambulatory Visit (HOSPITAL_COMMUNITY)
Admission: RE | Admit: 2019-10-27 | Discharge: 2019-10-27 | Disposition: A | Discharge status: Home / Self Care | Payer: Medicare PPO | Source: Ambulatory Visit | Attending: Cardiology | Admitting: Cardiology

## 2019-10-27 DIAGNOSIS — E782 Mixed hyperlipidemia: Secondary | ICD-10-CM | POA: Diagnosis not present

## 2019-10-27 LAB — LIPID PANEL
Cholesterol: 138 mg/dL (ref 0–200)
HDL: 33 mg/dL — ABNORMAL LOW (ref >40)
LDL Cholesterol: 81 mg/dL (ref 0–99)
Total CHOL/HDL Ratio: 4.2 RATIO
Triglycerides: 122 mg/dL (ref <150)
VLDL: 24 mg/dL (ref 0–40)

## 2019-10-27 LAB — HEPATIC FUNCTION PANEL
ALT: 45 U/L — ABNORMAL HIGH (ref 0–44)
AST: 33 U/L (ref 15–41)
Albumin: 4.5 g/dL (ref 3.5–5.0)
Alkaline Phosphatase: 52 U/L (ref 38–126)
Bilirubin, Direct: 0.1 mg/dL (ref 0.0–0.2)
Indirect Bilirubin: 0.8 mg/dL (ref 0.3–0.9)
Total Bilirubin: 0.9 mg/dL (ref 0.3–1.2)
Total Protein: 7.2 g/dL (ref 6.5–8.1)

## 2019-10-28 ENCOUNTER — Ambulatory Visit: Payer: Medicare Other

## 2019-10-28 ENCOUNTER — Telehealth: Payer: Self-pay | Admitting: *Deleted

## 2019-10-28 NOTE — Telephone Encounter (Signed)
Patient informed. Copy sent to PCP °

## 2019-10-28 NOTE — Telephone Encounter (Signed)
-----   Message from Satira Sark, MD sent at 10/27/2019 10:04 AM EST ----- Results reviewed.  Total cholesterol down to 138, LDL relatively stable at 81.  Continue with current plan.

## 2019-11-01 ENCOUNTER — Other Ambulatory Visit: Payer: Self-pay | Admitting: Family Medicine

## 2019-11-06 ENCOUNTER — Ambulatory Visit: Payer: Medicare Other

## 2019-11-06 ENCOUNTER — Ambulatory Visit: Payer: Medicare Other | Admitting: Cardiology

## 2019-11-10 ENCOUNTER — Other Ambulatory Visit: Payer: Self-pay

## 2019-11-10 ENCOUNTER — Ambulatory Visit (INDEPENDENT_AMBULATORY_CARE_PROVIDER_SITE_OTHER): Payer: Medicare PPO | Admitting: Podiatry

## 2019-11-10 ENCOUNTER — Encounter: Payer: Self-pay | Admitting: Podiatry

## 2019-11-10 VITALS — Temp 98.1°F

## 2019-11-10 DIAGNOSIS — B351 Tinea unguium: Secondary | ICD-10-CM | POA: Diagnosis not present

## 2019-11-10 DIAGNOSIS — M79674 Pain in right toe(s): Secondary | ICD-10-CM

## 2019-11-10 DIAGNOSIS — G629 Polyneuropathy, unspecified: Secondary | ICD-10-CM | POA: Insufficient documentation

## 2019-11-10 DIAGNOSIS — R200 Anesthesia of skin: Secondary | ICD-10-CM | POA: Insufficient documentation

## 2019-11-10 DIAGNOSIS — M79675 Pain in left toe(s): Secondary | ICD-10-CM | POA: Diagnosis not present

## 2019-11-10 DIAGNOSIS — R202 Paresthesia of skin: Secondary | ICD-10-CM | POA: Diagnosis not present

## 2019-11-10 NOTE — Progress Notes (Signed)
Complaint:  Visit Type: Patient returns to my office for continued preventative foot care services. Complaint: Patient states" my nails in her big toes and ingrown and  painful to walk and wear shoes" . The patient presents for preventative foot care services. No changes to ROS .  Patient says she has numbness and tingling both feet.  Podiatric Exam: Vascular: dorsalis pedis and posterior tibial pulses are palpable bilateral. Capillary return is immediate. Temperature gradient is WNL. Skin turgor WNL  Sensorium: Normal Semmes Weinstein monofilament test. Normal tactile sensation bilaterally. Nail Exam: Pt has thick disfigured discolored nails with subungual debris and pincer toenail right hallux. Ulcer Exam: There is no evidence of ulcer or pre-ulcerative changes or infection. Orthopedic Exam: Muscle tone and strength are WNL. No limitations in general ROM. No crepitus or effusions noted.. Bony prominences are unremarkable. Skin: No Porokeratosis. No infection or ulcers  Diagnosis:  Onychomycosis, , Pain in right toe, pain in left toes,  Neuropathy.  Treatment & Plan Procedures and Treatment: Consent by patient was obtained for treatment procedures.   Debridement of mycotic and hypertrophic toenails, 1 through 5 bilateral and clearing of subungual debris. No ulceration, no infection noted. Patient was told to acquire spenco  3/4 orthoses to be worn in her shoes to help her numbness and tingling. Return Visit-Office Procedure: Patient instructed to return to the office for a follow up visit 3 months for continued evaluation and treatment.    Gardiner Barefoot DPM

## 2019-11-12 ENCOUNTER — Encounter: Payer: Self-pay | Admitting: Family Medicine

## 2019-11-24 NOTE — Telephone Encounter (Signed)
Virtual visit should be fine.

## 2019-12-02 ENCOUNTER — Encounter: Payer: Self-pay | Admitting: Family Medicine

## 2019-12-04 ENCOUNTER — Encounter: Payer: Self-pay | Admitting: Cardiology

## 2019-12-04 ENCOUNTER — Telehealth (INDEPENDENT_AMBULATORY_CARE_PROVIDER_SITE_OTHER): Payer: Medicare PPO | Admitting: Cardiology

## 2019-12-04 VITALS — BP 127/81 | HR 65 | Ht 66.0 in | Wt 169.0 lb

## 2019-12-04 DIAGNOSIS — I25119 Atherosclerotic heart disease of native coronary artery with unspecified angina pectoris: Secondary | ICD-10-CM | POA: Diagnosis not present

## 2019-12-04 DIAGNOSIS — E782 Mixed hyperlipidemia: Secondary | ICD-10-CM

## 2019-12-04 DIAGNOSIS — Z79899 Other long term (current) drug therapy: Secondary | ICD-10-CM

## 2019-12-04 MED ORDER — ROSUVASTATIN CALCIUM 40 MG PO TABS: 40.0000 mg | ORAL_TABLET | Freq: Every day | ORAL | 3 refills | Status: DC

## 2019-12-04 NOTE — Progress Notes (Signed)
Virtual Visit via Video Note   This visit type was conducted due to national recommendations for restrictions regarding the COVID-19 Pandemic (e.g. social distancing) in an effort to limit this patient's exposure and mitigate transmission in our community.  Due to her co-morbid illnesses, this patient is at least at moderate risk for complications without adequate follow up.  This format is felt to be most appropriate for this patient at this time.  All issues noted in this document were discussed and addressed.  A limited physical exam was performed with this format.  Please refer to the patient's chart for her consent to telehealth for [facility].   The patient was identified using 2 identifiers.  Date:  12/04/2019   ID:  Angela [name], DOB 1945-10-06, MRN [id number]  Patient Location: Home Provider Location: Office  PCP:  Marin Olp, MD  Cardiologist:  Rozann Lesches, MD Electrophysiologist:  None   Evaluation Performed:  Follow-Up Visit  Chief Complaint:  Cardiac follow-up  History of Present Illness:    Angela [name] is a 74 y.o. female last assessed via telehealth encounter in October 2020.  We communicated by video conferencing today.  She states that she has been doing well in terms of angina symptoms on medical therapy.  She walks a mile a day.  No increasing nitroglycerin use.  Follow-up LDL in February was 81, AST 33, and ALT 45.  We discussed this today.  Plan will be to increase Crestor to 40 mg daily if tolerated.  Alternatively, we could put her on Zetia as well which would probably get her down to goal of LDL below 70 if need be.  I reviewed her cardiac medications which are stable and outlined below.  The patient does not have symptoms concerning for COVID-19 infection (fever, chills, cough, or new shortness of breath).  She states that she has had both vaccine doses.   Past Medical History:  Diagnosis Date  . Arthritis   . BCC (basal cell carcinoma)    . Breast cancer ([place]) 2009   Left Breast Cancer  . CAD (coronary artery disease)   . Cataract   . Essential hypertension   . Hyperlipidemia   . Macular retinal cyst of right eye   . OSA (obstructive sleep apnea)    CPAP  . Osteopenia   . Personal history of radiation therapy    Past Surgical History:  Procedure Laterality Date  . BREAST EXCISIONAL BIOPSY Right   . BREAST LUMPECTOMY Left 06/2008  . CATARACT EXTRACTION  2012  . COLONOSCOPY    . Eyelid surgery Bilateral   . INTRAVASCULAR PRESSURE WIRE/FFR STUDY N/A 06/01/2019   Procedure: INTRAVASCULAR PRESSURE WIRE/FFR STUDY;  Surgeon: Belva Crome, MD;  Location: [facility];  Service: Cardiovascular;  Laterality: N/A;  . LEFT HEART CATH AND CORONARY ANGIOGRAPHY N/A 06/01/2019   Procedure: LEFT HEART CATH AND CORONARY ANGIOGRAPHY;  Surgeon: Belva Crome, MD;  Location: [facility];  Service: Cardiovascular;  Laterality: N/A;  . MASS EXCISION  03/2008   Appendix  . SKIN CANCER EXCISION    . TONSILLECTOMY       Current Meds  Medication Sig  . acetaminophen (TYLENOL) 500 MG tablet Take 500 mg by mouth every 6 (six) hours as needed for moderate pain or headache.  Marland Kitchen aspirin 81 MG tablet Take 81 mg by mouth every evening.   . Calcium Carbonate-Vitamin D (CALTRATE 600+D) 600-400 MG-UNIT per tablet Take 2 tablets by  mouth every evening.   . Cholecalciferol (VITAMIN D3) 50 MCG (2000 UT) capsule Take 2,000 Units by mouth every evening.   . Coenzyme Q10 (COQ-10) 100 MG CAPS Take 100 mg by mouth daily.   Marland Kitchen denosumab (PROLIA) 60 MG/ML SOLN injection Inject 60 mg into the skin every 6 (six) months.   . fish oil-omega-3 fatty acids 1000 MG capsule Take 1 g by mouth every evening.   . hydrochlorothiazide (HYDRODIURIL) 25 MG tablet TAKE 1 TABLET BY MOUTH EVERY DAY  . isosorbide mononitrate (IMDUR) 30 MG 24 hr tablet Take 1 tablet (30 mg total) by mouth 2 (two) times daily.  . metoprolol succinate (TOPROL-XL) 50 MG 24 hr tablet  TAKE 1 TABLET(50 MG) BY MOUTH EVERY EVENING  . Multiple Vitamin (MULTIVITAMIN) tablet Take 1 tablet by mouth every evening.   . nitroGLYCERIN (NITROSTAT) 0.4 MG SL tablet Place 1 tablet (0.4 mg total) under the tongue every 5 (five) minutes as needed for chest pain.  . potassium chloride SA (KLOR-CON) 20 MEQ tablet TAKE 1 TABLET BY MOUTH EVERY DAY  . vitamin C (ASCORBIC ACID) 500 MG tablet Take 500 mg by mouth every evening.   Marland Kitchen ZINC-A-COLD MT Use as directed in the mouth or throat. Cold Ease  . [DISCONTINUED] rosuvastatin (CRESTOR) 20 MG tablet Take 1 tablet (20 mg total) by mouth daily.     Allergies:   Lisinopril and Statins   ROS:   No dizziness or syncope.  Prior CV studies:   The following studies were reviewed today:  Cardiac catheterization 06/01/2019:  Right dominant coronary anatomy.  Moderate to heavy LAD and circumflex calcification.  Widely patent left main.  The proximal to mid LAD is heavily calcified and contains segmental 50% tubular narrowing over approximately 20 mm. The mid LAD contains relatively focal calcified 65 to 75% stenosis. The LAD beyond this segment is widely patent. The first diagonal contains 60% ostial narrowing and arises from the calcified tubular stenosis.  Both DFR (0.9) and FFR (0.82)  Circumflex coronary artery contains no significant obstruction. The second obtuse marginal contains 50 to 60% ostial narrowing.  RCA is dominant with proximal 30 to 40% narrowing and mid 25% narrowing.  Normal left ventricular size and function.  Recommendations:   Aggressive risk factor modification including consideration of PCSK9 if unable to tolerate statins and the addition of VASCEPA.  If we entertain intervention on the LAD despite absence of hemodynamic data for significant stenosis, we need to be performed from the right femoral for better guide catheter support. Would likely also perform atherectomy to ensure stent expansion.  Labs/Other  Tests and Data Reviewed:    EKG:  An ECG dated 06/01/2019 was personally reviewed today and demonstrated:  Sinus bradycardia with increased voltage.  Recent Labs: 08/26/2019: BUN 18; Creatinine, Ser 0.61; Hemoglobin 13.8; Platelets 223; Potassium 4.5; Sodium 141 10/27/2019: ALT 45   Recent Lipid Panel Lab Results  Component Value Date/Time   CHOL 138 10/27/2019 09:07 AM   CHOL 169 01/14/2017 08:35 AM   TRIG 122 10/27/2019 09:07 AM   HDL 33 (L) 10/27/2019 09:07 AM   HDL 34 (L) 01/14/2017 08:35 AM   CHOLHDL 4.2 10/27/2019 09:07 AM   LDLCALC 81 10/27/2019 09:07 AM   LDLCALC 90 01/14/2017 08:35 AM   LDLDIRECT 170.3 07/09/2013 08:13 AM    Wt Readings from Last 3 Encounters:  12/04/19 169 lb (76.7 kg)  09/02/19 169 lb 11.2 oz (77 kg)  07/24/19 168 lb 9.6 oz (76.5 kg)  Objective:    Vital Signs:  BP 127/81   Pulse 65   Ht 5\' 6"  (1.676 m)   Wt 169 lb (76.7 kg)   LMP  (LMP Unknown)   BMI 27.28 kg/m    General: Patient appeared comfortable at rest. HEENT: Conjunctiva and lids normal. Skin: Normal appearance of color and turgor. Lungs: Patient spoke in full sentences, not short of breath, no wheezing or coughing observed. Musculoskeletal: No kyphosis evident. Neuropsychiatric: Gaze conjugate, speech pattern normal.  Patient observed to ambulate.  ASSESSMENT & PLAN:    1.  CAD with moderate, calcified mid LAD stenosis that is being managed medically at this point based on work-up noted above.  She is doing well without progressive angina.  Continue walking plan.  Medications include aspirin, Imdur, Toprol-XL, Crestor, and as needed nitroglycerin.  2.  Mixed hyperlipidemia, recent LDL 81.  Attempt increase in Crestor to 40 mg daily.  If tolerated will check FLP and LFT in 8 to 12 weeks.   Time:   Today, I have spent 12 minutes with the patient with telehealth technology discussing the above problems.     Medication Adjustments/Labs and Tests Ordered: Current medicines are  reviewed at length with the patient today.  Concerns regarding medicines are outlined above.   Tests Ordered: Orders Placed This Encounter  Procedures  . Lipid panel  . Hepatic function panel    Medication Changes: Meds ordered this encounter  Medications  . rosuvastatin (CRESTOR) 40 MG tablet    Sig: Take 1 tablet (40 mg total) by mouth daily.    Dispense:  90 tablet    Refill:  3    12/04/2019 dose increase    Follow Up:  In Person 6 months in the [place] office.  Signed, Rozann Lesches, MD  12/04/2019 8:58 AM    [place]

## 2019-12-04 NOTE — Patient Instructions (Addendum)
Medication Instructions:   Your physician has recommended you make the following change in your medication:   Increase rosuvastatin to 40 mg by mouth daily. You may take (2) of your 20 mg tablets daily until they are finished  Continue other medications the same  Labwork: Your physician recommends that you return for a FASTING lipid/liver profile in 8-12 weeks. Please do not eat or drink for at least 8 hours when you have this done. You may take your medications that morning with a sip of water. You may have this done at [facility] in June 2021 Monday through Friday from 8:00 am - 4:00 pm. No appointment is needed.  Testing/Procedures:  NONE  Follow-Up:  Your physician recommends that you schedule a follow-up appointment in: 6 months [facility] office). You will receive a reminder letter in the mail in about 4 months reminding you to call and schedule your appointment. If you don't receive this letter, please contact our office.  Any Other Special Instructions Will Be Listed Below (If Applicable).  If you need a refill on your cardiac medications before your next appointment, please call your pharmacy.

## 2020-01-21 ENCOUNTER — Ambulatory Visit: Payer: Medicare Other | Admitting: Family Medicine

## 2020-01-21 ENCOUNTER — Encounter: Payer: Self-pay | Admitting: Family Medicine

## 2020-01-25 NOTE — Progress Notes (Signed)
Phone [phone] In person visit   Subjective:   Angela [name] is a 74 y.o. year old very pleasant female patient who presents for/with See problem oriented charting Chief Complaint  Patient presents with  . Hypertension    6 month follow up  . Hyperlipidemia  . Hyperglycemia  . Chest Pain    Pt says that she still has mild chest discomfort that may be GERD.     This visit occurred during the SARS-CoV-2 public health emergency.  Safety protocols were in place, including screening questions prior to the visit, additional usage of staff PPE, and extensive cleaning of exam room while observing appropriate contact time as indicated for disinfecting solutions.   Past Medical History-  Patient Active Problem List   Diagnosis Date Noted  . CAD (coronary artery disease) 07/24/2019    Priority: High  . History of breast cancer 03/20/2011    Priority: High  . OSA (obstructive sleep apnea) 08/24/2015    Priority: Medium  . Hyperglycemia 11/12/2014    Priority: Medium  . Osteopenia determined by x-ray 06/04/2014    Priority: Medium  . HTN (hypertension) 07/13/2008    Priority: Medium  . Hyperlipidemia 07/09/2007    Priority: Medium  . History of skin cancer 02/21/2016    Priority: Low  . Chest discomfort 10/05/2014    Priority: Low  . 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 Vitamin D deficiency 07/09/2007    Priority: Low  . Numbness and tingling of both feet 11/10/2019  . Neuropathy 11/10/2019  . History of colon polyps 06/12/2018    Medications- reviewed and updated Current Outpatient Medications  Medication Sig Dispense Refill  . acetaminophen (TYLENOL) 500 MG tablet Take 500 mg by mouth every 6 (six) hours as needed for moderate pain or headache.    Marland Kitchen aspirin 81 MG tablet Take 81 mg by mouth every evening.     . Calcium Carbonate-Vitamin D (CALTRATE 600+D) 600-400 MG-UNIT per tablet Take 2 tablets by mouth every evening.     . Cholecalciferol (VITAMIN  D3) 50 MCG (2000 UT) capsule Take 2,000 Units by mouth every evening.     . Coenzyme Q10 (COQ-10) 100 MG CAPS Take 100 mg by mouth daily.     Marland Kitchen denosumab (PROLIA) 60 MG/ML SOLN injection Inject 60 mg into the skin every 6 (six) months.     . fish oil-omega-3 fatty acids 1000 MG capsule Take 1 g by mouth every evening.     . hydrochlorothiazide (HYDRODIURIL) 25 MG tablet TAKE 1 TABLET BY MOUTH EVERY DAY 90 tablet 1  . isosorbide mononitrate (IMDUR) 30 MG 24 hr tablet Take 1 tablet (30 mg total) by mouth 2 (two) times daily. 180 tablet 2  . metoprolol succinate (TOPROL-XL) 50 MG 24 hr tablet TAKE 1 TABLET(50 MG) BY MOUTH EVERY EVENING 30 tablet 3  . Multiple Vitamin (MULTIVITAMIN) tablet Take 1 tablet by mouth every evening.     . nitroGLYCERIN (NITROSTAT) 0.4 MG SL tablet Place 1 tablet (0.4 mg total) under the tongue every 5 (five) minutes as needed for chest pain. 25 tablet 6  . potassium chloride SA (KLOR-CON) 20 MEQ tablet TAKE 1 TABLET BY MOUTH EVERY DAY 90 tablet 1  . rosuvastatin (CRESTOR) 40 MG tablet Take 1 tablet (40 mg total) by mouth daily. 90 tablet 3  . vitamin C (ASCORBIC ACID) 500 MG tablet Take 500 mg by mouth every evening.     Marland Kitchen ZINC-A-COLD MT Use as directed  in the mouth or throat. Cold Ease     No current facility-administered medications for this visit.     Objective:  BP 122/70   Pulse (!) 54   Temp 98.2 F (36.8 C) (Temporal)   Ht 5\' 6"  (1.676 m)   Wt 166 lb 6.4 oz (75.5 kg)   LMP  (LMP Unknown)   SpO2 96%   BMI 26.86 kg/m  Gen: NAD, resting comfortably CV: RRR no murmurs rubs or gallops Lungs: CTAB no crackles, wheeze, rhonchi Abdomen: soft/nontender/nondistended/normal bowel sounds. Ext: no edema Skin: warm, dry    Assessment and Plan   #Coronary artery disease of native artery of native heart with stable angina pectoris (HCC) #hyperlipidemia S: Medication: aspirin 81 mg, imdur 30mg , nitroglycerin prn, rosuvastatin 40 mg  Patient does report chest  pain on Sunday after eating BBQ- mild discomfort in right side of chest. She took an extra imdur and later took Tums- tums helped more than imdur. No other chest pain or sob outside of that- walking mile each day and doing well. Has not had to use nitroglycerin.   A/P: For CAD-last saw cardiology December 04, 2019- I think her chest pain on Sunday was related to reflux given improvement with Tums-if she has ongoing issues she will let us know.  Continue Imdur 30 mg, rosuvastatin, aspirin for now.  LDL above goal 10/27/2019 at 81 and increased to max dose statin rosuvastatin 40 mg as above.  Mild ALT elevation noted at 45 so hesitant to add Zetia unless normalized LFTs-we will update lipid panel with labs today   #hypertension S: medication:  hctz 25mg  (also on potassium), imdur 30mg , metoprolol 50mg  XR Home readings #s: occasional checks at home have been good as long as rests A/P: Excellent control today-continue current medications  # Hyperglycemia/insulin resistance/prediabetes- peak a1c of 6.0 July 2019 S:  Medication: None Exercise and diet- walking a mile most days. Eating reasonably healthy diet. Down 3 lbs from last visit Lab Results  Component Value Date   HGBA1C 5.8 07/24/2019   HGBA1C 5.3 09/30/2018   HGBA1C 6.0 03/24/2018  A/P: hopefully controlled- update a1c today. Great job losing 3 lbs!  #Vitamin D deficiency/osteopenia- on prolia every 6 months S: Medication:  2000 units daily . prolia every 6 months started 01/04/2014- with plans to go to perhaps year 9 or early into year 74.  A/P: Last vitamin D in November looked excellent at 44-continue current medication -For osteopenia-Up-to-date on bone density with most recent DEXA being 08/26/2019 with stable results.   Recommended follow up: Return in about 6 months (around 07/28/2020) for physical or sooner if needed. Future Appointments  Date Time Provider [place]  02/16/2020  2:45 PM Gardiner Barefoot, DPM [facility]  02/23/2020 10:15 AM Rankin, Clent Demark, MD [facility] None  03/03/2020 11:10 AM [facility] LAB [facility] None  03/03/2020 12:00 PM [facility] INJ NURSE [facility] None  07/28/2020 10:00 AM [facility] MM 3 [facility]  09/05/2020 10:50 AM [facility] LAB [facility] None  09/05/2020 11:45 AM Derek Jack, MD [facility] None  09/05/2020 12:00 PM [facility] INJ NURSE AP[facility] None   Lab/Order associations:   ICD-10-CM   1. Coronary artery disease of native artery of native heart with stable angina pectoris ([place])  I25.118   2. Hyperlipidemia, unspecified hyperlipidemia type  E78.5 CBC with Differential/Platelet    Comprehensive metabolic panel    Lipid panel  3. Essential hypertension  I10   4. Hyperglycemia  R73.9 Hemoglobin A1c  5. Vitamin  D deficiency  E55.9   6. Osteopenia determined by x-ray  M85.80     No orders of the defined types were placed in this encounter.  Return precautions advised.  Garret Reddish, MD

## 2020-01-26 ENCOUNTER — Other Ambulatory Visit: Payer: Self-pay

## 2020-01-26 ENCOUNTER — Ambulatory Visit (INDEPENDENT_AMBULATORY_CARE_PROVIDER_SITE_OTHER): Payer: Medicare PPO | Admitting: Family Medicine

## 2020-01-26 ENCOUNTER — Encounter: Payer: Self-pay | Admitting: Family Medicine

## 2020-01-26 VITALS — BP 122/70 | HR 54 | Temp 98.2°F | Ht 66.0 in | Wt 166.4 lb

## 2020-01-26 DIAGNOSIS — E785 Hyperlipidemia, unspecified: Secondary | ICD-10-CM | POA: Diagnosis not present

## 2020-01-26 DIAGNOSIS — I10 Essential (primary) hypertension: Secondary | ICD-10-CM

## 2020-01-26 DIAGNOSIS — I25118 Atherosclerotic heart disease of native coronary artery with other forms of angina pectoris: Secondary | ICD-10-CM

## 2020-01-26 DIAGNOSIS — M8580 Other specified disorders of bone density and structure, unspecified site: Secondary | ICD-10-CM

## 2020-01-26 DIAGNOSIS — R739 Hyperglycemia, unspecified: Secondary | ICD-10-CM

## 2020-01-26 DIAGNOSIS — E559 Vitamin D deficiency, unspecified: Secondary | ICD-10-CM | POA: Diagnosis not present

## 2020-01-26 LAB — CBC WITH DIFFERENTIAL/PLATELET
Basophils Absolute: 0 10*3/uL (ref 0.0–0.1)
Basophils Relative: 0.3 % (ref 0.0–3.0)
Eosinophils Absolute: 0.2 10*3/uL (ref 0.0–0.7)
Eosinophils Relative: 2.7 % (ref 0.0–5.0)
HCT: 41.1 % (ref 36.0–46.0)
Hemoglobin: 14.1 g/dL (ref 12.0–15.0)
Lymphocytes Relative: 34.9 % (ref 12.0–46.0)
Lymphs Abs: 2.4 10*3/uL (ref 0.7–4.0)
MCHC: 34.3 g/dL (ref 30.0–36.0)
MCV: 95.1 fl (ref 78.0–100.0)
Monocytes Absolute: 0.7 10*3/uL (ref 0.1–1.0)
Monocytes Relative: 9.8 % (ref 3.0–12.0)
Neutro Abs: 3.5 10*3/uL (ref 1.4–7.7)
Neutrophils Relative %: 52.3 % (ref 43.0–77.0)
Platelets: 217 10*3/uL (ref 150.0–400.0)
RBC: 4.32 Mil/uL (ref 3.87–5.11)
RDW: 12.4 % (ref 11.5–15.5)
WBC: 6.8 10*3/uL (ref 4.0–10.5)

## 2020-01-26 LAB — COMPREHENSIVE METABOLIC PANEL
ALT: 25 U/L (ref 0–35)
AST: 26 U/L (ref 0–37)
Albumin: 4.6 g/dL (ref 3.5–5.2)
Alkaline Phosphatase: 55 U/L (ref 39–117)
BUN: 18 mg/dL (ref 6–23)
CO2: 33 mEq/L — ABNORMAL HIGH (ref 19–32)
Calcium: 9.7 mg/dL (ref 8.4–10.5)
Chloride: 104 mEq/L (ref 96–112)
Creatinine, Ser: 0.68 mg/dL (ref 0.40–1.20)
GFR: 84.79 mL/min (ref >60.00)
Glucose, Bld: 101 mg/dL — ABNORMAL HIGH (ref 70–99)
Potassium: 4.5 mEq/L (ref 3.5–5.1)
Sodium: 139 mEq/L (ref 135–145)
Total Bilirubin: 0.5 mg/dL (ref 0.2–1.2)
Total Protein: 6.6 g/dL (ref 6.0–8.3)

## 2020-01-26 LAB — LIPID PANEL
Cholesterol: 122 mg/dL (ref 0–200)
HDL: 33.2 mg/dL — ABNORMAL LOW (ref >39.00)
LDL Cholesterol: 70 mg/dL (ref 0–99)
NonHDL: 89.29
Total CHOL/HDL Ratio: 4
Triglycerides: 98 mg/dL (ref 0.0–149.0)
VLDL: 19.6 mg/dL (ref 0.0–40.0)

## 2020-01-26 LAB — HEMOGLOBIN A1C: Hgb A1c MFr Bld: 5.9 % (ref 4.6–6.5)

## 2020-01-26 NOTE — Patient Instructions (Addendum)
Please stop by lab before you go If you have mychart- we will send your results within 3 business days of [place] receiving them.  If you do not have mychart- we will call you about results within 5 business days of [place] receiving them.   If I do not mention sending a copy of the labs to Dr. Elenor Quinones give me a gentle reminder  No other changes today-congratulations on losing 3 pounds despite the COVID-19 pandemic!  Recommended follow up: Return in about 6 months (around 07/28/2020) for physical or sooner if needed.

## 2020-01-27 ENCOUNTER — Encounter: Payer: Self-pay | Admitting: Family Medicine

## 2020-01-28 NOTE — Telephone Encounter (Signed)
We can cancel a follow-up lipid panel since this was just obtained.  Go ahead and keep the scheduled office follow-up in June.

## 2020-02-03 ENCOUNTER — Other Ambulatory Visit: Payer: Self-pay

## 2020-02-03 MED ORDER — METOPROLOL SUCCINATE ER 50 MG PO TB24: ORAL_TABLET | ORAL | 3 refills | Status: DC

## 2020-02-12 ENCOUNTER — Ambulatory Visit: Payer: Medicare PPO | Admitting: Podiatry

## 2020-02-16 ENCOUNTER — Ambulatory Visit: Payer: Medicare PPO | Admitting: Podiatry

## 2020-02-23 ENCOUNTER — Encounter (INDEPENDENT_AMBULATORY_CARE_PROVIDER_SITE_OTHER): Payer: Medicare PPO | Admitting: Ophthalmology

## 2020-02-29 ENCOUNTER — Ambulatory Visit: Payer: Medicare PPO | Admitting: Cardiology

## 2020-03-02 ENCOUNTER — Other Ambulatory Visit (HOSPITAL_COMMUNITY): Payer: Self-pay | Admitting: *Deleted

## 2020-03-02 DIAGNOSIS — M8580 Other specified disorders of bone density and structure, unspecified site: Secondary | ICD-10-CM

## 2020-03-02 DIAGNOSIS — C50912 Malignant neoplasm of unspecified site of left female breast: Secondary | ICD-10-CM

## 2020-03-03 ENCOUNTER — Inpatient Hospital Stay (HOSPITAL_COMMUNITY): Payer: Medicare PPO | Attending: Oncology

## 2020-03-03 ENCOUNTER — Inpatient Hospital Stay (HOSPITAL_COMMUNITY): Payer: Medicare PPO

## 2020-03-03 ENCOUNTER — Other Ambulatory Visit: Payer: Self-pay

## 2020-03-03 VITALS — BP 138/71 | HR 51 | Temp 97.7°F | Resp 18

## 2020-03-03 DIAGNOSIS — C50912 Malignant neoplasm of unspecified site of left female breast: Secondary | ICD-10-CM

## 2020-03-03 DIAGNOSIS — Z923 Personal history of irradiation: Secondary | ICD-10-CM | POA: Insufficient documentation

## 2020-03-03 DIAGNOSIS — Z853 Personal history of malignant neoplasm of breast: Secondary | ICD-10-CM | POA: Insufficient documentation

## 2020-03-03 DIAGNOSIS — M8580 Other specified disorders of bone density and structure, unspecified site: Secondary | ICD-10-CM

## 2020-03-03 DIAGNOSIS — Z9223 Personal history of estrogen therapy: Secondary | ICD-10-CM | POA: Diagnosis not present

## 2020-03-03 LAB — COMPREHENSIVE METABOLIC PANEL
ALT: 32 U/L (ref 0–44)
AST: 30 U/L (ref 15–41)
Albumin: 4.4 g/dL (ref 3.5–5.0)
Alkaline Phosphatase: 58 U/L (ref 38–126)
Anion gap: 9 (ref 5–15)
BUN: 18 mg/dL (ref 8–23)
CO2: 29 mmol/L (ref 22–32)
Calcium: 9.7 mg/dL (ref 8.9–10.3)
Chloride: 101 mmol/L (ref 98–111)
Creatinine, Ser: 0.64 mg/dL (ref 0.44–1.00)
GFR calc Af Amer: >60 mL/min (ref >60)
GFR calc non Af Amer: >60 mL/min (ref >60)
Glucose, Bld: 102 mg/dL — ABNORMAL HIGH (ref 70–99)
Potassium: 3.8 mmol/L (ref 3.5–5.1)
Sodium: 139 mmol/L (ref 135–145)
Total Bilirubin: 0.6 mg/dL (ref 0.3–1.2)
Total Protein: 7.2 g/dL (ref 6.5–8.1)

## 2020-03-03 MED ORDER — DENOSUMAB 60 MG/ML ~~LOC~~ SOSY
60.0000 mg | PREFILLED_SYRINGE | Freq: Once | SUBCUTANEOUS | Status: AC
  Administered 2020-03-03: 60 mg via SUBCUTANEOUS

## 2020-03-03 MED ORDER — DENOSUMAB 60 MG/ML ~~LOC~~ SOSY
PREFILLED_SYRINGE | SUBCUTANEOUS | Status: AC
  Filled 2020-03-03: qty 1

## 2020-03-03 NOTE — Progress Notes (Signed)
Angela [name] presents today for denosumab injection. Lab work reviewed prior to administration. VSS. Pt reports taking Ca and Vit D as instructed. Pt denies tooth/jaw pain and denies recent or future invasive dental work. Injection tolerated well, see MAR for details. Site clean and dry, band aid applied. Pt discharged in satisfactory condition with follow up instructions.

## 2020-03-03 NOTE — Patient Instructions (Signed)
[facility] at [facility]  Discharge Instructions:  Denosumab injection What is this medicine? DENOSUMAB (den oh sue mab) slows bone breakdown. Prolia is used to treat osteoporosis in women after menopause and in men, and in people who are taking corticosteroids for 6 months or more. Xgeva is used to treat a high calcium level due to cancer and to prevent bone fractures and other bone problems caused by multiple myeloma or cancer bone metastases. Xgeva is also used to treat giant cell tumor of the bone. This medicine may be used for other purposes; ask your health care provider or pharmacist if you have questions. COMMON BRAND NAME(S): Prolia, XGEVA What should I tell my health care provider before I take this medicine? They need to know if you have any of these conditions:  dental disease  having surgery or tooth extraction  infection  kidney disease  low levels of calcium or Vitamin D in the blood  malnutrition  on hemodialysis  skin conditions or sensitivity  thyroid or parathyroid disease  an unusual reaction to denosumab, other medicines, foods, dyes, or preservatives  pregnant or trying to get pregnant  breast-feeding How should I use this medicine? This medicine is for injection under the skin. It is given by a health care professional in a hospital or clinic setting. A special MedGuide will be given to you before each treatment. Be sure to read this information carefully each time. For Prolia, talk to your pediatrician regarding the use of this medicine in children. Special care may be needed. For Xgeva, talk to your pediatrician regarding the use of this medicine in children. While this drug may be prescribed for children as young as 13 years for selected conditions, precautions do apply. Overdosage: If you think you have taken too much of this medicine contact a poison control center or emergency room at once. NOTE: This medicine is only for  you. Do not share this medicine with others. What if I miss a dose? It is important not to miss your dose. Call your doctor or health care professional if you are unable to keep an appointment. What may interact with this medicine? Do not take this medicine with any of the following medications:  other medicines containing denosumab This medicine may also interact with the following medications:  medicines that lower your chance of fighting infection  steroid medicines like prednisone or cortisone This list may not describe all possible interactions. Give your health care provider a list of all the medicines, herbs, non-prescription drugs, or dietary supplements you use. Also tell them if you smoke, drink alcohol, or use illegal drugs. Some items may interact with your medicine. What should I watch for while using this medicine? Visit your doctor or health care professional for regular checks on your progress. Your doctor or health care professional may order blood tests and other tests to see how you are doing. Call your doctor or health care professional for advice if you get a fever, chills or sore throat, or other symptoms of a cold or flu. Do not treat yourself. This drug may decrease your body's ability to fight infection. Try to avoid being around people who are sick. You should make sure you get enough calcium and vitamin D while you are taking this medicine, unless your doctor tells you not to. Discuss the foods you eat and the vitamins you take with your health care professional. See your dentist regularly. Brush and floss your teeth as directed.   Before you have any dental work done, tell your dentist you are receiving this medicine. Do not become pregnant while taking this medicine or for 5 months after stopping it. Talk with your doctor or health care professional about your birth control options while taking this medicine. Women should inform their doctor if they wish to become  pregnant or think they might be pregnant. There is a potential for serious side effects to an unborn child. Talk to your health care professional or pharmacist for more information. What side effects may I notice from receiving this medicine? Side effects that you should report to your doctor or health care professional as soon as possible:  allergic reactions like skin rash, itching or hives, swelling of the face, lips, or tongue  bone pain  breathing problems  dizziness  jaw pain, especially after dental work  redness, blistering, peeling of the skin  signs and symptoms of infection like fever or chills; cough; sore throat; pain or trouble passing urine  signs of low calcium like fast heartbeat, muscle cramps or muscle pain; pain, tingling, numbness in the hands or feet; seizures  unusual bleeding or bruising  unusually weak or tired Side effects that usually do not require medical attention (report to your doctor or health care professional if they continue or are bothersome):  constipation  diarrhea  headache  joint pain  loss of appetite  muscle pain  runny nose  tiredness  upset stomach This list may not describe all possible side effects. Call your doctor for medical advice about side effects. You may report side effects to FDA at 1-800-FDA-1088. Where should I keep my medicine? This medicine is only given in a clinic, doctor's office, or other health care setting and will not be stored at home. NOTE: This sheet is a summary. It may not cover all possible information. If you have questions about this medicine, talk to your doctor, pharmacist, or health care provider.  2020 Elsevier/Gold Standard (2018-01-03 16:10:44)  _______________________________________________________________  Thank you for choosing [facility] to provide your oncology and hematology care.  To afford each patient quality time with our providers,  please arrive at least 15 minutes before your scheduled appointment.  You need to re-schedule your appointment if you arrive 10 or more minutes late.  We strive to give you quality time with our providers, and arriving late affects you and other patients whose appointments are after yours.  Also, if you no show three or more times for appointments you may be dismissed from the clinic.  Again, thank you for choosing [facility] [place] hope is that these requests will allow you access to exceptional care and in a timely manner. _______________________________________________________________  If you have questions after your visit, please contact our office at [phone] between the hours of 8:30 a.m. and 5:00 p.m. Voicemails left after 4:30 p.m. will not be returned until the following business day. _______________________________________________________________  For prescription refill requests, have your pharmacy contact our office. _______________________________________________________________  Recommendations made by the consultant and any test results will be sent to your referring physician. _______________________________________________________________

## 2020-03-06 NOTE — Progress Notes (Signed)
Cardiology Office Note  Date: 03/07/2020   ID: JNAE THOMASTON, DOB 1945/11/05, MRN [id number]  PCP:  Marin Olp, MD  Cardiologist:  Rozann Lesches, MD Electrophysiologist:  None   Chief Complaint: F/U CAD, HLD, HTN  History of Present Illness: [name] is a 74 y.o. female with a history of CAD, HLD, HTN, OSA on CPAP.  Last encounter with Dr Domenic Polite 12/04/2019 via video telemedicine visit. She was doing well without anginal symptoms or NTG use. She was walking a mile/day. Crestor was increased  To 40 mg daily d/t LDL not at goal (81) with FLP and LFT in 8-12 weeks. Continue walking program and medications; ASA, Imdur, Toprol XL, Crestor, and prn NTG.  Patient is here for 81-month follow-up.  Her Crestor was increased to 40 mg at last visit.  Recent lipid panel showed total cholesterol 122, triglycerides 98, HDL 33.2, LDL 70.  Patient denies any recent acute illnesses, hospitalizations.  Denies any progressive anginal or exertional symptoms.  States she does notice an occasional not feeling in her right upper chest and pinpricks in her left upper chest which are brief and not bothersome.  She denies any myalgias from increase in dosage of Crestor.  She takes co-Q10.  She denies any orthostatic symptoms, PND or orthopnea, CVA or TIA-like symptoms, bleeding in stool, lower extremity edema, claudication-like issues.    Past Medical History:  Diagnosis Date   Arthritis    BCC (basal cell carcinoma)    Breast cancer ([place]) 2009   Left Breast Cancer   CAD (coronary artery disease)    Cataract    Essential hypertension    Hyperlipidemia    Macular retinal cyst of right eye    OSA (obstructive sleep apnea)    CPAP   Osteopenia    Personal history of radiation therapy     Past Surgical History:  Procedure Laterality Date   BREAST EXCISIONAL BIOPSY Right    BREAST LUMPECTOMY Left 06/2008   CATARACT EXTRACTION  2012   COLONOSCOPY     Eyelid surgery  Bilateral    INTRAVASCULAR PRESSURE WIRE/FFR STUDY N/A 06/01/2019   Procedure: INTRAVASCULAR PRESSURE WIRE/FFR STUDY;  Surgeon: Belva Crome, MD;  Location: [facility];  Service: Cardiovascular;  Laterality: N/A;   LEFT HEART CATH AND CORONARY ANGIOGRAPHY N/A 06/01/2019   Procedure: LEFT HEART CATH AND CORONARY ANGIOGRAPHY;  Surgeon: Belva Crome, MD;  Location: [facility];  Service: Cardiovascular;  Laterality: N/A;   MASS EXCISION  03/2008   Appendix   SKIN CANCER EXCISION     TONSILLECTOMY      Current Outpatient Medications  Medication Sig Dispense Refill   acetaminophen (TYLENOL) 500 MG tablet Take 500 mg by mouth every 6 (six) hours as needed for moderate pain or headache.     aspirin 81 MG tablet Take 81 mg by mouth every evening.      Calcium Carbonate-Vitamin D (CALTRATE 600+D) 600-400 MG-UNIT per tablet Take 2 tablets by mouth every evening.      Cholecalciferol (VITAMIN D3) 50 MCG (2000 UT) capsule Take 2,000 Units by mouth every evening.      Coenzyme Q10 (COQ-10) 100 MG CAPS Take 100 mg by mouth daily.      denosumab (PROLIA) 60 MG/ML SOLN injection Inject 60 mg into the skin every 6 (six) months.      fish oil-omega-3 fatty acids 1000 MG capsule Take 1 g by mouth every evening.  hydrochlorothiazide (HYDRODIURIL) 25 MG tablet TAKE 1 TABLET BY MOUTH EVERY DAY 90 tablet 1   isosorbide mononitrate (IMDUR) 30 MG 24 hr tablet Take 30 mg by mouth daily.     metoprolol succinate (TOPROL-XL) 50 MG 24 hr tablet TAKE 1 TABLET(50 MG) BY MOUTH EVERY EVENING 30 tablet 3   Multiple Vitamin (MULTIVITAMIN) tablet Take 1 tablet by mouth every evening.      nitroGLYCERIN (NITROSTAT) 0.4 MG SL tablet Place 1 tablet (0.4 mg total) under the tongue every 5 (five) minutes as needed for chest pain. 25 tablet 6   potassium chloride SA (KLOR-CON) 20 MEQ tablet TAKE 1 TABLET BY MOUTH EVERY DAY 90 tablet 1   rosuvastatin (CRESTOR) 40 MG tablet Take 1 tablet (40 mg  total) by mouth daily. 90 tablet 3   vitamin C (ASCORBIC ACID) 500 MG tablet Take 500 mg by mouth every evening.      ZINC-A-COLD MT Use as directed in the mouth or throat. Cold Ease     No current facility-administered medications for this visit.   Allergies:  Lisinopril and Statins   Social History: The patient  reports that she has never smoked. She has never used smokeless tobacco. She reports current alcohol use of about 3.0 standard drinks of alcohol per week. She reports that she does not use drugs.   Family History: The patient's family history includes Heart attack in her father and mother; Heart disease in her father; Hypertension in her father and mother.   ROS:  Please see the history of present illness. Otherwise, complete review of systems is positive for none.  All other systems are reviewed and negative.   Physical Exam: VS:  BP 118/72    Pulse 69    Ht 5\' 6"  (1.676 m)    Wt 165 lb 12.8 oz (75.2 kg)    LMP  (LMP Unknown)    SpO2 94%    BMI 26.76 kg/m , BMI Body mass index is 26.76 kg/m.  Wt Readings from Last 3 Encounters:  03/07/20 165 lb 12.8 oz (75.2 kg)  01/26/20 166 lb 6.4 oz (75.5 kg)  12/04/19 169 lb (76.7 kg)    General: Patient appears comfortable at rest. Neck: Supple, no elevated JVP or carotid bruits, no thyromegaly. Lungs: Clear to auscultation, nonlabored breathing at rest. Cardiac: Regular rate and rhythm, no S3 or significant systolic murmur, no pericardial rub. Extremities: No pitting edema, distal pulses 2+. Skin: Warm and dry. Musculoskeletal: No kyphosis. Neuropsychiatric: Alert and oriented x3, affect grossly appropriate.  ECG:  EKG from June 01, 2019 showed sinus bradycardia rate of 56, minimal voltage criteria for LVH  Recent Labwork: 01/26/2020: Hemoglobin 14.1; Platelets 217.0 03/03/2020: ALT 32; AST 30; BUN 18; Creatinine, Ser 0.64; Potassium 3.8; Sodium 139     Component Value Date/Time   CHOL 122 01/26/2020 1022   CHOL 169  01/14/2017 0835   TRIG 98.0 01/26/2020 1022   HDL 33.20 (L) 01/26/2020 1022   HDL 34 (L) 01/14/2017 0835   CHOLHDL 4 01/26/2020 1022   VLDL 19.6 01/26/2020 1022   LDLCALC 70 01/26/2020 1022   LDLCALC 90 01/14/2017 0835   LDLDIRECT 170.3 07/09/2013 0813    Other Studies Reviewed Today:   Cardiac catheterization 06/01/2019:  Right dominant coronary anatomy.  Moderate to heavy LAD and circumflex calcification.  Widely patent left main.  The proximal to mid LAD is heavily calcified and contains segmental 50% tubular narrowing over approximately 20 mm. The mid LAD contains relatively focal  calcified 65 to 75% stenosis. The LAD beyond this segment is widely patent. The first diagonal contains 60% ostial narrowing and arises from the calcified tubular stenosis.  Both DFR (0.9) and FFR (0.82)  Circumflex coronary artery contains no significant obstruction. The second obtuse marginal contains 50 to 60% ostial narrowing.  RCA is dominant with proximal 30 to 40% narrowing and mid 25% narrowing.  Normal left ventricular size and function.  Recommendations:   Aggressive risk factor modification including consideration of PCSK9 if unable to tolerate statins and the addition of VASCEPA.  If we entertain intervention on the LAD despite absence of hemodynamic data for significant stenosis, we need to be performed from the right femoral for better guide catheter support. Would likely also perform atherectomy to ensure stent expansion.  Assessment and Plan:  1. CAD in native artery   2. Mixed hyperlipidemia   3. Essential hypertension    1. CAD in native artery Denies any recent progressive anginal symptoms.  Continue aspirin 81 mg, nitroglycerin 0.4 mg sublingual as needed chest pain.  Continue Toprol XL 50 mg daily  2. Mixed hyperlipidemia Recent LDL 70.  At goal.  Continue Crestor 40 mg daily.  Continue omega-3 fatty acids 1000 mg capsule.  3. HTN Blood pressure well  controlled on current therapy.  Continue HCTZ 25 mg daily, continue Toprol-XL 50 mg daily.  Medication Adjustments/Labs and Tests Ordered: Current medicines are reviewed at length with the patient today.  Concerns regarding medicines are outlined above.   Disposition: Follow-up with Dr. Domenic Polite or APP 6 months.  Signed, Levell July, NP 03/07/2020 10:36 AM    [facility], [place], [place] [address] Phone: [phone]; Fax: [phone]

## 2020-03-07 ENCOUNTER — Encounter: Payer: Self-pay | Admitting: Family Medicine

## 2020-03-07 ENCOUNTER — Ambulatory Visit: Payer: Medicare PPO | Admitting: Family Medicine

## 2020-03-07 VITALS — BP 118/72 | HR 69 | Ht 66.0 in | Wt 165.8 lb

## 2020-03-07 DIAGNOSIS — E782 Mixed hyperlipidemia: Secondary | ICD-10-CM | POA: Diagnosis not present

## 2020-03-07 DIAGNOSIS — I10 Essential (primary) hypertension: Secondary | ICD-10-CM | POA: Diagnosis not present

## 2020-03-07 DIAGNOSIS — I2510 Atherosclerotic heart disease of native coronary artery without angina pectoris: Secondary | ICD-10-CM

## 2020-03-07 NOTE — Patient Instructions (Addendum)
Medication Instructions:    Your physician recommends that you continue on your current medications as directed. Please refer to the Current Medication list given to you today.  Labwork:  NONE  Testing/Procedures:  NONE  Follow-Up:  Your physician recommends that you schedule a follow-up appointment in: 6 months (office)  Any Other Special Instructions Will Be Listed Below (If Applicable).  If you need a refill on your cardiac medications before your next appointment, please call your pharmacy. 

## 2020-03-08 ENCOUNTER — Encounter (INDEPENDENT_AMBULATORY_CARE_PROVIDER_SITE_OTHER): Payer: Self-pay | Admitting: Ophthalmology

## 2020-03-08 ENCOUNTER — Ambulatory Visit (INDEPENDENT_AMBULATORY_CARE_PROVIDER_SITE_OTHER): Payer: Medicare PPO | Admitting: Ophthalmology

## 2020-03-08 ENCOUNTER — Other Ambulatory Visit: Payer: Self-pay

## 2020-03-08 DIAGNOSIS — H33101 Unspecified retinoschisis, right eye: Secondary | ICD-10-CM | POA: Diagnosis not present

## 2020-03-08 DIAGNOSIS — H2512 Age-related nuclear cataract, left eye: Secondary | ICD-10-CM

## 2020-03-08 DIAGNOSIS — H4321 Crystalline deposits in vitreous body, right eye: Secondary | ICD-10-CM | POA: Diagnosis not present

## 2020-03-08 DIAGNOSIS — H35073 Retinal telangiectasis, bilateral: Secondary | ICD-10-CM | POA: Diagnosis not present

## 2020-03-08 HISTORY — DX: Age-related nuclear cataract, left eye: H25.12

## 2020-03-08 NOTE — Assessment & Plan Note (Signed)
No signs of retinal hole or tear, observe

## 2020-03-08 NOTE — Progress Notes (Signed)
03/08/2020     CHIEF COMPLAINT Patient presents for Retina Follow Up   HISTORY OF PRESENT ILLNESS: Angela [name] is a 74 y.o. 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  Severity is moderate.  Duration of 9 months.  Since onset it is stable.  I, the attending physician,  performed the HPI with the patient and updated documentation appropriately.          Comments    9 Month f\u OU. OCT Pt uses CPAP every night  Pt states no changes in vision. Pt states she was driving and it seemed like there was water or steam in the road due to changes in vision. Pt states when she reached her destination it stopped. This happened at the beginning of May and it was the only incident       Last edited by Tilda Franco on 03/08/2020  2:14 PM. (History)      Referring physician: Marin Olp, MD [address] [place],  [place] [id number]  HISTORICAL INFORMATION:   Selected notes from the [facility]    Lab Results  Component Value Date   HGBA1C 5.9 01/26/2020     CURRENT MEDICATIONS: No current outpatient medications on file. (Ophthalmic Drugs)   No current facility-administered medications for this visit. (Ophthalmic Drugs)   Current Outpatient Medications (Other)  Medication Sig  . acetaminophen (TYLENOL) 500 MG tablet Take 500 mg by mouth every 6 (six) hours as needed for moderate pain or headache.  Marland Kitchen aspirin 81 MG tablet Take 81 mg by mouth every evening.   . Calcium Carbonate-Vitamin D (CALTRATE 600+D) 600-400 MG-UNIT per tablet Take 2 tablets by mouth every evening.   . Cholecalciferol (VITAMIN D3) 50 MCG (2000 UT) capsule Take 2,000 Units by mouth every evening.   . Coenzyme Q10 (COQ-10) 100 MG CAPS Take 100 mg by mouth daily.   Marland Kitchen denosumab (PROLIA) 60 MG/ML SOLN injection Inject 60 mg into the skin every 6 (six) months.   . fish oil-omega-3 fatty acids 1000 MG capsule Take 1 g by mouth  every evening.   . hydrochlorothiazide (HYDRODIURIL) 25 MG tablet TAKE 1 TABLET BY MOUTH EVERY DAY  . isosorbide mononitrate (IMDUR) 30 MG 24 hr tablet Take 30 mg by mouth daily.  . metoprolol succinate (TOPROL-XL) 50 MG 24 hr tablet TAKE 1 TABLET(50 MG) BY MOUTH EVERY EVENING  . Multiple Vitamin (MULTIVITAMIN) tablet Take 1 tablet by mouth every evening.   . nitroGLYCERIN (NITROSTAT) 0.4 MG SL tablet Place 1 tablet (0.4 mg total) under the tongue every 5 (five) minutes as needed for chest pain.  . potassium chloride SA (KLOR-CON) 20 MEQ tablet TAKE 1 TABLET BY MOUTH EVERY DAY  . rosuvastatin (CRESTOR) 40 MG tablet Take 1 tablet (40 mg total) by mouth daily.  . vitamin C (ASCORBIC ACID) 500 MG tablet Take 500 mg by mouth every evening.   Marland Kitchen ZINC-A-COLD MT Use as directed in the mouth or throat. Cold Ease   No current facility-administered medications for this visit. (Other)      REVIEW OF SYSTEMS:    ALLERGIES Allergies  Allergen Reactions  . Lisinopril Cough  . Statins Other (See Comments)    Myalgia with lipitor- unproven She is able to tolerate simvastatin    PAST MEDICAL HISTORY Past Medical History:  Diagnosis Date  . Arthritis   .  BCC (basal cell carcinoma)   . Breast cancer ([facility]) 2009   Left Breast Cancer  . CAD (coronary artery disease)   . Cataract   . Essential hypertension   . Hyperlipidemia   . Macular retinal cyst of right eye   . OSA (obstructive sleep apnea)    CPAP  . Osteopenia   . Personal history of radiation therapy    Past Surgical History:  Procedure Laterality Date  . BREAST EXCISIONAL BIOPSY Right   . BREAST LUMPECTOMY Left 06/2008  . CATARACT EXTRACTION  2012  . COLONOSCOPY    . Eyelid surgery Bilateral   . INTRAVASCULAR PRESSURE WIRE/FFR STUDY N/A 06/01/2019   Procedure: INTRAVASCULAR PRESSURE WIRE/FFR STUDY;  Surgeon: Belva Crome, MD;  Location: [facility];  Service: Cardiovascular;  Laterality: N/A;  . LEFT HEART CATH AND  CORONARY ANGIOGRAPHY N/A 06/01/2019   Procedure: LEFT HEART CATH AND CORONARY ANGIOGRAPHY;  Surgeon: Belva Crome, MD;  Location: [facility];  Service: Cardiovascular;  Laterality: N/A;  . MASS EXCISION  03/2008   Appendix  . SKIN CANCER EXCISION    . TONSILLECTOMY      FAMILY HISTORY Family History  Problem Relation Age of Onset  . Heart attack Mother   . Hypertension Mother   . Heart disease Father   . Heart attack Father   . Hypertension Father   . Colon cancer Neg Hx   . Stroke Neg Hx   . Esophageal cancer Neg Hx   . Rectal cancer Neg Hx   . Stomach cancer Neg Hx     SOCIAL HISTORY Social History   Tobacco Use  . Smoking status: Never Smoker  . Smokeless tobacco: Never Used  Vaping Use  . Vaping Use: Never used  Substance Use Topics  . Alcohol use: Yes    Alcohol/week: 3.0 standard drinks    Types: 3 Glasses of wine per week    Comment: one glass a week  . Drug use: No         OPHTHALMIC EXAM:  Base Eye Exam    Visual Acuity (Snellen - Linear)      Right Left   Dist cc 20/40 + 20/30 +   Dist ph cc NI        Tonometry (Tonopen, 2:18 PM)      Right Left   Pressure 12 13       Pupils      Pupils Dark Light Shape React APD   Right PERRL 4 3 Round Brisk None   Left PERRL 4 3 Round Brisk None       Visual Fields (Counting fingers)      Left Right    Full Full       Neuro/Psych    Oriented x3: Yes   Mood/Affect: Normal       Dilation    Both eyes: 1.0% Mydriacyl, 2.5% Phenylephrine @ 2:18 PM        Slit Lamp and Fundus Exam    External Exam      Right Left   External Normal Normal       Slit Lamp Exam      Right Left   Lids/Lashes Normal Normal   Conjunctiva/Sclera White and quiet White and quiet   Cornea Clear Clear   Anterior Chamber Deep and quiet Deep and quiet   Iris Round and reactive Round and reactive   Lens Centered posterior chamber intraocular lens 2+ Nuclear sclerosis  Anterior Vitreous Normal Normal         Fundus Exam      Right Left   Posterior Vitreous Asteroid hyalosis Normal   Disc Normal Normal   C/D Ratio 0.25 0.25   Macula Pseudocystoid change in the fovea, no visible microaneurysms, no visible right ankle venules Temporal aspect of the fovea with a small old microaneurysms, no exudate, no crystals retina   Vessels Normal Normal   Periphery Inferotemporal peripheral retina schisis no inner and outer holes Normal          IMAGING AND PROCEDURES  Imaging and Procedures for 03/08/20  OCT, Retina - OU - Both Eyes       Right Eye Quality was good. Scan locations included subfoveal. Central Foveal Thickness: 230. Progression has improved. Findings include cystoid macular edema.   Left Eye Central Foveal Thickness: 245. Progression has been stable. Findings include cystoid macular edema.   Notes OU with, micro-CME centrally and old outer retinal scarring each from prior MAC-TEL, no longer progressing , No longer active, resolved on the temporal aspect of the fovea OD.                ASSESSMENT/PLAN:  Macular telangiectasia of both eyes Macular telangiectasis (MAC-TEL), or parafoveal telangiectasis is a condition of "unknown" cause.  Findings in or near the macula (center of vision) consist of microaneurysms (leaking small capillaries), often with leakage of fluid which in the active phase can impact fine discriminatory vision, and in some cases trigger profound scarring in the macula, with severe permanent vision loss.  Standard treatment is observation and periodic examinations to monitor for treatable complications.   The cause  of this condition is "unknown".  However, the practice of Dr. Zadie Rhine has discovered an association with sleep apnea with its nightly periods of low oxygen in the blood stream (hypoxia), retained carbon dioxide (hypercapnia), associated with transient nocturnal hypertensive episodes.   More recently, some patients also been found to have advanced  lung disease, whether asthma or COPD, with similar findings.  Dr. Zadie Rhine has been evaluating the association of sleep apnea, nightly hypoxia, and Macular telangiectasis for over 18 years.  Most patients are found to be noncompliant with sleep apnea therapy or testing in the past.  Resumption of CPAP or similar therapy is strongly recommended if ordered in the past.  Upon review of risk factors or findings positive for sleep apnea, more formal, extensive sleep laboratory or home testing, may be recommended.  Numerous patients, proven to have MAC-TEL, have improved or resolved their ey condition promptly, within weeks, of the use of nighttime oxygen supplementation or continuous positive airway pressure (CPAP).  OU now resolved active macular telangiectasis.  On the temporal watershed region of the right eye, no intraretinal cyst remain.  Old foveal cyst exists centrally with small outer retinal change.  Nonetheless this is stable while using her CPAP.  Similar left eye.  She no longer uses topical NSAIDs over the last 4 years OD.  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 Nuclear sclerotic cataract of left eye  H25.12     1.  No specific therapy for retinoschisis required  2.  10 you on CPAP to  prevent progression of  telangiectasia of the macula  3.  Ophthalmic Meds Ordered this visit:  No orders of the defined types were placed in this encounter.      Return in about 7 months (around 10/08/2020) for DILATE OU, OCT.  There are no Patient Instructions on file for this visit.   Explained the diagnoses, plan, and follow up with the patient and they expressed understanding.  Patient expressed understanding of the importance of proper follow up care.   Clent Demark Lively Haberman  M.D. Diseases & Surgery of the Retina and Vitreous [facility] [place] 03/08/20     Abbreviations: M myopia (nearsighted); A astigmatism; H hyperopia (farsighted); P presbyopia; Mrx spectacle prescription;  CTL contact lenses; OD right eye; OS left eye; OU both eyes  XT exotropia; ET esotropia; PEK punctate epithelial keratitis; PEE punctate epithelial erosions; DES dry eye syndrome; MGD meibomian gland dysfunction; ATs artificial tears; PFAT's preservative free artificial tears; [place] nuclear sclerotic cataract; PSC posterior subcapsular cataract; ERM epi-retinal membrane; PVD posterior vitreous detachment; RD retinal detachment; DM diabetes mellitus; DR diabetic retinopathy; NPDR non-proliferative diabetic retinopathy; PDR proliferative diabetic retinopathy; CSME clinically significant macular edema; DME diabetic macular edema; dbh dot blot hemorrhages; CWS cotton wool spot; POAG primary open angle glaucoma; C/D cup-to-disc ratio; HVF humphrey visual field; GVF goldmann visual field; OCT optical coherence tomography; IOP intraocular pressure; BRVO Branch retinal vein occlusion; CRVO central retinal vein occlusion; CRAO central retinal artery occlusion; BRAO branch retinal artery occlusion; RT retinal tear; SB scleral buckle; PPV pars plana vitrectomy; VH Vitreous hemorrhage; PRP panretinal laser photocoagulation; IVK intravitreal kenalog; VMT vitreomacular traction; MH Macular hole;  NVD neovascularization of the disc; NVE neovascularization elsewhere; AREDS age related eye disease study; ARMD age related macular degeneration; POAG primary open angle glaucoma; EBMD epithelial/anterior basement membrane dystrophy; ACIOL anterior chamber intraocular lens; IOL intraocular lens; PCIOL posterior chamber intraocular lens; Phaco/IOL phacoemulsification with intraocular lens placement; [place] photorefractive keratectomy; LASIK laser assisted in situ keratomileusis; HTN hypertension; DM diabetes mellitus; COPD  chronic obstructive pulmonary disease

## 2020-03-08 NOTE — Assessment & Plan Note (Signed)
Macular telangiectasis (MAC-TEL), or parafoveal telangiectasis is a condition of "unknown" cause.  Findings in or near the macula (center of vision) consist of microaneurysms (leaking small capillaries), often with leakage of fluid which in the active phase can impact fine discriminatory vision, and in some cases trigger profound scarring in the macula, with severe permanent vision loss.  Standard treatment is observation and periodic examinations to monitor for treatable complications.   The cause  of this condition is "unknown".  However, the practice of Dr. Zadie Rhine has discovered an association with sleep apnea with its nightly periods of low oxygen in the blood stream (hypoxia), retained carbon dioxide (hypercapnia), associated with transient nocturnal hypertensive episodes.   More recently, some patients also been found to have advanced lung disease, whether asthma or COPD, with similar findings.  Dr. Zadie Rhine has been evaluating the association of sleep apnea, nightly hypoxia, and Macular telangiectasis for over 18 years.  Most patients are found to be noncompliant with sleep apnea therapy or testing in the past.  Resumption of CPAP or similar therapy is strongly recommended if ordered in the past.  Upon review of risk factors or findings positive for sleep apnea, more formal, extensive sleep laboratory or home testing, may be recommended.  Numerous patients, proven to have MAC-TEL, have improved or resolved their ey condition promptly, within weeks, of the use of nighttime oxygen supplementation or continuous positive airway pressure (CPAP).  OU now resolved active macular telangiectasis.  On the temporal watershed region of the right eye, no intraretinal cyst remain.  Old foveal cyst exists centrally with small outer retinal change.  Nonetheless this is stable while using her CPAP.  Similar left eye.  She no longer uses topical NSAIDs over the last 4 years OD.  Was not pseudophakic CME

## 2020-03-30 DIAGNOSIS — Z20828 Contact with and (suspected) exposure to other viral communicable diseases: Secondary | ICD-10-CM | POA: Diagnosis not present

## 2020-04-20 ENCOUNTER — Encounter: Payer: Self-pay | Admitting: Primary Care

## 2020-04-20 ENCOUNTER — Other Ambulatory Visit: Payer: Self-pay

## 2020-04-20 ENCOUNTER — Ambulatory Visit: Payer: Medicare PPO | Admitting: Primary Care

## 2020-04-20 VITALS — BP 150/76 | HR 56 | Temp 96.1°F | Ht 66.0 in | Wt 168.4 lb

## 2020-04-20 DIAGNOSIS — G4733 Obstructive sleep apnea (adult) (pediatric): Secondary | ICD-10-CM | POA: Diagnosis not present

## 2020-04-20 NOTE — Progress Notes (Signed)
@Patient  ID: Angela [name], female    DOB: 13-Apr-1946, 74 y.o.   MRN: [id number]  Chief Complaint  Patient presents with  . Follow-up    Referring provider: Marin Olp, MD  HPI: 74 year old female, never smoked.  Past medical history significant for obstructive sleep apnea on CPAP.  Patient of Dr. Halford Chessman, last seen by pulmonary nurse practitioner on 02/04/2019. HST 08/15/15 >> AHI 52.9 SaO2 low 65%.  04/20/2020 Patient presents today for a 1 year follow-up for OSA.  Sleeping well, no issues with mask fit or pressure setting. She wakes up on average twice at night to use the restroom. Bedtime is 11pm and wakes up 7-8am. Denies snoring or daytime fatigue. She uses F&P nasal mask with chin strap. She is following with retina specialist Dr. Tacy Learn, dx with Retina MacTel. She was told that there is a correlation with sleep apnea.   [facility] download 03/19/20- 04/17/20: 30/30 days used; 100% >4 hours  Average usage 8 hours 55 mins Pressure 5-15cm h20 (13.3 cm h20- 95%) AHI 0.4   Allergies  Allergen Reactions  . Lisinopril Cough  . Statins Other (See Comments)    Myalgia with lipitor- unproven She is able to tolerate simvastatin    Immunization History  Administered Date(s) Administered  . Fluad Quad(high Dose 65+) 06/12/2019  . Hep A / Hep B 03/16/2013, 10/06/2013  . Hepatitis B, adult 04/17/2013  . Influenza Split 06/09/2012  . Influenza Whole 05/22/2011  . Influenza,inj,Quad PF,6+ Mos 05/27/2013  . Influenza-Unspecified 06/10/2014, 05/24/2015, 04/24/2016, 04/26/2017, 05/11/2018  . Moderna SARS-COVID-2 Vaccination 10/13/2019, 11/12/2019  . Pneumococcal Conjugate-13 11/12/2014  . Pneumococcal Polysaccharide-23 08/24/2008, 07/17/2013  . Td 04/19/2010  . Tdap 04/05/2016  . Zoster 11/17/2008  . Zoster Recombinat (Shingrix) 07/01/2017, 09/20/2017    Past Medical History:  Diagnosis Date  . Arthritis   . BCC (basal cell carcinoma)   . Breast cancer ([facility]) 2009   Left Breast  Cancer  . CAD (coronary artery disease)   . Cataract   . Essential hypertension   . Hyperlipidemia   . Macular retinal cyst of right eye   . OSA (obstructive sleep apnea)    CPAP  . Osteopenia   . Personal history of radiation therapy     Tobacco History: Social History   Tobacco Use  Smoking Status Never Smoker  Smokeless Tobacco Never Used   Counseling given: Not Answered   Outpatient Medications Prior to Visit  Medication Sig Dispense Refill  . acetaminophen (TYLENOL) 500 MG tablet Take 500 mg by mouth every 6 (six) hours as needed for moderate pain or headache.    Marland Kitchen aspirin 81 MG tablet Take 81 mg by mouth every evening.     . Calcium Carbonate-Vitamin D (CALTRATE 600+D) 600-400 MG-UNIT per tablet Take 2 tablets by mouth every evening.     . Cholecalciferol (VITAMIN D3) 50 MCG (2000 UT) capsule Take 2,000 Units by mouth every evening.     . Coenzyme Q10 (COQ-10) 100 MG CAPS Take 100 mg by mouth daily.     Marland Kitchen denosumab (PROLIA) 60 MG/ML SOLN injection Inject 60 mg into the skin every 6 (six) months.     . fish oil-omega-3 fatty acids 1000 MG capsule Take 1 g by mouth every evening.     . hydrochlorothiazide (HYDRODIURIL) 25 MG tablet TAKE 1 TABLET BY MOUTH EVERY DAY 90 tablet 1  . isosorbide mononitrate (IMDUR) 30 MG 24 hr tablet Take 30 mg by mouth daily.    Marland Kitchen  metoprolol succinate (TOPROL-XL) 50 MG 24 hr tablet TAKE 1 TABLET(50 MG) BY MOUTH EVERY EVENING 30 tablet 3  . Multiple Vitamin (MULTIVITAMIN) tablet Take 1 tablet by mouth every evening.     . nitroGLYCERIN (NITROSTAT) 0.4 MG SL tablet Place 1 tablet (0.4 mg total) under the tongue every 5 (five) minutes as needed for chest pain. 25 tablet 6  . potassium chloride SA (KLOR-CON) 20 MEQ tablet TAKE 1 TABLET BY MOUTH EVERY DAY 90 tablet 1  . rosuvastatin (CRESTOR) 40 MG tablet Take 1 tablet (40 mg total) by mouth daily. 90 tablet 3  . vitamin C (ASCORBIC ACID) 500 MG tablet Take 500 mg by mouth every evening.     Marland Kitchen  ZINC-A-COLD MT Use as directed in the mouth or throat. Cold Ease     No facility-administered medications prior to visit.    Review of Systems  Review of Systems  Constitutional: Negative.   HENT: Negative.   Respiratory: Negative.   Cardiovascular: Negative.    Physical Exam  BP (!) 150/76 (BP Location: Right Arm, Cuff Size: Normal)   Pulse (!) 56   Temp (!) 96.1 F (35.6 C) (Temporal)   Ht 5\' 6"  (1.676 m)   Wt 168 lb 6.4 oz (76.4 kg)   LMP  (LMP Unknown)   SpO2 97% Comment: RA  BMI 27.18 kg/m  Physical Exam Constitutional:      General: She is not in acute distress.    Appearance: Normal appearance. She is normal weight. She is not ill-appearing.  HENT:     Mouth/Throat:     Mouth: Mucous membranes are moist.     Pharynx: Oropharynx is clear. No oropharyngeal exudate or posterior oropharyngeal erythema.  Cardiovascular:     Rate and Rhythm: Normal rate and regular rhythm.  Pulmonary:     Effort: Pulmonary effort is normal.     Breath sounds: Normal breath sounds. No wheezing or rales.  Musculoskeletal:     Cervical back: Normal range of motion and neck supple.  Neurological:     General: No focal deficit present.     Mental Status: She is alert and oriented to person, place, and time. Mental status is at baseline.  Psychiatric:        Mood and Affect: Mood normal.        Behavior: Behavior normal.        Thought Content: Thought content normal.        Judgment: Judgment normal.      Lab Results:  CBC    Component Value Date/Time   WBC 6.8 01/26/2020 1022   RBC 4.32 01/26/2020 1022   HGB 14.1 01/26/2020 1022   HCT 41.1 01/26/2020 1022   PLT 217.0 01/26/2020 1022   MCV 95.1 01/26/2020 1022   MCH 32.2 08/26/2019 1051   MCHC 34.3 01/26/2020 1022   RDW 12.4 01/26/2020 1022   LYMPHSABS 2.4 01/26/2020 1022   MONOABS 0.7 01/26/2020 1022   EOSABS 0.2 01/26/2020 1022   BASOSABS 0.0 01/26/2020 1022    BMET    Component Value Date/Time   NA 139  03/03/2020 1108   NA 139 02/05/2017 0843   K 3.8 03/03/2020 1108   CL 101 03/03/2020 1108   CO2 29 03/03/2020 1108   GLUCOSE 102 (H) 03/03/2020 1108   BUN 18 03/03/2020 1108   BUN 17 02/05/2017 0843   CREATININE 0.64 03/03/2020 1108   CALCIUM 9.7 03/03/2020 1108   GFRNONAA >60 03/03/2020 1108   GFRAA >60  03/03/2020 1108    BNP    Component Value Date/Time   BNP 43.0 08/18/2018 0230    ProBNP No results found for: PROBNP  Imaging: No results found.   Assessment & Plan:   OSA (obstructive sleep apnea) - Severe obstructive sleep apnea/ HST 08/15/15 >> AHI 52.9 SaO2 low 65%. - Patient is compliant with CPAP and reports benefit from use - Auto CPAP 5-15cm h20; AHI 0.4 - No changes today in pressure setting. Patient to continue to wear CPAP every night for minimum of 4-6 hours or more. Advised not to drive if experiencing excessive daytime fatigue or somnolence - Renew CPAP order and supplies (prescription printed) - Follow-up in 1 year with Dr. Halford Chessman or sooner if needed    Martyn Ehrich, NP 04/20/2020

## 2020-04-20 NOTE — Progress Notes (Signed)
Reviewed and agree with assessment/plan.   Chesley Mires, MD [facility] Pulmonary/Critical Care 04/20/2020, 3:01 PM Pager:  [phone]

## 2020-04-20 NOTE — Patient Instructions (Addendum)
Pleasure seeing you today Angela [name]  OSA: Continue to wear CPAP every night for minimum of 4-6 hours or more Do not drive if experiencing excessive daytime fatigue or somnolence  Orders: PRINT SLEEP STUDY PRINT ORDER FOR AUTO CPAP 5-15cm h20  RENEW ORDERS FOR NASAL MASK, HEADGEAR, CHIN STRAP, FILTERS  Follow-up: 1 year with Dr. Halford Chessman    CPAP and BPAP Information CPAP and BPAP are methods of helping a person breathe with the use of air pressure. CPAP stands for "continuous positive airway pressure." BPAP stands for "bi-level positive airway pressure." In both methods, air is blown through your nose or mouth and into your air passages to help you breathe well. CPAP and BPAP use different amounts of pressure to blow air. With CPAP, the amount of pressure stays the same while you breathe in and out. With BPAP, the amount of pressure is increased when you breathe in (inhale) so that you can take larger breaths. Your health care provider will recommend whether CPAP or BPAP would be more helpful for you. Why are CPAP and BPAP treatments used? CPAP or BPAP can be helpful if you have:  Sleep apnea.  Chronic obstructive pulmonary disease (COPD).  Heart failure.  Medical conditions that weaken the muscles of the chest including muscular dystrophy, or neurological diseases such as amyotrophic lateral sclerosis (ALS).  Other problems that cause breathing to be weak, abnormal, or difficult. CPAP is most commonly used for obstructive sleep apnea (OSA) to keep the airways from collapsing when the muscles relax during sleep. How is CPAP or BPAP administered? Both CPAP and BPAP are provided by a small machine with a flexible plastic tube that attaches to a plastic mask. You wear the mask. Air is blown through the mask into your nose or mouth. The amount of pressure that is used to blow the air can be adjusted on the machine. Your health care provider will determine the pressure setting that should be  used based on your individual needs. When should CPAP or BPAP be used? In most cases, the mask only needs to be worn during sleep. Generally, the mask needs to be worn throughout the night and during any daytime naps. People with certain medical conditions may also need to wear the mask at other times when they are awake. Follow instructions from your health care provider about when to use the machine. What are some tips for using the mask?   Because the mask needs to be snug, some people feel trapped or closed-in (claustrophobic) when first using the mask. If you feel this way, you may need to get used to the mask. One way to do this is by holding the mask loosely over your nose or mouth and then gradually applying the mask more snugly. You can also gradually increase the amount of time that you use the mask.  Masks are available in various types and sizes. Some fit over your mouth and nose while others fit over just your nose. If your mask does not fit well, talk with your health care provider about getting a different one.  If you are using a mask that fits over your nose and you tend to breathe through your mouth, a chin strap may be applied to help keep your mouth closed.  The CPAP and BPAP machines have alarms that may sound if the mask comes off or develops a leak.  If you have trouble with the mask, it is very important that you talk with your health  care provider about finding a way to make the mask easier to tolerate. Do not stop using the mask. Stopping the use of the mask could have a negative impact on your health. What are some tips for using the machine?  Place your CPAP or BPAP machine on a secure table or stand near an electrical outlet.  Know where the on/off switch is located on the machine.  Follow instructions from your health care provider about how to set the pressure on your machine and when you should use it.  Do not eat or drink while the CPAP or BPAP machine is on.  Food or fluids could get pushed into your lungs by the pressure of the CPAP or BPAP.  Do not smoke. Tobacco smoke residue can damage the machine.  For home use, CPAP and BPAP machines can be rented or purchased through home health care companies. Many different brands of machines are available. Renting a machine before purchasing may help you find out which particular machine works well for you.  Keep the CPAP or BPAP machine and attachments clean. Ask your health care provider for specific instructions. Get help right away if:  You have redness or open areas around your nose or mouth where the mask fits.  You have trouble using the CPAP or BPAP machine.  You cannot tolerate wearing the CPAP or BPAP mask.  You have pain, discomfort, and bloating in your abdomen. Summary  CPAP and BPAP are methods of helping a person breathe with the use of air pressure.  Both CPAP and BPAP are provided by a small machine with a flexible plastic tube that attaches to a plastic mask.  If you have trouble with the mask, it is very important that you talk with your health care provider about finding a way to make the mask easier to tolerate. This information is not intended to replace advice given to you by your health care provider. Make sure you discuss any questions you have with your health care provider. Document Revised: 12/17/2018 Document Reviewed: 07/16/2016 Elsevier Patient Education  [place].

## 2020-04-20 NOTE — Assessment & Plan Note (Signed)
-   Severe obstructive sleep apnea/ HST 08/15/15 >> AHI 52.9 SaO2 low 65%. - Patient is compliant with CPAP and reports benefit from use - Auto CPAP 5-15cm h20; AHI 0.4 - No changes today in pressure setting. Patient to continue to wear CPAP every night for minimum of 4-6 hours or more. Advised not to drive if experiencing excessive daytime fatigue or somnolence - Renew CPAP order and supplies (prescription printed) - Follow-up in 1 year with Dr. Halford Chessman or sooner if needed

## 2020-04-28 DIAGNOSIS — Z20828 Contact with and (suspected) exposure to other viral communicable diseases: Secondary | ICD-10-CM | POA: Diagnosis not present

## 2020-05-03 ENCOUNTER — Telehealth: Payer: Self-pay | Admitting: Primary Care

## 2020-05-03 NOTE — Telephone Encounter (Signed)
Notes faxed and Abby called

## 2020-05-04 ENCOUNTER — Encounter: Payer: Self-pay | Admitting: Family Medicine

## 2020-05-09 ENCOUNTER — Telehealth: Payer: Self-pay | Admitting: Primary Care

## 2020-05-09 NOTE — Telephone Encounter (Signed)
[facility] faxed back spoke to [facility] they did receive it

## 2020-05-25 DIAGNOSIS — G4733 Obstructive sleep apnea (adult) (pediatric): Secondary | ICD-10-CM | POA: Diagnosis not present

## 2020-06-01 ENCOUNTER — Ambulatory Visit: Payer: Medicare PPO | Admitting: Podiatry

## 2020-06-02 ENCOUNTER — Other Ambulatory Visit: Payer: Self-pay | Admitting: Family Medicine

## 2020-06-03 ENCOUNTER — Encounter: Payer: Self-pay | Admitting: Podiatry

## 2020-06-03 ENCOUNTER — Other Ambulatory Visit: Payer: Self-pay

## 2020-06-03 ENCOUNTER — Ambulatory Visit: Payer: Medicare PPO | Admitting: Podiatry

## 2020-06-03 ENCOUNTER — Ambulatory Visit (INDEPENDENT_AMBULATORY_CARE_PROVIDER_SITE_OTHER): Payer: Medicare PPO

## 2020-06-03 DIAGNOSIS — M7731 Calcaneal spur, right foot: Secondary | ICD-10-CM

## 2020-06-03 DIAGNOSIS — Z8249 Family history of ischemic heart disease and other diseases of the circulatory system: Secondary | ICD-10-CM | POA: Diagnosis not present

## 2020-06-03 DIAGNOSIS — Z85828 Personal history of other malignant neoplasm of skin: Secondary | ICD-10-CM | POA: Diagnosis not present

## 2020-06-03 DIAGNOSIS — Z79899 Other long term (current) drug therapy: Secondary | ICD-10-CM | POA: Diagnosis not present

## 2020-06-03 DIAGNOSIS — I25119 Atherosclerotic heart disease of native coronary artery with unspecified angina pectoris: Secondary | ICD-10-CM | POA: Diagnosis not present

## 2020-06-03 DIAGNOSIS — Z853 Personal history of malignant neoplasm of breast: Secondary | ICD-10-CM | POA: Diagnosis not present

## 2020-06-03 DIAGNOSIS — Z7982 Long term (current) use of aspirin: Secondary | ICD-10-CM | POA: Diagnosis not present

## 2020-06-03 DIAGNOSIS — M722 Plantar fascial fibromatosis: Secondary | ICD-10-CM | POA: Diagnosis not present

## 2020-06-03 DIAGNOSIS — M8580 Other specified disorders of bone density and structure, unspecified site: Secondary | ICD-10-CM | POA: Diagnosis not present

## 2020-06-03 DIAGNOSIS — E785 Hyperlipidemia, unspecified: Secondary | ICD-10-CM | POA: Diagnosis not present

## 2020-06-03 DIAGNOSIS — I10 Essential (primary) hypertension: Secondary | ICD-10-CM | POA: Diagnosis not present

## 2020-06-03 NOTE — Progress Notes (Signed)
Subjective:  Patient ID: Angela [name], female    DOB: October 07, 1945,  MRN: [id number]  Chief Complaint  Patient presents with  . Plantar Fasciitis    Right heel pain- pt states its been about 5-6 weeks of thispain-     74 y.o. female presents with the above complaint.  Patient presents with complaint of right heel pain that has been going for quite some time.  Patient states been on for 5 to 6 weeks has progressive gotten worse.  Patient has a history of plantar fasciitis.  Patient walks about a mile and keeps ice bag on because been hurting her a lot.  It has gotten much more painful after walking and after sitting for too long and taking the first.  It is constant ache.  She would like to discuss treatment options.  She is a diabetic with last A1c of 5.9   Review of Systems: Negative except as noted in the HPI. Denies N/V/F/Ch.  Past Medical History:  Diagnosis Date  . Arthritis   . BCC (basal cell carcinoma)   . Breast cancer ([place]) 2009   Left Breast Cancer  . CAD (coronary artery disease)   . Cataract   . Essential hypertension   . Hyperlipidemia   . Macular retinal cyst of right eye   . OSA (obstructive sleep apnea)    CPAP  . Osteopenia   . Personal history of radiation therapy     Current Outpatient Medications:  .  acetaminophen (TYLENOL) 500 MG tablet, Take 500 mg by mouth every 6 (six) hours as needed for moderate pain or headache., Disp: , Rfl:  .  aspirin 81 MG tablet, Take 81 mg by mouth every evening. , Disp: , Rfl:  .  Calcium Carbonate-Vitamin D (CALTRATE 600+D) 600-400 MG-UNIT per tablet, Take 2 tablets by mouth every evening. , Disp: , Rfl:  .  Cholecalciferol (VITAMIN D3) 50 MCG (2000 UT) capsule, Take 2,000 Units by mouth every evening. , Disp: , Rfl:  .  Coenzyme Q10 (COQ-10) 100 MG CAPS, Take 100 mg by mouth daily. , Disp: , Rfl:  .  denosumab (PROLIA) 60 MG/ML SOLN injection, Inject 60 mg into the skin every 6 (six) months. , Disp: , Rfl:  .  fish  oil-omega-3 fatty acids 1000 MG capsule, Take 1 g by mouth every evening. , Disp: , Rfl:  .  hydrochlorothiazide (HYDRODIURIL) 25 MG tablet, TAKE 1 TABLET BY MOUTH EVERY DAY, Disp: 90 tablet, Rfl: 1 .  isosorbide mononitrate (IMDUR) 30 MG 24 hr tablet, Take 30 mg by mouth daily., Disp: , Rfl:  .  metoprolol succinate (TOPROL-XL) 50 MG 24 hr tablet, TAKE 1 TABLET(50 MG) BY MOUTH EVERY EVENING, Disp: 30 tablet, Rfl: 3 .  Multiple Vitamin (MULTIVITAMIN) tablet, Take 1 tablet by mouth every evening. , Disp: , Rfl:  .  nitroGLYCERIN (NITROSTAT) 0.4 MG SL tablet, Place 1 tablet (0.4 mg total) under the tongue every 5 (five) minutes as needed for chest pain., Disp: 25 tablet, Rfl: 6 .  potassium chloride SA (KLOR-CON) 20 MEQ tablet, TAKE 1 TABLET BY MOUTH EVERY DAY, Disp: 90 tablet, Rfl: 1 .  rosuvastatin (CRESTOR) 40 MG tablet, Take 1 tablet (40 mg total) by mouth daily., Disp: 90 tablet, Rfl: 3 .  vitamin C (ASCORBIC ACID) 500 MG tablet, Take 500 mg by mouth every evening. , Disp: , Rfl:  .  ZINC-A-COLD MT, Use as directed in the mouth or throat. Cold Ease, Disp: , Rfl:  Social History   Tobacco Use  Smoking Status Never Smoker  Smokeless Tobacco Never Used    Allergies  Allergen Reactions  . Lisinopril Cough  . Statins Other (See Comments)    Myalgia with lipitor- unproven She is able to tolerate simvastatin   Objective:  There were no vitals filed for this visit. There is no height or weight on file to calculate BMI. Constitutional Well developed. Well nourished.  Vascular Dorsalis pedis pulses palpable bilaterally. Posterior tibial pulses palpable bilaterally. Capillary refill normal to all digits.  No cyanosis or clubbing noted. Pedal hair growth normal.  Neurologic Normal speech. Oriented to person, place, and time. Epicritic sensation to light touch grossly present bilaterally.  Dermatologic Nails well groomed and normal in appearance. No open wounds. No skin lesions.   Orthopedic: Normal joint ROM without pain or crepitus bilaterally. No visible deformities. Tender to palpation at the calcaneal tuber right. No pain with calcaneal squeeze right. Ankle ROM diminished range of motion right. Silfverskiold Test: positive right.   Radiographs: Taken and reviewed. No acute fractures or dislocations. No evidence of stress fracture.  Plantar heel spur absent. Posterior heel spur present.   Assessment:   1. Plantar fasciitis of right foot   2. Heel spur, right    Plan:  Patient was evaluated and treated and all questions answered.  Plantar Fasciitis, right - XR reviewed as above.  - Educated on icing and stretching. Instructions given.  - Injection delivered to the plantar fascia as below. - DME: Plantar Fascial Brace - Pharmacologic management: None  Procedure: Injection Tendon/Ligament Location: Right plantar fascia at the glabrous junction; medial approach. Skin Prep: alcohol Injectate: 0.5 cc 0.5% marcaine plain, 0.5 cc of 1% Lidocaine, 0.5 cc kenalog 10. Disposition: Patient tolerated procedure well. Injection site dressed with a band-aid.  No follow-ups on file.

## 2020-06-04 ENCOUNTER — Other Ambulatory Visit: Payer: Self-pay | Admitting: Cardiology

## 2020-06-08 ENCOUNTER — Ambulatory Visit: Payer: Medicare PPO | Admitting: Podiatry

## 2020-06-08 DIAGNOSIS — G4733 Obstructive sleep apnea (adult) (pediatric): Secondary | ICD-10-CM | POA: Diagnosis not present

## 2020-07-03 ENCOUNTER — Encounter: Payer: Self-pay | Admitting: Family Medicine

## 2020-07-04 ENCOUNTER — Telehealth (INDEPENDENT_AMBULATORY_CARE_PROVIDER_SITE_OTHER): Payer: Medicare PPO | Admitting: Family Medicine

## 2020-07-04 ENCOUNTER — Encounter: Payer: Self-pay | Admitting: Family Medicine

## 2020-07-04 ENCOUNTER — Other Ambulatory Visit: Payer: Self-pay | Admitting: Family Medicine

## 2020-07-04 VITALS — BP 132/69 | HR 69 | Ht 66.0 in | Wt 166.0 lb

## 2020-07-04 DIAGNOSIS — H35073 Retinal telangiectasis, bilateral: Secondary | ICD-10-CM | POA: Diagnosis not present

## 2020-07-04 DIAGNOSIS — N390 Urinary tract infection, site not specified: Secondary | ICD-10-CM

## 2020-07-04 DIAGNOSIS — H2512 Age-related nuclear cataract, left eye: Secondary | ICD-10-CM | POA: Diagnosis not present

## 2020-07-04 DIAGNOSIS — Z961 Presence of intraocular lens: Secondary | ICD-10-CM | POA: Diagnosis not present

## 2020-07-04 DIAGNOSIS — H33101 Unspecified retinoschisis, right eye: Secondary | ICD-10-CM | POA: Diagnosis not present

## 2020-07-04 DIAGNOSIS — H524 Presbyopia: Secondary | ICD-10-CM | POA: Diagnosis not present

## 2020-07-04 LAB — POCT URINALYSIS DIPSTICK
Bilirubin, UA: NEGATIVE
Blood, UA: NEGATIVE
Glucose, UA: NEGATIVE
Ketones, UA: NEGATIVE
Nitrite, UA: NEGATIVE
Protein, UA: NEGATIVE
Spec Grav, UA: 1.01 (ref 1.010–1.025)
Urobilinogen, UA: 0.2 E.U./dL
pH, UA: 6.5 (ref 5.0–8.0)

## 2020-07-04 MED ORDER — NITROFURANTOIN MONOHYD MACRO 100 MG PO CAPS: 100.0000 mg | ORAL_CAPSULE | Freq: Two times a day (BID) | ORAL | 0 refills | Status: DC

## 2020-07-04 NOTE — Progress Notes (Signed)
   Angela [name] is a 74 y.o. female who presents today for a virtual office visit.  Assessment/Plan:  New/Acute Problems: Dysuria History and UA consistent with UTI.  Will start [place].  Encouraged good oral hydration.  No signs of systemic illness.  Check urine culture. Discussed reasons to return to care.     Subjective:  HPI:  UTI symptoms about a week ago. Feels similar to previous UTIs. Symptoms include dysuria and foul smelling urine. Also with discolored urine. Has been drinking more water. Has tried AZO       Objective/Observations  Physical Exam: Gen: NAD, resting comfortably Pulm: Normal work of breathing Neuro: Grossly normal, moves all extremities Psych: Normal affect and thought content  Virtual Visit via Video   I connected with [name] on 07/04/20 at  3:40 PM EDT by a video enabled telemedicine application and verified that I am speaking with the correct person using two identifiers. The limitations of evaluation and management by telemedicine and the availability of in person appointments were discussed. The patient expressed understanding and agreed to proceed.   Patient location: Home Provider location: [place] participating in the virtual visit: Myself and Patient     Algis Greenhouse. Jerline Pain, MD 07/04/2020 2:13 PM

## 2020-07-06 ENCOUNTER — Encounter: Payer: Self-pay | Admitting: Podiatry

## 2020-07-06 ENCOUNTER — Other Ambulatory Visit: Payer: Self-pay

## 2020-07-06 ENCOUNTER — Ambulatory Visit: Payer: Medicare PPO | Admitting: Podiatry

## 2020-07-06 ENCOUNTER — Encounter: Payer: Self-pay | Admitting: Family Medicine

## 2020-07-06 DIAGNOSIS — L600 Ingrowing nail: Secondary | ICD-10-CM

## 2020-07-06 DIAGNOSIS — M722 Plantar fascial fibromatosis: Secondary | ICD-10-CM

## 2020-07-06 DIAGNOSIS — Q666 Other congenital valgus deformities of feet: Secondary | ICD-10-CM

## 2020-07-06 LAB — URINE CULTURE
MICRO NUMBER:: 11113521
SPECIMEN QUALITY:: ADEQUATE

## 2020-07-07 ENCOUNTER — Encounter: Payer: Self-pay | Admitting: Podiatry

## 2020-07-07 NOTE — Progress Notes (Signed)
Subjective:  Patient ID: Angela [name], female    DOB: 07/17/1946,  MRN: [id number]  Chief Complaint  Patient presents with  . Foot Pain    PT stated that she is doing much better but is still sore. Stated that she does ice her foot and feels like the injection did help. Also she would like to talk about her orthotics, Nail check on right hallux     74 y.o. female presents with the above complaint.  Patient presents with follow-up on right plantar fasciitis.  Patient states is getting better however the injection wore off after 3 weeks.  She would like to discuss future treatment options.  She has secondary complaint of right lateral ingrown nail border that has been hurting her a lot.  She would like to have that taken care of.  She says it hurts with ambulation.  She denies any other acute complaints.   Review of Systems: Negative except as noted in the HPI. Denies N/V/F/Ch.  Past Medical History:  Diagnosis Date  . Arthritis   . BCC (basal cell carcinoma)   . Breast cancer ([place]) 2009   Left Breast Cancer  . CAD (coronary artery disease)   . Cataract   . Essential hypertension   . Hyperlipidemia   . Macular retinal cyst of right eye   . OSA (obstructive sleep apnea)    CPAP  . Osteopenia   . Personal history of radiation therapy     Current Outpatient Medications:  .  acetaminophen (TYLENOL) 500 MG tablet, Take 500 mg by mouth every 6 (six) hours as needed for moderate pain or headache., Disp: , Rfl:  .  aspirin 81 MG tablet, Take 81 mg by mouth every evening. , Disp: , Rfl:  .  Calcium Carbonate-Vitamin D (CALTRATE 600+D) 600-400 MG-UNIT per tablet, Take 2 tablets by mouth every evening. , Disp: , Rfl:  .  Cholecalciferol (VITAMIN D3) 50 MCG (2000 UT) capsule, Take 2,000 Units by mouth every evening. , Disp: , Rfl:  .  Coenzyme Q10 (COQ-10) 100 MG CAPS, Take 100 mg by mouth daily. , Disp: , Rfl:  .  denosumab (PROLIA) 60 MG/ML SOLN injection, Inject 60 mg into the skin every  6 (six) months. , Disp: , Rfl:  .  fish oil-omega-3 fatty acids 1000 MG capsule, Take 1 g by mouth every evening. , Disp: , Rfl:  .  hydrochlorothiazide (HYDRODIURIL) 25 MG tablet, TAKE 1 TABLET BY MOUTH EVERY DAY, Disp: 90 tablet, Rfl: 1 .  isosorbide mononitrate (IMDUR) 30 MG 24 hr tablet, Take 30 mg by mouth daily., Disp: , Rfl:  .  metoprolol succinate (TOPROL-XL) 50 MG 24 hr tablet, TAKE 1 TABLET(50 MG) BY MOUTH EVERY EVENING, Disp: 30 tablet, Rfl: 3 .  Multiple Vitamin (MULTIVITAMIN) tablet, Take 1 tablet by mouth every evening. , Disp: , Rfl:  .  nitrofurantoin, macrocrystal-monohydrate, (MACROBID) 100 MG capsule, Take 1 capsule (100 mg total) by mouth 2 (two) times daily., Disp: 14 capsule, Rfl: 0 .  nitroGLYCERIN (NITROSTAT) 0.4 MG SL tablet, Place 1 tablet (0.4 mg total) under the tongue every 5 (five) minutes as needed for chest pain., Disp: 25 tablet, Rfl: 6 .  potassium chloride SA (KLOR-CON) 20 MEQ tablet, TAKE 1 TABLET BY MOUTH EVERY DAY, Disp: 90 tablet, Rfl: 1 .  rosuvastatin (CRESTOR) 40 MG tablet, Take 1 tablet (40 mg total) by mouth daily., Disp: 90 tablet, Rfl: 3 .  vitamin C (ASCORBIC ACID) 500 MG tablet, Take 500  mg by mouth every evening. , Disp: , Rfl:  .  ZINC-A-COLD MT, Use as directed in the mouth or throat. Cold Ease, Disp: , Rfl:   Social History   Tobacco Use  Smoking Status Never Smoker  Smokeless Tobacco Never Used    Allergies  Allergen Reactions  . Lisinopril Cough  . Statins Other (See Comments)    Myalgia with lipitor- unproven She is able to tolerate simvastatin   Objective:  There were no vitals filed for this visit. There is no height or weight on file to calculate BMI. Constitutional Well developed. Well nourished.  Vascular Dorsalis pedis pulses palpable bilaterally. Posterior tibial pulses palpable bilaterally. Capillary refill normal to all digits.  No cyanosis or clubbing noted. Pedal hair growth normal.  Neurologic Normal  speech. Oriented to person, place, and time. Epicritic sensation to light touch grossly present bilaterally.  Dermatologic Painful ingrowing nail at lateral nail borders of the hallux nail right. No open wounds. No skin lesions.  Orthopedic: Normal joint ROM without pain or crepitus bilaterally. No visible deformities. Tender to palpation at the calcaneal tuber right. No pain with calcaneal squeeze right. Ankle ROM diminished range of motion right. Silfverskiold Test: positive right.   Radiographs: Taken and reviewed. No acute fractures or dislocations. No evidence of stress fracture.  Plantar heel spur absent. Posterior heel spur present.   Assessment:   1. Plantar fasciitis of right foot   2. Ingrown toenail of right foot   3. Pes planovalgus    Plan:  Patient was evaluated and treated and all questions answered.  Plantar Fasciitis, right - XR reviewed as above.  - Educated on icing and stretching. Instructions given.  -Second injection injection delivered to the plantar fascia as below. - DME: Night splint - Pharmacologic management: None  Pes planovalgus -I explained patient the etiology of pes planovalgus and its relationship with plantar fasciitis.  Patient already has custom-made orthotics and seems to be functioning well in good condition alignment.  I have asked her to start wearing them immediately.  Patient states understanding  Procedure: Injection Tendon/Ligament Location: Right plantar fascia at the glabrous junction; medial approach. Skin Prep: alcohol Injectate: 0.5 cc 0.5% marcaine plain, 0.5 cc of 1% Lidocaine, 0.5 cc kenalog 10. Disposition: Patient tolerated procedure well. Injection site dressed with a band-aid.   Ingrown Nail, right -Patient elects to proceed with minor surgery to remove ingrown toenail removal today. Consent reviewed and signed by patient. -Ingrown nail excised. See procedure note. -Educated on post-procedure care including soaking.  Written instructions provided and reviewed. -Patient to follow up in 2 weeks for nail check.  Procedure: Excision of Ingrown Toenail Location: Right 1st toe lateral nail borders. Anesthesia: Lidocaine 1% plain; 1.5 mL and Marcaine 0.5% plain; 1.5 mL, digital block. Skin Prep: Betadine. Dressing: Silvadene; telfa; dry, sterile, compression dressing. Technique: Following skin prep, the toe was exsanguinated and a tourniquet was secured at the base of the toe. The affected nail border was freed, split with a nail splitter, and excised. Chemical matrixectomy was then performed with phenol and irrigated out with alcohol. The tourniquet was then removed and sterile dressing applied. Disposition: Patient tolerated procedure well. Patient to return in 2 weeks for follow-up.   No follow-ups on file.  No follow-ups on file.

## 2020-07-07 NOTE — Telephone Encounter (Signed)
Called and reviewed lab results with pt. 

## 2020-07-07 NOTE — Progress Notes (Signed)
Please inform patient of the following:  Culture confirms UTI.  The antibiotic we have her on should treat this.  Would like for her to let us know if symptoms are not improving.

## 2020-07-08 DIAGNOSIS — G4733 Obstructive sleep apnea (adult) (pediatric): Secondary | ICD-10-CM | POA: Diagnosis not present

## 2020-07-18 ENCOUNTER — Other Ambulatory Visit: Payer: Self-pay

## 2020-07-18 ENCOUNTER — Ambulatory Visit: Payer: Medicare PPO | Admitting: Dermatology

## 2020-07-18 DIAGNOSIS — L821 Other seborrheic keratosis: Secondary | ICD-10-CM | POA: Diagnosis not present

## 2020-07-18 DIAGNOSIS — L570 Actinic keratosis: Secondary | ICD-10-CM

## 2020-07-18 DIAGNOSIS — Z1283 Encounter for screening for malignant neoplasm of skin: Secondary | ICD-10-CM

## 2020-07-18 DIAGNOSIS — L851 Acquired keratosis [keratoderma] palmaris et plantaris: Secondary | ICD-10-CM

## 2020-07-18 DIAGNOSIS — L7211 Pilar cyst: Secondary | ICD-10-CM | POA: Diagnosis not present

## 2020-07-18 DIAGNOSIS — L720 Epidermal cyst: Secondary | ICD-10-CM

## 2020-07-18 NOTE — Patient Instructions (Signed)

## 2020-07-21 ENCOUNTER — Other Ambulatory Visit: Payer: Self-pay | Admitting: Family Medicine

## 2020-07-21 DIAGNOSIS — I10 Essential (primary) hypertension: Secondary | ICD-10-CM

## 2020-07-21 DIAGNOSIS — R079 Chest pain, unspecified: Secondary | ICD-10-CM

## 2020-07-28 ENCOUNTER — Other Ambulatory Visit (HOSPITAL_COMMUNITY): Payer: Self-pay | Admitting: Family Medicine

## 2020-07-28 ENCOUNTER — Ambulatory Visit: Payer: Medicare Other

## 2020-07-28 DIAGNOSIS — Z1231 Encounter for screening mammogram for malignant neoplasm of breast: Secondary | ICD-10-CM

## 2020-07-29 NOTE — Progress Notes (Signed)
   Follow-Up Visit   Subjective  Angela [name] is a 74 y.o. female who presents for the following: Annual Exam (Has spot on scalp she wants looked at may have photo to show you. ).  New growth left front scalp  The following portions of the chart were reviewed this encounter and updated as appropriate: Tobacco  Allergies  Meds  Problems  Med Hx  Surg Hx  Fam Hx      Review of Systems: No other skin or systemic complaints.  Objective  Well appearing patient in no apparent distress; mood and affect are within normal limits.  A full examination was performed including scalp, head, eyes, ears, nose, lips, neck, chest, axillae, abdomen, back, buttocks, bilateral upper extremities, bilateral lower extremities, hands, feet, fingers, toes, fingernails, and toenails. All findings within normal limits unless otherwise noted below.  Objective  Chest - Medial [facility]): Head to toe exam no signs of atypical moles, melanoma or non mole skin cancer.  Objective  Left Upper Arm - Anterior: Flat textured 7 mm tan papule  Objective  Mid Back: dozen 4 to 6 mm brown textured flattopped papules  Objective  Right Upper Cutaneous Lip: White hard 1 mm dermal papule  Objective  Left Parietal Scalp: Deep dermal to subcutaneous solitary mobile 8 mm papule  Objective  Right Root of Nose, Right Temporal Scalp: Pink 3 mm horny crust  Assessment & Plan  Skin exam for malignant neoplasm Chest - Medial (Center)  Yearly skin check  Stucco keratosis Left Upper Arm - Anterior  Benign okay to leave  Seborrheic keratosis Mid Back  Benign okay to leave  Milia Right Upper Cutaneous Lip  Benign okay to leave  Pilar cyst Left Parietal Scalp  Benign okay to leave unless it grows or starts bothering patient  Epidermal cyst Left Postauricular Area  Benign okay to leave unless it starts bothering patient  AK (actinic keratosis) (2) Right Root of Nose; Right Temporal  Scalp  Destruction of lesion - Right Root of Nose, Right Temporal Scalp Complexity: simple   Destruction method: cryotherapy   Informed consent: discussed and consent obtained   Timeout:  patient name, date of birth, surgical site, and procedure verified Lesion destroyed using liquid nitrogen: Yes   Cryotherapy cycles:  5 Outcome: patient tolerated procedure well with no complications   Post-procedure details: wound care instructions given    Return in about 3 months (around 10/18/2020) for Follow up.

## 2020-07-31 NOTE — Patient Instructions (Addendum)
Please stop by lab before you go If you have mychart- we will send your results within 3 business days of [place] receiving them.  If you do not have mychart- we will call you about results within 5 business days of [place] receiving them.  *please note we are currently using [facility] which has a longer processing time than [place] typically so labs may not come back as quickly as in the past *please also note that you will see labs on mychart as soon as they post. I will later go in and write notes on them- will say "notes from Dr. Yong Channel"   Glad you are doing well! Have a Happy Thanksgiving!

## 2020-07-31 NOTE — Progress Notes (Signed)
Phone [phone]   Subjective:  Patient presents today for their annual physical. Chief complaint-noted.   See problem oriented charting- ROS- full  review of systems was completed and negative except for: hearing loss, plantar fasciitis  The following were reviewed and entered/updated in epic: Past Medical History:  Diagnosis Date  . Arthritis   . BCC (basal cell carcinoma)   . Breast cancer ([place]) 2009   Left Breast Cancer  . CAD (coronary artery disease)   . Cataract   . Essential hypertension   . Hyperlipidemia   . Macular retinal cyst of right eye   . OSA (obstructive sleep apnea)    CPAP  . Osteopenia   . Personal history of radiation therapy    Patient Active Problem List   Diagnosis Date Noted  . CAD (coronary artery disease) 07/24/2019    Priority: High  . History of breast cancer 03/20/2011    Priority: High  . OSA (obstructive sleep apnea) 08/24/2015    Priority: Medium  . Hyperglycemia 11/12/2014    Priority: Medium  . Osteopenia determined by x-ray 06/04/2014    Priority: Medium  . HTN (hypertension) 07/13/2008    Priority: Medium  . Hyperlipidemia 07/09/2007    Priority: Medium  . History of skin cancer 02/21/2016    Priority: Low  . Chest discomfort 10/05/2014    Priority: Low  . Vitamin D deficiency 07/09/2007    Priority: Low  . 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 Type 2 macular telangiectasis, 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  . 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 Nuclear sclerotic cataract of left eye 03/08/2020  . Numbness and tingling of both feet 11/10/2019  . Neuropathy 11/10/2019  . History of colon polyps 06/12/2018   Past Surgical History:  Procedure Laterality Date  . BREAST EXCISIONAL BIOPSY Right   . BREAST LUMPECTOMY Left 06/2008  . CATARACT EXTRACTION  2012  . COLONOSCOPY    . Eyelid surgery Bilateral   . INTRAVASCULAR PRESSURE WIRE/FFR STUDY N/A 06/01/2019   Procedure: INTRAVASCULAR PRESSURE WIRE/FFR STUDY;  Surgeon: Belva Crome, MD;  Location: [facility];  Service: Cardiovascular;  Laterality: N/A;  . LEFT HEART CATH AND CORONARY ANGIOGRAPHY N/A 06/01/2019   Procedure: LEFT HEART CATH AND CORONARY ANGIOGRAPHY;  Surgeon: Belva Crome, MD;  Location: [facility];  Service: Cardiovascular;  Laterality: N/A;  . MASS EXCISION  03/2008   Appendix  . SKIN CANCER EXCISION    . TONSILLECTOMY      Family History  Problem Relation Age of Onset  . Heart attack Mother   . Hypertension Mother   . Heart disease Father   . Heart attack Father   . Hypertension Father   . Colon cancer Neg Hx   . Stroke Neg Hx   . Esophageal cancer Neg Hx   . Rectal cancer Neg Hx   . Stomach cancer Neg Hx     Medications- reviewed and updated Current Outpatient Medications  Medication Sig Dispense Refill  . acetaminophen (TYLENOL) 500 MG tablet Take 500 mg by mouth every 6 (six) hours as needed for moderate pain or headache.    Marland Kitchen aspirin 81 MG tablet Take 81 mg by mouth every evening.     . Calcium Carbonate-Vitamin D (CALTRATE 600+D) 600-400 MG-UNIT per tablet Take 2 tablets by mouth every evening.     . Cholecalciferol (VITAMIN D3) 50 MCG (2000 UT) capsule Take 2,000 Units by mouth every evening.     . Coenzyme Q10 (  COQ-10) 100 MG CAPS Take 100 mg by mouth daily.     Marland Kitchen denosumab (PROLIA) 60 MG/ML SOLN injection Inject 60 mg into the skin every 6 (six) months.     . fish oil-omega-3 fatty acids 1000 MG capsule Take 1 g by mouth every evening.     . hydrochlorothiazide (HYDRODIURIL) 25 MG tablet Take 1 tablet (25 mg total) by mouth daily. 90 tablet 3  . isosorbide mononitrate (IMDUR) 30 MG 24 hr tablet Take 30 mg by mouth daily.    . metoprolol succinate (TOPROL-XL) 50 MG 24 hr tablet Take with or immediately following a meal. 90 tablet 3  . Multiple Vitamin (MULTIVITAMIN) tablet Take 1 tablet by mouth every evening.     . nitroGLYCERIN (NITROSTAT) 0.4 MG SL tablet Place 1 tablet (0.4 mg total) under the tongue  every 5 (five) minutes as needed for chest pain. 25 tablet 6  . potassium chloride SA (KLOR-CON) 20 MEQ tablet TAKE 1 TABLET BY MOUTH EVERY DAY 90 tablet 1  . rosuvastatin (CRESTOR) 40 MG tablet Take 1 tablet (40 mg total) by mouth daily. 90 tablet 3  . vitamin C (ASCORBIC ACID) 500 MG tablet Take 500 mg by mouth every evening.     Marland Kitchen ZINC-A-COLD MT Use as directed in the mouth or throat. Cold Ease     No current facility-administered medications for this visit.    Allergies-reviewed and updated Allergies  Allergen Reactions  . Lisinopril Cough  . Statins Other (See Comments)    Myalgia with lipitor- unproven She is able to tolerate simvastatin    Social History   Social History Narrative   Married. 2 daughters. 4 grandkids (teenagers in [place] and then two 3 and under in 2021)      Retired from teaching special ed mostly [place]: travel, time with friends, eating out, lots of grandkids time in [place]   Objective  Objective:  BP 128/64   Pulse (!) 56   Temp 97.8 F (36.6 C) (Temporal)   Resp 18   Ht 5\' 6"  (1.676 m)   Wt 165 lb 12.8 oz (75.2 kg)   LMP  (LMP Unknown)   SpO2 95%   BMI 26.76 kg/m  Gen: NAD, resting comfortably HEENT: Mucous membranes are moist. Oropharynx normal Neck: no thyromegaly CV: RRR no murmurs rubs or gallops Lungs: CTAB no crackles, wheeze, rhonchi Abdomen: soft/nontender/nondistended/normal bowel sounds. No rebound or guarding.  Ext: no edema Skin: warm, dry Neuro: grossly normal, moves all extremities, PERRLA   Assessment and Plan   74 y.o. female presenting for annual physical.  Health Maintenance counseling: 1. Anticipatory guidance: Patient counseled regarding regular dental exams q6 months, eye exams- yearly,  avoiding smoking and second hand smoke limiting alcohol to 1 beverage per day.   2. Risk factor reduction:  Advised patient of need for regular exercise and diet rich and fruits and vegetables to reduce risk of heart  attack and stroke. Exercise- very limited by plantar fasciitis see discussion below. Diet- cooks at home/eating reasonably healthy diet. Weight 3 lbs from last physical despite setbacks on exercise.  Wt Readings from Last 3 Encounters:  08/01/20 165 lb 12.8 oz (75.2 kg)  07/04/20 166 lb (75.3 kg)  04/20/20 168 lb 6.4 oz (76.4 kg)  3. Immunizations/screenings/ancillary studies- already had flu shot and covid booster Immunization History  Administered Date(s) Administered  . Fluad Quad(high Dose 65+) 06/12/2019, 05/05/2020  . Hep A / Hep B 03/16/2013,  10/06/2013  . Hepatitis B, adult 04/17/2013  . Influenza Split 06/09/2012  . Influenza Whole 05/22/2011  . Influenza,inj,Quad PF,6+ Mos 05/27/2013  . Influenza-Unspecified 06/10/2014, 05/24/2015, 04/24/2016, 04/26/2017, 05/11/2018  . Moderna SARS-COVID-2 Vaccination 10/13/2019, 11/12/2019, 07/05/2020  . Pneumococcal Conjugate-13 11/12/2014  . Pneumococcal Polysaccharide-23 08/24/2008, 07/17/2013  . Td 04/19/2010  . Tdap 04/05/2016  . Zoster 11/17/2008  . Zoster Recombinat (Shingrix) 07/01/2017, 09/20/2017   4. Cervical cancer screening- past age based screening recommendations 5. Breast cancer screening-  See below 6. Colon cancer screening - 06/05/2018 - will be due September 2022 with history precancerous polyps 7. Skin cancer screening- dermatology yearly with Dr. Denna Haggard. advised regular sunscreen use. Denies worrisome, changing, or new skin lesions.  8. Birth control/STD check- postmenopausal/ only active with husband 7. Osteoporosis screening at 52- see below -Never smoker  Status of chronic or acute concerns   #plantar fasciitis- has been off waling for 2 months. Several visits with podiatry and feeling better- wondering if she can restart. May go to [facility] and use ike.   #History of breast cancer-status post lumpectomy and radiation therapy in 2009.  Completed Arimidex December 2014 after initially starting on tamoxifen.  Continues  yearly mammograms. Declines breast exam today- got moved back 6 weeks due to booster   #Coronary artery disease of native artery of native heart with stable angina pectoris (HCC) #hyperlipidemia S: Medication: aspirin 81 mg, imdur 30mg , nitroglycerin prn, rosuvastatin 40 mg  Last LDL right at 70 now on rosuvastatin 40 mg daily  A/P: CAD asymptomatic- continue current meds. Hoping direct LDL 70 or less- if not consider zetia but also watch LFTs.   #hypertension S: medication:  hctz 25mg  (also on potassium), imdur 30mg , metoprolol 50mg  XR Home readings #s:  Reasonably controlled- 120-130/70-80- higher with coffee A/P: Stable. Continue current medications.   # Hyperglycemia/insulin resistance/prediabetes- peak a1c of 6.0 July 2019 S:  Medication: none Lab Results  Component Value Date   HGBA1C 5.9 01/26/2020  Exercise and diet- exercise has been down, trying to eat healthy- thankfully don slightly on weight from last cpe  A/P: hopefully stable- update levels today  #Vitamin D deficiency/osteoporosis- on prolia starting 2015 S: Medication: vitamin D 1000 units daily , also on calcium Last vitamin D Lab Results  Component Value Date   VD25OH 44.12 07/24/2019  A/P: check vitamin D levels today. For osteoporosis sees oncologist at [facility] in January and may ask. We are at present planning on 10 years prolia or at year 9 referring to endocrine for their thoughts on next steps (that would be 2024 or 2015.    Recommended follow up: Return in about 6 months (around 01/29/2021) for follow up- or sooner if needed. Future Appointments  Date Time Provider [facility]  08/10/2020  2:15 PM Felipa Furnace, DPM [facility]  08/24/2020 10:00 AM AP-MM 1 AP-MM [place] [facility]  09/14/2020 10:40 AM [facility] LAB [facility] None  09/14/2020 11:45 AM Derek Jack, MD [facility] None  09/14/2020 12:00 PM [facility] INJ NURSE [facility] None  10/06/2020  1:45 PM Rankin, Clent Demark, MD [facility] None   10/18/2020  9:30 AM [name], MD [facility]  07/18/2021  9:30 AM [name], MD [facility]   Lab/Order associations: fasting   ICD-10-CM   1. Preventative health care  Z00.00 CBC with Differential/Platelet    LDL cholesterol, direct    Hemoglobin A1c    VITAMIN D 25 Hydroxy (Vit-D Deficiency, Fractures)    COMPLETE METABOLIC PANEL WITH GFR  2. Primary hypertension  I10 CBC with Differential/Platelet    LDL cholesterol, direct    COMPLETE METABOLIC PANEL WITH GFR  3. Hyperglycemia  R73.9 Hemoglobin A1c  4. Hyperlipidemia, unspecified hyperlipidemia type  E78.5 LDL cholesterol, direct  5. Vitamin D deficiency  E55.9 VITAMIN D 25 Hydroxy (Vit-D Deficiency, Fractures)  6. History of breast cancer  Z85.3   7. Coronary artery disease of native artery of native heart with stable angina pectoris ([place])  I25.118    Meds ordered this encounter  Medications  . metoprolol succinate (TOPROL-XL) 50 MG 24 hr tablet    Sig: Take with or immediately following a meal.    Dispense:  90 tablet    Refill:  3  . hydrochlorothiazide (HYDRODIURIL) 25 MG tablet    Sig: Take 1 tablet (25 mg total) by mouth daily.    Dispense:  90 tablet    Refill:  3   Return precautions advised.  Garret Reddish, MD

## 2020-08-01 ENCOUNTER — Other Ambulatory Visit: Payer: Self-pay

## 2020-08-01 ENCOUNTER — Encounter: Payer: Self-pay | Admitting: Dermatology

## 2020-08-01 ENCOUNTER — Ambulatory Visit: Payer: Medicare PPO

## 2020-08-01 ENCOUNTER — Ambulatory Visit (INDEPENDENT_AMBULATORY_CARE_PROVIDER_SITE_OTHER): Payer: Medicare PPO | Admitting: Family Medicine

## 2020-08-01 ENCOUNTER — Encounter: Payer: Self-pay | Admitting: Family Medicine

## 2020-08-01 VITALS — BP 128/64 | HR 56 | Temp 97.8°F | Resp 18 | Ht 66.0 in | Wt 165.8 lb

## 2020-08-01 DIAGNOSIS — I25118 Atherosclerotic heart disease of native coronary artery with other forms of angina pectoris: Secondary | ICD-10-CM | POA: Diagnosis not present

## 2020-08-01 DIAGNOSIS — R739 Hyperglycemia, unspecified: Secondary | ICD-10-CM

## 2020-08-01 DIAGNOSIS — E785 Hyperlipidemia, unspecified: Secondary | ICD-10-CM | POA: Diagnosis not present

## 2020-08-01 DIAGNOSIS — E559 Vitamin D deficiency, unspecified: Secondary | ICD-10-CM

## 2020-08-01 DIAGNOSIS — Z0000 Encounter for general adult medical examination without abnormal findings: Secondary | ICD-10-CM

## 2020-08-01 DIAGNOSIS — Z853 Personal history of malignant neoplasm of breast: Secondary | ICD-10-CM | POA: Diagnosis not present

## 2020-08-01 DIAGNOSIS — I10 Essential (primary) hypertension: Secondary | ICD-10-CM | POA: Diagnosis not present

## 2020-08-01 MED ORDER — HYDROCHLOROTHIAZIDE 25 MG PO TABS
25.0000 mg | ORAL_TABLET | Freq: Every day | ORAL | 3 refills | Status: DC
Start: 2020-08-01 — End: 2021-08-24

## 2020-08-01 MED ORDER — METOPROLOL SUCCINATE ER 50 MG PO TB24
ORAL_TABLET | ORAL | 3 refills | Status: DC
Start: 2020-08-01 — End: 2021-08-24

## 2020-08-01 NOTE — Progress Notes (Signed)
I, [name], MD, have reviewed all documentation for this visit. The documentation on 08/01/20 for the exam, diagnosis, procedures, and orders are all accurate and complete.

## 2020-08-02 LAB — COMPLETE METABOLIC PANEL WITH GFR
AG Ratio: 2.1 (calc) (ref 1.0–2.5)
ALT: 34 U/L — ABNORMAL HIGH (ref 6–29)
AST: 30 U/L (ref 10–35)
Albumin: 4.7 g/dL (ref 3.6–5.1)
Alkaline phosphatase (APISO): 53 U/L (ref 37–153)
BUN: 14 mg/dL (ref 7–25)
CO2: 28 mmol/L (ref 20–32)
Calcium: 10.2 mg/dL (ref 8.6–10.4)
Chloride: 103 mmol/L (ref 98–110)
Creat: 0.68 mg/dL (ref 0.60–0.93)
GFR, Est African American: 101 mL/min/{1.73_m2} (ref >=60)
GFR, Est Non African American: 87 mL/min/{1.73_m2} (ref >=60)
Globulin: 2.2 g/dL (calc) (ref 1.9–3.7)
Glucose, Bld: 93 mg/dL (ref 65–99)
Potassium: 4.7 mmol/L (ref 3.5–5.3)
Sodium: 141 mmol/L (ref 135–146)
Total Bilirubin: 0.7 mg/dL (ref 0.2–1.2)
Total Protein: 6.9 g/dL (ref 6.1–8.1)

## 2020-08-02 LAB — HEMOGLOBIN A1C
Hgb A1c MFr Bld: 5.9 % of total Hgb — ABNORMAL HIGH (ref <5.7)
Mean Plasma Glucose: 123 (calc)
eAG (mmol/L): 6.8 (calc)

## 2020-08-02 LAB — CBC WITH DIFFERENTIAL/PLATELET
Absolute Monocytes: 649 cells/uL (ref 200–950)
Basophils Absolute: 32 cells/uL (ref 0–200)
Basophils Relative: 0.5 %
Eosinophils Absolute: 158 cells/uL (ref 15–500)
Eosinophils Relative: 2.5 %
HCT: 43.7 % (ref 35.0–45.0)
Hemoglobin: 14.6 g/dL (ref 11.7–15.5)
Lymphs Abs: 2161 cells/uL (ref 850–3900)
MCH: 31.7 pg (ref 27.0–33.0)
MCHC: 33.4 g/dL (ref 32.0–36.0)
MCV: 94.8 fL (ref 80.0–100.0)
MPV: 10.7 fL (ref 7.5–12.5)
Monocytes Relative: 10.3 %
Neutro Abs: 3301 cells/uL (ref 1500–7800)
Neutrophils Relative %: 52.4 %
Platelets: 214 10*3/uL (ref 140–400)
RBC: 4.61 10*6/uL (ref 3.80–5.10)
RDW: 11.8 % (ref 11.0–15.0)
Total Lymphocyte: 34.3 %
WBC: 6.3 10*3/uL (ref 3.8–10.8)

## 2020-08-02 LAB — VITAMIN D 25 HYDROXY (VIT D DEFICIENCY, FRACTURES): Vit D, 25-Hydroxy: 40 ng/mL (ref 30–100)

## 2020-08-02 LAB — LDL CHOLESTEROL, DIRECT: Direct LDL: 67 mg/dL (ref <100)

## 2020-08-08 DIAGNOSIS — G4733 Obstructive sleep apnea (adult) (pediatric): Secondary | ICD-10-CM | POA: Diagnosis not present

## 2020-08-10 ENCOUNTER — Other Ambulatory Visit: Payer: Self-pay

## 2020-08-10 ENCOUNTER — Ambulatory Visit: Payer: Medicare PPO | Admitting: Podiatry

## 2020-08-10 DIAGNOSIS — M722 Plantar fascial fibromatosis: Secondary | ICD-10-CM | POA: Diagnosis not present

## 2020-08-11 ENCOUNTER — Encounter: Payer: Self-pay | Admitting: Podiatry

## 2020-08-11 NOTE — Progress Notes (Signed)
Subjective:  Patient ID: Angela [name], female    DOB: 1945/11/06,  MRN: [id number]  Chief Complaint  Patient presents with  . Plantar Fasciitis    pt states pain with plantar fasciitis has improved but still has pain   . Ingrown Toenail    pt states right ingrown toenail has improved but has random pain    74 y.o. female presents with the above complaint.  Patient presents with follow-up on right plantar fasciitis.  P patient states that she is doing a lot better.  She states the injection helped considerably.  Her ingrown has also improved a lot to.  She has been wearing her bracing which helped considerably.  At this time she would like to hold off on any further injection.  She is about 90% improved.   Review of Systems: Negative except as noted in the HPI. Denies N/V/F/Ch.  Past Medical History:  Diagnosis Date  . Arthritis   . BCC (basal cell carcinoma)   . Breast cancer ([place]) 2009   Left Breast Cancer  . CAD (coronary artery disease)   . Cataract   . Essential hypertension   . Hyperlipidemia   . Macular retinal cyst of right eye   . OSA (obstructive sleep apnea)    CPAP  . Osteopenia   . Personal history of radiation therapy     Current Outpatient Medications:  .  acetaminophen (TYLENOL) 500 MG tablet, Take 500 mg by mouth every 6 (six) hours as needed for moderate pain or headache., Disp: , Rfl:  .  aspirin 81 MG tablet, Take 81 mg by mouth every evening. , Disp: , Rfl:  .  Calcium Carbonate-Vitamin D (CALTRATE 600+D) 600-400 MG-UNIT per tablet, Take 2 tablets by mouth every evening. , Disp: , Rfl:  .  Cholecalciferol (VITAMIN D3) 50 MCG (2000 UT) capsule, Take 2,000 Units by mouth every evening. , Disp: , Rfl:  .  Coenzyme Q10 (COQ-10) 100 MG CAPS, Take 100 mg by mouth daily. , Disp: , Rfl:  .  denosumab (PROLIA) 60 MG/ML SOLN injection, Inject 60 mg into the skin every 6 (six) months. , Disp: , Rfl:  .  fish oil-omega-3 fatty acids 1000 MG capsule, Take 1 g by mouth  every evening. , Disp: , Rfl:  .  hydrochlorothiazide (HYDRODIURIL) 25 MG tablet, Take 1 tablet (25 mg total) by mouth daily., Disp: 90 tablet, Rfl: 3 .  isosorbide mononitrate (IMDUR) 30 MG 24 hr tablet, Take 30 mg by mouth daily., Disp: , Rfl:  .  metoprolol succinate (TOPROL-XL) 50 MG 24 hr tablet, Take with or immediately following a meal., Disp: 90 tablet, Rfl: 3 .  Multiple Vitamin (MULTIVITAMIN) tablet, Take 1 tablet by mouth every evening. , Disp: , Rfl:  .  nitroGLYCERIN (NITROSTAT) 0.4 MG SL tablet, Place 1 tablet (0.4 mg total) under the tongue every 5 (five) minutes as needed for chest pain., Disp: 25 tablet, Rfl: 6 .  potassium chloride SA (KLOR-CON) 20 MEQ tablet, TAKE 1 TABLET BY MOUTH EVERY DAY, Disp: 90 tablet, Rfl: 1 .  rosuvastatin (CRESTOR) 40 MG tablet, Take 1 tablet (40 mg total) by mouth daily., Disp: 90 tablet, Rfl: 3 .  vitamin C (ASCORBIC ACID) 500 MG tablet, Take 500 mg by mouth every evening. , Disp: , Rfl:  .  ZINC-A-COLD MT, Use as directed in the mouth or throat. Cold Ease, Disp: , Rfl:   Social History   Tobacco Use  Smoking Status Never Smoker  Smokeless  Tobacco Never Used    Allergies  Allergen Reactions  . Lisinopril Cough  . Statins Other (See Comments)    Myalgia with lipitor- unproven She is able to tolerate simvastatin   Objective:  There were no vitals filed for this visit. There is no height or weight on file to calculate BMI. Constitutional Well developed. Well nourished.  Vascular Dorsalis pedis pulses palpable bilaterally. Posterior tibial pulses palpable bilaterally. Capillary refill normal to all digits.  No cyanosis or clubbing noted. Pedal hair growth normal.  Neurologic Normal speech. Oriented to person, place, and time. Epicritic sensation to light touch grossly present bilaterally.  Dermatologic Painful ingrowing nail at lateral nail borders of the hallux nail right. No open wounds. No skin lesions.  Orthopedic: Normal joint  ROM without pain or crepitus bilaterally. No visible deformities. Mild tender to palpation at the calcaneal tuber right. No pain with calcaneal squeeze right. Ankle ROM diminished range of motion right. Silfverskiold Test: positive right.   Radiographs: Taken and reviewed. No acute fractures or dislocations. No evidence of stress fracture.  Plantar heel spur absent. Posterior heel spur present.   Assessment:   No diagnosis found. Plan:  Patient was evaluated and treated and all questions answered.  Plantar Fasciitis, right - XR reviewed as above.  - Educated on icing and stretching. Instructions given.  -Second injection injection delivered to the plantar fascia as below. - DME: Continue bracing it with night splint and plantar fascial brace - Pharmacologic management: None -If there is still residual pain when I see her back in 8 weeks we will plan on a cam boot immobilization.  Pes planovalgus -I explained patient the etiology of pes planovalgus and its relationship with plantar fasciitis.  Patient already has custom-made orthotics and seems to be functioning well in good condition alignment.  I have asked her to start wearing them immediately.  Patient states understanding    Ingrown Nail, right -Clinically healed.  No follow-ups on file.  No follow-ups on file.

## 2020-08-12 DIAGNOSIS — Z20822 Contact with and (suspected) exposure to covid-19: Secondary | ICD-10-CM | POA: Diagnosis not present

## 2020-08-24 ENCOUNTER — Ambulatory Visit (HOSPITAL_COMMUNITY)
Admission: RE | Admit: 2020-08-24 | Discharge: 2020-08-24 | Disposition: A | Discharge status: Home / Self Care | Payer: Medicare PPO | Source: Ambulatory Visit | Attending: Family Medicine | Admitting: Family Medicine

## 2020-08-24 ENCOUNTER — Other Ambulatory Visit: Payer: Self-pay

## 2020-08-24 DIAGNOSIS — Z1231 Encounter for screening mammogram for malignant neoplasm of breast: Secondary | ICD-10-CM | POA: Diagnosis not present

## 2020-08-29 ENCOUNTER — Ambulatory Visit (HOSPITAL_COMMUNITY): Payer: Medicare Other

## 2020-08-29 ENCOUNTER — Other Ambulatory Visit (HOSPITAL_COMMUNITY): Payer: Medicare Other

## 2020-09-05 ENCOUNTER — Ambulatory Visit (HOSPITAL_COMMUNITY): Payer: Medicare Other | Admitting: Hematology

## 2020-09-05 ENCOUNTER — Ambulatory Visit (HOSPITAL_COMMUNITY): Payer: Medicare Other

## 2020-09-05 ENCOUNTER — Other Ambulatory Visit (HOSPITAL_COMMUNITY): Payer: Medicare Other

## 2020-09-06 ENCOUNTER — Encounter (HOSPITAL_COMMUNITY): Payer: Self-pay

## 2020-09-06 ENCOUNTER — Encounter: Payer: Self-pay | Admitting: Family Medicine

## 2020-09-08 ENCOUNTER — Encounter (INDEPENDENT_AMBULATORY_CARE_PROVIDER_SITE_OTHER): Payer: Medicare PPO | Admitting: Ophthalmology

## 2020-09-08 ENCOUNTER — Ambulatory Visit: Payer: Medicare PPO

## 2020-09-12 DIAGNOSIS — Z20822 Contact with and (suspected) exposure to covid-19: Secondary | ICD-10-CM | POA: Diagnosis not present

## 2020-09-14 ENCOUNTER — Other Ambulatory Visit (HOSPITAL_COMMUNITY): Payer: Self-pay | Admitting: *Deleted

## 2020-09-14 ENCOUNTER — Other Ambulatory Visit (HOSPITAL_COMMUNITY): Payer: Medicare PPO

## 2020-09-14 ENCOUNTER — Encounter: Payer: Self-pay | Admitting: Family Medicine

## 2020-09-14 ENCOUNTER — Ambulatory Visit (HOSPITAL_COMMUNITY): Payer: Self-pay | Admitting: Hematology

## 2020-09-14 ENCOUNTER — Ambulatory Visit (HOSPITAL_COMMUNITY): Payer: Self-pay

## 2020-09-14 DIAGNOSIS — M8580 Other specified disorders of bone density and structure, unspecified site: Secondary | ICD-10-CM

## 2020-09-14 DIAGNOSIS — Z170 Estrogen receptor positive status [ER+]: Secondary | ICD-10-CM

## 2020-09-14 DIAGNOSIS — C50912 Malignant neoplasm of unspecified site of left female breast: Secondary | ICD-10-CM

## 2020-09-15 ENCOUNTER — Inpatient Hospital Stay (HOSPITAL_COMMUNITY): Payer: Medicare PPO | Attending: Hematology

## 2020-09-15 ENCOUNTER — Inpatient Hospital Stay (HOSPITAL_COMMUNITY): Payer: Medicare PPO | Admitting: Hematology

## 2020-09-15 ENCOUNTER — Other Ambulatory Visit: Payer: Self-pay

## 2020-09-15 ENCOUNTER — Inpatient Hospital Stay (HOSPITAL_COMMUNITY): Payer: Medicare PPO

## 2020-09-15 ENCOUNTER — Other Ambulatory Visit (HOSPITAL_COMMUNITY): Payer: Self-pay | Admitting: Hematology

## 2020-09-15 VITALS — BP 124/68 | HR 58 | Temp 97.9°F | Resp 16 | Wt 170.0 lb

## 2020-09-15 DIAGNOSIS — R5383 Other fatigue: Secondary | ICD-10-CM | POA: Diagnosis not present

## 2020-09-15 DIAGNOSIS — Z170 Estrogen receptor positive status [ER+]: Secondary | ICD-10-CM

## 2020-09-15 DIAGNOSIS — Z853 Personal history of malignant neoplasm of breast: Secondary | ICD-10-CM

## 2020-09-15 DIAGNOSIS — Z923 Personal history of irradiation: Secondary | ICD-10-CM | POA: Diagnosis not present

## 2020-09-15 DIAGNOSIS — M8580 Other specified disorders of bone density and structure, unspecified site: Secondary | ICD-10-CM

## 2020-09-15 DIAGNOSIS — Z9223 Personal history of estrogen therapy: Secondary | ICD-10-CM | POA: Insufficient documentation

## 2020-09-15 DIAGNOSIS — Z7982 Long term (current) use of aspirin: Secondary | ICD-10-CM | POA: Insufficient documentation

## 2020-09-15 DIAGNOSIS — C50412 Malignant neoplasm of upper-outer quadrant of left female breast: Secondary | ICD-10-CM | POA: Diagnosis not present

## 2020-09-15 DIAGNOSIS — Z79899 Other long term (current) drug therapy: Secondary | ICD-10-CM | POA: Insufficient documentation

## 2020-09-15 DIAGNOSIS — C50912 Malignant neoplasm of unspecified site of left female breast: Secondary | ICD-10-CM

## 2020-09-15 DIAGNOSIS — Z1231 Encounter for screening mammogram for malignant neoplasm of breast: Secondary | ICD-10-CM

## 2020-09-15 LAB — CBC WITH DIFFERENTIAL/PLATELET
Abs Immature Granulocytes: 0.02 10*3/uL (ref 0.00–0.07)
Basophils Absolute: 0 10*3/uL (ref 0.0–0.1)
Basophils Relative: 0 %
Eosinophils Absolute: 0.2 10*3/uL (ref 0.0–0.5)
Eosinophils Relative: 2 %
HCT: 41.3 % (ref 36.0–46.0)
Hemoglobin: 13.7 g/dL (ref 12.0–15.0)
Immature Granulocytes: 0 %
Lymphocytes Relative: 28 %
Lymphs Abs: 1.9 10*3/uL (ref 0.7–4.0)
MCH: 32.5 pg (ref 26.0–34.0)
MCHC: 33.2 g/dL (ref 30.0–36.0)
MCV: 98.1 fL (ref 80.0–100.0)
Monocytes Absolute: 0.7 10*3/uL (ref 0.1–1.0)
Monocytes Relative: 10 %
Neutro Abs: 4 10*3/uL (ref 1.7–7.7)
Neutrophils Relative %: 60 %
Platelets: 222 10*3/uL (ref 150–400)
RBC: 4.21 MIL/uL (ref 3.87–5.11)
RDW: 11.8 % (ref 11.5–15.5)
WBC: 6.8 10*3/uL (ref 4.0–10.5)
nRBC: 0 % (ref 0.0–0.2)

## 2020-09-15 LAB — COMPREHENSIVE METABOLIC PANEL
ALT: 30 U/L (ref 0–44)
AST: 28 U/L (ref 15–41)
Albumin: 4.2 g/dL (ref 3.5–5.0)
Alkaline Phosphatase: 55 U/L (ref 38–126)
Anion gap: 6 (ref 5–15)
BUN: 21 mg/dL (ref 8–23)
CO2: 29 mmol/L (ref 22–32)
Calcium: 9.8 mg/dL (ref 8.9–10.3)
Chloride: 101 mmol/L (ref 98–111)
Creatinine, Ser: 0.7 mg/dL (ref 0.44–1.00)
GFR, Estimated: >60 mL/min (ref >60)
Glucose, Bld: 106 mg/dL — ABNORMAL HIGH (ref 70–99)
Potassium: 4.6 mmol/L (ref 3.5–5.1)
Sodium: 136 mmol/L (ref 135–145)
Total Bilirubin: 0.7 mg/dL (ref 0.3–1.2)
Total Protein: 7 g/dL (ref 6.5–8.1)

## 2020-09-15 MED ORDER — DENOSUMAB 60 MG/ML ~~LOC~~ SOSY
60.0000 mg | PREFILLED_SYRINGE | Freq: Once | SUBCUTANEOUS | Status: AC
  Administered 2020-09-15: 60 mg via SUBCUTANEOUS
  Filled 2020-09-15: qty 1

## 2020-09-15 NOTE — Progress Notes (Signed)
..  Angela [name] presents today for injection per the provider's orders.  Prolia 60 mg administration without incident; injection site WNL; see MAR for injection details. Pt reports taking Calcium and Vit. D as directed. Pt has no recent or future dental appointments at this time.  Patient tolerated procedure well and without incident.  No questions or complaints noted at this time. Pt discharged ambulatory in satisfactory condition.

## 2020-09-15 NOTE — Progress Notes (Signed)
[name] [address], [place] [address]   Patient Care Team: Marin Olp, MD as PCP - General (Family Medicine) Satira Sark, MD as PCP - Cardiology (Cardiology) Jovita Kussmaul, MD as Consulting Physician (General Surgery) Thea Silversmith, MD as Consulting Physician (Radiation Oncology) Inda Castle, MD (Inactive) as Consulting Physician (Gastroenterology) Danice Goltz, MD as Consulting Physician (Ophthalmology) Zadie Rhine Clent Demark, MD as Consulting Physician (Ophthalmology) Chesley Mires, MD as Consulting Physician (Pulmonary Disease) Derek Jack, MD as Consulting Physician (Hematology) Gardiner Barefoot, DPM as Consulting Physician (Podiatry) [name], MD as Consulting Physician (Dermatology)  SUMMARY OF ONCOLOGIC HISTORY: Oncology History   No history exists.    CHIEF COMPLIANT: Follow-up for left breast cancer   INTERVAL HISTORY: [name] is a 75 y.o. female here today for follow up of her left breast cancer. Her last visit was on 08/29/2018.   Today she reports feeling okay. Her appetite is excellent and her energy levels are just above 50%. She is tolerating the Prolia injections well and denies having any new bone pains. She continues taking calcium and vitamin D daily. She denies having any history of broken bones.  She has received her COVID vaccines and booster.   REVIEW OF SYSTEMS:   Review of Systems  Constitutional: Positive for fatigue (50%). Negative for appetite change.  All other systems reviewed and are negative.   I have reviewed the past medical history, past surgical history, social history and family history with the patient and they are unchanged from previous note.   ALLERGIES:   is allergic to lisinopril and statins.   MEDICATIONS:  Current Outpatient Medications  Medication Sig Dispense Refill  . aspirin 81 MG tablet Take 81 mg by mouth every evening.    . Calcium  Carbonate-Vitamin D 600-400 MG-UNIT tablet Take 2 tablets by mouth every evening.     . Cholecalciferol (VITAMIN D3) 50 MCG (2000 UT) capsule Take 2,000 Units by mouth every evening.     . Coenzyme Q10 (COQ-10) 100 MG CAPS Take 100 mg by mouth daily.     Marland Kitchen denosumab (PROLIA) 60 MG/ML SOLN injection Inject 60 mg into the skin every 6 (six) months.     . fish oil-omega-3 fatty acids 1000 MG capsule Take 1 g by mouth every evening.     . hydrochlorothiazide (HYDRODIURIL) 25 MG tablet Take 1 tablet (25 mg total) by mouth daily. 90 tablet 3  . isosorbide mononitrate (IMDUR) 30 MG 24 hr tablet Take 30 mg by mouth daily.    . metoprolol succinate (TOPROL-XL) 50 MG 24 hr tablet Take with or immediately following a meal. 90 tablet 3  . Multiple Vitamin (MULTIVITAMIN) tablet Take 1 tablet by mouth every evening.    . nitroGLYCERIN (NITROSTAT) 0.4 MG SL tablet Place 1 tablet (0.4 mg total) under the tongue every 5 (five) minutes as needed for chest pain. 25 tablet 6  . potassium chloride SA (KLOR-CON) 20 MEQ tablet TAKE 1 TABLET BY MOUTH EVERY DAY 90 tablet 1  . rosuvastatin (CRESTOR) 40 MG tablet Take 1 tablet (40 mg total) by mouth daily. 90 tablet 3  . vitamin C (ASCORBIC ACID) 500 MG tablet Take 500 mg by mouth every evening.     Marland Kitchen ZINC-A-COLD MT Use as directed in the mouth or throat. Cold Ease    . acetaminophen (TYLENOL) 500 MG tablet Take 500 mg by mouth every 6 (six) hours as needed for moderate pain  or headache. (Patient not taking: Reported on 09/15/2020)     No current facility-administered medications for this visit.     PHYSICAL EXAMINATION: Performance status (ECOG): 1 - Symptomatic but completely ambulatory  Vitals:   09/15/20 1311  BP: 124/68  Pulse: (!) 58  Resp: 16  Temp: 97.9 F (36.6 C)  SpO2: 96%   Wt Readings from Last 3 Encounters:  09/15/20 170 lb (77.1 kg)  08/01/20 165 lb 12.8 oz (75.2 kg)  07/04/20 166 lb (75.3 kg)   Physical Exam Vitals reviewed.   Constitutional:      Appearance: Normal appearance.  Cardiovascular:     Rate and Rhythm: Normal rate and regular rhythm.     Pulses: Normal pulses.     Heart sounds: Normal heart sounds.  Pulmonary:     Effort: Pulmonary effort is normal.     Breath sounds: Normal breath sounds.  Chest:  Breasts:     Right: Normal. No bleeding, inverted nipple, mass, nipple discharge, skin change or tenderness.     Left: Normal. No bleeding, inverted nipple, mass, nipple discharge, skin change (lumpectomy scar well healed) or tenderness.    Abdominal:     Palpations: Abdomen is soft. There is no hepatomegaly or mass.     Tenderness: There is no abdominal tenderness.     Hernia: No hernia is present.  Musculoskeletal:     Right lower leg: No edema.     Left lower leg: No edema.  Neurological:     General: No focal deficit present.     Mental Status: She is alert and oriented to person, place, and time.  Psychiatric:        Mood and Affect: Mood normal.        Behavior: Behavior normal.     Breast Exam Chaperone: Drue Second, MD     LABORATORY DATA:  I have reviewed the data as listed CMP Latest Ref Rng & Units 09/15/2020 08/01/2020 03/03/2020  Glucose 70 - 99 mg/dL 841(Y) 93 606(T)  BUN 8 - 23 mg/dL 21 14 18   Creatinine 0.44 - 1.00 mg/dL 0.16 0.10  Sodium 135 - 145 mmol/L 136 141 139  Potassium 3.5 - 5.1 mmol/L 4.6 4.7 3.8  Chloride 98 - 111 mmol/L 101 103 101  CO2 22 - 32 mmol/L 29 28 29   Calcium 8.9 - 10.3 mg/dL 9.8 9.32 9.7  Total Protein 6.5 - 8.1 g/dL 7.0 6.9 7.2  Total Bilirubin 0.3 - 1.2 mg/dL 0.7 0.7 0.6  Alkaline Phos 38 - 126 U/L 55 - 58  AST 15 - 41 U/L 28 30 30   ALT 0 - 44 U/L 30 34(H) 32   No results found for: CAN153 Lab Results  Component Value Date   WBC 6.8 09/15/2020   HGB 13.7 09/15/2020   HCT 41.3 09/15/2020   MCV 98.1 09/15/2020   PLT 222 09/15/2020   NEUTROABS 4.0 09/15/2020    ASSESSMENT:  1.  Stage I left breast cancer: -Diagnosed in  2009, status post lumpectomy and radiation therapy -Tamoxifen followed by Arimidex completed in December 2014  2.  Osteopenia: - Last DEXA scan on 07/01/2017 shows T score of -1.6. -She is started on Prolia on 01/04/2014.  Last dose was 08/29/2018 and she is tolerating it well. -DEXA scan on 08/26/2019 shows T score -1.6.   PLAN:  1.  Stage I left breast cancer: -Mammogram on 08/24/2020 was BI-RADS Category 1. -Physical examination did not reveal any palpable masses in bilateral breasts.  No palpable adenopathy. -Reviewed labs from 09/15/2020 which showed normal LFTs and CBC. -RTC 1 year for follow-up.  2.  Osteopenia: -Last DEXA scan on 08/26/2019 shows T score -1.6 consistent with osteopenia. -She has been on Prolia injections since 2015.  She will receive her last injection today and will discontinue it after that. -Plan to repeat DEXA scan prior to her next visit in 1 year.  Check vitamin D at next visit.    Breast Cancer therapy associated bone loss: I have recommended calcium, Vitamin D and weight bearing exercises.   No orders of the defined types were placed in this encounter.  The patient has a good understanding of the overall plan. she agrees with it. she will call with any problems that may develop before the next visit here.    Derek Jack, MD [place] [phone]   I, Milinda Antis, am acting as a scribe for Dr. Sanda Linger.  I, Derek Jack MD, have reviewed the above documentation for accuracy and completeness, and I agree with the above.

## 2020-09-15 NOTE — Patient Instructions (Signed)
[facility] at [facility] Discharge Instructions  You were seen today by Dr. Ellin Saba. He went over your recent results. You received your final Prolia injection today; you do not need to continue taking Prolia any longer. You will be scheduled for a bone density scan and a screening mammogram after 12/15 before your next visit. Dr. Ellin Saba will see you back in 1 year for labs and follow up.   Thank you for choosing [facility] at [facility] to provide your oncology and hematology care.  To afford each patient quality time with our provider, please arrive at least 15 minutes before your scheduled appointment time.   If you have a lab appointment with the Cancer Center please come in thru the Main Entrance and check in at the main information desk  You need to re-schedule your appointment should you arrive 10 or more minutes late.  We strive to give you quality time with our providers, and arriving late affects you and other patients whose appointments are after yours.  Also, if you no show three or more times for appointments you may be dismissed from the clinic at the providers discretion.     Again, thank you for choosing [facility].  Our hope is that these requests will decrease the amount of time that you wait before being seen by our physicians.       _____________________________________________________________  Should you have questions after your visit to [facility], please contact our office at [phone] between the hours of 8:00 a.m. and 4:30 p.m.  Voicemails left after 4:00 p.m. will not be returned until the following business day.  For prescription refill requests, have your pharmacy contact our office and allow 72 hours.    Cancer Center Support Programs:   > Cancer Support Group  2nd Tuesday of the month 1pm-2pm, Journey Room

## 2020-09-16 ENCOUNTER — Encounter: Payer: Self-pay | Admitting: Family Medicine

## 2020-09-16 ENCOUNTER — Other Ambulatory Visit: Payer: Self-pay | Admitting: Cardiology

## 2020-09-16 ENCOUNTER — Ambulatory Visit (INDEPENDENT_AMBULATORY_CARE_PROVIDER_SITE_OTHER): Payer: Medicare PPO

## 2020-09-16 VITALS — BP 127/82

## 2020-09-16 DIAGNOSIS — Z0000 Encounter for general adult medical examination without abnormal findings: Secondary | ICD-10-CM | POA: Diagnosis not present

## 2020-09-16 NOTE — Patient Instructions (Addendum)
Angela [name] , Thank you for taking time to come for your Medicare Wellness Visit. I appreciate your ongoing commitment to your health goals. Please review the following plan we discussed and let me know if I can assist you in the future.   Screening recommendations/referrals: Colonoscopy: Done 06/05/18 Mammogram: Done 08/24/20 Bone Density: Done 08/26/19 Recommended yearly ophthalmology/optometry visit for glaucoma screening and checkup Recommended yearly dental visit for hygiene and checkup  Vaccinations: Influenza vaccine: Done 05/05/20 Pneumococcal vaccine: Up to date Tdap vaccine: Up to date Shingles vaccine: Completed 07/01/17 & 09/20/17   Covid-19:Completed 2/2, 3/4, & 07/05/20  Advanced directives: Copies are in the chart  Conditions/risks identified: Lose weight   Next appointment: Follow up in one year for your annual wellness visit   Preventive Care 13 Years and Older, Female Preventive care refers to lifestyle choices and visits with your health care provider that can promote health and wellness. What does preventive care include?  A yearly physical exam. This is also called an annual well check.  Dental exams once or twice a year.  Routine eye exams. Ask your health care provider how often you should have your eyes checked.  Personal lifestyle choices, including:  Daily care of your teeth and gums.  Regular physical activity.  Eating a healthy diet.  Avoiding tobacco and drug use.  Limiting alcohol use.  Practicing safe sex.  Taking low-dose aspirin every day.  Taking vitamin and mineral supplements as recommended by your health care provider. What happens during an annual well check? The services and screenings done by your health care provider during your annual well check will depend on your age, overall health, lifestyle risk factors, and family history of disease. Counseling  Your health care provider may ask you questions about your:  Alcohol  use.  Tobacco use.  Drug use.  Emotional well-being.  Home and relationship well-being.  Sexual activity.  Eating habits.  History of falls.  Memory and ability to understand (cognition).  Work and work Statistician.  Reproductive health. Screening  You may have the following tests or measurements:  Height, weight, and BMI.  Blood pressure.  Lipid and cholesterol levels. These may be checked every 5 years, or more frequently if you are over 54 years old.  Skin check.  Lung cancer screening. You may have this screening every year starting at age 77 if you have a 30-pack-year history of smoking and currently smoke or have quit within the past 15 years.  Fecal occult blood test (FOBT) of the stool. You may have this test every year starting at age 4.  Flexible sigmoidoscopy or colonoscopy. You may have a sigmoidoscopy every 5 years or a colonoscopy every 10 years starting at age 44.  Hepatitis C blood test.  Hepatitis B blood test.  Sexually transmitted disease (STD) testing.  Diabetes screening. This is done by checking your blood sugar (glucose) after you have not eaten for a while (fasting). You may have this done every 1-3 years.  Bone density scan. This is done to screen for osteoporosis. You may have this done starting at age 49.  Mammogram. This may be done every 1-2 years. Talk to your health care provider about how often you should have regular mammograms. Talk with your health care provider about your test results, treatment options, and if necessary, the need for more tests. Vaccines  Your health care provider may recommend certain vaccines, such as:  Influenza vaccine. This is recommended every year.  Tetanus, diphtheria, and  acellular pertussis (Tdap, Td) vaccine. You may need a Td booster every 10 years.  Zoster vaccine. You may need this after age 53.  Pneumococcal 13-valent conjugate (PCV13) vaccine. One dose is recommended after age  72.  Pneumococcal polysaccharide (PPSV23) vaccine. One dose is recommended after age 47. Talk to your health care provider about which screenings and vaccines you need and how often you need them. This information is not intended to replace advice given to you by your health care provider. Make sure you discuss any questions you have with your health care provider. Document Released: 09/23/2015 Document Revised: 05/16/2016 Document Reviewed: 06/28/2015 Elsevier Interactive Patient Education  2017 [place] Prevention in the Home Falls can cause injuries. They can happen to people of all ages. There are many things you can do to make your home safe and to help prevent falls. What can I do on the outside of my home?  Regularly fix the edges of walkways and driveways and fix any cracks.  Remove anything that might make you trip as you walk through a door, such as a raised step or threshold.  Trim any bushes or trees on the path to your home.  Use bright outdoor lighting.  Clear any walking paths of anything that might make someone trip, such as rocks or tools.  Regularly check to see if handrails are loose or broken. Make sure that both sides of any steps have handrails.  Any raised decks and porches should have guardrails on the edges.  Have any leaves, snow, or ice cleared regularly.  Use sand or salt on walking paths during winter.  Clean up any spills in your garage right away. This includes oil or grease spills. What can I do in the bathroom?  Use night lights.  Install grab bars by the toilet and in the tub and shower. Do not use towel bars as grab bars.  Use non-skid mats or decals in the tub or shower.  If you need to sit down in the shower, use a plastic, non-slip stool.  Keep the floor dry. Clean up any water that spills on the floor as soon as it happens.  Remove soap buildup in the tub or shower regularly.  Attach bath mats securely with double-sided  non-slip rug tape.  Do not have throw rugs and other things on the floor that can make you trip. What can I do in the bedroom?  Use night lights.  Make sure that you have a light by your bed that is easy to reach.  Do not use any sheets or blankets that are too big for your bed. They should not hang down onto the floor.  Have a firm chair that has side arms. You can use this for support while you get dressed.  Do not have throw rugs and other things on the floor that can make you trip. What can I do in the kitchen?  Clean up any spills right away.  Avoid walking on wet floors.  Keep items that you use a lot in easy-to-reach places.  If you need to reach something above you, use a strong step stool that has a grab bar.  Keep electrical cords out of the way.  Do not use floor polish or wax that makes floors slippery. If you must use wax, use non-skid floor wax.  Do not have throw rugs and other things on the floor that can make you trip. What can I do with my stairs?  Do not leave any items on the stairs.  Make sure that there are handrails on both sides of the stairs and use them. Fix handrails that are broken or loose. Make sure that handrails are as long as the stairways.  Check any carpeting to make sure that it is firmly attached to the stairs. Fix any carpet that is loose or worn.  Avoid having throw rugs at the top or bottom of the stairs. If you do have throw rugs, attach them to the floor with carpet tape.  Make sure that you have a light switch at the top of the stairs and the bottom of the stairs. If you do not have them, ask someone to add them for you. What else can I do to help prevent falls?  Wear shoes that:  Do not have high heels.  Have rubber bottoms.  Are comfortable and fit you well.  Are closed at the toe. Do not wear sandals.  If you use a stepladder:  Make sure that it is fully opened. Do not climb a closed stepladder.  Make sure that both  sides of the stepladder are locked into place.  Ask someone to hold it for you, if possible.  Clearly mark and make sure that you can see:  Any grab bars or handrails.  First and last steps.  Where the edge of each step is.  Use tools that help you move around (mobility aids) if they are needed. These include:  Canes.  Walkers.  Scooters.  Crutches.  Turn on the lights when you go into a dark area. Replace any light bulbs as soon as they burn out.  Set up your furniture so you have a clear path. Avoid moving your furniture around.  If any of your floors are uneven, fix them.  If there are any pets around you, be aware of where they are.  Review your medicines with your doctor. Some medicines can make you feel dizzy. This can increase your chance of falling. Ask your doctor what other things that you can do to help prevent falls. This information is not intended to replace advice given to you by your health care provider. Make sure you discuss any questions you have with your health care provider. Document Released: 06/23/2009 Document Revised: 02/02/2016 Document Reviewed: 10/01/2014 Elsevier Interactive Patient Education  2017 [employer].

## 2020-09-16 NOTE — Progress Notes (Signed)
Subjective:   Angela [name] is a 75 y.o. female who presents for Medicare Annual (Subsequent) preventive examination.  Review of Systems     Cardiac Risk Factors include: advanced age (>35men, >12 women);hypertension;dyslipidemia     Objective:    Today's Vitals   09/16/20 1024  BP: 127/82   There is no height or weight on file to calculate BMI.  Advanced Directives 09/16/2020 09/15/2020 03/03/2020 09/02/2019 07/24/2019 06/01/2019 08/29/2018  Does Patient Have a Medical Advance Directive? Yes Yes No Yes Yes Yes Yes  Type of Paramedic of [place];Living will [place];Living will - [place];Living will [place];Living will [place];Living will [place];Living will  Does patient want to make changes to medical advance directive? - No - Patient declined - No - Patient declined No - Patient declined - No - Patient declined  Copy of [place] in Chart? No - copy requested No - copy requested - No - copy requested Yes - validated most recent copy scanned in chart (See row information) - No - copy requested  Would patient like information on creating a medical advance directive? - - No - Patient declined - - - -    Current Medications (verified) Outpatient Encounter Medications as of 09/16/2020  Medication Sig  . aspirin 81 MG tablet Take 81 mg by mouth every evening.  . Calcium Carbonate-Vitamin D 600-400 MG-UNIT tablet Take 2 tablets by mouth every evening.   . Cholecalciferol (VITAMIN D3) 50 MCG (2000 UT) capsule Take 2,000 Units by mouth every evening.   . Coenzyme Q10 (COQ-10) 100 MG CAPS Take 100 mg by mouth daily.   Marland Kitchen denosumab (PROLIA) 60 MG/ML SOLN injection Inject 60 mg into the skin every 6 (six) months.   . fish oil-omega-3 fatty acids 1000 MG capsule Take 1 g by mouth every evening.   . hydrochlorothiazide (HYDRODIURIL) 25 MG tablet Take 1  tablet (25 mg total) by mouth daily.  . isosorbide mononitrate (IMDUR) 30 MG 24 hr tablet Take 30 mg by mouth daily.  . metoprolol succinate (TOPROL-XL) 50 MG 24 hr tablet Take with or immediately following a meal.  . Multiple Vitamin (MULTIVITAMIN) tablet Take 1 tablet by mouth every evening.  . nitroGLYCERIN (NITROSTAT) 0.4 MG SL tablet Place 1 tablet (0.4 mg total) under the tongue every 5 (five) minutes as needed for chest pain.  . potassium chloride SA (KLOR-CON) 20 MEQ tablet TAKE 1 TABLET BY MOUTH EVERY DAY  . rosuvastatin (CRESTOR) 40 MG tablet Take 1 tablet (40 mg total) by mouth daily.  . vitamin C (ASCORBIC ACID) 500 MG tablet Take 500 mg by mouth every evening.   Marland Kitchen ZINC-A-COLD MT Use as directed in the mouth or throat. Cold Ease  . [DISCONTINUED] acetaminophen (TYLENOL) 500 MG tablet Take 500 mg by mouth every 6 (six) hours as needed for moderate pain or headache. (Patient not taking: Reported on 09/15/2020)   No facility-administered encounter medications on file as of 09/16/2020.    Allergies (verified) Lisinopril and Statins   History: Past Medical History:  Diagnosis Date  . Arthritis   . BCC (basal cell carcinoma)   . Breast cancer ([place]) 2009   Left Breast Cancer  . CAD (coronary artery disease)   . Cataract   . Essential hypertension   . Hyperlipidemia   . Macular retinal cyst of right eye   . OSA (obstructive sleep apnea)  CPAP  . Osteopenia   . Personal history of radiation therapy    Past Surgical History:  Procedure Laterality Date  . BREAST EXCISIONAL BIOPSY Right   . BREAST LUMPECTOMY Left 06/2008  . CATARACT EXTRACTION  2012  . COLONOSCOPY    . Eyelid surgery Bilateral   . INTRAVASCULAR PRESSURE WIRE/FFR STUDY N/A 06/01/2019   Procedure: INTRAVASCULAR PRESSURE WIRE/FFR STUDY;  Surgeon: Lyn Records, MD;  Location: [facility] INVASIVE CV LAB;  Service: Cardiovascular;  Laterality: N/A;  . LEFT HEART CATH AND CORONARY ANGIOGRAPHY N/A 06/01/2019   Procedure:  LEFT HEART CATH AND CORONARY ANGIOGRAPHY;  Surgeon: Lyn Records, MD;  Location: [facility] INVASIVE CV LAB;  Service: Cardiovascular;  Laterality: N/A;  . MASS EXCISION  03/2008   Appendix  . SKIN CANCER EXCISION    . TONSILLECTOMY     Family History  Problem Relation Age of Onset  . Heart attack Mother   . Hypertension Mother   . Heart disease Father   . Heart attack Father   . Hypertension Father   . Colon cancer Neg Hx   . Stroke Neg Hx   . Esophageal cancer Neg Hx   . Rectal cancer Neg Hx   . Stomach cancer Neg Hx    Social History   Socioeconomic History  . Marital status: Married    Spouse name: Not on file  . Number of children: 3  . Years of education: Not on file  . Highest education level: Not on file  Occupational History  . Occupation: retired    Associate Professor: RETIRED    Comment: works 1 day a week  Tobacco Use  . Smoking status: Never Smoker  . Smokeless tobacco: Never Used  Vaping Use  . Vaping Use: Never used  Substance and Sexual Activity  . Alcohol use: Yes    Alcohol/week: 3.0 standard drinks    Types: 3 Glasses of wine per week    Comment: one glass a week  . Drug use: No  . Sexual activity: Not on file  Other Topics Concern  . Not on file  Social History Narrative   Married. 2 daughters. 4 grandkids (teenagers in [place] and then two 3 and under in 2021)      Retired from teaching special ed mostly HS      Hobbies: travel, time with friends, eating out, lots of grandkids time in [place]   Social Determinants of Health   Financial Resource Strain: Low Risk   . Difficulty of Paying Living Expenses: Not hard at all  Food Insecurity: No Food Insecurity  . Worried About Programme researcher, broadcasting/film/video in the Last Year: Never true  . Ran Out of Food in the Last Year: Never true  Transportation Needs: No Transportation Needs  . Lack of Transportation (Medical): No  . Lack of Transportation (Non-Medical): No  Physical Activity: Inactive  . Days of Exercise per  Week: 0 days  . Minutes of Exercise per Session: 0 min  Stress: No Stress Concern Present  . Feeling of Stress : Not at all  Social Connections: Socially Integrated  . Frequency of Communication with Friends and Family: More than three times a week  . Frequency of Social Gatherings with Friends and Family: Once a week  . Attends Religious Services: More than 4 times per year  . Active Member of Clubs or Organizations: Yes  . Attends Banker Meetings: 1 to 4 times per year  . Marital Status: Married  Tobacco Counseling Counseling given: Not Answered   Clinical Intake:  Pre-visit preparation completed: Yes  Pain : No/denies pain     BMI - recorded: 27.44 Nutritional Risks: Nausea/ vomitting/ diarrhea (once a month loose stool) Diabetes: No  How often do you need to have someone help you when you read instructions, pamphlets, or other written materials from your doctor or pharmacy?: 1 - Never  Diabetic?No  Interpreter Needed?: No  Information entered by :: Charlott Rakes, LPN   Activities of Daily Living In your present state of health, do you have any difficulty performing the following activities: 09/16/2020 08/01/2020  Hearing? Y N  Comment hearing loss noted -  Vision? N N  Difficulty concentrating or making decisions? N N  Walking or climbing stairs? N N  Dressing or bathing? N N  Doing errands, shopping? N N  Preparing Food and eating ? N -  Using the Toilet? N -  Managing your Medications? N -  Managing your Finances? N -  Housekeeping or managing your Housekeeping? N -  Some recent data might be hidden    Patient Care Team: Marin Olp, MD as PCP - General (Family Medicine) Satira Sark, MD as PCP - Cardiology (Cardiology) Jovita Kussmaul, MD as Consulting Physician (General Surgery) Thea Silversmith, MD as Consulting Physician (Radiation Oncology) Inda Castle, MD (Inactive) as Consulting Physician (Gastroenterology) Danice Goltz, MD as Consulting Physician (Ophthalmology) Zadie Rhine Clent Demark, MD as Consulting Physician (Ophthalmology) Chesley Mires, MD as Consulting Physician (Pulmonary Disease) Derek Jack, MD as Consulting Physician (Hematology) Gardiner Barefoot, DPM as Consulting Physician (Podiatry) [name], MD as Consulting Physician (Dermatology)  Indicate any recent Medical Services you may have received from other than [facility] providers in the past year (date may be approximate).     Assessment:   This is a routine wellness examination for Angela [name].  Hearing/Vision screen  Hearing Screening   125Hz  250Hz  500Hz  1000Hz  2000Hz  3000Hz  4000Hz  6000Hz  8000Hz   Right ear:           Left ear:           Comments: Pt states hearing loss   Vision Screening Comments: Pt follows up with Dr Quentin Ore and Dr Zadie Rhine for annual eye exams and retina specialist  Dietary issues and exercise activities discussed: Current Exercise Habits: The patient does not participate in regular exercise at present  Goals    . Patient Stated     Lose weight       Depression Screen PHQ 2/9 Scores 09/16/2020 01/26/2020 07/24/2019 03/28/2018 03/22/2017 02/21/2016 11/12/2014  PHQ - 2 Score 0 0 0 0 0 0 0    Fall Risk Fall Risk  09/16/2020 01/26/2020 07/24/2019 03/28/2018 03/22/2017  Falls in the past year? 0 0 0 No No  Number falls in past yr: 0 - 0 - -  Injury with Fall? 0 - 0 - -  Risk for fall due to : Impaired vision No Fall Risks - - -  Follow up Falls prevention discussed - - - -    FALL RISK PREVENTION PERTAINING TO THE HOME:  Any stairs in or around the home? Yes  If so, are there any without handrails? No  Home free of loose throw rugs in walkways, pet beds, electrical cords, etc? Yes  Adequate lighting in your home to reduce risk of falls? Yes   ASSISTIVE DEVICES UTILIZED TO PREVENT FALLS:  Life alert? No  Use of a cane, walker or w/c? No  Grab bars in the bathroom? Yes  Shower chair or bench in  shower? No  Elevated toilet seat or a handicapped toilet? No   TIMED UP AND GO:  Was the test performed? No .     Cognitive Function:     6CIT Screen 09/16/2020 07/24/2019  What Year? 0 points 0 points  What month? 0 points 0 points  What time? - 0 points  Count back from 20 0 points 0 points  Months in reverse 0 points 0 points  Repeat phrase 0 points 0 points  Total Score - 0    Immunizations Immunization History  Administered Date(s) Administered  . Fluad Quad(high Dose 65+) 06/12/2019, 05/05/2020  . Hep A / Hep B 03/16/2013, 10/06/2013  . Hepatitis B, adult 04/17/2013  . Influenza Split 06/09/2012  . Influenza Whole 05/22/2011  . Influenza,inj,Quad PF,6+ Mos 05/27/2013  . Influenza-Unspecified 06/10/2014, 05/24/2015, 04/24/2016, 04/26/2017, 05/11/2018  . Moderna Sars-Covid-2 Vaccination 10/13/2019, 11/12/2019, 07/05/2020  . Pneumococcal Conjugate-13 11/12/2014  . Pneumococcal Polysaccharide-23 08/24/2008, 07/17/2013  . Td 04/19/2010  . Tdap 04/05/2016  . Zoster 11/17/2008  . Zoster Recombinat (Shingrix) 07/01/2017, 09/20/2017    TDAP status: Up to date  Flu Vaccine status: Up to date  Done 05/05/20 Pneumococcal vaccine status: Up to date  Covid-19 vaccine status: Completed vaccines  Qualifies for Shingles Vaccine? Yes   Zostavax completed Yes   Shingrix Completed?: Yes  Screening Tests Health Maintenance  Topic Date Due  . COVID-19 Vaccine (4 - Booster for Moderna series) 01/03/2021  . COLONOSCOPY (Pts 45-46yrs Insurance coverage will need to be confirmed)  06/05/2021  . MAMMOGRAM  08/24/2022  . TETANUS/TDAP  04/05/2026  . INFLUENZA VACCINE  Completed  . DEXA SCAN  Completed  . Hepatitis C Screening  Completed  . PNA vac Low Risk Adult  Completed    Health Maintenance  There are no preventive care reminders to display for this patient.  Colorectal cancer screening: Type of screening: Colonoscopy. Completed 06/05/18. Repeat every 3  years  Mammogram status: Completed 08/24/20. Repeat every year  Bone Density status: Completed 08/26/19. Results reflect: Bone density results: OSTEOPENIA. Repeat every 2 years.  Additional Screening:  Hepatitis C Screening:  Completed 02/21/16  Vision Screening: Recommended annual ophthalmology exams for early detection of glaucoma and other disorders of the eye. Is the patient up to date with their annual eye exam?  Yes  Who is the provider or what is the name of the office in which the patient attends annual eye exams? Dr Kathlen Mody   Dental Screening: Recommended annual dental exams for proper oral hygiene  Community Resource Referral / Chronic Care Management: CRR required this visit?  No   CCM required this visit?  No      Plan:     I have personally reviewed and noted the following in the patient's chart:   . Medical and social history . Use of alcohol, tobacco or illicit drugs  . Current medications and supplements . Functional ability and status . Nutritional status . Physical activity . Advanced directives . List of other physicians . Hospitalizations, surgeries, and ER visits in previous 12 months . Vitals . Screenings to include cognitive, depression, and falls . Referrals and appointments  In addition, I have reviewed and discussed with patient certain preventive protocols, quality metrics, and best practice recommendations. A written personalized care plan for preventive services as well as general preventive health recommendations were provided to patient.     Willette Brace, LPN  09/16/2020   Nurse Notes: Pt states she will send you a note related to the Oncologist has Discontinued her on prolia.

## 2020-09-19 DIAGNOSIS — G4733 Obstructive sleep apnea (adult) (pediatric): Secondary | ICD-10-CM | POA: Diagnosis not present

## 2020-09-21 ENCOUNTER — Telehealth: Payer: Self-pay

## 2020-09-21 ENCOUNTER — Encounter: Payer: Self-pay | Admitting: Podiatry

## 2020-09-21 NOTE — Telephone Encounter (Signed)
Patient wanted to speak with Otila Kluver, states she did a visit with Otila Kluver on 09/16/20. She said Otila Kluver documented that she was having hip replacement but [place] denies ever saying that and asked if this could be removed form her chart.

## 2020-09-23 NOTE — Telephone Encounter (Signed)
Met with Angela [name] on 1/12 and corrected this is the visit note and visit summary.

## 2020-10-05 ENCOUNTER — Ambulatory Visit: Payer: Medicare PPO | Admitting: Podiatry

## 2020-10-05 ENCOUNTER — Other Ambulatory Visit: Payer: Self-pay

## 2020-10-05 DIAGNOSIS — M722 Plantar fascial fibromatosis: Secondary | ICD-10-CM

## 2020-10-06 ENCOUNTER — Encounter: Payer: Self-pay | Admitting: Podiatry

## 2020-10-06 ENCOUNTER — Ambulatory Visit (INDEPENDENT_AMBULATORY_CARE_PROVIDER_SITE_OTHER): Payer: Medicare PPO | Admitting: Ophthalmology

## 2020-10-06 ENCOUNTER — Encounter (INDEPENDENT_AMBULATORY_CARE_PROVIDER_SITE_OTHER): Payer: Self-pay | Admitting: Ophthalmology

## 2020-10-06 DIAGNOSIS — G4733 Obstructive sleep apnea (adult) (pediatric): Secondary | ICD-10-CM | POA: Diagnosis not present

## 2020-10-06 DIAGNOSIS — H35073 Retinal telangiectasis, bilateral: Secondary | ICD-10-CM

## 2020-10-06 DIAGNOSIS — H2512 Age-related nuclear cataract, left eye: Secondary | ICD-10-CM

## 2020-10-06 NOTE — Progress Notes (Signed)
10/06/2020     CHIEF COMPLAINT Patient presents for Retina Follow Up (7 Month F/U OU//Pt denies noticeable changes to [place] OU since last visit. Pt denies ocular pain, flashes of light, or floaters OU. //)   HISTORY OF PRESENT ILLNESS: Angela [name] is a 75 y.o. female who presents to the clinic today for:   HPI    Retina Follow Up    Patient presents with  Other.  In both eyes.  This started 7 months ago.  Severity is mild.  Duration of 7 months.  Since onset it is stable. Additional comments: 7 Month F/U OU  Pt denies noticeable changes to [place] OU since last visit. Pt denies ocular pain, flashes of light, or floaters OU.             Comments    More history, patient seen in this office April 2017 found to have findings consistent with MAC-TEL. She had been prior to that on topical therapy, nonsteroidals in the right eye as a consideration of post cataract pseudophakic CME. 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  Further history elucidated today from the patie she clearly recalls having a recent diagnosis of sleep apnea confirmed in the latter quarter, 3 months, of 2016 and at that time commenced use of nightly oxygenation therapy CPAP at that time and then subsequently has migrated to using a nasal device with excellent compliance maintained throughout.   Patient prior to evaluation here in 2017 had been on topical NSAIDs for 5 years right eye. Those were since discontinued upon my recommendation at that time and she has not All residual MAC-TEL. had any recurrence or progression of CME as this is all        Last edited by Hurman Horn, MD on 10/06/2020  2:47 PM. (History)      Referring physician: Marin Olp, MD [place],  [place] [id number]  HISTORICAL INFORMATION:   Selected notes from the MEDICAL RECORD NUMBER    Lab Results  Component Value Date    HGBA1C 5.9 (H) 08/01/2020     CURRENT MEDICATIONS: No current outpatient medications on file. (Ophthalmic Drugs)   No current facility-administered medications for this visit. (Ophthalmic Drugs)   Current Outpatient Medications (Other)  Medication Sig  . aspirin 81 MG tablet Take 81 mg by mouth every evening.  . Calcium Carbonate-Vitamin D 600-400 MG-UNIT tablet Take 2 tablets by mouth every evening.   . Cholecalciferol (VITAMIN D3) 50 MCG (2000 UT) capsule Take 2,000 Units by mouth every evening.   . Coenzyme Q10 (COQ-10) 100 MG CAPS Take 100 mg by mouth daily.   Marland Kitchen denosumab (PROLIA) 60 MG/ML SOLN injection Inject 60 mg into the skin every 6 (six) months.   . fish oil-omega-3 fatty acids 1000 MG capsule Take 1 g by mouth every evening.   . hydrochlorothiazide (HYDRODIURIL) 25 MG tablet Take 1 tablet (25 mg total) by mouth daily.  . isosorbide mononitrate (IMDUR) 30 MG 24 hr tablet Take 30 mg by mouth daily.  . metoprolol succinate (TOPROL-XL) 50 MG 24 hr tablet Take with or immediately following a meal.  . Multiple Vitamin (MULTIVITAMIN) tablet Take 1 tablet by mouth every evening.  . nitroGLYCERIN (NITROSTAT) 0.4 MG SL tablet ONE TABLET UNDER TONGUE AS NEEDED FOR CHEST PAIN  . potassium chloride SA (KLOR-CON) 20 MEQ tablet TAKE 1 TABLET BY MOUTH EVERY  DAY  . rosuvastatin (CRESTOR) 40 MG tablet Take 1 tablet (40 mg total) by mouth daily.  . vitamin C (ASCORBIC ACID) 500 MG tablet Take 500 mg by mouth every evening.   Marland Kitchen ZINC-A-COLD MT Use as directed in the mouth or throat. Cold Ease   No current facility-administered medications for this visit. (Other)      REVIEW OF SYSTEMS:    ALLERGIES Allergies  Allergen Reactions  . Lisinopril Cough  . Statins Other (See Comments)    Myalgia with lipitor- unproven She is able to tolerate simvastatin    PAST MEDICAL HISTORY Past Medical History:  Diagnosis Date  . Arthritis   . BCC (basal cell carcinoma)   . Breast cancer  ([place]) 2009   Left Breast Cancer  . CAD (coronary artery disease)   . Cataract   . Essential hypertension   . Hyperlipidemia   . Macular retinal cyst of right eye   . OSA (obstructive sleep apnea)    CPAP  . Osteopenia   . Personal history of radiation therapy    Past Surgical History:  Procedure Laterality Date  . BREAST EXCISIONAL BIOPSY Right   . BREAST LUMPECTOMY Left 06/2008  . CATARACT EXTRACTION  2012  . COLONOSCOPY    . Eyelid surgery Bilateral   . INTRAVASCULAR PRESSURE WIRE/FFR STUDY N/A 06/01/2019   Procedure: INTRAVASCULAR PRESSURE WIRE/FFR STUDY;  Surgeon: Belva Crome, MD;  Location: [facility];  Service: Cardiovascular;  Laterality: N/A;  . LEFT HEART CATH AND CORONARY ANGIOGRAPHY N/A 06/01/2019   Procedure: LEFT HEART CATH AND CORONARY ANGIOGRAPHY;  Surgeon: Belva Crome, MD;  Location: [facility];  Service: Cardiovascular;  Laterality: N/A;  . MASS EXCISION  03/2008   Appendix  . SKIN CANCER EXCISION    . TONSILLECTOMY      FAMILY HISTORY Family History  Problem Relation Age of Onset  . Heart attack Mother   . Hypertension Mother   . Heart disease Father   . Heart attack Father   . Hypertension Father   . Colon cancer Neg Hx   . Stroke Neg Hx   . Esophageal cancer Neg Hx   . Rectal cancer Neg Hx   . Stomach cancer Neg Hx     SOCIAL HISTORY Social History   Tobacco Use  . Smoking status: Never Smoker  . Smokeless tobacco: Never Used  Vaping Use  . Vaping Use: Never used  Substance Use Topics  . Alcohol use: Yes    Alcohol/week: 3.0 standard drinks    Types: 3 Glasses of wine per week    Comment: one glass a week  . Drug use: No         OPHTHALMIC EXAM:  Base Eye Exam    Visual Acuity (ETDRS)      Right Left   Dist cc 20/50 +2 20/30 -1   Dist ph cc 20/40 +1 NI   Correction: Glasses       Tonometry (Tonopen, 1:51 PM)      Right Left   Pressure 16 16       Pupils      Pupils Dark Light Shape React APD    Right PERRL 4 3 Round Brisk None   Left PERRL 4 3 Round Brisk None       Visual Fields (Counting fingers)      Left Right    Full Full       Extraocular Movement  Right Left    Full Full       Neuro/Psych    Oriented x3: Yes   Mood/Affect: Normal       Dilation    Both eyes: 1.0% Mydriacyl, 2.5% Phenylephrine @ 1:55 PM        Slit Lamp and Fundus Exam    External Exam      Right Left   External Normal Normal       Slit Lamp Exam      Right Left   Lids/Lashes Normal Normal   Conjunctiva/Sclera White and quiet White and quiet   Cornea Clear Clear   Anterior Chamber Deep and quiet Deep and quiet   Iris Round and reactive Round and reactive   Lens Centered posterior chamber intraocular lens 2+ Nuclear sclerosis   Anterior Vitreous Normal Normal       Fundus Exam      Right Left   Posterior Vitreous Asteroid hyalosis Normal   Disc Normal Normal   C/D Ratio 0.25 0.25   Macula Pseudocystoid change in the fovea, no visible microaneurysms, no visible right ankle venules Temporal aspect of the fovea with a small old microaneurysms, no exudate, no crystals retina   Vessels Normal Normal   Periphery Inferotemporal peripheral retina schisis no inner and outer holes Normal          IMAGING AND PROCEDURES  Imaging and Procedures for 10/06/20  OCT, Retina - OU - Both Eyes       Right Eye Quality was good. Scan locations included subfoveal. Central Foveal Thickness: 233. Progression has been stable. Findings include abnormal foveal contour, cystoid macular edema.   Left Eye Quality was good. Scan locations included subfoveal. Central Foveal Thickness: 241. Progression has been stable. Findings include abnormal foveal contour, cystoid macular edema.   Notes Technically intraretinal Cystoid change which is not pseudophakic CME but rather cavitary lesions of prior MAC-TEL when it was active, now stabilized and controlled since patient has no nightly hypoxic events  while using CPAP or its equivalent via nasal device                ASSESSMENT/PLAN:  OSA (obstructive sleep apnea) On nasal appliance, with good compliance, and good scores on oxygenation  Nuclear sclerotic cataract of left eye Okay to proceed with cataract traction with intraocular lens placement with Dr. Marshall Cork at any time.  Dr. Kathlen Mody should take comfort to know that this is not cystoid macular edema of inflammatory. This is MAC-TEL, well controlled as patient uses nightly oxygenation with positive airway pressure via nasal device with excellent compliance and monitoring which confirms such compliance      ICD-10-CM   1. Type 2 macular telangiectasis, bilateral  H35.073 OCT, Retina - OU - Both Eyes  2. OSA (obstructive sleep apnea)  G47.33   3. Nuclear sclerotic cataract of left eye  H25.12     1. No anatomic change in the configuration of MAC-TEL, she has classic findings of inner retina cavitary changes which look chronic and nonprogressive. Some transretinal scarring is present. She is excellently compliant with her nightly oxygenation device, via nasal application.  2. I have encouraged the patient to proceed with cataract surgery in the left eye at any time of her choosing. There is no contraindication given the findings in the back of the left eye for this to progress post cataract surgery  3.  Ophthalmic Meds Ordered this visit:  No orders of the defined types were placed in this  encounter.      Return in about 6 months (around 04/05/2021) for DILATE OU, OCT.  There are no Patient Instructions on file for this visit.   Explained the diagnoses, plan, and follow up with the patient and they expressed understanding.  Patient expressed understanding of the importance of proper follow up care.   Clent Demark Gavriel Holzhauer M.D. Diseases & Surgery of the Retina and Vitreous [facility]ina [facility]abetic [place] 10/06/20     Abbreviations: M myopia (nearsighted); A  astigmatism; H hyperopia (farsighted); P presbyopia; Mrx spectacle prescription;  CTL contact lenses; OD right eye; OS left eye; OU both eyes  XT exotropia; ET esotropia; PEK punctate epithelial keratitis; PEE punctate epithelial erosions; DES dry eye syndrome; MGD meibomian gland dysfunction; ATs artificial tears; PFAT's preservative free artificial tears; [place] nuclear sclerotic cataract; PSC posterior subcapsular cataract; ERM epi-retinal membrane; PVD posterior vitreous detachment; RD retinal detachment; DM diabetes mellitus; DR diabetic retinopathy; NPDR non-proliferative diabetic retinopathy; PDR proliferative diabetic retinopathy; CSME clinically significant macular edema; DME diabetic macular edema; dbh dot blot hemorrhages; CWS cotton wool spot; POAG primary open angle glaucoma; C/D cup-to-disc ratio; HVF humphrey visual field; GVF goldmann visual field; OCT optical coherence tomography; IOP intraocular pressure; BRVO Branch retinal vein occlusion; CRVO central retinal vein occlusion; CRAO central retinal artery occlusion; BRAO branch retinal artery occlusion; RT retinal tear; SB scleral buckle; PPV pars plana vitrectomy; VH Vitreous hemorrhage; PRP panretinal laser photocoagulation; IVK intravitreal kenalog; VMT vitreomacular traction; MH Macular hole;  NVD neovascularization of the disc; NVE neovascularization elsewhere; AREDS age related eye disease study; ARMD age related macular degeneration; POAG primary open angle glaucoma; EBMD epithelial/anterior basement membrane dystrophy; ACIOL anterior chamber intraocular lens; IOL intraocular lens; PCIOL posterior chamber intraocular lens; Phaco/IOL phacoemulsification with intraocular lens placement; [place] photorefractive keratectomy; LASIK laser assisted in situ keratomileusis; HTN hypertension; DM diabetes mellitus; COPD chronic obstructive pulmonary disease

## 2020-10-06 NOTE — Assessment & Plan Note (Signed)
On nasal appliance, with good compliance, and good scores on oxygenation

## 2020-10-06 NOTE — Progress Notes (Signed)
Subjective:  Patient ID: Angela [name], female    DOB: July 27, 1946,  MRN: [id number]  Chief Complaint  Patient presents with  . Plantar Fasciitis    PT stated that she is still having some pain and would like to discuss physical therapy as a treatment option.    75 y.o. female presents with the above complaint.  Patient presents with follow-up on right plantar fasciitis.  She states she is doing better.  She still has residual pain that has not gone away.  She would like to discuss physical therapy as an option.  She does not want cam boot immobilization.   Review of Systems: Negative except as noted in the HPI. Denies N/V/F/Ch.  Past Medical History:  Diagnosis Date  . Arthritis   . BCC (basal cell carcinoma)   . Breast cancer ([facility]) 2009   Left Breast Cancer  . CAD (coronary artery disease)   . Cataract   . Essential hypertension   . Hyperlipidemia   . Macular retinal cyst of right eye   . OSA (obstructive sleep apnea)    CPAP  . Osteopenia   . Personal history of radiation therapy     Current Outpatient Medications:  .  aspirin 81 MG tablet, Take 81 mg by mouth every evening., Disp: , Rfl:  .  Calcium Carbonate-Vitamin D 600-400 MG-UNIT tablet, Take 2 tablets by mouth every evening. , Disp: , Rfl:  .  Cholecalciferol (VITAMIN D3) 50 MCG (2000 UT) capsule, Take 2,000 Units by mouth every evening. , Disp: , Rfl:  .  Coenzyme Q10 (COQ-10) 100 MG CAPS, Take 100 mg by mouth daily. , Disp: , Rfl:  .  denosumab (PROLIA) 60 MG/ML SOLN injection, Inject 60 mg into the skin every 6 (six) months. , Disp: , Rfl:  .  fish oil-omega-3 fatty acids 1000 MG capsule, Take 1 g by mouth every evening. , Disp: , Rfl:  .  hydrochlorothiazide (HYDRODIURIL) 25 MG tablet, Take 1 tablet (25 mg total) by mouth daily., Disp: 90 tablet, Rfl: 3 .  isosorbide mononitrate (IMDUR) 30 MG 24 hr tablet, Take 30 mg by mouth daily., Disp: , Rfl:  .  metoprolol succinate (TOPROL-XL) 50 MG 24 hr tablet, Take with  or immediately following a meal., Disp: 90 tablet, Rfl: 3 .  Multiple Vitamin (MULTIVITAMIN) tablet, Take 1 tablet by mouth every evening., Disp: , Rfl:  .  nitroGLYCERIN (NITROSTAT) 0.4 MG SL tablet, ONE TABLET UNDER TONGUE AS NEEDED FOR CHEST PAIN, Disp: 25 tablet, Rfl: 6 .  potassium chloride SA (KLOR-CON) 20 MEQ tablet, TAKE 1 TABLET BY MOUTH EVERY DAY, Disp: 90 tablet, Rfl: 1 .  rosuvastatin (CRESTOR) 40 MG tablet, Take 1 tablet (40 mg total) by mouth daily., Disp: 90 tablet, Rfl: 3 .  vitamin C (ASCORBIC ACID) 500 MG tablet, Take 500 mg by mouth every evening. , Disp: , Rfl:  .  ZINC-A-COLD MT, Use as directed in the mouth or throat. Cold Ease, Disp: , Rfl:   Social History   Tobacco Use  Smoking Status Never Smoker  Smokeless Tobacco Never Used    Allergies  Allergen Reactions  . Lisinopril Cough  . Statins Other (See Comments)    Myalgia with lipitor- unproven She is able to tolerate simvastatin   Objective:  There were no vitals filed for this visit. There is no height or weight on file to calculate BMI. Constitutional Well developed. Well nourished.  Vascular Dorsalis pedis pulses palpable bilaterally. Posterior tibial pulses palpable  bilaterally. Capillary refill normal to all digits.  No cyanosis or clubbing noted. Pedal hair growth normal.  Neurologic Normal speech. Oriented to person, place, and time. Epicritic sensation to light touch grossly present bilaterally.  Dermatologic Painful ingrowing nail at lateral nail borders of the hallux nail right. No open wounds. No skin lesions.  Orthopedic: Normal joint ROM without pain or crepitus bilaterally. No visible deformities. Mild tender to palpation at the calcaneal tuber right. No pain with calcaneal squeeze right. Ankle ROM diminished range of motion right. Silfverskiold Test: positive right.   Radiographs: Taken and reviewed. No acute fractures or dislocations. No evidence of stress fracture.  Plantar heel  spur absent. Posterior heel spur present.   Assessment:   1. Plantar fasciitis of right foot    Plan:  Patient was evaluated and treated and all questions answered.  Plantar Fasciitis, right - XR reviewed as above.  - Educated on icing and stretching. Instructions given.  -Hold off on any further injection injectionw. - DME: Continue bracing it with night splint and plantar fascial brace - Pharmacologic management: None -Patient was given a prescription for physical therapy to help with the residual plantar fasciitis.  Pes planovalgus -I explained patient the etiology of pes planovalgus and its relationship with plantar fasciitis.  Patient already has custom-made orthotics and seems to be functioning well in good condition alignment.  I have asked her to start wearing them immediately.  Patient states understanding    Ingrown Nail, right -Clinically healed.  No follow-ups on file.  No follow-ups on file.

## 2020-10-06 NOTE — Assessment & Plan Note (Signed)
Okay to proceed with cataract traction with intraocular lens placement with Dr. Marshall Cork at any time.  Dr. Kathlen Mody should take comfort to know that this is not cystoid macular edema of inflammatory. This is MAC-TEL, well controlled as patient uses nightly oxygenation with positive airway pressure via nasal device with excellent compliance and monitoring which confirms such compliance

## 2020-10-11 DIAGNOSIS — M722 Plantar fascial fibromatosis: Secondary | ICD-10-CM | POA: Diagnosis not present

## 2020-10-12 ENCOUNTER — Encounter: Payer: Self-pay | Admitting: Family Medicine

## 2020-10-18 ENCOUNTER — Other Ambulatory Visit: Payer: Self-pay

## 2020-10-18 ENCOUNTER — Ambulatory Visit: Payer: Medicare PPO | Admitting: Dermatology

## 2020-10-18 ENCOUNTER — Encounter: Payer: Self-pay | Admitting: Dermatology

## 2020-10-18 DIAGNOSIS — Z1283 Encounter for screening for malignant neoplasm of skin: Secondary | ICD-10-CM

## 2020-10-18 DIAGNOSIS — Z85828 Personal history of other malignant neoplasm of skin: Secondary | ICD-10-CM | POA: Diagnosis not present

## 2020-10-18 DIAGNOSIS — Z872 Personal history of diseases of the skin and subcutaneous tissue: Secondary | ICD-10-CM | POA: Diagnosis not present

## 2020-10-18 DIAGNOSIS — M722 Plantar fascial fibromatosis: Secondary | ICD-10-CM | POA: Diagnosis not present

## 2020-10-21 DIAGNOSIS — M722 Plantar fascial fibromatosis: Secondary | ICD-10-CM | POA: Diagnosis not present

## 2020-10-28 ENCOUNTER — Encounter: Payer: Self-pay | Admitting: Dermatology

## 2020-10-28 NOTE — Progress Notes (Signed)
   Follow-Up Visit   Subjective  Angela [name] is a 75 y.o. female who presents for the following: Follow-up (Patient here today for 3 month follow up for recent LN2 on right root of nose and right temporal scalp.  Per patient the spots healed well. No new concerns.).  Recheck actinic keratoses that were treated and sites of basal cell. Location:  Duration:  Quality:  Associated Signs/Symptoms: Modifying Factors:  Severity:  Timing: Context:   Objective  Well appearing patient in no apparent distress; mood and affect are within normal limits. Objective  Right Shoulder - Anterior, Right Upper Back: History of multiple basal carcinomas, no sign recurrence, no new nonmole skin cancer  Objective  Head - Anterior (Face): ALL PREVIOUS CRYO LN2 ARE CLEAR    All sun exposed areas plus back examined.   Assessment & Plan    History of basal cell carcinoma (BCC) (2) Right Shoulder - Anterior; Right Upper Back  Annual skin examination, encouraged to self examine her skin twice annually.  History of actinic keratoses Head - Anterior (Face)  To return if new crusts appear, otherwise annual skin examination      I, [name], MD, have reviewed all documentation for this visit.  The documentation on 10/28/20 for the exam, diagnosis, procedures, and orders are all accurate and complete.

## 2020-11-01 DIAGNOSIS — M722 Plantar fascial fibromatosis: Secondary | ICD-10-CM | POA: Diagnosis not present

## 2020-11-07 DIAGNOSIS — M722 Plantar fascial fibromatosis: Secondary | ICD-10-CM | POA: Diagnosis not present

## 2020-11-16 DIAGNOSIS — M722 Plantar fascial fibromatosis: Secondary | ICD-10-CM | POA: Diagnosis not present

## 2020-11-21 DIAGNOSIS — M722 Plantar fascial fibromatosis: Secondary | ICD-10-CM | POA: Diagnosis not present

## 2020-11-24 DIAGNOSIS — M722 Plantar fascial fibromatosis: Secondary | ICD-10-CM | POA: Diagnosis not present

## 2020-11-30 ENCOUNTER — Ambulatory Visit: Payer: Medicare PPO | Admitting: Podiatry

## 2020-11-30 ENCOUNTER — Other Ambulatory Visit: Payer: Self-pay

## 2020-11-30 ENCOUNTER — Encounter: Payer: Self-pay | Admitting: Podiatry

## 2020-11-30 DIAGNOSIS — M722 Plantar fascial fibromatosis: Secondary | ICD-10-CM | POA: Diagnosis not present

## 2020-11-30 DIAGNOSIS — L600 Ingrowing nail: Secondary | ICD-10-CM

## 2020-11-30 NOTE — Patient Instructions (Signed)

## 2020-12-01 ENCOUNTER — Encounter: Payer: Self-pay | Admitting: Podiatry

## 2020-12-01 NOTE — Progress Notes (Signed)
Subjective:  Patient ID: Angela [name], female    DOB: 1946-09-05,  MRN: [id number]  Chief Complaint  Patient presents with  . Plantar Fasciitis    PT stated that she is doing well she stated that physical therapy has helped a lot     75 y.o. female presents with the above complaint.  Patient presents with follow-up on right plantar fasciitis.  She states she is doing better.  Her residual pain has considerably improved.  Physical therapy has helped a lot.  She now has a secondary complaint of right ingrown lateral border.  Patient states it painful to touch she would like to have it removed.  She states that it may have grown back since previous time.  She denies any other acute complaints.   Review of Systems: Negative except as noted in the HPI. Denies N/V/F/Ch.  Past Medical History:  Diagnosis Date  . Arthritis   . BCC (basal cell carcinoma of skin) 08/31/1994   RIGHT CLAVICLE INFERIOR  . BCC (basal cell carcinoma of skin) 02/21/1994   RIGHT SCAPULA TX CX3 5FU  . BCC (basal cell carcinoma of skin) 02/21/1994   RIGHT CLAVICAL TX CX3 5FU  . BCC (basal cell carcinoma of skin) 05/25/1994   RIGHT SCAPULA TX =MOHS  . BCC (basal cell carcinoma of skin) 02/02/1999   RIGHT FRONT SHOULDER TX CURET X3 ,EXC  . BCC (basal cell carcinoma of skin) 12/25/1999   RIGHT UPPER BACK CX3 5FU  . BCC (basal cell carcinoma of skin) 03/29/2005   RIGHT LATER LOWER LEG TX MOHS  . BCC (basal cell carcinoma of skin) 11/10/2005   LEFT ANT. NECK TX=MOHS  . BCC (basal cell carcinoma of skin) 08/26/2018   RIGHT FRONT SHOULDER TX WITH BX  . BCC (basal cell carcinoma) 08/31/1994   RIGHT SUPER CLAVICLE MEDIAL TX CX3 5FU  . Breast cancer ([place]) 2009   Left Breast Cancer  . CAD (coronary artery disease)   . Cataract   . Essential hypertension   . Hyperlipidemia   . Macular retinal cyst of right eye   . OSA (obstructive sleep apnea)    CPAP  . Osteopenia   . Personal history of radiation therapy      Current Outpatient Medications:  .  aspirin 81 MG tablet, Take 81 mg by mouth every evening., Disp: , Rfl:  .  Calcium Carbonate-Vitamin D 600-400 MG-UNIT tablet, Take 2 tablets by mouth every evening. , Disp: , Rfl:  .  Cholecalciferol (VITAMIN D3) 50 MCG (2000 UT) capsule, Take 2,000 Units by mouth every evening. , Disp: , Rfl:  .  Coenzyme Q10 (COQ-10) 100 MG CAPS, Take 100 mg by mouth daily. , Disp: , Rfl:  .  denosumab (PROLIA) 60 MG/ML SOLN injection, Inject 60 mg into the skin every 6 (six) months. , Disp: , Rfl:  .  fish oil-omega-3 fatty acids 1000 MG capsule, Take 1 g by mouth every evening. , Disp: , Rfl:  .  hydrochlorothiazide (HYDRODIURIL) 25 MG tablet, Take 1 tablet (25 mg total) by mouth daily., Disp: 90 tablet, Rfl: 3 .  isosorbide mononitrate (IMDUR) 30 MG 24 hr tablet, Take 30 mg by mouth daily., Disp: , Rfl:  .  metoprolol succinate (TOPROL-XL) 50 MG 24 hr tablet, Take with or immediately following a meal., Disp: 90 tablet, Rfl: 3 .  Multiple Vitamin (MULTIVITAMIN) tablet, Take 1 tablet by mouth every evening., Disp: , Rfl:  .  nitroGLYCERIN (NITROSTAT) 0.4 MG SL tablet, ONE TABLET  UNDER TONGUE AS NEEDED FOR CHEST PAIN, Disp: 25 tablet, Rfl: 6 .  potassium chloride SA (KLOR-CON) 20 MEQ tablet, TAKE 1 TABLET BY MOUTH EVERY DAY, Disp: 90 tablet, Rfl: 1 .  rosuvastatin (CRESTOR) 40 MG tablet, Take 1 tablet (40 mg total) by mouth daily., Disp: 90 tablet, Rfl: 3 .  vitamin C (ASCORBIC ACID) 500 MG tablet, Take 500 mg by mouth every evening. , Disp: , Rfl:  .  ZINC-A-COLD MT, Use as directed in the mouth or throat. Cold Ease, Disp: , Rfl:   Social History   Tobacco Use  Smoking Status Never Smoker  Smokeless Tobacco Never Used    Allergies  Allergen Reactions  . Lisinopril Cough  . Statins Other (See Comments)    Myalgia with lipitor- unproven She is able to tolerate simvastatin   Objective:  There were no vitals filed for this visit. There is no height or weight  on file to calculate BMI. Constitutional Well developed. Well nourished.  Vascular Dorsalis pedis pulses palpable bilaterally. Posterior tibial pulses palpable bilaterally. Capillary refill normal to all digits.  No cyanosis or clubbing noted. Pedal hair growth normal.  Neurologic Normal speech. Oriented to person, place, and time. Epicritic sensation to light touch grossly present bilaterally.  Dermatologic Painful ingrowing nail at lateral nail borders of the hallux nail right. No open wounds. No skin lesions.  Orthopedic: Normal joint ROM without pain or crepitus bilaterally. No visible deformities. Mild tender to palpation at the calcaneal tuber right. No pain with calcaneal squeeze right. Ankle ROM diminished range of motion right. Silfverskiold Test: positive right.   Radiographs: Taken and reviewed. No acute fractures or dislocations. No evidence of stress fracture.  Plantar heel spur absent. Posterior heel spur present.   Assessment:   1. Plantar fasciitis of right foot   2. Ingrown toenail of right foot    Plan:  Patient was evaluated and treated and all questions answered.  Ingrown Nail, right -Patient elects to proceed with minor surgery to remove ingrown toenail removal today. Consent reviewed and signed by patient. -Ingrown nail excised. See procedure note. -Educated on post-procedure care including soaking. Written instructions provided and reviewed. -Patient to follow up in 2 weeks for nail check.  Procedure: Excision of Ingrown Toenail Location: Right 1st toe lateral nail borders. Anesthesia: Lidocaine 1% plain; 1.5 mL and Marcaine 0.5% plain; 1.5 mL, digital block. Skin Prep: Betadine. Dressing: Silvadene; telfa; dry, sterile, compression dressing. Technique: Following skin prep, the toe was exsanguinated and a tourniquet was secured at the base of the toe. The affected nail border was freed, split with a nail splitter, and excised. Chemical matrixectomy was  then performed with phenol and irrigated out with alcohol. The tourniquet was then removed and sterile dressing applied. Disposition: Patient tolerated procedure well. Patient to return in 2 weeks for follow-up.   No follow-ups on file.  Plantar Fasciitis, right -Clinically healed with bracing and steroid injection.  Physical therapy has helped considerably.  She can continue physical therapy as needed.  I discussed with her in extensive detail about shoe gear modification and prevention technique.  If any foot and ankle issues arises come back and see me.  She states understanding  Pes planovalgus -I explained patient the etiology of pes planovalgus and its relationship with plantar fasciitis.  Patient already has custom-made orthotics and seems to be functioning well in good condition alignment.  I have asked her to start wearing them immediately.  Patient states understanding    Ingrown  Nail, right -Clinically healed.  No follow-ups on file.  No follow-ups on file.

## 2020-12-12 NOTE — Progress Notes (Signed)
Cardiology Office Note  Date: 12/13/2020   ID: Angela [name], DOB 02/17/1946, MRN [id number]  PCP:  Marin Olp, MD  Cardiologist:  Rozann Lesches, MD Electrophysiologist:  None   Chief Complaint  Patient presents with  . Cardiac follow-up    History of Present Illness: Angela [name] is a 75 y.o. female last seen in June 2021 by Mr. Leonides Sake NP.  She is here for a routine follow-up visit.  Continues to do well, no definite angina symptoms.  She does have an intermittent, very atypical, "pinprick" sensation on the left side to central portion of her chest that is nonexertional.  Sounds potentially inflammatory or neuropathic.  She does not have to use nitroglycerin.  Last LDL in November 2021 was down to 67.  She is tolerating Crestor now at 40 mg daily.  LFTs were normal.  I personally reviewed her ECG today which shows normal sinus rhythm.  Past Medical History:  Diagnosis Date  . Arthritis   . BCC (basal cell carcinoma of skin) 08/31/1994   RIGHT CLAVICLE INFERIOR  . BCC (basal cell carcinoma of skin) 02/21/1994   RIGHT SCAPULA TX CX3 5FU  . BCC (basal cell carcinoma of skin) 02/21/1994   RIGHT CLAVICAL TX CX3 5FU  . BCC (basal cell carcinoma of skin) 05/25/1994   RIGHT SCAPULA TX =MOHS  . BCC (basal cell carcinoma of skin) 02/02/1999   RIGHT FRONT SHOULDER TX CURET X3 ,EXC  . BCC (basal cell carcinoma of skin) 12/25/1999   RIGHT UPPER BACK CX3 5FU  . BCC (basal cell carcinoma of skin) 03/29/2005   RIGHT LATER LOWER LEG TX MOHS  . BCC (basal cell carcinoma of skin) 11/10/2005   LEFT ANT. NECK TX=MOHS  . BCC (basal cell carcinoma of skin) 08/26/2018   RIGHT FRONT SHOULDER TX WITH BX  . BCC (basal cell carcinoma) 08/31/1994   RIGHT SUPER CLAVICLE MEDIAL TX CX3 5FU  . Breast cancer ([place]) 2009   Left Breast Cancer  . CAD (coronary artery disease)   . Cataract   . Essential hypertension   . Hyperlipidemia   . Macular retinal cyst of right eye   . OSA  (obstructive sleep apnea)    CPAP  . Osteopenia   . Personal history of radiation therapy     Past Surgical History:  Procedure Laterality Date  . BREAST EXCISIONAL BIOPSY Right   . BREAST LUMPECTOMY Left 06/2008  . CATARACT EXTRACTION  2012  . COLONOSCOPY    . Eyelid surgery Bilateral   . INTRAVASCULAR PRESSURE WIRE/FFR STUDY N/A 06/01/2019   Procedure: INTRAVASCULAR PRESSURE WIRE/FFR STUDY;  Surgeon: Belva Crome, MD;  Location: [facility];  Service: Cardiovascular;  Laterality: N/A;  . LEFT HEART CATH AND CORONARY ANGIOGRAPHY N/A 06/01/2019   Procedure: LEFT HEART CATH AND CORONARY ANGIOGRAPHY;  Surgeon: Belva Crome, MD;  Location: [facility];  Service: Cardiovascular;  Laterality: N/A;  . MASS EXCISION  03/2008   Appendix  . SKIN CANCER EXCISION    . TONSILLECTOMY      Current Outpatient Medications  Medication Sig Dispense Refill  . aspirin 81 MG tablet Take 81 mg by mouth every evening.    . Calcium Carbonate-Vitamin D 600-400 MG-UNIT tablet Take 2 tablets by mouth every evening.     . Cholecalciferol (VITAMIN D3) 50 MCG (2000 UT) capsule Take 2,000 Units by mouth every evening.     . Coenzyme Q10 (COQ-10) 100 MG CAPS Take 100  mg by mouth daily.     . fish oil-omega-3 fatty acids 1000 MG capsule Take 1 g by mouth every evening.     . hydrochlorothiazide (HYDRODIURIL) 25 MG tablet Take 1 tablet (25 mg total) by mouth daily. 90 tablet 3  . isosorbide mononitrate (IMDUR) 30 MG 24 hr tablet Take 30 mg by mouth daily.    . metoprolol succinate (TOPROL-XL) 50 MG 24 hr tablet Take with or immediately following a meal. 90 tablet 3  . Multiple Vitamin (MULTIVITAMIN) tablet Take 1 tablet by mouth every evening.    . nitroGLYCERIN (NITROSTAT) 0.4 MG SL tablet ONE TABLET UNDER TONGUE AS NEEDED FOR CHEST PAIN 25 tablet 6  . potassium chloride SA (KLOR-CON) 20 MEQ tablet TAKE 1 TABLET BY MOUTH EVERY DAY 90 tablet 1  . rosuvastatin (CRESTOR) 40 MG tablet Take 1 tablet  (40 mg total) by mouth daily. 90 tablet 3  . vitamin C (ASCORBIC ACID) 500 MG tablet Take 500 mg by mouth every evening.     Marland Kitchen ZINC-A-COLD MT Use as directed in the mouth or throat. Cold Ease     No current facility-administered medications for this visit.   Allergies:  Lisinopril and Statins   ROS: No palpitations or syncope.  Physical Exam: VS:  BP 138/68   Pulse 64   Ht 5\' 6"  (1.676 m)   Wt 158 lb (71.7 kg)   LMP  (LMP Unknown)   SpO2 98%   BMI 25.50 kg/m , BMI Body mass index is 25.5 kg/m.  Wt Readings from Last 3 Encounters:  12/13/20 158 lb (71.7 kg)  09/15/20 170 lb (77.1 kg)  08/01/20 165 lb 12.8 oz (75.2 kg)    General: Patient appears comfortable at rest. HEENT: Conjunctiva and lids normal, wearing a mask. Neck: Supple, no elevated JVP or carotid bruits, no thyromegaly. Lungs: Clear to auscultation, nonlabored breathing at rest. Cardiac: Regular rate and rhythm, no S3 or significant systolic murmur, no pericardial rub. Extremities: No pitting edema.  ECG:  An ECG dated 06/01/2019 was personally reviewed today and demonstrated:  Sinus bradycardia with increased voltage.  Recent Labwork: 09/15/2020: ALT 30; AST 28; BUN 21; Creatinine, Ser 0.70; Hemoglobin 13.7; Platelets 222; Potassium 4.6; Sodium 136     Component Value Date/Time   CHOL 122 01/26/2020 1022   CHOL 169 01/14/2017 0835   TRIG 98.0 01/26/2020 1022   HDL 33.20 (L) 01/26/2020 1022   HDL 34 (L) 01/14/2017 0835   CHOLHDL 4 01/26/2020 1022   VLDL 19.6 01/26/2020 1022   LDLCALC 70 01/26/2020 1022   LDLCALC 90 01/14/2017 0835   LDLDIRECT 67 08/01/2020 1147    Other Studies Reviewed Today:  Cardiac catheterization 06/01/2019:  Right dominant coronary anatomy.  Moderate to heavy LAD and circumflex calcification.  Widely patent left main.  The proximal to mid LAD is heavily calcified and contains segmental 50% tubular narrowing over approximately 20 mm. The mid LAD contains relatively focal  calcified 65 to 75% stenosis. The LAD beyond this segment is widely patent. The first diagonal contains 60% ostial narrowing and arises from the calcified tubular stenosis.  Both DFR (0.9) and FFR (0.82)  Circumflex coronary artery contains no significant obstruction. The second obtuse marginal contains 50 to 60% ostial narrowing.  RCA is dominant with proximal 30 to 40% narrowing and mid 25% narrowing.  Normal left ventricular size and function.  Assessment and Plan:  1.  CAD, symptomatically stable with moderate, calcified mid LAD stenosis.  ECG reviewed and  stable.  Continue aspirin, Imdur, Toprol-XL, Crestor, and as needed nitroglycerin.  2.  Mixed hyperlipidemia, recent LDL 67, tolerating Crestor well.  Medication Adjustments/Labs and Tests Ordered: Current medicines are reviewed at length with the patient today.  Concerns regarding medicines are outlined above.   Tests Ordered: Orders Placed This Encounter  Procedures  . EKG 12-Lead    Medication Changes: No orders of the defined types were placed in this encounter.   Disposition:  Follow up 6 months in the [place] office.  Signed, Satira Sark, MD, [facility] 12/13/2020 11:06 AM    [facility], [place], [place] [address] Phone: [phone]; Fax: [phone]

## 2020-12-13 ENCOUNTER — Other Ambulatory Visit: Payer: Self-pay

## 2020-12-13 ENCOUNTER — Encounter: Payer: Self-pay | Admitting: Cardiology

## 2020-12-13 ENCOUNTER — Ambulatory Visit: Payer: Medicare PPO | Admitting: Cardiology

## 2020-12-13 VITALS — BP 138/68 | HR 64 | Ht 66.0 in | Wt 174.0 lb

## 2020-12-13 DIAGNOSIS — I25119 Atherosclerotic heart disease of native coronary artery with unspecified angina pectoris: Secondary | ICD-10-CM

## 2020-12-13 DIAGNOSIS — E782 Mixed hyperlipidemia: Secondary | ICD-10-CM | POA: Diagnosis not present

## 2020-12-13 NOTE — Patient Instructions (Addendum)

## 2020-12-30 DIAGNOSIS — J209 Acute bronchitis, unspecified: Secondary | ICD-10-CM | POA: Diagnosis not present

## 2020-12-30 DIAGNOSIS — J019 Acute sinusitis, unspecified: Secondary | ICD-10-CM | POA: Diagnosis not present

## 2020-12-30 DIAGNOSIS — R059 Cough, unspecified: Secondary | ICD-10-CM | POA: Diagnosis not present

## 2020-12-30 DIAGNOSIS — J029 Acute pharyngitis, unspecified: Secondary | ICD-10-CM | POA: Diagnosis not present

## 2021-01-05 ENCOUNTER — Other Ambulatory Visit: Payer: Self-pay | Admitting: Cardiology

## 2021-01-12 ENCOUNTER — Other Ambulatory Visit: Payer: Self-pay | Admitting: Cardiology

## 2021-01-31 ENCOUNTER — Other Ambulatory Visit: Payer: Self-pay

## 2021-01-31 ENCOUNTER — Encounter: Payer: Self-pay | Admitting: Family Medicine

## 2021-01-31 ENCOUNTER — Ambulatory Visit: Payer: Medicare PPO | Admitting: Family Medicine

## 2021-01-31 VITALS — BP 140/80 | HR 61 | Temp 98.6°F | Ht 66.0 in | Wt 173.0 lb

## 2021-01-31 DIAGNOSIS — M810 Age-related osteoporosis without current pathological fracture: Secondary | ICD-10-CM | POA: Diagnosis not present

## 2021-01-31 DIAGNOSIS — I10 Essential (primary) hypertension: Secondary | ICD-10-CM

## 2021-01-31 DIAGNOSIS — I25118 Atherosclerotic heart disease of native coronary artery with other forms of angina pectoris: Secondary | ICD-10-CM | POA: Diagnosis not present

## 2021-01-31 DIAGNOSIS — E785 Hyperlipidemia, unspecified: Secondary | ICD-10-CM

## 2021-01-31 DIAGNOSIS — R739 Hyperglycemia, unspecified: Secondary | ICD-10-CM

## 2021-01-31 LAB — COMPREHENSIVE METABOLIC PANEL
ALT: 26 U/L (ref 0–35)
AST: 24 U/L (ref 0–37)
Albumin: 4.5 g/dL (ref 3.5–5.2)
Alkaline Phosphatase: 53 U/L (ref 39–117)
BUN: 16 mg/dL (ref 6–23)
CO2: 31 mEq/L (ref 19–32)
Calcium: 10.1 mg/dL (ref 8.4–10.5)
Chloride: 104 mEq/L (ref 96–112)
Creatinine, Ser: 0.65 mg/dL (ref 0.40–1.20)
GFR: 86.92 mL/min (ref >60.00)
Glucose, Bld: 101 mg/dL — ABNORMAL HIGH (ref 70–99)
Potassium: 4.6 mEq/L (ref 3.5–5.1)
Sodium: 140 mEq/L (ref 135–145)
Total Bilirubin: 0.5 mg/dL (ref 0.2–1.2)
Total Protein: 7 g/dL (ref 6.0–8.3)

## 2021-01-31 LAB — CBC WITH DIFFERENTIAL/PLATELET
Basophils Absolute: 0 10*3/uL (ref 0.0–0.1)
Basophils Relative: 0.6 % (ref 0.0–3.0)
Eosinophils Absolute: 0.2 10*3/uL (ref 0.0–0.7)
Eosinophils Relative: 2.3 % (ref 0.0–5.0)
HCT: 41.9 % (ref 36.0–46.0)
Hemoglobin: 14.3 g/dL (ref 12.0–15.0)
Lymphocytes Relative: 30.5 % (ref 12.0–46.0)
Lymphs Abs: 2.1 10*3/uL (ref 0.7–4.0)
MCHC: 34.1 g/dL (ref 30.0–36.0)
MCV: 93.4 fl (ref 78.0–100.0)
Monocytes Absolute: 0.7 10*3/uL (ref 0.1–1.0)
Monocytes Relative: 10 % (ref 3.0–12.0)
Neutro Abs: 3.9 10*3/uL (ref 1.4–7.7)
Neutrophils Relative %: 56.6 % (ref 43.0–77.0)
Platelets: 218 10*3/uL (ref 150.0–400.0)
RBC: 4.48 Mil/uL (ref 3.87–5.11)
RDW: 12.4 % (ref 11.5–15.5)
WBC: 6.8 10*3/uL (ref 4.0–10.5)

## 2021-01-31 LAB — LIPID PANEL
Cholesterol: 118 mg/dL (ref 0–200)
HDL: 38.4 mg/dL — ABNORMAL LOW (ref >39.00)
LDL Cholesterol: 58 mg/dL (ref 0–99)
NonHDL: 79.84
Total CHOL/HDL Ratio: 3
Triglycerides: 109 mg/dL (ref 0.0–149.0)
VLDL: 21.8 mg/dL (ref 0.0–40.0)

## 2021-01-31 LAB — HEMOGLOBIN A1C: Hgb A1c MFr Bld: 6.2 % (ref 4.6–6.5)

## 2021-01-31 MED ORDER — ALENDRONATE SODIUM 70 MG PO TABS: 70.0000 mg | ORAL_TABLET | ORAL | 11 refills | Status: DC

## 2021-01-31 NOTE — Progress Notes (Addendum)
Phone [phone] In person visit   Subjective:   Angela [name] is a 75 y.o. year old very pleasant female patient who presents for/with See problem oriented charting Chief Complaint  Patient presents with  . Hypertension  . Hyperlipidemia  . Hyperglycemia  . Vitamin D Deficiency   This visit occurred during the SARS-CoV-2 public health emergency.  Safety protocols were in place, including screening questions prior to the visit, additional usage of staff PPE, and extensive cleaning of exam room while observing appropriate contact time as indicated for disinfecting solutions.   Past Medical History-  Patient Active Problem List   Diagnosis Date Noted  . CAD (coronary artery disease) 07/24/2019    Priority: High  . History of breast cancer 03/20/2011    Priority: High  . OSA (obstructive sleep apnea) 08/24/2015    Priority: Medium  . Hyperglycemia 11/12/2014    Priority: Medium  . Osteoporosis-due to prior treatment with Prolia/Fosamax 06/04/2014    Priority: Medium  . HTN (hypertension) 07/13/2008    Priority: Medium  . Hyperlipidemia 07/09/2007    Priority: Medium  . History of skin cancer 02/21/2016    Priority: Low  . Chest discomfort 10/05/2014    Priority: Low  . Vitamin D deficiency 07/09/2007    Priority: Low  . 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 Type 2 macular telangiectasis, 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  . 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 Nuclear sclerotic cataract of left eye 03/08/2020  . Numbness and tingling of both feet 11/10/2019  . Neuropathy 11/10/2019  . History of colon polyps 06/12/2018    Medications- reviewed and updated Current Outpatient Medications  Medication Sig Dispense Refill  . alendronate (FOSAMAX) 70 MG tablet Take 1 tablet (70 mg total) by mouth every 7 (seven) days. Take with a full glass of water on an empty stomach. 5 tablet 11  . aspirin 81 MG tablet Take 81 mg by mouth every evening.    . Calcium Carbonate-Vitamin D  600-400 MG-UNIT tablet Take 2 tablets by mouth every evening.     . Cholecalciferol (VITAMIN D3) 50 MCG (2000 UT) capsule Take 2,000 Units by mouth every evening.     . Coenzyme Q10 (COQ-10) 100 MG CAPS Take 100 mg by mouth daily.     . fish oil-omega-3 fatty acids 1000 MG capsule Take 1 g by mouth every evening.     . hydrochlorothiazide (HYDRODIURIL) 25 MG tablet Take 1 tablet (25 mg total) by mouth daily. 90 tablet 3  . isosorbide mononitrate (IMDUR) 30 MG 24 hr tablet TAKE 1 TABLET(30 MG) BY MOUTH TWICE DAILY 180 tablet 3  . metoprolol succinate (TOPROL-XL) 50 MG 24 hr tablet Take with or immediately following a meal. 90 tablet 3  . Multiple Vitamin (MULTIVITAMIN) tablet Take 1 tablet by mouth every evening.    . nitroGLYCERIN (NITROSTAT) 0.4 MG SL tablet ONE TABLET UNDER TONGUE AS NEEDED FOR CHEST PAIN 25 tablet 6  . potassium chloride SA (KLOR-CON) 20 MEQ tablet TAKE 1 TABLET BY MOUTH EVERY DAY 90 tablet 3  . rosuvastatin (CRESTOR) 40 MG tablet TAKE 1 TABLET(40 MG) BY MOUTH DAILY 90 tablet 3  . vitamin C (ASCORBIC ACID) 500 MG tablet Take 500 mg by mouth every evening.      No current facility-administered medications for this visit.     Objective:  BP 140/80   Pulse 61   Temp 98.6 F (37 C) (Temporal)   Ht 5\' 6"  (1.676 m)   Wt 173 lb (  78.5 kg)   LMP  (LMP Unknown)   SpO2 97%   BMI 27.92 kg/m  Gen: NAD, resting comfortably CV: RRR no murmurs rubs or gallops Lungs: CTAB no crackles, wheeze, rhonchi Abdomen: soft/nontender/nondistended/normal bowel sounds. No rebound or guarding.  Ext: no edema Skin: warm, dry Neuro: grossly normal, moves all extremities     Assessment and Plan   #History of breast cancer-status post lumpectomy and radiation therapy in 2009.  Completed Arimidex December 2014 after initially starting on tamoxifen.  Continues yearly mammograms-most recently done December  #Coronary artery disease of native artery of native heart with stable angina pectoris  ([place])- #hyperlipidemia S: Medication: aspirin 81 mg, imdur 30mg , nitroglycerin prn- has it but does not use it unless needed, rosuvastatin 40 mg   occasionally has a stinging sensation under armpits that is sporadic-cardiology has told her that this is not likely cardiac in origin.  She has no exertional pain Lab Results  Component Value Date   CHOL 122 01/26/2020   HDL 33.20 (L) 01/26/2020   LDLCALC 70 01/26/2020   LDLDIRECT 67 08/01/2020   TRIG 98.0 01/26/2020   CHOLHDL 4 01/26/2020    A/P: CAD is asymptomatic-continue current medication.  Hyperlipidemia has been well controlled-update lipids and likely continue current medication  #hypertension S: medication: hctz 25mg  (also on potassium), imdur 30mg , metoprolol 50mg  XR Home readings #s: at home systolic 353, diastolic unknown   BP Readings from Last 3 Encounters:  01/31/21 140/80  12/13/20 138/68  09/16/20 127/82   A/P: mildly poor controlled in office but has been well controlled at home. Continue current medication.  # Hyperglycemia/insulin resistance/prediabetes- peak a1c of 6.0 July 2019 S:  Medication: none Exercise- Has not exercised in 6 months but she is trying to walk more. Diet- she has not gained weight, tries not to eat the quantity she is use to eating, cuts back on sugar intake Lab Results  Component Value Date   HGBA1C 5.9 (H) 08/01/2020   HGBA1C 5.9 01/26/2020   HGBA1C 5.8 07/24/2019   A/P: Hopefully stable- has had some weight gain-update A1c with labs  #Plantar fasciitis-feeling better after completing exercise given by physical therapy. Mild pain from mid-September 2021-March 2022.  She had to have 2 injections as well  #Vitamin D deficiency #osteoporosis- on prolia with last dose September 15, 2020 S: Medication: Vitamin D3 2000 units daily- Oncologist stopped her Prolia after taking it for approximately 5 years. She has a bone density scan scheduled for Dec 2022.  -I reached out to Dr. Delton Coombes about  alendronate for 1 year after finishing prolia and physican was on board A/P: Vitamin D has been well controlled-needs at least 800 units/day-2000 is fine.  For osteoporosis we will trial Fosamax for 1 year at this point- she finished her last Prolia dose in January  Recommended follow up: Return in about 6 months (around 08/03/2021) for physical or sooner if needed. Future Appointments  Date Time Provider [place]  04/06/2021  1:30 PM Rankin, Clent Demark, MD RDE-RDE None  07/18/2021  9:30 AM [name], MD CD-GSO CDGSO  08/28/2021  9:30 AM AP-DG DEXA AP-DG [facility]  08/28/2021 10:00 AM AP-MM 1 AP-MM [facility]  09/13/2021  9:00 AM AP-ACAPA LAB AP-ACAPA None  09/20/2021  2:45 PM Derek Jack, MD AP-ACAPA None  09/22/2021 10:15 AM LB[facility] HEALTH COACH LB[facility]PC PEC  10/25/2021 10:15 AM [name], MD CD-[facility] CDGSO    Lab/Order associations:   ICD-10-CM   1. Hyperlipidemia, unspecified  hyperlipidemia type  E78.5 CBC with Differential/Platelet    Comprehensive metabolic panel    Lipid panel  2. Hyperglycemia  R73.9 Hemoglobin A1c  3. Coronary artery disease of native artery of native heart with stable angina pectoris ([place])  I25.118   4. Age-related osteoporosis without current pathological fracture  M81.0   5. Primary hypertension  I10     Meds ordered this encounter  Medications  . alendronate (FOSAMAX) 70 MG tablet    Sig: Take 1 tablet (70 mg total) by mouth every 7 (seven) days. Take with a full glass of water on an empty stomach.    Dispense:  5 tablet    Refill:  11   I,Harris Phan,acting as a scribe for Garret Reddish, MD.,have documented all relevant documentation on the behalf of Garret Reddish, MD,as directed by  Garret Reddish, MD while in the presence of Garret Reddish, MD.    I, Garret Reddish, MD, have reviewed all documentation for this visit. The documentation on 01/31/21 for the exam, diagnosis, procedures, and orders are all accurate and complete.    Return precautions advised.  Garret Reddish, MD

## 2021-01-31 NOTE — Patient Instructions (Addendum)
Please stop by lab before you go If you have mychart- we will send your results within 3 business days of [place] receiving them.  If you do not have mychart- we will call you about results within 5 business days of [place] receiving them.  *please also note that you will see labs on mychart as soon as they post. I will later go in and write notes on them- will say "notes from Dr. Yong Channel"  At a minimum, recommend 800 IU of vitamin D and 1200mg  of Calcium per day (some can be through diet - team please give her a handout for calcium in diet). You can get this with a calcium-vitamin D supplement.  Once you have the above in place, I would start taking fosamax 70mg  once a week.  Administer first thing in the morning and >30 minutes before the first food, beverage (except plain water), or other medication of the day. Do not take with mineral water or with other beverages. Stay upright (not to lie down) for at least 30 minutes after taking medicine and until after first food of the day (to reduce irritation). Must be taken with 6 to 8 oz of plain water. The tablet should be swallowed whole; do not chew or suck.  Recommended follow up: Return in about 6 months (around 08/03/2021) for physical or sooner if needed.

## 2021-02-01 ENCOUNTER — Encounter: Payer: Self-pay | Admitting: Family Medicine

## 2021-02-02 ENCOUNTER — Other Ambulatory Visit: Payer: Self-pay

## 2021-02-02 MED ORDER — METFORMIN HCL 500 MG PO TABS: 500.0000 mg | ORAL_TABLET | Freq: Two times a day (BID) | ORAL | 3 refills | Status: DC

## 2021-02-03 ENCOUNTER — Encounter: Payer: Self-pay | Admitting: Family Medicine

## 2021-02-09 DIAGNOSIS — G4733 Obstructive sleep apnea (adult) (pediatric): Secondary | ICD-10-CM | POA: Diagnosis not present

## 2021-02-15 NOTE — Telephone Encounter (Signed)
Called pt's DME and spoke with Tan to check to see if pt's [employer] enrollment was expired. Tan said she was going to have to have someone look at this and then they would call us back.

## 2021-02-15 NOTE — Telephone Encounter (Signed)
New mychart message sent by pt stating that DME had to be changed from [facility] to [facility] in [place], [place] due to her [employer] not being in network with [facility]. I called [facility] in [place], [place] about this and they said they would send a message to RT to add Dr. Halford Chessman as provider so we can be able to see pt's updated airivew data. Sent pt a response to her mychart message on this update and also let her know that we would check [employer] later to see if we are now able to see all of her data.

## 2021-02-20 NOTE — Telephone Encounter (Signed)
Spoke with [facility] and reviewed pt concerns of not being able to track C-Pap usage via personal app. Verified correct contact information for pt. [facility] sated nurse would to reach out to pt to assist with issues. Nothing further needed at this time.

## 2021-02-22 DIAGNOSIS — J209 Acute bronchitis, unspecified: Secondary | ICD-10-CM | POA: Diagnosis not present

## 2021-02-22 DIAGNOSIS — J029 Acute pharyngitis, unspecified: Secondary | ICD-10-CM | POA: Diagnosis not present

## 2021-02-22 DIAGNOSIS — J019 Acute sinusitis, unspecified: Secondary | ICD-10-CM | POA: Diagnosis not present

## 2021-02-22 DIAGNOSIS — Z20822 Contact with and (suspected) exposure to covid-19: Secondary | ICD-10-CM | POA: Diagnosis not present

## 2021-03-01 ENCOUNTER — Encounter: Payer: Self-pay | Admitting: Family Medicine

## 2021-03-20 NOTE — Telephone Encounter (Signed)
Received [facility] download from 01/23/2021 - 02/21/2021 Patient is 100 percent compliant with CPAP use Average usage days used 8 hours 42 minutes Pressure 5-15 cm H2O (14.4 cm H2O 95th percentile) AHI 0.4

## 2021-04-05 DIAGNOSIS — G4733 Obstructive sleep apnea (adult) (pediatric): Secondary | ICD-10-CM | POA: Diagnosis not present

## 2021-04-06 ENCOUNTER — Encounter (INDEPENDENT_AMBULATORY_CARE_PROVIDER_SITE_OTHER): Payer: Medicare PPO | Admitting: Ophthalmology

## 2021-04-11 ENCOUNTER — Ambulatory Visit (INDEPENDENT_AMBULATORY_CARE_PROVIDER_SITE_OTHER): Payer: Medicare PPO | Admitting: Ophthalmology

## 2021-04-11 ENCOUNTER — Other Ambulatory Visit: Payer: Self-pay

## 2021-04-11 ENCOUNTER — Encounter (INDEPENDENT_AMBULATORY_CARE_PROVIDER_SITE_OTHER): Payer: Self-pay | Admitting: Ophthalmology

## 2021-04-11 DIAGNOSIS — G4733 Obstructive sleep apnea (adult) (pediatric): Secondary | ICD-10-CM | POA: Diagnosis not present

## 2021-04-11 DIAGNOSIS — H2512 Age-related nuclear cataract, left eye: Secondary | ICD-10-CM

## 2021-04-11 DIAGNOSIS — H35073 Retinal telangiectasis, bilateral: Secondary | ICD-10-CM | POA: Diagnosis not present

## 2021-04-11 NOTE — Assessment & Plan Note (Signed)
OS, with persistent cataract.  Patient has the option to proceed with cataract extraction with intraocular lens placement left eye so as to maximize visual acuity but also to prepare for vitrectomy surgery or its requirement should a full-thickness macular hole progress.

## 2021-04-11 NOTE — Progress Notes (Signed)
04/11/2021     CHIEF COMPLAINT Patient presents for No chief complaint on file.   HISTORY OF PRESENT ILLNESS: Angela [name] is a 75 y.o. female who presents to the clinic today for:     Referring physician: Marin Olp, MD [place],  [place] [id number]  HISTORICAL INFORMATION:   Selected notes from the MEDICAL RECORD NUMBER    Lab Results  Component Value Date   HGBA1C 6.2 01/31/2021     CURRENT MEDICATIONS: No current outpatient medications on file. (Ophthalmic Drugs)   No current facility-administered medications for this visit. (Ophthalmic Drugs)   Current Outpatient Medications (Other)  Medication Sig   alendronate (FOSAMAX) 70 MG tablet Take 1 tablet (70 mg total) by mouth every 7 (seven) days. Take with a full glass of water on an empty stomach.   aspirin 81 MG tablet Take 81 mg by mouth every evening.   Calcium Carbonate-Vitamin D 600-400 MG-UNIT tablet Take 2 tablets by mouth every evening.    Cholecalciferol (VITAMIN D3) 50 MCG (2000 UT) capsule Take 2,000 Units by mouth every evening.    Coenzyme Q10 (COQ-10) 100 MG CAPS Take 100 mg by mouth daily.    fish oil-omega-3 fatty acids 1000 MG capsule Take 1 g by mouth every evening.    hydrochlorothiazide (HYDRODIURIL) 25 MG tablet Take 1 tablet (25 mg total) by mouth daily.   isosorbide mononitrate (IMDUR) 30 MG 24 hr tablet TAKE 1 TABLET(30 MG) BY MOUTH TWICE DAILY   metFORMIN (GLUCOPHAGE) 500 MG tablet Take 1 tablet (500 mg total) by mouth 2 (two) times daily with a meal.   metoprolol succinate (TOPROL-XL) 50 MG 24 hr tablet Take with or immediately following a meal.   Multiple Vitamin (MULTIVITAMIN) tablet Take 1 tablet by mouth every evening.   nitroGLYCERIN (NITROSTAT) 0.4 MG SL tablet ONE TABLET UNDER TONGUE AS NEEDED FOR CHEST PAIN   potassium chloride SA (KLOR-CON) 20 MEQ tablet TAKE 1 TABLET BY MOUTH EVERY DAY   rosuvastatin (CRESTOR) 40 MG tablet TAKE 1 TABLET(40 MG) BY MOUTH DAILY    vitamin C (ASCORBIC ACID) 500 MG tablet Take 500 mg by mouth every evening.    No current facility-administered medications for this visit. (Other)      REVIEW OF SYSTEMS:    ALLERGIES Allergies  Allergen Reactions   Lisinopril Cough   Statins Other (See Comments)    Myalgia with lipitor- unproven She is able to tolerate simvastatin    PAST MEDICAL HISTORY Past Medical History:  Diagnosis Date   Arthritis    BCC (basal cell carcinoma of skin) 08/31/1994   RIGHT CLAVICLE INFERIOR   BCC (basal cell carcinoma of skin) 02/21/1994   RIGHT SCAPULA TX CX3 5FU   BCC (basal cell carcinoma of skin) 02/21/1994   RIGHT CLAVICAL TX CX3 5FU   BCC (basal cell carcinoma of skin) 05/25/1994   RIGHT SCAPULA TX =MOHS   BCC (basal cell carcinoma of skin) 02/02/1999   RIGHT FRONT SHOULDER TX CURET X3 ,EXC   BCC (basal cell carcinoma of skin) 12/25/1999   RIGHT UPPER BACK CX3 5FU   BCC (basal cell carcinoma of skin) 03/29/2005   RIGHT LATER LOWER LEG TX MOHS   BCC (basal cell carcinoma of skin) 11/10/2005   LEFT ANT. NECK TX=MOHS   BCC (basal cell carcinoma of skin) 08/26/2018   RIGHT FRONT SHOULDER TX WITH BX   BCC (basal cell carcinoma) 08/31/1994   RIGHT SUPER CLAVICLE MEDIAL [place]  5FU   Breast cancer ([facility]) 2009   Left Breast Cancer   CAD (coronary artery disease)    Cataract    Essential hypertension    Hyperlipidemia    Macular retinal cyst of right eye    OSA (obstructive sleep apnea)    CPAP   Osteopenia    Personal history of radiation therapy    Past Surgical History:  Procedure Laterality Date   BREAST EXCISIONAL BIOPSY Right    BREAST LUMPECTOMY Left 06/2008   CATARACT EXTRACTION  2012   COLONOSCOPY     Eyelid surgery Bilateral    INTRAVASCULAR PRESSURE WIRE/FFR STUDY N/A 06/01/2019   Procedure: INTRAVASCULAR PRESSURE WIRE/FFR STUDY;  Surgeon: Belva Crome, MD;  Location: [facility];  Service: Cardiovascular;  Laterality: N/A;   LEFT HEART CATH AND  CORONARY ANGIOGRAPHY N/A 06/01/2019   Procedure: LEFT HEART CATH AND CORONARY ANGIOGRAPHY;  Surgeon: Belva Crome, MD;  Location: [facility];  Service: Cardiovascular;  Laterality: N/A;   MASS EXCISION  03/2008   Appendix   SKIN CANCER EXCISION     TONSILLECTOMY      FAMILY HISTORY Family History  Problem Relation Age of Onset   Heart attack Mother    Hypertension Mother    Heart disease Father    Heart attack Father    Hypertension Father    Colon cancer Neg Hx    Stroke Neg Hx    Esophageal cancer Neg Hx    Rectal cancer Neg Hx    Stomach cancer Neg Hx     SOCIAL HISTORY Social History   Tobacco Use   Smoking status: Never   Smokeless tobacco: Never  Vaping Use   Vaping Use: Never used  Substance Use Topics   Alcohol use: Yes    Alcohol/week: 3.0 standard drinks    Types: 3 Glasses of wine per week    Comment: one glass a week   Drug use: No         OPHTHALMIC EXAM:  Base Eye Exam     Visual Acuity (ETDRS)       Right Left   Dist cc 20/40 -3 20/40   Dist ph [place] 20/40     Correction: Glasses         Tonometry (Tonopen, 9:44 AM)       Right Left   Pressure 14 17         Neuro/Psych     Oriented x3: Yes   Mood/Affect: Normal         Dilation     Both eyes: 1.0% Mydriacyl, 2.5% Phenylephrine @ 9:44 AM           Slit Lamp and Fundus Exam     External Exam       Right Left   External Normal Normal         Slit Lamp Exam       Right Left   Lids/Lashes Normal Normal   Conjunctiva/Sclera White and quiet White and quiet   Cornea Clear Clear   Anterior Chamber Deep and quiet Deep and quiet   Iris Round and reactive Round and reactive   Lens Centered posterior chamber intraocular lens 2+ Nuclear sclerosis   Anterior Vitreous Normal Normal         Fundus Exam       Right Left   Posterior Vitreous Asteroid hyalosis, no sign of PVD Normal, no sign of PVD   Disc Normal Normal  C/D Ratio 0.25 0.25   Macula  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 Scan locations included subfoveal. Central Foveal Thickness: 251. Progression has been stable. Findings include abnormal foveal contour, cystoid macular edema.   Left Eye Quality was good. Scan locations included subfoveal. Central Foveal Thickness: 246. Progression has been stable. Findings include abnormal foveal contour, cystoid macular edema.   Notes Technically intraretinal Cystoid change which is not pseudophakic CME but rather cavitary lesions of prior MAC-TEL when it was active, OS now with outer gap in the photoreceptor layer that this could be tractional related Upon the intraretinal cystoid pseudocyst or change from prior MAC-TEL.  Will suggest fluorescein angiography so as to look for active leakage to determine whether this is traction induced thinning of the outer photoreceptor layer left eye or is this active vascular leakage             ASSESSMENT/PLAN:  OSA (obstructive sleep apnea) Pt continues with excellent compliance  Type 2 macular telangiectasis, bilateral Patient continues on ResMed CPAP, has machine that is old or using 3G thus does not get daily updates but printouts from her provider suggests excellent compliance confirmed by her clinical impression  Nuclear sclerotic cataract of left eye OS, with persistent cataract.  Patient has the option to proceed with cataract extraction with intraocular lens placement left eye so as to maximize visual acuity but also to prepare for vitrectomy surgery or its  requirement should a full-thickness macular hole progress.     ICD-10-CM   1. Type 2 macular telangiectasis, bilateral  H35.073 OCT, Retina - OU - Both Eyes    2. OSA (obstructive sleep apnea)  G47.33     3. Nuclear sclerotic cataract of left eye  H25.12       1.  Return visit in the near future for fluorescein angiography left right eye to look for active leakage.  On angiography I discussed with the patient should there would be no active leakage from MAC-TEL, that would mean that the thin schisis cavities that are developing and attenuation of the foveal region is from vitreal macular adhesion and likely early macular hole formation.  With normal intact acuity I would not recommend surgical intervention for a "impending hole" because of the poor prognosis my experience with underlying MAC-TEL in the attenuation of the vasculature and the retinal thinning in these patients.  Will continue to monitor for to look for release of the posterior hyaloid.    2.  3.  Ophthalmic Meds Ordered this visit:  No orders of the defined types were placed in this encounter.      Return in about 2 weeks (around 04/25/2021) for DILATE OU, OPTOS FFA L/R, COLOR FP.  There are no Patient Instructions on file for this visit.   Explained the diagnoses, plan, and follow up with the patient and they expressed understanding.  Patient expressed understanding of the importance of proper follow up care.   Clent Demark Korri Ask M.D. Diseases & Surgery of the Retina and Vitreous  [facility] [place] 04/11/21     Abbreviations: M myopia (nearsighted); A astigmatism; H hyperopia (farsighted); P presbyopia; Mrx spectacle prescription;  CTL contact lenses; OD right eye; OS left eye; OU both eyes  XT exotropia; ET esotropia; PEK punctate epithelial keratitis; PEE punctate epithelial erosions; DES dry eye syndrome; MGD meibomian gland dysfunction; ATs artificial tears; PFAT's preservative free artificial  tears; [place] nuclear sclerotic cataract; PSC posterior subcapsular cataract; ERM epi-retinal membrane; PVD posterior vitreous detachment; RD retinal detachment; DM diabetes mellitus; DR diabetic retinopathy; NPDR non-proliferative diabetic retinopathy; PDR proliferative diabetic retinopathy; CSME clinically significant macular edema; DME diabetic macular edema; dbh dot blot hemorrhages; CWS cotton wool spot; POAG primary open angle glaucoma; C/D cup-to-disc ratio; HVF humphrey visual field; GVF goldmann visual field; OCT optical coherence tomography; IOP intraocular pressure; BRVO Branch retinal vein occlusion; CRVO central retinal vein occlusion; CRAO central retinal artery occlusion; BRAO branch retinal artery occlusion; RT retinal tear; SB scleral buckle; PPV pars plana vitrectomy; VH Vitreous hemorrhage; PRP panretinal laser photocoagulation; IVK intravitreal kenalog; VMT vitreomacular traction; MH Macular hole;  NVD neovascularization of the disc; NVE neovascularization elsewhere; AREDS age related eye disease study; ARMD age related macular degeneration; POAG primary open angle glaucoma; EBMD epithelial/anterior basement membrane dystrophy; ACIOL anterior chamber intraocular lens; IOL intraocular lens; PCIOL posterior chamber intraocular lens; Phaco/IOL phacoemulsification with intraocular lens placement; [place] photorefractive keratectomy; LASIK laser assisted in situ keratomileusis; HTN hypertension; DM diabetes mellitus; COPD chronic obstructive pulmonary disease

## 2021-04-11 NOTE — Assessment & Plan Note (Signed)
Pt continues with excellent compliance

## 2021-04-11 NOTE — Assessment & Plan Note (Signed)
Patient continues on ResMed CPAP, has machine that is old or using 3G thus does not get daily updates but printouts from her provider suggests excellent compliance confirmed by her clinical impression

## 2021-04-25 ENCOUNTER — Ambulatory Visit: Payer: Medicare PPO | Admitting: Pulmonary Disease

## 2021-04-25 ENCOUNTER — Encounter: Payer: Self-pay | Admitting: Pulmonary Disease

## 2021-04-25 ENCOUNTER — Ambulatory Visit (INDEPENDENT_AMBULATORY_CARE_PROVIDER_SITE_OTHER): Payer: Medicare PPO | Admitting: Ophthalmology

## 2021-04-25 ENCOUNTER — Other Ambulatory Visit: Payer: Self-pay

## 2021-04-25 ENCOUNTER — Encounter (INDEPENDENT_AMBULATORY_CARE_PROVIDER_SITE_OTHER): Payer: Self-pay | Admitting: Ophthalmology

## 2021-04-25 VITALS — BP 122/68 | HR 62 | Ht 66.0 in | Wt 167.0 lb

## 2021-04-25 DIAGNOSIS — G4733 Obstructive sleep apnea (adult) (pediatric): Secondary | ICD-10-CM | POA: Diagnosis not present

## 2021-04-25 DIAGNOSIS — H35073 Retinal telangiectasis, bilateral: Secondary | ICD-10-CM | POA: Diagnosis not present

## 2021-04-25 DIAGNOSIS — Z9989 Dependence on other enabling machines and devices: Secondary | ICD-10-CM

## 2021-04-25 DIAGNOSIS — H2512 Age-related nuclear cataract, left eye: Secondary | ICD-10-CM

## 2021-04-25 DIAGNOSIS — Z7189 Other specified counseling: Secondary | ICD-10-CM | POA: Diagnosis not present

## 2021-04-25 MED ORDER — FLUORESCEIN SODIUM 10 % IV SOLN
500.0000 mg | INTRAVENOUS | Status: AC | PRN
  Administered 2021-04-25: 500 mg via INTRAVENOUS

## 2021-04-25 NOTE — Progress Notes (Signed)
[facility]  Chief Complaint  Patient presents with   Follow-up    27yrf/u for OSA. States she is still using her cpap machine. Still using her [facility]. Wants to discuss getting a new machine.     Constitutional:  BP 122/68   Pulse 62   Ht '5\' 6"'$  (1.676 m)   Wt 167 lb (75.8 kg)   LMP  (LMP Unknown)   SpO2 97% Comment: on RA  BMI 26.95 kg/m   Past Medical History:  OA, Breast cancer 2009, CAD, Cataract, HTN, HLD, Macular retinal cyst Rt eye, Osteopenia  Past Surgical History:  She  has a past surgical history that includes Tonsillectomy; Mass excision (03/2008); Cataract extraction (2012); Colonoscopy; Eyelid surgery (Bilateral); Skin cancer excision; LEFT HEART CATH AND CORONARY ANGIOGRAPHY (N/A, 06/01/2019); INTRAVASCULAR PRESSURE WIRE/FFR STUDY (N/A, 06/01/2019); Breast lumpectomy (Left, 06/2008); and Breast excisional biopsy (Right).  Brief Summary:  Angela KANTOLAis a 75y.o. female with obstructive sleep apnea.      Subjective:   [place]  Uses CPAP nightly.  Has a nasal mask with chin strap.  Occasional dry mouth.  [place]  She has been limiting trips.  She is waiting for new vaccine to come out in the fall.  Physical Exam:   Appearance - well kempt   ENMT - no sinus tenderness, no oral exudate, no LAN, Mallampati 3 airway, no stridor, high arched palate  Respiratory - equal breath sounds bilaterally, no wheezing or rales  CV - s1s2 regular rate and rhythm, no murmurs  Ext - no clubbing, no edema  Skin - no rashes  Psych - normal mood and affect   Sleep Tests:  HST 08/15/15 >> AHI 52.9 SaO2 low 65% Auto 04/14/20 to 04/13/21 >> used on 363 of 365 nights with average 8 hrs 54 min.  Average AHI 0.4 with median CPAP 12 and 95 th percentile CPAP 14 cm H2O  Social History:  She  reports that she has never smoked. She has never used  smokeless tobacco. She reports current alcohol use of about 3.0 standard drinks per week. She reports that she does not use drugs.  Family History:  Her family history includes Heart attack in her father and mother; Heart disease in her father; Hypertension in her father and mother.     Assessment/Plan:   Obstructive sleep apnea. - she is compliant with CPAP and reports benefit from therapy - [place] [facility] [place] - her current machine is more than 74yrs old - will arrange for Resmed 11 auto CPAP with range 5 to 15 cm H2O and with a modem - explained she might need to repeat home sleep study prior to getting insurance approval for her machine  COVID 19 advice. - discussed new vaccine that will likely be coming out in the Fall - discussed methods to minimize risk of exposure if she travels  Time Spent Involved in Patient Care on Day of Examination:  32 minutes  Follow up:   Patient Instructions  Will arrange for new auto CPAP machine  [place]  Medication List:   Allergies as of 04/25/2021       Reactions   Lisinopril Cough   Statins Other (See Comments)   Myalgia with lipitor- unproven She is able to tolerate simvastatin  Medication List        Accurate as of April 25, 2021 10:36 AM. If you have any questions, ask your nurse or doctor.          alendronate 70 MG tablet Commonly known as: FOSAMAX Take 1 tablet (70 mg total) by mouth every 7 (seven) days. Take with a full glass of water on an empty stomach.   aspirin 81 MG tablet Take 81 mg by mouth every evening.   Calcium Carbonate-Vitamin D 600-400 MG-UNIT tablet Take 2 tablets by mouth every evening.   CoQ-10 100 MG Caps Take 100 mg by mouth daily.   fish oil-omega-3 fatty acids 1000 MG capsule Take 1 g by mouth every evening.   hydrochlorothiazide 25 MG tablet Commonly known as: HYDRODIURIL Take 1 tablet (25 mg total) by  mouth daily.   isosorbide mononitrate 30 MG 24 hr tablet Commonly known as: IMDUR TAKE 1 TABLET(30 MG) BY MOUTH TWICE DAILY   metFORMIN 500 MG tablet Commonly known as: GLUCOPHAGE Take 1 tablet (500 mg total) by mouth 2 (two) times daily with a meal.   metoprolol succinate 50 MG 24 hr tablet Commonly known as: TOPROL-XL Take with or immediately following a meal.   multivitamin tablet Take 1 tablet by mouth every evening.   nitroGLYCERIN 0.4 MG SL tablet Commonly known as: NITROSTAT ONE TABLET UNDER TONGUE AS NEEDED FOR CHEST PAIN   potassium chloride SA 20 MEQ tablet Commonly known as: KLOR-CON TAKE 1 TABLET BY MOUTH EVERY DAY   rosuvastatin 40 MG tablet Commonly known as: CRESTOR TAKE 1 TABLET(40 MG) BY MOUTH DAILY   vitamin C 500 MG tablet Commonly known as: ASCORBIC ACID Take 500 mg by mouth every evening.   Vitamin D3 50 MCG (2000 UT) capsule Take 2,000 Units by mouth every evening.        Signature:  Chesley Mires, MD [place] Pager - [phone] 04/25/2021, 10:36 AM

## 2021-04-25 NOTE — Assessment & Plan Note (Signed)
Follow-up with Dr. Marshall Cork as scheduled for November 2022

## 2021-04-25 NOTE — Assessment & Plan Note (Addendum)
Patient continues with excellent compliance on CPAP which is the treatment of choice for MAC-TEL and this physician's opinion   Fluorescein angiography OS confirms no significant increase macular leakage.  The thin attenuated findings on OCT thus could be mechanical in nature from vitreal macular traction or epiretinal membrane stretching in a tangential fashion, for which there is no current treatment should macular hole develop we would reassess

## 2021-04-25 NOTE — Progress Notes (Signed)
04/25/2021     CHIEF COMPLAINT Patient presents for Retina Follow Up   HISTORY OF PRESENT ILLNESS: Angela [name] is a 75 y.o. female who presents to the clinic today for:   HPI     Retina Follow Up           Diagnosis: Other   Laterality: both eyes   Onset: 2 weeks ago   Severity: mild   Duration: 2 weeks   Course: stable         Comments   2 week fu fp/ffa l/r Pt states VA OU stable since last visit. Pt denies FOL, floaters, or ocular pain OU.        Last edited by Kendra Opitz, COA on 04/25/2021  3:58 PM.      Referring physician: Hortencia Pilar, MD [place],  [place] [address]  HISTORICAL INFORMATION:   Selected notes from the MEDICAL RECORD NUMBER    Lab Results  Component Value Date   HGBA1C 6.2 01/31/2021     CURRENT MEDICATIONS: No current outpatient medications on file. (Ophthalmic Drugs)   No current facility-administered medications for this visit. (Ophthalmic Drugs)   Current Outpatient Medications (Other)  Medication Sig   alendronate (FOSAMAX) 70 MG tablet Take 1 tablet (70 mg total) by mouth every 7 (seven) days. Take with a full glass of water on an empty stomach.   aspirin 81 MG tablet Take 81 mg by mouth every evening.   Calcium Carbonate-Vitamin D 600-400 MG-UNIT tablet Take 2 tablets by mouth every evening.    Cholecalciferol (VITAMIN D3) 50 MCG (2000 UT) capsule Take 2,000 Units by mouth every evening.    Coenzyme Q10 (COQ-10) 100 MG CAPS Take 100 mg by mouth daily.    fish oil-omega-3 fatty acids 1000 MG capsule Take 1 g by mouth every evening.    hydrochlorothiazide (HYDRODIURIL) 25 MG tablet Take 1 tablet (25 mg total) by mouth daily.   isosorbide mononitrate (IMDUR) 30 MG 24 hr tablet TAKE 1 TABLET(30 MG) BY MOUTH TWICE DAILY   metFORMIN (GLUCOPHAGE) 500 MG tablet Take 1 tablet (500 mg total) by mouth 2 (two) times daily with a meal.   metoprolol succinate (TOPROL-XL) 50 MG 24 hr tablet Take with or  immediately following a meal.   Multiple Vitamin (MULTIVITAMIN) tablet Take 1 tablet by mouth every evening.   nitroGLYCERIN (NITROSTAT) 0.4 MG SL tablet ONE TABLET UNDER TONGUE AS NEEDED FOR CHEST PAIN   potassium chloride SA (KLOR-CON) 20 MEQ tablet TAKE 1 TABLET BY MOUTH EVERY DAY   rosuvastatin (CRESTOR) 40 MG tablet TAKE 1 TABLET(40 MG) BY MOUTH DAILY   vitamin C (ASCORBIC ACID) 500 MG tablet Take 500 mg by mouth every evening.    No current facility-administered medications for this visit. (Other)      REVIEW OF SYSTEMS:    ALLERGIES Allergies  Allergen Reactions   Lisinopril Cough   Statins Other (See Comments)    Myalgia with lipitor- unproven She is able to tolerate simvastatin    PAST MEDICAL HISTORY Past Medical History:  Diagnosis Date   Arthritis    BCC (basal cell carcinoma of skin) 08/31/1994   RIGHT CLAVICLE INFERIOR   BCC (basal cell carcinoma of skin) 02/21/1994   RIGHT SCAPULA TX CX3 5FU   BCC (basal cell carcinoma of skin) 02/21/1994   RIGHT CLAVICAL TX CX3 5FU   BCC (basal cell carcinoma of skin) 05/25/1994   RIGHT SCAPULA TX =MOHS  BCC (basal cell carcinoma of skin) 02/02/1999   RIGHT FRONT SHOULDER TX CURET X3 ,EXC   BCC (basal cell carcinoma of skin) 12/25/1999   RIGHT UPPER BACK CX3 5FU   BCC (basal cell carcinoma of skin) 03/29/2005   RIGHT LATER LOWER LEG TX MOHS   BCC (basal cell carcinoma of skin) 11/10/2005   LEFT ANT. NECK TX=MOHS   BCC (basal cell carcinoma of skin) 08/26/2018   RIGHT FRONT SHOULDER TX WITH BX   BCC (basal cell carcinoma) 08/31/1994   RIGHT SUPER CLAVICLE MEDIAL TX CX3 5FU   Breast cancer ([place]) 2009   Left Breast Cancer   CAD (coronary artery disease)    Cataract    Essential hypertension    Hyperlipidemia    Macular retinal cyst of right eye    OSA (obstructive sleep apnea)    CPAP   Osteopenia    Personal history of radiation therapy    Past Surgical History:  Procedure Laterality Date   BREAST  EXCISIONAL BIOPSY Right    BREAST LUMPECTOMY Left 06/2008   CATARACT EXTRACTION  2012   COLONOSCOPY     Eyelid surgery Bilateral    INTRAVASCULAR PRESSURE WIRE/FFR STUDY N/A 06/01/2019   Procedure: INTRAVASCULAR PRESSURE WIRE/FFR STUDY;  Surgeon: Belva Crome, MD;  Location: [facility];  Service: Cardiovascular;  Laterality: N/A;   LEFT HEART CATH AND CORONARY ANGIOGRAPHY N/A 06/01/2019   Procedure: LEFT HEART CATH AND CORONARY ANGIOGRAPHY;  Surgeon: Belva Crome, MD;  Location: [facility];  Service: Cardiovascular;  Laterality: N/A;   MASS EXCISION  03/2008   Appendix   SKIN CANCER EXCISION     TONSILLECTOMY      FAMILY HISTORY Family History  Problem Relation Age of Onset   Heart attack Mother    Hypertension Mother    Heart disease Father    Heart attack Father    Hypertension Father    Colon cancer Neg Hx    Stroke Neg Hx    Esophageal cancer Neg Hx    Rectal cancer Neg Hx    Stomach cancer Neg Hx     SOCIAL HISTORY Social History   Tobacco Use   Smoking status: Never   Smokeless tobacco: Never  Vaping Use   Vaping Use: Never used  Substance Use Topics   Alcohol use: Yes    Alcohol/week: 3.0 standard drinks    Types: 3 Glasses of wine per week    Comment: one glass a week   Drug use: No         OPHTHALMIC EXAM:  Base Eye Exam     Visual Acuity (ETDRS)       Right Left   Dist cc 20/50 -2 20/30 -1   Dist ph cc 20/40 -1          Tonometry (Tonopen, 4:01 PM)       Right Left   Pressure 13 11         Pupils       Pupils Dark Light Shape React APD   Right PERRL 4 3 Round Brisk None   Left PERRL 4 3 Round Brisk None         Visual Fields (Counting fingers)       Left Right    Full Full         Extraocular Movement       Right Left    Full Full         Neuro/Psych  Oriented x3: Yes   Mood/Affect: Normal         Dilation     Both eyes: 1.0% Mydriacyl, 2.5% Phenylephrine @ 4:01 PM            Slit Lamp and Fundus Exam     External Exam       Right Left   External Normal Normal         Slit Lamp Exam       Right Left   Lids/Lashes Normal Normal   Conjunctiva/Sclera White and quiet White and quiet   Cornea Clear Clear   Anterior Chamber Deep and quiet Deep and quiet   Iris Round and reactive Round and reactive   Lens Centered posterior chamber intraocular lens 2+ Nuclear sclerosis   Anterior Vitreous Normal Normal         Fundus Exam       Right Left   Posterior Vitreous Asteroid hyalosis, no sign of PVD Normal, no sign of PVD   Disc Normal Normal   C/D Ratio 0.25 0.25   Macula Pseudocystoid change in the fovea, no visible microaneurysms, no visible right ankle venules, Hard drusen Temporal aspect of the fovea with a small old microaneurysms, no exudate, no crystals retina, Hard drusen   Vessels Normal Normal   Periphery Inferotemporal peripheral retina schisis no inner and outer holes Normal            IMAGING AND PROCEDURES  Imaging and Procedures for 04/25/21  Color Fundus Photography Optos - OU - Both Eyes       Right Eye Progression has been stable. Disc findings include normal observations. Macula : microaneurysms. Vessels : normal observations.   Left Eye Progression has been stable. Macula : microaneurysms. Vessels : normal observations.   Notes Moderately dense asteroid hyalosis seen on color fundus photography in the right eye.  Left eye normal     Fluorescein Angiography Optos (Transit OS)       Injection: 500 mg Fluorescein Sodium 10 %   Route: Intravenous   NDC: 3154-0086-76   Right Eye   Progression has been stable. Mid/Late phase findings include leakage, microaneurysm. Choroidal neovascularization is not present.   Left Eye   Progression has been stable. Early phase findings include leakage, microaneurysm. Mid/Late phase findings include microaneurysm. Choroidal neovascularization is not present.    Notes Normal transit AV transit as well as arterial and venous phases.  Very late blush in the foveal vascular zone of each eye coincident and concomitant with the areas of atrophic change seen on OCT.  This is a sign of atrophic form of macular telangiectasis without active leakage of a significant amount.  Only a small amount of blush is seen in the late transit and late phases of the angiogram in the left eye.  Small late leakages on the temporal aspect of the fovea seen suggestive of prior vascular remodeling but no active ongoing CME             ASSESSMENT/PLAN:  Type 2 macular telangiectasis, bilateral Patient continues with excellent compliance on CPAP which is the treatment of choice for MAC-TEL and this physician's opinion   Fluorescein angiography OS confirms no significant increase macular leakage.  The thin attenuated findings on OCT thus could be mechanical in nature from vitreal macular traction or epiretinal membrane stretching in a tangential fashion, for which there is no current treatment should macular hole develop we would reassess  Nuclear sclerotic cataract of left eye Follow-up with  Dr. Marshall Cork as scheduled for November 2022     ICD-10-CM   1. Type 2 macular telangiectasis, bilateral  H35.073 Color Fundus Photography Optos - OU - Both Eyes    Fluorescein Angiography Optos (Transit OS)    Fluorescein Sodium 10 % injection 500 mg    2. Nuclear sclerotic cataract of left eye  H25.12       1.  OU stable overall, macula each eye functioning well, as patient continues on CPAP for MAC-TEL  2.  OS with nuclear sclerotic cataract follow-up with Dr. Marshall Cork as scheduled  3.  Ophthalmic Meds Ordered this visit:  Meds ordered this encounter  Medications   Fluorescein Sodium 10 % injection 500 mg       Return in about 6 months (around 10/26/2021) for DILATE OU, OCT.  There are no Patient Instructions on file for this  visit.   Explained the diagnoses, plan, and follow up with the patient and they expressed understanding.  Patient expressed understanding of the importance of proper follow up care.   Clent Demark Harpreet Pompey M.D. Diseases & Surgery of the Retina and Vitreous Retina & Diabetic [place] 04/25/21     Abbreviations: M myopia (nearsighted); A astigmatism; H hyperopia (farsighted); P presbyopia; Mrx spectacle prescription;  CTL contact lenses; OD right eye; OS left eye; OU both eyes  XT exotropia; ET esotropia; PEK punctate epithelial keratitis; PEE punctate epithelial erosions; DES dry eye syndrome; MGD meibomian gland dysfunction; ATs artificial tears; PFAT's preservative free artificial tears; [place] nuclear sclerotic cataract; PSC posterior subcapsular cataract; ERM epi-retinal membrane; PVD posterior vitreous detachment; RD retinal detachment; DM diabetes mellitus; DR diabetic retinopathy; NPDR non-proliferative diabetic retinopathy; PDR proliferative diabetic retinopathy; CSME clinically significant macular edema; DME diabetic macular edema; dbh dot blot hemorrhages; CWS cotton wool spot; POAG primary open angle glaucoma; C/D cup-to-disc ratio; HVF humphrey visual field; GVF goldmann visual field; OCT optical coherence tomography; IOP intraocular pressure; BRVO Branch retinal vein occlusion; CRVO central retinal vein occlusion; CRAO central retinal artery occlusion; BRAO branch retinal artery occlusion; RT retinal tear; SB scleral buckle; PPV pars plana vitrectomy; VH Vitreous hemorrhage; PRP panretinal laser photocoagulation; IVK intravitreal kenalog; VMT vitreomacular traction; MH Macular hole;  NVD neovascularization of the disc; NVE neovascularization elsewhere; AREDS age related eye disease study; ARMD age related macular degeneration; POAG primary open angle glaucoma; EBMD epithelial/anterior basement membrane dystrophy; ACIOL anterior chamber intraocular lens; IOL intraocular lens; PCIOL posterior chamber  intraocular lens; Phaco/IOL phacoemulsification with intraocular lens placement; [place] photorefractive keratectomy; LASIK laser assisted in situ keratomileusis; HTN hypertension; DM diabetes mellitus; COPD chronic obstructive pulmonary disease

## 2021-04-25 NOTE — Patient Instructions (Signed)
Will arrange for new auto CPAP machine  Follow up in 6 months in [place] or [place] office

## 2021-05-10 DIAGNOSIS — G4733 Obstructive sleep apnea (adult) (pediatric): Secondary | ICD-10-CM | POA: Diagnosis not present

## 2021-07-06 ENCOUNTER — Encounter: Payer: Self-pay | Admitting: Family Medicine

## 2021-07-11 ENCOUNTER — Encounter (INDEPENDENT_AMBULATORY_CARE_PROVIDER_SITE_OTHER): Payer: Self-pay | Admitting: Ophthalmology

## 2021-07-11 ENCOUNTER — Encounter: Payer: Self-pay | Admitting: Family Medicine

## 2021-07-15 ENCOUNTER — Other Ambulatory Visit: Payer: Self-pay | Admitting: Nurse Practitioner

## 2021-07-17 ENCOUNTER — Encounter: Payer: Self-pay | Admitting: Family Medicine

## 2021-07-17 ENCOUNTER — Encounter: Payer: Self-pay | Admitting: Podiatry

## 2021-07-17 ENCOUNTER — Ambulatory Visit: Payer: Medicare PPO | Admitting: Podiatry

## 2021-07-17 ENCOUNTER — Other Ambulatory Visit: Payer: Self-pay

## 2021-07-17 DIAGNOSIS — M79609 Pain in unspecified limb: Secondary | ICD-10-CM

## 2021-07-17 DIAGNOSIS — R300 Dysuria: Secondary | ICD-10-CM | POA: Insufficient documentation

## 2021-07-17 DIAGNOSIS — N3090 Cystitis, unspecified without hematuria: Secondary | ICD-10-CM | POA: Insufficient documentation

## 2021-07-17 DIAGNOSIS — R739 Hyperglycemia, unspecified: Secondary | ICD-10-CM | POA: Diagnosis not present

## 2021-07-17 DIAGNOSIS — B351 Tinea unguium: Secondary | ICD-10-CM | POA: Diagnosis not present

## 2021-07-17 NOTE — Progress Notes (Signed)
  Subjective:  Patient ID: Angela [name], female    DOB: [birth year],   MRN: [id number]  Chief Complaint  Patient presents with   Nail Problem    Trim nails     75 y.o. female presents for painful elongated and thickened nails as well as trouble with some ingrown nails. Has been seen by Dr. Posey Pronto in the past for plantar fasciiits and has had ingrowns removed twice on the right hallux nail. Patient relates difficulty with trimming her nails and requesting trim today. She is pre-diabetic. Last A1c was  6.2 01/31/21. Denies any other pedal complaints. Denies n/v/f/c.   PCP Garret Reddish MD.   Past Medical History:  Diagnosis Date   Arthritis    BCC (basal cell carcinoma of skin) 08/31/1994   RIGHT CLAVICLE INFERIOR   BCC (basal cell carcinoma of skin) 02/21/1994   RIGHT SCAPULA TX CX3 5FU   BCC (basal cell carcinoma of skin) 02/21/1994   RIGHT CLAVICAL TX CX3 5FU   BCC (basal cell carcinoma of skin) 05/25/1994   RIGHT SCAPULA TX =MOHS   BCC (basal cell carcinoma of skin) 02/02/1999   RIGHT FRONT SHOULDER TX CURET X3 ,EXC   BCC (basal cell carcinoma of skin) 12/25/1999   RIGHT UPPER BACK CX3 5FU   BCC (basal cell carcinoma of skin) 03/29/2005   RIGHT LATER LOWER LEG TX MOHS   BCC (basal cell carcinoma of skin) 11/10/2005   LEFT ANT. NECK TX=MOHS   BCC (basal cell carcinoma of skin) 08/26/2018   RIGHT FRONT SHOULDER TX WITH BX   BCC (basal cell carcinoma) 08/31/1994   RIGHT SUPER CLAVICLE MEDIAL TX CX3 5FU   Breast cancer ([place]) 2009   Left Breast Cancer   CAD (coronary artery disease)    Cataract    Essential hypertension    Hyperlipidemia    Macular retinal cyst of right eye    OSA (obstructive sleep apnea)    CPAP   Osteopenia    Personal history of radiation therapy     Objective:  Physical Exam: Vascular: DP/PT pulses 2/4 bilateral. CFT <3 seconds. Normal hair growth on digits. No edema.  Skin. No lacerations or abrasions bilateral feet. Nails 1-5 are thickened  discolored and elongated with subungual debris. Right hallux with some incurvation noted.  Musculoskeletal: MMT 5/5 bilateral lower extremities in DF, PF, Inversion and Eversion. Deceased ROM in DF of ankle joint.  Neurological: Sensation intact to light touch.   Assessment:   1. Onychomycosis   2. Pain due to onychomycosis of nail   3. Hyperglycemia      Plan:  Patient was evaluated and treated and all questions answered. -Discussed and educated patient on diabetic foot care, especially with  regards to the vascular, neurological and musculoskeletal systems. Patient is pre-diabetic. -Stressed the importance of good glycemic control and the detriment of not  controlling glucose levels in relation to the foot. -Discussed supportive shoes at all times and checking feet regularly.  -Mechanically debrided all nails 1-5 bilateral using sterile nail nipper and filed with dremel without incident as a courtesy -Answered all patient questions -Patient to return as needed  -Patient advised to call the office if any problems or questions arise in the meantime.   Lorenda Peck, DPM

## 2021-07-18 ENCOUNTER — Ambulatory Visit: Payer: Medicare PPO | Admitting: Dermatology

## 2021-08-01 ENCOUNTER — Ambulatory Visit: Payer: Medicare PPO | Admitting: Cardiology

## 2021-08-01 ENCOUNTER — Encounter: Payer: Self-pay | Admitting: Cardiology

## 2021-08-01 VITALS — BP 132/80 | HR 74 | Resp 18 | Ht 66.5 in | Wt 173.0 lb

## 2021-08-01 DIAGNOSIS — E782 Mixed hyperlipidemia: Secondary | ICD-10-CM

## 2021-08-01 DIAGNOSIS — I25119 Atherosclerotic heart disease of native coronary artery with unspecified angina pectoris: Secondary | ICD-10-CM

## 2021-08-01 DIAGNOSIS — G4733 Obstructive sleep apnea (adult) (pediatric): Secondary | ICD-10-CM | POA: Diagnosis not present

## 2021-08-01 NOTE — Progress Notes (Signed)
Cardiology Office Note  Date: 08/01/2021   ID: Emalie, Mcwethy 14-Jan-1946, MRN [id number]  PCP:  Marin Olp, MD  Cardiologist:  Rozann Lesches, MD Electrophysiologist:  None   Chief Complaint  Patient presents with   Cardiac follow-up    History of Present Illness: Angela [name] is a 75 y.o. female last seen in April.  She is here for a routine visit.  Reports intermittent and unpredictable aching as well as pin like sensations on the left side of her chest.  One episode she recalls more recently resolved with use of both Tums and nitroglycerin at the same time.  These are not reproducible, exertional symptoms that have not progressed since the last encounter.  She and her husband are walking about a mile 3 days a week.  She continues on stable medical therapy from a cardiac perspective.  Tolerating Crestor at 40 mg daily and her most recent LDL in July was 58.  She has follow-up blood work pending with her PCP as well.  I reviewed the remainder of her medications as noted below.  She is on Glucophage per PCP, could consider the possibility of switching this to an SGLT2 inhibitor for further cardiac event reduction.  Past Medical History:  Diagnosis Date   Arthritis    BCC (basal cell carcinoma of skin) 08/31/1994   RIGHT CLAVICLE INFERIOR   BCC (basal cell carcinoma of skin) 02/21/1994   RIGHT SCAPULA TX CX3 5FU   BCC (basal cell carcinoma of skin) 02/21/1994   RIGHT CLAVICAL TX CX3 5FU   BCC (basal cell carcinoma of skin) 05/25/1994   RIGHT SCAPULA TX =MOHS   BCC (basal cell carcinoma of skin) 02/02/1999   RIGHT FRONT SHOULDER TX CURET X3 ,EXC   BCC (basal cell carcinoma of skin) 12/25/1999   RIGHT UPPER BACK CX3 5FU   BCC (basal cell carcinoma of skin) 03/29/2005   RIGHT LATER LOWER LEG TX MOHS   BCC (basal cell carcinoma of skin) 11/10/2005   LEFT ANT. NECK TX=MOHS   BCC (basal cell carcinoma of skin) 08/26/2018   RIGHT FRONT SHOULDER TX WITH BX   BCC  (basal cell carcinoma) 08/31/1994   RIGHT SUPER CLAVICLE MEDIAL TX CX3 5FU   Breast cancer ([place]) 2009   Left Breast Cancer   CAD (coronary artery disease)    Cataract    Essential hypertension    Hyperlipidemia    Macular retinal cyst of right eye    OSA (obstructive sleep apnea)    CPAP   Osteopenia    Personal history of radiation therapy     Past Surgical History:  Procedure Laterality Date   BREAST EXCISIONAL BIOPSY Right    BREAST LUMPECTOMY Left 06/2008   CATARACT EXTRACTION  2012   COLONOSCOPY     Eyelid surgery Bilateral    INTRAVASCULAR PRESSURE WIRE/FFR STUDY N/A 06/01/2019   Procedure: INTRAVASCULAR PRESSURE WIRE/FFR STUDY;  Surgeon: Belva Crome, MD;  Location: [facility];  Service: Cardiovascular;  Laterality: N/A;   LEFT HEART CATH AND CORONARY ANGIOGRAPHY N/A 06/01/2019   Procedure: LEFT HEART CATH AND CORONARY ANGIOGRAPHY;  Surgeon: Belva Crome, MD;  Location: [facility];  Service: Cardiovascular;  Laterality: N/A;   MASS EXCISION  03/2008   Appendix   SKIN CANCER EXCISION     TONSILLECTOMY      Current Outpatient Medications  Medication Sig Dispense Refill   acetaminophen (TYLENOL) 500 MG tablet acetaminophen 500 mg tablet  Take 2 tablets every 6 hours by oral route as needed.     alendronate (FOSAMAX) 70 MG tablet Take 1 tablet (70 mg total) by mouth every 7 (seven) days. Take with a full glass of water on an empty stomach. 5 tablet 11   aspirin 81 MG tablet Take 81 mg by mouth every evening.     Calcium Carbonate-Vitamin D 600-400 MG-UNIT tablet Take 2 tablets by mouth every evening.      Cholecalciferol (VITAMIN D3) 50 MCG (2000 UT) capsule Take 2,000 Units by mouth every evening.      Coenzyme Q10 (COQ-10) 100 MG CAPS Take 100 mg by mouth daily.      fish oil-omega-3 fatty acids 1000 MG capsule Take 1 g by mouth every evening.      hydrochlorothiazide (HYDRODIURIL) 25 MG tablet Take 1 tablet (25 mg total) by mouth daily. 90 tablet 3    isosorbide mononitrate (IMDUR) 30 MG 24 hr tablet TAKE 1 TABLET(30 MG) BY MOUTH TWICE DAILY 180 tablet 3   metFORMIN (GLUCOPHAGE) 500 MG tablet Take 1 tablet (500 mg total) by mouth 2 (two) times daily with a meal. 90 tablet 3   metoprolol succinate (TOPROL-XL) 50 MG 24 hr tablet Take with or immediately following a meal. 90 tablet 3   Multiple Vitamin (MULTIVITAMIN) tablet Take 1 tablet by mouth every evening.     nitroGLYCERIN (NITROSTAT) 0.4 MG SL tablet ONE TABLET UNDER TONGUE AS NEEDED FOR CHEST PAIN 25 tablet 6   potassium chloride SA (KLOR-CON) 20 MEQ tablet TAKE 1 TABLET BY MOUTH EVERY DAY 90 tablet 3   rosuvastatin (CRESTOR) 40 MG tablet TAKE 1 TABLET(40 MG) BY MOUTH DAILY 90 tablet 3   vitamin C (ASCORBIC ACID) 500 MG tablet Take 500 mg by mouth every evening.      No current facility-administered medications for this visit.   Allergies:  Lisinopril and Statins   ROS: No palpitations or syncope.  Physical Exam: VS:  BP 132/80   Pulse 74   Resp 18   Ht 5' 6.5" (1.689 m)   Wt 173 lb (78.5 kg)   LMP  (LMP Unknown)   SpO2 98%   BMI 27.50 kg/m , BMI Body mass index is 27.5 kg/m.  Wt Readings from Last 3 Encounters:  08/01/21 173 lb (78.5 kg)  04/25/21 167 lb (75.8 kg)  01/31/21 173 lb (78.5 kg)    General: Patient appears comfortable at rest. HEENT: Conjunctiva and lids normal, wearing a mask. Neck: Supple, no elevated JVP or carotid bruits, no thyromegaly. Lungs: Clear to auscultation, nonlabored breathing at rest. Cardiac: Regular rate and rhythm, no S3 or significant systolic murmur, no pericardial rub. Extremities: No pitting edema.  ECG:  An ECG dated 12/13/2020 was personally reviewed today and demonstrated:  Sinus rhythm.  Recent Labwork: 01/31/2021: ALT 26; AST 24; BUN 16; Creatinine, Ser 0.65; Hemoglobin 14.3; Platelets 218.0; Potassium 4.6; Sodium 140     Component Value Date/Time   CHOL 118 01/31/2021 1123   CHOL 169 01/14/2017 0835   TRIG 109.0 01/31/2021  1123   HDL 38.40 (L) 01/31/2021 1123   HDL 34 (L) 01/14/2017 0835   CHOLHDL 3 01/31/2021 1123   VLDL 21.8 01/31/2021 1123   LDLCALC 58 01/31/2021 1123   LDLCALC 90 01/14/2017 0835   LDLDIRECT 67 08/01/2020 1147    Other Studies Reviewed Today:  Cardiac catheterization 06/01/2019: Right dominant coronary anatomy. Moderate to heavy LAD and circumflex calcification. Widely patent left main. The proximal to  mid LAD is heavily calcified and contains segmental 50% tubular narrowing over approximately 20 mm.  The mid LAD contains relatively focal calcified 65 to 75% stenosis.  The LAD beyond this segment is widely patent.  The first diagonal contains 60% ostial narrowing and arises from the calcified tubular stenosis. Both DFR (0.9) and FFR (0.82) Circumflex coronary artery contains no significant obstruction.  The second obtuse marginal contains 50 to 60% ostial narrowing. RCA is dominant with proximal 30 to 40% narrowing and mid 25% narrowing. Normal left ventricular size and function.  Assessment and Plan:  1.  Moderate CAD as outlined above with plan to continue medical therapy in the absence of progressive angina symptoms.  Continue aspirin, Toprol-XL, Imdur, and Crestor.  2.  Prediabetes, following with PCP and now on Glucophage.  Could consider switching to SGLT2 inhibitor for additional cardiac risk reduction.  3.  Mixed hyperlipidemia, tolerating high-dose Crestor with last LDL 58.  Medication Adjustments/Labs and Tests Ordered: Current medicines are reviewed at length with the patient today.  Concerns regarding medicines are outlined above.   Tests Ordered: No orders of the defined types were placed in this encounter.   Medication Changes: No orders of the defined types were placed in this encounter.   Disposition:  Follow up  6 months.  Signed, Satira Sark, MD, [facility] 08/01/2021 3:38 PM    [facility] [place], [place],  [place] [address] Phone: [phone]; Fax: [phone]

## 2021-08-01 NOTE — Patient Instructions (Addendum)

## 2021-08-03 ENCOUNTER — Encounter: Payer: Self-pay | Admitting: Cardiology

## 2021-08-03 ENCOUNTER — Encounter: Payer: Self-pay | Admitting: Family Medicine

## 2021-08-04 ENCOUNTER — Encounter: Payer: Self-pay | Admitting: Family Medicine

## 2021-08-04 DIAGNOSIS — R051 Acute cough: Secondary | ICD-10-CM | POA: Diagnosis not present

## 2021-08-04 DIAGNOSIS — R070 Pain in throat: Secondary | ICD-10-CM | POA: Diagnosis not present

## 2021-08-04 DIAGNOSIS — U071 COVID-19: Secondary | ICD-10-CM | POA: Diagnosis not present

## 2021-08-07 ENCOUNTER — Telehealth: Payer: Self-pay

## 2021-08-07 ENCOUNTER — Encounter: Payer: Self-pay | Admitting: Pulmonary Disease

## 2021-08-07 NOTE — Telephone Encounter (Signed)
Patient declined visit as she was seen at [facility] and was prescribed medication.   Nurse Assessment Nurse: Lynnette Caffey, RN, Amber Date/Time (Eastern Time): 08/04/2021 11:07:40 AM Confirm and document reason for call. If symptomatic, describe symptoms. ---Caller states she tested positive for Covid last night. symptoms include sore throat, slight headache and dry cough. Denies fever.  Does the patient have any new or worsening symptoms? ---Yes Will a triage be completed? ---Yes Related visit to physician within the last 2 weeks? ---No Does the PT have any chronic conditions? (i.e. diabetes, asthma, this includes High risk factors for pregnancy, etc.) ---Yes List chronic conditions. ---cardiac (heart blockage), HTN, angina, pre-diabetes, cholesterol Is this a behavioral health or substance abuse call? ---No  COVID-19 - Diagnosed or Suspected [1] HIGH RISK for severe COVID complications (e.g., weak immune system, age > 30 years, obesity with BMI 30 or higher, pregnant, chronic lung disease or other chronic medical condition)  [2] COVID symptoms (e.g., cough, fever) (Exceptions: Already seen by PCP and no new or worsening symptoms.) Morris, RN, [employer] 08/04/2021 11:10:09 AM  Advised caller that OCP recommends she go to UC so they can start her on antivirals or any other meds she may need. Recommends [facility] but any UC that is convenient for caller is fine. Caller verbalized understanding. Paging Doctor Horace Porteous [phone] 08/04/2021 11:17:57 AM Called On Call Provider - Reached Doctor Paged Horace Porteous 08/04/2021 11:19:27 AM Spoke with On Call - General Message Result Spoke with Dr. Nani Ravens. States he recommends caller go to UC so they can start her on antivirals

## 2021-08-07 NOTE — Telephone Encounter (Signed)
VS please advise. Thanks! 

## 2021-08-07 NOTE — Telephone Encounter (Signed)
Thank you for letting me know.  We both had masks on, so hopefully not of major concern.  I hope you are doing okay.

## 2021-08-08 ENCOUNTER — Encounter: Payer: Self-pay | Admitting: Pulmonary Disease

## 2021-08-08 NOTE — Telephone Encounter (Signed)
I have printed and signed a letter and given to [place].

## 2021-08-09 NOTE — Telephone Encounter (Signed)
Routing to Cecilia for documentation.

## 2021-08-13 ENCOUNTER — Encounter: Payer: Self-pay | Admitting: Family Medicine

## 2021-08-24 ENCOUNTER — Ambulatory Visit (INDEPENDENT_AMBULATORY_CARE_PROVIDER_SITE_OTHER): Payer: Medicare PPO | Admitting: Family Medicine

## 2021-08-24 ENCOUNTER — Other Ambulatory Visit: Payer: Self-pay

## 2021-08-24 ENCOUNTER — Encounter: Payer: Self-pay | Admitting: Family Medicine

## 2021-08-24 VITALS — BP 138/80 | HR 89 | Temp 97.2°F | Wt 172.2 lb

## 2021-08-24 DIAGNOSIS — G4733 Obstructive sleep apnea (adult) (pediatric): Secondary | ICD-10-CM | POA: Diagnosis not present

## 2021-08-24 DIAGNOSIS — I10 Essential (primary) hypertension: Secondary | ICD-10-CM | POA: Diagnosis not present

## 2021-08-24 DIAGNOSIS — R739 Hyperglycemia, unspecified: Secondary | ICD-10-CM

## 2021-08-24 DIAGNOSIS — Z853 Personal history of malignant neoplasm of breast: Secondary | ICD-10-CM | POA: Diagnosis not present

## 2021-08-24 DIAGNOSIS — E559 Vitamin D deficiency, unspecified: Secondary | ICD-10-CM

## 2021-08-24 DIAGNOSIS — Z0000 Encounter for general adult medical examination without abnormal findings: Secondary | ICD-10-CM

## 2021-08-24 DIAGNOSIS — E785 Hyperlipidemia, unspecified: Secondary | ICD-10-CM | POA: Diagnosis not present

## 2021-08-24 DIAGNOSIS — Z1211 Encounter for screening for malignant neoplasm of colon: Secondary | ICD-10-CM

## 2021-08-24 LAB — CBC WITH DIFFERENTIAL/PLATELET
Basophils Absolute: 0 10*3/uL (ref 0.0–0.1)
Basophils Relative: 0.4 % (ref 0.0–3.0)
Eosinophils Absolute: 0.2 10*3/uL (ref 0.0–0.7)
Eosinophils Relative: 2 % (ref 0.0–5.0)
HCT: 39.2 % (ref 36.0–46.0)
Hemoglobin: 13.4 g/dL (ref 12.0–15.0)
Lymphocytes Relative: 29.5 % (ref 12.0–46.0)
Lymphs Abs: 2.3 10*3/uL (ref 0.7–4.0)
MCHC: 34.2 g/dL (ref 30.0–36.0)
MCV: 93.2 fl (ref 78.0–100.0)
Monocytes Absolute: 0.7 10*3/uL (ref 0.1–1.0)
Monocytes Relative: 9 % (ref 3.0–12.0)
Neutro Abs: 4.6 10*3/uL (ref 1.4–7.7)
Neutrophils Relative %: 59.1 % (ref 43.0–77.0)
Platelets: 228 10*3/uL (ref 150.0–400.0)
RBC: 4.2 Mil/uL (ref 3.87–5.11)
RDW: 12.5 % (ref 11.5–15.5)
WBC: 7.7 10*3/uL (ref 4.0–10.5)

## 2021-08-24 LAB — HEMOGLOBIN A1C: Hgb A1c MFr Bld: 6.1 % (ref 4.6–6.5)

## 2021-08-24 MED ORDER — METOPROLOL SUCCINATE ER 50 MG PO TB24: ORAL_TABLET | ORAL | 3 refills | Status: DC

## 2021-08-24 MED ORDER — HYDROCHLOROTHIAZIDE 25 MG PO TABS: 25.0000 mg | ORAL_TABLET | Freq: Every day | ORAL | 3 refills | Status: DC

## 2021-08-24 MED ORDER — METFORMIN HCL 500 MG PO TABS: 500.0000 mg | ORAL_TABLET | Freq: Two times a day (BID) | ORAL | 3 refills | Status: DC

## 2021-08-24 NOTE — Progress Notes (Signed)
Phone [phone]   Subjective:  Patient presents today for their annual physical. Chief complaint-noted.   See problem oriented charting- ROS- full  review of systems was completed and negative except for: hearing loss, twinges under left arm nonexertional- cardiology aware and not concerned, stiff joints  The following were reviewed and entered/updated in epic: Past Medical History:  Diagnosis Date   Arthritis    BCC (basal cell carcinoma of skin) 08/31/1994   RIGHT CLAVICLE INFERIOR   BCC (basal cell carcinoma of skin) 02/21/1994   RIGHT SCAPULA TX CX3 5FU   BCC (basal cell carcinoma of skin) 02/21/1994   RIGHT CLAVICAL TX CX3 5FU   BCC (basal cell carcinoma of skin) 05/25/1994   RIGHT SCAPULA TX =MOHS   BCC (basal cell carcinoma of skin) 02/02/1999   RIGHT FRONT SHOULDER TX CURET X3 ,EXC   BCC (basal cell carcinoma of skin) 12/25/1999   RIGHT UPPER BACK CX3 5FU   BCC (basal cell carcinoma of skin) 03/29/2005   RIGHT LATER LOWER LEG TX MOHS   BCC (basal cell carcinoma of skin) 11/10/2005   LEFT ANT. NECK TX=MOHS   BCC (basal cell carcinoma of skin) 08/26/2018   RIGHT FRONT SHOULDER TX WITH BX   BCC (basal cell carcinoma) 08/31/1994   RIGHT SUPER CLAVICLE MEDIAL TX CX3 5FU   Breast cancer ([place]) 2009   Left Breast Cancer   CAD (coronary artery disease)    Cataract    Essential hypertension    Hyperlipidemia    Macular retinal cyst of right eye    OSA (obstructive sleep apnea)    CPAP   Osteopenia    Personal history of radiation therapy    Patient Active Problem List   Diagnosis Date Noted   CAD (coronary artery disease) 07/24/2019    Priority: High   History of breast cancer 03/20/2011    Priority: High   OSA (obstructive sleep apnea) 08/24/2015    Priority: Medium    Hyperglycemia 11/12/2014    Priority: Medium    Osteoporosis-due to prior treatment with Prolia/Fosamax 06/04/2014    Priority: Medium    HTN (hypertension) 07/13/2008    Priority: Medium     Hyperlipidemia 07/09/2007    Priority: Medium    History of skin cancer 02/21/2016    Priority: Low   Chest discomfort 10/05/2014    Priority: Low   Vitamin D deficiency 07/09/2007    Priority: Low   Dysuria 07/17/2021   Cystitis 07/17/2021   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   Type 2 macular telangiectasis, 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   Nuclear sclerotic cataract of left eye 03/08/2020   Numbness and tingling of both feet 11/10/2019   Neuropathy 11/10/2019   History of colon polyps 06/12/2018   Cortical age-related cataract of left eye 09/14/2015   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 09/14/2015   Cataract 07/16/2012   Macular retinal cyst 07/16/2012   Pseudophakia of right eye 07/16/2012   Malignant neoplasm of breast (female) ([place]) 03/20/2011   Past Surgical History:  Procedure Laterality Date   BREAST EXCISIONAL BIOPSY Right    BREAST LUMPECTOMY Left 06/2008   CATARACT EXTRACTION  2012   COLONOSCOPY     Eyelid surgery Bilateral    INTRAVASCULAR PRESSURE WIRE/FFR STUDY N/A 06/01/2019   Procedure: INTRAVASCULAR PRESSURE WIRE/FFR STUDY;  Surgeon: Belva Crome, MD;  Location: [facility];  Service: Cardiovascular;  Laterality: N/A;   LEFT HEART CATH AND CORONARY ANGIOGRAPHY  N/A 06/01/2019   Procedure: LEFT HEART CATH AND CORONARY ANGIOGRAPHY;  Surgeon: Belva Crome, MD;  Location: [facility];  Service: Cardiovascular;  Laterality: N/A;   MASS EXCISION  03/2008   Appendix   SKIN CANCER EXCISION     TONSILLECTOMY      Family History  Problem Relation Age of Onset   Heart attack Mother    Hypertension Mother    Heart disease Father    Heart attack Father    Hypertension Father    Colon cancer Neg Hx    Stroke Neg Hx    Esophageal cancer Neg Hx    Rectal cancer Neg Hx    Stomach cancer Neg Hx     Medications- reviewed and updated Current Outpatient Medications  Medication Sig Dispense Refill    acetaminophen (TYLENOL) 500 MG tablet acetaminophen 500 mg tablet  Take 2 tablets every 6 hours by oral route as needed.     alendronate (FOSAMAX) 70 MG tablet Take 1 tablet (70 mg total) by mouth every 7 (seven) days. Take with a full glass of water on an empty stomach. 5 tablet 11   aspirin 81 MG tablet Take 81 mg by mouth every evening.     Calcium Carbonate-Vitamin D 600-400 MG-UNIT tablet Take 2 tablets by mouth every evening.      Cholecalciferol (VITAMIN D3) 50 MCG (2000 UT) capsule Take 2,000 Units by mouth every evening.      Coenzyme Q10 (COQ-10) 100 MG CAPS Take 100 mg by mouth daily.      fish oil-omega-3 fatty acids 1000 MG capsule Take 1 g by mouth every evening.      isosorbide mononitrate (IMDUR) 30 MG 24 hr tablet TAKE 1 TABLET(30 MG) BY MOUTH TWICE DAILY 180 tablet 3   Multiple Vitamin (MULTIVITAMIN) tablet Take 1 tablet by mouth every evening.     nitroGLYCERIN (NITROSTAT) 0.4 MG SL tablet ONE TABLET UNDER TONGUE AS NEEDED FOR CHEST PAIN 25 tablet 6   potassium chloride SA (KLOR-CON) 20 MEQ tablet TAKE 1 TABLET BY MOUTH EVERY DAY 90 tablet 3   rosuvastatin (CRESTOR) 40 MG tablet TAKE 1 TABLET(40 MG) BY MOUTH DAILY 90 tablet 3   vitamin C (ASCORBIC ACID) 500 MG tablet Take 500 mg by mouth every evening.      hydrochlorothiazide (HYDRODIURIL) 25 MG tablet Take 1 tablet (25 mg total) by mouth daily. 90 tablet 3   metFORMIN (GLUCOPHAGE) 500 MG tablet Take 1 tablet (500 mg total) by mouth 2 (two) times daily with a meal. 180 tablet 3   metoprolol succinate (TOPROL-XL) 50 MG 24 hr tablet Take with or immediately following a meal. 90 tablet 3   No current facility-administered medications for this visit.    Allergies-reviewed and updated Allergies  Allergen Reactions   Lisinopril Cough   Statins Other (See Comments)    Myalgia with lipitor- unproven She is able to tolerate simvastatin    Social History   Social History Narrative   Married. 2 daughters. 4 grandkids  (teenagers in [place] and then two 3 and under in 2021)      Retired from teaching special ed mostly [place]: travel, time with friends, eating out, lots of grandkids time in [place]   Objective  Objective:  BP 138/80 (BP Location: Left Arm, Patient Position: Sitting, Cuff Size: Normal)    Pulse 89    Temp (!) 97.2 F (36.2 C) (Oral)    Wt  172 lb 3.2 oz (78.1 kg)    LMP  (LMP Unknown)    SpO2 (!) 89%    BMI 27.38 kg/m  Gen: NAD, resting comfortably HEENT: Mucous membranes are moist. Oropharynx normal Neck: no thyromegaly CV: RRR no murmurs rubs or gallops Lungs: CTAB no crackles, wheeze, rhonchi Abdomen: soft/nontender/nondistended/normal bowel sounds. No rebound or guarding.  Ext: no edema Skin: warm, dry Neuro: grossly normal, moves all extremities, PERRLA Breasts: normal appearance, no masses or tenderness other than lumpectomy scar on left and radiation dot    Assessment and Plan   75 y.o. female presenting for annual physical.  Health Maintenance counseling: 1. Anticipatory guidance: Patient counseled regarding regular dental exams --q6 months, eye exams -yearly or more frequently,  avoiding smoking and second hand smoke , limiting alcohol to 1 beverage per day, no illicit drugs .   2. Risk factor reduction:  Advised patient of need for regular exercise and diet rich and fruits and vegetables to reduce risk of heart attack and stroke.  Exercise- last physical she was very limited by plantar fascitis,, back to walking and doing 3x a week.  Diet/weight management-cooks at home/eating reasonably healthy diet.  Unfortunately weight up 7 pounds from last year- she plans on caloric restriction  Wt Readings from Last 3 Encounters:  08/24/21 172 lb 3.2 oz (78.1 kg)  08/01/21 173 lb (78.5 kg)  04/25/21 167 lb (75.8 kg)  3. Immunizations/screenings/ancillary studies-discuss Omicron/Bivalent- patient recently had COVID plus appears she had bivalent vaccination on 05/30/2021- not  indicated- otherwise immunizations are up-to-date. Immunization History  Administered Date(s) Administered   Fluad Quad(high Dose 65+) 06/12/2019, 05/05/2020   Hep A / Hep B 03/16/2013, 10/06/2013   Hepatitis B, adult 04/17/2013   Influenza Split 06/09/2012   Influenza Whole 05/22/2011   Influenza,inj,Quad PF,6+ Mos 05/27/2013   Influenza-Unspecified 06/10/2014, 05/24/2015, 04/24/2016, 04/26/2017, 05/11/2018, 06/06/2021   Moderna SARS-COV2 Booster Vaccination 12/08/2020, 05/30/2021   Moderna Sars-Covid-2 Vaccination 10/13/2019, 11/12/2019, 07/05/2020   Pneumococcal Conjugate-13 11/12/2014   Pneumococcal Polysaccharide-23 08/24/2008, 07/17/2013   Td 04/19/2010   Tdap 04/05/2016   Zoster Recombinat (Shingrix) 07/01/2017, 09/20/2017   Zoster, Live 11/17/2008  4. Cervical cancer screening- past age based screening recommendations- stopped seeing GYN 5. Breast cancer screening-  breast exam - opts out in office- and mammogram done 08/24/2020 and yearlyb-scheduled soon 6. Colon cancer screening - last colonoscopy done 06/05/2018 with a 3-year repeat recommended-referral was placed today 7. Skin cancer screening-follows with Dr. Denna Haggard annually. advised regular sunscreen use. Denies worrisome, changing, or new skin lesions.  8. Birth control/STD check- postmenopausal/only active with husband 72. Osteoporosis screening at 15- see below 10. Smoking associated screening - never smoker  Status of chronic or acute concerns   #COVID-19 Infection Update- Patient sent a message through [place] stating she had tested positive COVID on 08/01/2021. Reported to Dr. Domenic Polite at cardiology that she had been seen by urgent care and was prescribed Lagervio -she reports has recovered well   #History of breast cancer-status post lumpectomy and radiation therapy in 2009. Completed Arimidex December 2014 after initially starting on tamoxifen. Continues yearly mammograms-scheduled 08/28/2021  #Coronary artery  disease of native artery of native heart with stable angina pectoris - follows with Dr. Domenic Polite #hyperlipidemia S: Medication: aspirin 81 mg, imdur 30 mg, nitroglycerin prn - has it but only uses when needed- not needing in general- used once in last month but also took tums - not sure if was indigestion, rosuvastatin 40 mg daily Lab Results  Component  Value Date   CHOL 118 01/31/2021   HDL 38.40 (L) 01/31/2021   LDLCALC 58 01/31/2021   LDLDIRECT 67 08/01/2020   TRIG 109.0 01/31/2021   CHOLHDL 3 01/31/2021  A/P: CAD with stable angina on Imdur-continue current medication Hyperlipidemia has been at goal with LDL under 70-continue current medication  #hypertension S: medication:  hctz 25mg  (also on potassium) daily, imdur 30 mg BID, metoprolol 50 mg XR Home readings #s: reasonable control 952W at home systolic BP Readings from Last 3 Encounters:  08/24/21 138/80  08/01/21 132/80  04/25/21 122/68  A/P:  Controlled. Continue current medications.    # Hyperglycemia/insulin resistance/prediabetes- peak a1c of 6.0 July 2019 S: Medication: Metformin 500 mg twice daily Exercise- Last physical patient reported she had not exercise in about 6 months but was trying to walk more- has increased Diet- see above Lab Results  Component Value Date   HGBA1C 6.2 01/31/2021   HGBA1C 5.9 (H) 08/01/2020   HGBA1C 5.9 01/26/2020   A/P: A1c was trending up last visit and we increased her metformin-update A1c today and hopeful for stability or improvement   #Vitamin D deficiency/osteoporosis- on prolia with last dose September 15, 2020-has been on 5 years of therapy S: Medication:  2000 units daily vitamin D, patient was started on alendronate for 1 year post Prolia -I reached out to Dr. Delton Coombes about alendronate for 1 year after finishing prolia and physican was on board A/P: Osteoporosis hopefully stable-has upcoming bone density scheduled on the 19th.  For now continue current therapy  #OSA with  CPAP- follows with Dr. Halford Chessman- patient was seen 04/25/2021 where her CPAP machine was replaced with a Resmed 11 auto CPAP with range 5 to 15 cm H2O - she is compliant with the new machine  Recommended follow up: Return in about 6 months (around 02/22/2022) for follow up- or sooner if needed. Future Appointments  Date Time Provider [facility]  08/28/2021  9:30 AM AP-DG DEXA AP-DG [place] [facility]  08/28/2021 10:00 AM AP-MM 1 AP-MM [facility]  09/13/2021  9:00 AM AP-ACAPA LAB AP-ACAPA None  09/20/2021  2:45 PM Derek Jack, MD AP-ACAPA None  09/22/2021 10:15 AM [facility] HEALTH COACH LB[facility]PC PEC  11/01/2021 10:45 AM [name], MD CD-GSO CDGSO  11/07/2021 10:00 AM Rankin, Clent Demark, MD RDE-RDE None   Lab/Order associations: fasting   ICD-10-CM   1. Preventative health care  Z00.00     2. Hyperlipidemia, unspecified hyperlipidemia type  E78.5 CBC with Differential/Platelet    Comprehensive metabolic panel    LDL cholesterol, direct    3. Primary hypertension  I10     4. Vitamin D deficiency  E55.9 VITAMIN D 25 Hydroxy (Vit-D Deficiency, Fractures)    5. OSA (obstructive sleep apnea)  G47.33     6. Hyperglycemia  R73.9 Hemoglobin A1c    7. History of breast cancer  Z85.3     8. Screen for colon cancer  Z12.11 Ambulatory referral to Gastroenterology     Meds ordered this encounter  Medications   hydrochlorothiazide (HYDRODIURIL) 25 MG tablet    Sig: Take 1 tablet (25 mg total) by mouth daily.    Dispense:  90 tablet    Refill:  3   metoprolol succinate (TOPROL-XL) 50 MG 24 hr tablet    Sig: Take with or immediately following a meal.    Dispense:  90 tablet    Refill:  3   metFORMIN (GLUCOPHAGE) 500 MG tablet    Sig: Take 1  tablet (500 mg total) by mouth 2 (two) times daily with a meal.    Dispense:  180 tablet    Refill:  3    I,Harris Phan,acting as a scribe for Garret Reddish, MD.,have documented all relevant documentation on the behalf of Garret Reddish, MD,as  directed by  Garret Reddish, MD while in the presence of Garret Reddish, MD.    I, Garret Reddish, MD, have reviewed all documentation for this visit. The documentation on 08/24/21 for the exam, diagnosis, procedures, and orders are all accurate and complete.   Return precautions advised.  Garret Reddish, MD

## 2021-08-24 NOTE — Patient Instructions (Addendum)
Health Maintenance Due  Topic Date Due   COLONOSCOPY  Please call  [facility]  Address: [address], [place], [place] [address] Phone: [phone]  06/05/2021   Please stop by lab before you go If you have mychart- we will send your results within 3 business days of [place] receiving them.  If you do not have mychart- we will call you about results within 5 business days of [place] receiving them.  *please also note that you will see labs on mychart as soon as they post. I will later go in and write notes on them- will say "notes from Dr. Yong Channel"  Recommended follow up: Return in about 6 months (around 02/22/2022) for follow up- or sooner if needed.

## 2021-08-25 LAB — COMPREHENSIVE METABOLIC PANEL
ALT: 32 U/L (ref 0–35)
AST: 30 U/L (ref 0–37)
Albumin: 4.4 g/dL (ref 3.5–5.2)
Alkaline Phosphatase: 55 U/L (ref 39–117)
BUN: 14 mg/dL (ref 6–23)
CO2: 31 mEq/L (ref 19–32)
Calcium: 10.1 mg/dL (ref 8.4–10.5)
Chloride: 104 mEq/L (ref 96–112)
Creatinine, Ser: 0.67 mg/dL (ref 0.40–1.20)
GFR: 85.95 mL/min (ref >60.00)
Glucose, Bld: 92 mg/dL (ref 70–99)
Potassium: 4.8 mEq/L (ref 3.5–5.1)
Sodium: 141 mEq/L (ref 135–145)
Total Bilirubin: 0.6 mg/dL (ref 0.2–1.2)
Total Protein: 6.7 g/dL (ref 6.0–8.3)

## 2021-08-25 LAB — LDL CHOLESTEROL, DIRECT: Direct LDL: 68 mg/dL

## 2021-08-25 LAB — VITAMIN D 25 HYDROXY (VIT D DEFICIENCY, FRACTURES): VITD: 50.47 ng/mL (ref 30.00–100.00)

## 2021-08-28 ENCOUNTER — Ambulatory Visit (HOSPITAL_COMMUNITY)
Admission: RE | Admit: 2021-08-28 | Discharge: 2021-08-28 | Disposition: A | Discharge status: Home / Self Care | Payer: Medicare PPO | Source: Ambulatory Visit | Attending: Hematology | Admitting: Hematology

## 2021-08-28 ENCOUNTER — Other Ambulatory Visit: Payer: Self-pay

## 2021-08-28 DIAGNOSIS — Z13820 Encounter for screening for osteoporosis: Secondary | ICD-10-CM | POA: Insufficient documentation

## 2021-08-28 DIAGNOSIS — Z780 Asymptomatic menopausal state: Secondary | ICD-10-CM | POA: Insufficient documentation

## 2021-08-28 DIAGNOSIS — Z1231 Encounter for screening mammogram for malignant neoplasm of breast: Secondary | ICD-10-CM | POA: Insufficient documentation

## 2021-08-28 DIAGNOSIS — M8589 Other specified disorders of bone density and structure, multiple sites: Secondary | ICD-10-CM | POA: Diagnosis not present

## 2021-08-28 DIAGNOSIS — Z853 Personal history of malignant neoplasm of breast: Secondary | ICD-10-CM | POA: Diagnosis not present

## 2021-08-28 DIAGNOSIS — Z170 Estrogen receptor positive status [ER+]: Secondary | ICD-10-CM

## 2021-08-31 DIAGNOSIS — G4733 Obstructive sleep apnea (adult) (pediatric): Secondary | ICD-10-CM | POA: Diagnosis not present

## 2021-09-09 DIAGNOSIS — G4733 Obstructive sleep apnea (adult) (pediatric): Secondary | ICD-10-CM | POA: Diagnosis not present

## 2021-09-13 ENCOUNTER — Inpatient Hospital Stay (HOSPITAL_COMMUNITY): Payer: Medicare PPO | Attending: Hematology

## 2021-09-13 ENCOUNTER — Other Ambulatory Visit: Payer: Self-pay

## 2021-09-13 ENCOUNTER — Other Ambulatory Visit (HOSPITAL_COMMUNITY): Payer: Medicare PPO

## 2021-09-13 DIAGNOSIS — C50412 Malignant neoplasm of upper-outer quadrant of left female breast: Secondary | ICD-10-CM

## 2021-09-13 DIAGNOSIS — M8580 Other specified disorders of bone density and structure, unspecified site: Secondary | ICD-10-CM | POA: Diagnosis not present

## 2021-09-13 DIAGNOSIS — C50912 Malignant neoplasm of unspecified site of left female breast: Secondary | ICD-10-CM | POA: Insufficient documentation

## 2021-09-13 DIAGNOSIS — Z170 Estrogen receptor positive status [ER+]: Secondary | ICD-10-CM

## 2021-09-13 LAB — COMPREHENSIVE METABOLIC PANEL
ALT: 39 U/L (ref 0–44)
AST: 35 U/L (ref 15–41)
Albumin: 4.3 g/dL (ref 3.5–5.0)
Alkaline Phosphatase: 54 U/L (ref 38–126)
Anion gap: 8 (ref 5–15)
BUN: 16 mg/dL (ref 8–23)
CO2: 28 mmol/L (ref 22–32)
Calcium: 9.7 mg/dL (ref 8.9–10.3)
Chloride: 103 mmol/L (ref 98–111)
Creatinine, Ser: 0.56 mg/dL (ref 0.44–1.00)
GFR, Estimated: >60 mL/min (ref >60)
Glucose, Bld: 105 mg/dL — ABNORMAL HIGH (ref 70–99)
Potassium: 3.7 mmol/L (ref 3.5–5.1)
Sodium: 139 mmol/L (ref 135–145)
Total Bilirubin: 0.8 mg/dL (ref 0.3–1.2)
Total Protein: 7.2 g/dL (ref 6.5–8.1)

## 2021-09-13 LAB — CBC WITH DIFFERENTIAL/PLATELET
Abs Immature Granulocytes: 0.03 10*3/uL (ref 0.00–0.07)
Basophils Absolute: 0 10*3/uL (ref 0.0–0.1)
Basophils Relative: 0 %
Eosinophils Absolute: 0.1 10*3/uL (ref 0.0–0.5)
Eosinophils Relative: 2 %
HCT: 41.8 % (ref 36.0–46.0)
Hemoglobin: 14.2 g/dL (ref 12.0–15.0)
Immature Granulocytes: 0 %
Lymphocytes Relative: 29 %
Lymphs Abs: 2.1 10*3/uL (ref 0.7–4.0)
MCH: 32.7 pg (ref 26.0–34.0)
MCHC: 34 g/dL (ref 30.0–36.0)
MCV: 96.3 fL (ref 80.0–100.0)
Monocytes Absolute: 0.7 10*3/uL (ref 0.1–1.0)
Monocytes Relative: 10 %
Neutro Abs: 4.2 10*3/uL (ref 1.7–7.7)
Neutrophils Relative %: 59 %
Platelets: 231 10*3/uL (ref 150–400)
RBC: 4.34 MIL/uL (ref 3.87–5.11)
RDW: 11.7 % (ref 11.5–15.5)
WBC: 7.1 10*3/uL (ref 4.0–10.5)
nRBC: 0 % (ref 0.0–0.2)

## 2021-09-13 LAB — VITAMIN D 25 HYDROXY (VIT D DEFICIENCY, FRACTURES): Vit D, 25-Hydroxy: 33.91 ng/mL (ref 30–100)

## 2021-09-20 ENCOUNTER — Encounter: Payer: Self-pay | Admitting: Family Medicine

## 2021-09-20 ENCOUNTER — Other Ambulatory Visit (HOSPITAL_COMMUNITY): Payer: Self-pay | Admitting: Hematology

## 2021-09-20 ENCOUNTER — Inpatient Hospital Stay (HOSPITAL_COMMUNITY): Payer: Medicare PPO | Admitting: Hematology

## 2021-09-20 ENCOUNTER — Other Ambulatory Visit: Payer: Self-pay

## 2021-09-20 VITALS — BP 124/72 | HR 63 | Temp 98.0°F | Resp 18 | Ht 65.35 in | Wt 170.0 lb

## 2021-09-20 DIAGNOSIS — Z170 Estrogen receptor positive status [ER+]: Secondary | ICD-10-CM

## 2021-09-20 DIAGNOSIS — C50412 Malignant neoplasm of upper-outer quadrant of left female breast: Secondary | ICD-10-CM

## 2021-09-20 DIAGNOSIS — Z853 Personal history of malignant neoplasm of breast: Secondary | ICD-10-CM | POA: Diagnosis not present

## 2021-09-20 DIAGNOSIS — M8580 Other specified disorders of bone density and structure, unspecified site: Secondary | ICD-10-CM

## 2021-09-20 DIAGNOSIS — C50912 Malignant neoplasm of unspecified site of left female breast: Secondary | ICD-10-CM | POA: Diagnosis not present

## 2021-09-20 NOTE — Progress Notes (Signed)
[facility] [address] [place] [address]   Patient Care Team: Marin Olp, MD as PCP - General (Family Medicine) Satira Sark, MD as PCP - Cardiology (Cardiology) Jovita Kussmaul, MD as Consulting Physician (General Surgery) Thea Silversmith, MD as Consulting Physician (Radiation Oncology) Inda Castle, MD (Inactive) as Consulting Physician (Gastroenterology) Danice Goltz, MD as Consulting Physician (Ophthalmology) Zadie Rhine Clent Demark, MD as Consulting Physician (Ophthalmology) Chesley Mires, MD as Consulting Physician (Pulmonary Disease) Derek Jack, MD as Consulting Physician (Hematology) Gardiner Barefoot, DPM as Consulting Physician (Podiatry) [name], MD as Consulting Physician (Dermatology)  SUMMARY OF ONCOLOGIC HISTORY: Oncology History   No history exists.    CHIEF COMPLIANT: Follow-up for left breast cancer   INTERVAL HISTORY: Angela [name] is a 76 y.o. female here today for follow up of her left breast cancer. Her last visit was on 09/15/2020.   Today she reports feeling good. She continues to take fosamax. She denies history of fractures.   REVIEW OF SYSTEMS:   Review of Systems  Constitutional:  Negative for appetite change and fatigue.  Neurological:  Positive for numbness (feet).  All other systems reviewed and are negative.  I have reviewed the past medical history, past surgical history, social history and family history with the patient and they are unchanged from previous note.   ALLERGIES:   is allergic to lisinopril and statins.   MEDICATIONS:  Current Outpatient Medications  Medication Sig Dispense Refill   acetaminophen (TYLENOL) 500 MG tablet acetaminophen 500 mg tablet  Take 2 tablets every 6 hours by oral route as needed.     alendronate (FOSAMAX) 70 MG tablet Take 1 tablet (70 mg total) by mouth every 7 (seven) days. Take with a full glass of water on an empty stomach. 5 tablet 11    aspirin 81 MG tablet Take 81 mg by mouth every evening.     Calcium Carbonate-Vitamin D 600-400 MG-UNIT tablet Take 2 tablets by mouth every evening.      Cholecalciferol (VITAMIN D3) 50 MCG (2000 UT) capsule Take 2,000 Units by mouth every evening.      Coenzyme Q10 (COQ-10) 100 MG CAPS Take 100 mg by mouth daily.      fish oil-omega-3 fatty acids 1000 MG capsule Take 1 g by mouth every evening.      hydrochlorothiazide (HYDRODIURIL) 25 MG tablet Take 1 tablet (25 mg total) by mouth daily. 90 tablet 3   isosorbide mononitrate (IMDUR) 30 MG 24 hr tablet TAKE 1 TABLET(30 MG) BY MOUTH TWICE DAILY 180 tablet 3   metFORMIN (GLUCOPHAGE) 500 MG tablet Take 1 tablet (500 mg total) by mouth 2 (two) times daily with a meal. 180 tablet 3   metoprolol succinate (TOPROL-XL) 50 MG 24 hr tablet Take with or immediately following a meal. 90 tablet 3   Multiple Vitamin (MULTIVITAMIN) tablet Take 1 tablet by mouth every evening.     nitroGLYCERIN (NITROSTAT) 0.4 MG SL tablet ONE TABLET UNDER TONGUE AS NEEDED FOR CHEST PAIN 25 tablet 6   potassium chloride SA (KLOR-CON) 20 MEQ tablet TAKE 1 TABLET BY MOUTH EVERY DAY 90 tablet 3   rosuvastatin (CRESTOR) 40 MG tablet TAKE 1 TABLET(40 MG) BY MOUTH DAILY 90 tablet 3   vitamin C (ASCORBIC ACID) 500 MG tablet Take 500 mg by mouth every evening.      No current facility-administered medications for this visit.     PHYSICAL EXAMINATION: Performance status (ECOG): 1 -  Symptomatic but completely ambulatory  Vitals:   09/20/21 1408  BP: 124/72  Pulse: 63  Resp: 18  Temp: 98 F (36.7 C)  SpO2: 97%   Wt Readings from Last 3 Encounters:  09/20/21 169 lb 15.6 oz (77.1 kg)  08/24/21 172 lb 3.2 oz (78.1 kg)  08/01/21 173 lb (78.5 kg)   Physical Exam Vitals reviewed.  Constitutional:      Appearance: Normal appearance.  Cardiovascular:     Rate and Rhythm: Normal rate and regular rhythm.     Pulses: Normal pulses.     Heart sounds: Normal heart sounds.   Pulmonary:     Effort: Pulmonary effort is normal.     Breath sounds: Normal breath sounds.  Chest:  Breasts:    Right: Normal. No swelling, bleeding, inverted nipple, mass, nipple discharge, skin change or tenderness.     Left: Normal. No swelling, bleeding, inverted nipple, mass, nipple discharge, skin change or tenderness.  Abdominal:     Palpations: Abdomen is soft. There is no hepatomegaly, splenomegaly or mass.     Tenderness: There is no abdominal tenderness.  Lymphadenopathy:     Upper Body:     Right upper body: No supraclavicular, axillary or pectoral adenopathy.     Left upper body: No supraclavicular, axillary or pectoral adenopathy.  Neurological:     General: No focal deficit present.     Mental Status: She is alert and oriented to person, place, and time.  Psychiatric:        Mood and Affect: Mood normal.        Behavior: Behavior normal.    Breast Exam Chaperone: Thana Ates     LABORATORY DATA:  I have reviewed the data as listed CMP Latest Ref Rng & Units 09/13/2021 08/24/2021 01/31/2021  Glucose 70 - 99 mg/dL 105(H) 92 101(H)  BUN 8 - 23 mg/dL 16 14 16   Creatinine 0.44 - 1.00 mg/dL 0.56 0.67 0.65  Sodium 135 - 145 mmol/L 139 141 140  Potassium 3.5 - 5.1 mmol/L 3.7 4.8 4.6  Chloride 98 - 111 mmol/L 103 104 104  CO2 22 - 32 mmol/L 28 31 31   Calcium 8.9 - 10.3 mg/dL 9.7 10.1 10.1  Total Protein 6.5 - 8.1 g/dL 7.2 6.7 7.0  Total Bilirubin 0.3 - 1.2 mg/dL 0.8 0.6 0.5  Alkaline Phos 38 - 126 U/L 54 55 53  AST 15 - 41 U/L 35 30 24  ALT 0 - 44 U/L 39 32 26   No results found for: [id number] Lab Results  Component Value Date   WBC 7.1 09/13/2021   HGB 14.2 09/13/2021   HCT 41.8 09/13/2021   MCV 96.3 09/13/2021   PLT 231 09/13/2021   NEUTROABS 4.2 09/13/2021    ASSESSMENT:  1.  Stage I left breast cancer: -Diagnosed in 2009, status post lumpectomy and radiation therapy -Tamoxifen followed by Arimidex completed in December 2014   2.  Osteopenia: - Last  DEXA scan on 07/01/2017 shows T score of -1.6. -She is started on Prolia on 01/04/2014.  Last dose was 08/29/2018 and she is tolerating it well. -DEXA scan on 08/26/2019 shows T score -1.6.   PLAN:  1.  Stage I left breast cancer: - Physical examination today shows contracted left breast with no clear mastectomy scar.  No palpable masses in bilateral breast.  No palpable adenopathy. - Mammogram on 08/28/2021 was BI-RADS Category 1. - Reviewed labs today which showed normal LFTs and CBC. - RTC 1 year with  mammogram and labs.   2.  Osteopenia: - DEXA scan on 08/26/2019 with T score -1.6. - Prolia injections from 2015 through 09/15/2020. - DEXA scan on 08/28/2021 with T score -1.4, osteopenia. - She is currently taking Fosamax 70 mg weekly for the last year.  She was wondering if she could come off of Fosamax. - She has been on bisphosphonate/rank ligand therapy for almost 7 years.  She will discuss with Dr. Yong Channel regarding discontinuation of bisphosphonates.  Breast Cancer therapy associated bone loss: I have recommended calcium, Vitamin D and weight bearing exercises.  Orders placed this encounter:  No orders of the defined types were placed in this encounter.   The patient has a good understanding of the overall plan. She agrees with it. She will call with any problems that may develop before the next visit here.  Derek Jack, MD [place] [phone]   I, Thana Ates, am acting as a scribe for Dr. Derek Jack.  I, Derek Jack MD, have reviewed the above documentation for accuracy and completeness, and I agree with the above.

## 2021-09-20 NOTE — Patient Instructions (Addendum)
[place] at [facility] Discharge Instructions  You were seen and examined today by Dr. Delton Coombes. He reviewed your most recent labs and everything looks good. Please keep follow up appointment as scheduled in one year.   Thank you for choosing [place] at [facility] to provide your oncology and hematology care.  To afford each patient quality time with our provider, please arrive at least 15 minutes before your scheduled appointment time.   If you have a lab appointment with the [facility] please come in thru the Main Entrance and check in at the main information desk.  You need to re-schedule your appointment should you arrive 10 or more minutes late.  We strive to give you quality time with our providers, and arriving late affects you and other patients whose appointments are after yours.  Also, if you no show three or more times for appointments you may be dismissed from the clinic at the providers discretion.     Again, thank you for choosing [facility].  Our hope is that these requests will decrease the amount of time that you wait before being seen by our physicians.       _____________________________________________________________  Should you have questions after your visit to [facility], please contact our office at [phone] and follow the prompts.  Our office hours are 8:00 a.m. and 4:30 p.m. Monday - Friday.  Please note that voicemails left after 4:00 p.m. may not be returned until the following business day.  We are closed weekends and major holidays.  You do have access to a nurse 24-7, just call the main number to the clinic [phone] and do not press any options, hold on the line and a nurse will answer the phone.    For prescription refill requests, have your pharmacy contact our office and allow 72 hours.    Due to Covid, you will need to wear a mask upon entering the hospital. If you do  not have a mask, a mask will be given to you at the Main Entrance upon arrival. For doctor visits, patients may have 1 support person age 36 or older with them. For treatment visits, patients can not have anyone with them due to social distancing guidelines and our immunocompromised population.

## 2021-09-22 ENCOUNTER — Ambulatory Visit (INDEPENDENT_AMBULATORY_CARE_PROVIDER_SITE_OTHER): Payer: Medicare PPO

## 2021-09-22 ENCOUNTER — Other Ambulatory Visit: Payer: Self-pay

## 2021-09-22 DIAGNOSIS — Z0000 Encounter for general adult medical examination without abnormal findings: Secondary | ICD-10-CM | POA: Diagnosis not present

## 2021-09-22 NOTE — Patient Instructions (Signed)
Angela [name] , Thank you for taking time to come for your Medicare Wellness Visit. I appreciate your ongoing commitment to your health goals. Please review the following plan we discussed and let me know if I can assist you in the future.   Screening recommendations/referrals: Colonoscopy: Done 06/05/18 repeat every 3 years  Mammogram: Done 08/28/21 repeat every year  Bone Density: Done 08/28/21 repeat every 2 years  Recommended yearly ophthalmology/optometry visit for glaucoma screening and checkup Recommended yearly dental visit for hygiene and checkup  Vaccinations: Influenza vaccine: Done 06/06/21 repeat every year  Pneumococcal vaccine: Up to date Tdap vaccine: Done 04/05/16 repeat every 10 years  Shingles vaccine: Completed 07/01/17 & 09/20/17    Covid-19:Completed 2/2, 3/4, 07/05/20 & 3/31, 05/30/21  Advanced directives: Copies in chart   Conditions/risks identified: lose 5 lbs   Next appointment: Follow up in one year for your annual wellness visit    Preventive Care 5 Years and Older, Female Preventive care refers to lifestyle choices and visits with your health care provider that can promote health and wellness. What does preventive care include? A yearly physical exam. This is also called an annual well check. Dental exams once or twice a year. Routine eye exams. Ask your health care provider how often you should have your eyes checked. Personal lifestyle choices, including: Daily care of your teeth and gums. Regular physical activity. Eating a healthy diet. Avoiding tobacco and drug use. Limiting alcohol use. Practicing safe sex. Taking low-dose aspirin every day. Taking vitamin and mineral supplements as recommended by your health care provider. What happens during an annual well check? The services and screenings done by your health care provider during your annual well check will depend on your age, overall health, lifestyle risk factors, and family history of  disease. Counseling  Your health care provider may ask you questions about your: Alcohol use. Tobacco use. Drug use. Emotional well-being. Home and relationship well-being. Sexual activity. Eating habits. History of falls. Memory and ability to understand (cognition). Work and work Statistician. Reproductive health. Screening  You may have the following tests or measurements: Height, weight, and BMI. Blood pressure. Lipid and cholesterol levels. These may be checked every 5 years, or more frequently if you are over 33 years old. Skin check. Lung cancer screening. You may have this screening every year starting at age 35 if you have a 30-pack-year history of smoking and currently smoke or have quit within the past 15 years. Fecal occult blood test (FOBT) of the stool. You may have this test every year starting at age 21. Flexible sigmoidoscopy or colonoscopy. You may have a sigmoidoscopy every 5 years or a colonoscopy every 10 years starting at age 100. Hepatitis C blood test. Hepatitis B blood test. Sexually transmitted disease (STD) testing. Diabetes screening. This is done by checking your blood sugar (glucose) after you have not eaten for a while (fasting). You may have this done every 1-3 years. Bone density scan. This is done to screen for osteoporosis. You may have this done starting at age 53. Mammogram. This may be done every 1-2 years. Talk to your health care provider about how often you should have regular mammograms. Talk with your health care provider about your test results, treatment options, and if necessary, the need for more tests. Vaccines  Your health care provider may recommend certain vaccines, such as: Influenza vaccine. This is recommended every year. Tetanus, diphtheria, and acellular pertussis (Tdap, Td) vaccine. You may need a Td booster every  10 years. Zoster vaccine. You may need this after age 72. Pneumococcal 13-valent conjugate (PCV13) vaccine. One  dose is recommended after age 30. Pneumococcal polysaccharide (PPSV23) vaccine. One dose is recommended after age 2. Talk to your health care provider about which screenings and vaccines you need and how often you need them. This information is not intended to replace advice given to you by your health care provider. Make sure you discuss any questions you have with your health care provider. Document Released: 09/23/2015 Document Revised: 05/16/2016 Document Reviewed: 06/28/2015 Elsevier Interactive Patient Education  2017 [place] Prevention in the Home Falls can cause injuries. They can happen to people of all ages. There are many things you can do to make your home safe and to help prevent falls. What can I do on the outside of my home? Regularly fix the edges of walkways and driveways and fix any cracks. Remove anything that might make you trip as you walk through a door, such as a raised step or threshold. Trim any bushes or trees on the path to your home. Use bright outdoor lighting. Clear any walking paths of anything that might make someone trip, such as rocks or tools. Regularly check to see if handrails are loose or broken. Make sure that both sides of any steps have handrails. Any raised decks and porches should have guardrails on the edges. Have any leaves, snow, or ice cleared regularly. Use sand or salt on walking paths during winter. Clean up any spills in your garage right away. This includes oil or grease spills. What can I do in the bathroom? Use night lights. Install grab bars by the toilet and in the tub and shower. Do not use towel bars as grab bars. Use non-skid mats or decals in the tub or shower. If you need to sit down in the shower, use a plastic, non-slip stool. Keep the floor dry. Clean up any water that spills on the floor as soon as it happens. Remove soap buildup in the tub or shower regularly. Attach bath mats securely with double-sided  non-slip rug tape. Do not have throw rugs and other things on the floor that can make you trip. What can I do in the bedroom? Use night lights. Make sure that you have a light by your bed that is easy to reach. Do not use any sheets or blankets that are too big for your bed. They should not hang down onto the floor. Have a firm chair that has side arms. You can use this for support while you get dressed. Do not have throw rugs and other things on the floor that can make you trip. What can I do in the kitchen? Clean up any spills right away. Avoid walking on wet floors. Keep items that you use a lot in easy-to-reach places. If you need to reach something above you, use a strong step stool that has a grab bar. Keep electrical cords out of the way. Do not use floor polish or wax that makes floors slippery. If you must use wax, use non-skid floor wax. Do not have throw rugs and other things on the floor that can make you trip. What can I do with my stairs? Do not leave any items on the stairs. Make sure that there are handrails on both sides of the stairs and use them. Fix handrails that are broken or loose. Make sure that handrails are as long as the stairways. Check any carpeting to make  sure that it is firmly attached to the stairs. Fix any carpet that is loose or worn. Avoid having throw rugs at the top or bottom of the stairs. If you do have throw rugs, attach them to the floor with carpet tape. Make sure that you have a light switch at the top of the stairs and the bottom of the stairs. If you do not have them, ask someone to add them for you. What else can I do to help prevent falls? Wear shoes that: Do not have high heels. Have rubber bottoms. Are comfortable and fit you well. Are closed at the toe. Do not wear sandals. If you use a stepladder: Make sure that it is fully opened. Do not climb a closed stepladder. Make sure that both sides of the stepladder are locked into place. Ask  someone to hold it for you, if possible. Clearly mark and make sure that you can see: Any grab bars or handrails. First and last steps. Where the edge of each step is. Use tools that help you move around (mobility aids) if they are needed. These include: Canes. Walkers. Scooters. Crutches. Turn on the lights when you go into a dark area. Replace any light bulbs as soon as they burn out. Set up your furniture so you have a clear path. Avoid moving your furniture around. If any of your floors are uneven, fix them. If there are any pets around you, be aware of where they are. Review your medicines with your doctor. Some medicines can make you feel dizzy. This can increase your chance of falling. Ask your doctor what other things that you can do to help prevent falls. This information is not intended to replace advice given to you by your health care provider. Make sure you discuss any questions you have with your health care provider. Document Released: 06/23/2009 Document Revised: 02/02/2016 Document Reviewed: 10/01/2014 Elsevier Interactive Patient Education  2017 [employer].

## 2021-09-22 NOTE — Progress Notes (Addendum)
Virtual Visit via Telephone Note  I connected with  [name] on 09/22/21 at 10:15 AM EST by telephone and verified that I am speaking with the correct person using two identifiers.  Medicare Annual Wellness visit completed telephonically due to Covid-19 pandemic.   Persons participating in this call: This Health Coach and this patient.   Location: Patient: Home  Provider: Office    I discussed the limitations, risks, security and privacy concerns of performing an evaluation and management service by telephone and the availability of in person appointments. The patient expressed understanding and agreed to proceed.  Unable to perform video visit due to video visit attempted and failed and/or patient does not have video capability.   Some vital signs may be absent or patient reported.   Willette Brace, LPN  I connected with  [name] on 09/22/21 at 10:15 AM EST by telephone and verified that I am speaking with the correct person using two identifiers.  Medicare Annual Wellness visit completed telephonically due to Covid-19 pandemic.   Persons participating in this call: This Health Coach and this patient.   Location: Patient: Home Provider: Office    I discussed the limitations, risks, security and privacy concerns of performing an evaluation and management service by telephone and the availability of in person appointments. The patient expressed understanding and agreed to proceed.  Unable to perform video visit due to video visit attempted and failed and/or patient does not have video capability.   Some vital signs may be absent or patient reported.   Willette Brace, LPN   Subjective:   DAWNNA GRITZ is a 76 y.o. female who presents for Medicare Annual (Subsequent) preventive examination.  Review of Systems     Cardiac Risk Factors include: advanced age (>66men, >4 women);hypertension;dyslipidemia     Objective:    There were no vitals filed for this  visit. There is no height or weight on file to calculate BMI.  Advanced Directives 09/22/2021 09/22/2021 09/20/2021 09/16/2020 09/15/2020 03/03/2020 09/02/2019  Does Patient Have a Medical Advance Directive? Yes Yes Yes Yes Yes No Yes  Type of Advance Directive Healthcare Power of [place];Living will [place];Living will - [place];Living will  Does patient want to make changes to medical advance directive? - - No - Patient declined - No - Patient declined - No - Patient declined  Copy of Healthcare Power of Attorney in Chart? - - - No - copy requested No - copy requested - No - copy requested  Would patient like information on creating a medical advance directive? - - No - Patient declined - - No - Patient declined -    Current Medications (verified) Outpatient Encounter Medications as of 09/22/2021  Medication Sig   acetaminophen (TYLENOL) 500 MG tablet acetaminophen 500 mg tablet  Take 2 tablets every 6 hours by oral route as needed.   alendronate (FOSAMAX) 70 MG tablet Take 1 tablet (70 mg total) by mouth every 7 (seven) days. Take with a full glass of water on an empty stomach.   aspirin 81 MG tablet Take 81 mg by mouth every evening.   Calcium Carbonate-Vitamin D 600-400 MG-UNIT tablet Take 2 tablets by mouth every evening.    Cholecalciferol (VITAMIN D3) 50 MCG (2000 UT) capsule Take 2,000 Units by mouth every evening.    Coenzyme Q10 (COQ-10) 100 MG CAPS Take 100 mg by mouth daily.  fish oil-omega-3 fatty acids 1000 MG capsule Take 1 g by mouth every evening.    hydrochlorothiazide (HYDRODIURIL) 25 MG tablet Take 1 tablet (25 mg total) by mouth daily.   isosorbide mononitrate (IMDUR) 30 MG 24 hr tablet TAKE 1 TABLET(30 MG) BY MOUTH TWICE DAILY   metFORMIN (GLUCOPHAGE) 500 MG tablet Take 1 tablet (500 mg total) by mouth 2 (two) times daily with a meal.   metoprolol succinate  (TOPROL-XL) 50 MG 24 hr tablet Take with or immediately following a meal.   Multiple Vitamin (MULTIVITAMIN) tablet Take 1 tablet by mouth every evening.   nitroGLYCERIN (NITROSTAT) 0.4 MG SL tablet ONE TABLET UNDER TONGUE AS NEEDED FOR CHEST PAIN   potassium chloride SA (KLOR-CON) 20 MEQ tablet TAKE 1 TABLET BY MOUTH EVERY DAY   rosuvastatin (CRESTOR) 40 MG tablet TAKE 1 TABLET(40 MG) BY MOUTH DAILY   vitamin C (ASCORBIC ACID) 500 MG tablet Take 500 mg by mouth every evening.    No facility-administered encounter medications on file as of 09/22/2021.    Allergies (verified) Lisinopril and Statins   History: Past Medical History:  Diagnosis Date   Arthritis    BCC (basal cell carcinoma of skin) 08/31/1994   RIGHT CLAVICLE INFERIOR   BCC (basal cell carcinoma of skin) 02/21/1994   RIGHT SCAPULA TX CX3 5FU   BCC (basal cell carcinoma of skin) 02/21/1994   RIGHT CLAVICAL TX CX3 5FU   BCC (basal cell carcinoma of skin) 05/25/1994   RIGHT SCAPULA TX =MOHS   BCC (basal cell carcinoma of skin) 02/02/1999   RIGHT FRONT SHOULDER TX CURET X3 ,EXC   BCC (basal cell carcinoma of skin) 12/25/1999   RIGHT UPPER BACK CX3 5FU   BCC (basal cell carcinoma of skin) 03/29/2005   RIGHT LATER LOWER LEG TX MOHS   BCC (basal cell carcinoma of skin) 11/10/2005   LEFT ANT. NECK TX=MOHS   BCC (basal cell carcinoma of skin) 08/26/2018   RIGHT FRONT SHOULDER TX WITH BX   BCC (basal cell carcinoma) 08/31/1994   RIGHT SUPER CLAVICLE MEDIAL TX CX3 5FU   Breast cancer ([place]) 2009   Left Breast Cancer   CAD (coronary artery disease)    Cataract    Essential hypertension    Hyperlipidemia    Macular retinal cyst of right eye    OSA (obstructive sleep apnea)    CPAP   Osteopenia    Personal history of radiation therapy    Past Surgical History:  Procedure Laterality Date   BREAST EXCISIONAL BIOPSY Right    BREAST LUMPECTOMY Left 06/2008   CATARACT EXTRACTION  2012   COLONOSCOPY     Eyelid surgery  Bilateral    INTRAVASCULAR PRESSURE WIRE/FFR STUDY N/A 06/01/2019   Procedure: INTRAVASCULAR PRESSURE WIRE/FFR STUDY;  Surgeon: Belva Crome, MD;  Location: [facility];  Service: Cardiovascular;  Laterality: N/A;   LEFT HEART CATH AND CORONARY ANGIOGRAPHY N/A 06/01/2019   Procedure: LEFT HEART CATH AND CORONARY ANGIOGRAPHY;  Surgeon: Belva Crome, MD;  Location: [facility];  Service: Cardiovascular;  Laterality: N/A;   MASS EXCISION  03/2008   Appendix   SKIN CANCER EXCISION     TONSILLECTOMY     Family History  Problem Relation Age of Onset   Heart attack Mother    Hypertension Mother    Heart disease Father    Heart attack Father    Hypertension Father    Colon cancer Neg Hx    Stroke Neg  Hx    Esophageal cancer Neg Hx    Rectal cancer Neg Hx    Stomach cancer Neg Hx    Social History   Socioeconomic History   Marital status: Married    Spouse name: Not on file   Number of children: 3   Years of education: Not on file   Highest education level: Not on file  Occupational History   Occupation: retired    Fish farm manager: RETIRED    Comment: works 1 day a week  Tobacco Use   Smoking status: Never   Smokeless tobacco: Never  Vaping Use   Vaping Use: Never used  Substance and Sexual Activity   Alcohol use: Yes    Alcohol/week: 3.0 standard drinks    Types: 3 Glasses of wine per week    Comment: one glass a week   Drug use: No   Sexual activity: Not on file  Other Topics Concern   Not on file  Social History Narrative   Married. 2 daughters. 4 grandkids (teenagers in [place] and then two 3 and under in 2021)      Retired from teaching special ed mostly [place]: travel, time with friends, eating out, lots of grandkids time in [place] Resource Strain: Low Risk    Difficulty of Paying Living Expenses: Not hard at all  Food Insecurity: No Food Insecurity   Worried About Charity fundraiser in the Last  Year: Never true   Arboriculturist in the Last Year: Never true  Transportation Needs: No Transportation Needs   Lack of Transportation (Medical): No   Lack of Transportation (Non-Medical): No  Physical Activity: Insufficiently Active   Days of Exercise per Week: 4 days   Minutes of Exercise per Session: 30 min  Stress: No Stress Concern Present   Feeling of Stress : Not at all  Social Connections: Socially Integrated   Frequency of Communication with Friends and Family: More than three times a week   Frequency of Social Gatherings with Friends and Family: More than three times a week   Attends Religious Services: More than 4 times per year   Active Member of [employer] or Organizations: Yes   Attends Archivist Meetings: 1 to 4 times per year   Marital Status: Married    Tobacco Counseling Counseling given: Not Answered   Clinical Intake:  Pre-visit preparation completed: Yes  Pain : No/denies pain     BMI - recorded: 27.98 Nutritional Status: BMI 25 -29 Overweight Nutritional Risks: None Diabetes: No  How often do you need to have someone help you when you read instructions, pamphlets, or other written materials from your doctor or pharmacy?: 1 - Never  Diabetic?no  Interpreter Needed?: No  Information entered by :: Charlott Rakes, LPN   Activities of Daily Living In your present state of health, do you have any difficulty performing the following activities: 09/22/2021  Hearing? Y  Comment hearing loss  Vision? N  Difficulty concentrating or making decisions? N  Walking or climbing stairs? Y  Comment knees stiff at times  Dressing or bathing? N  Doing errands, shopping? N  Preparing Food and eating ? N  Using the Toilet? N  In the past six months, have you accidently leaked urine? Y  Comment wears a light weight pad  Do you have problems with loss of bowel control? N  Managing your Medications? N  Managing your Finances? N  Housekeeping or managing  your Housekeeping? N  Some recent data might be hidden    Patient Care Team: Marin Olp, MD as PCP - General (Family Medicine) Satira Sark, MD as PCP - Cardiology (Cardiology) Jovita Kussmaul, MD as Consulting Physician (General Surgery) Thea Silversmith, MD as Consulting Physician (Radiation Oncology) Inda Castle, MD (Inactive) as Consulting Physician (Gastroenterology) Danice Goltz, MD as Consulting Physician (Ophthalmology) Zadie Rhine Clent Demark, MD as Consulting Physician (Ophthalmology) Chesley Mires, MD as Consulting Physician (Pulmonary Disease) Derek Jack, MD as Consulting Physician (Hematology) Gardiner Barefoot, DPM as Consulting Physician (Podiatry) [name], MD as Consulting Physician (Dermatology)  Indicate any recent Medical Services you may have received from other than [facility] providers in the past year (date may be approximate).     Assessment:   This is a routine wellness examination for M[place].  Hearing/Vision screen Hearing Screening - Comments:: Pt stated hearing loss  Vision Screening - Comments:: Pt follows up with Dr Zadie Rhine and Dr Satira Sark for annual eye exaMS   Dietary issues and exercise activities discussed: Current Exercise Habits: Home exercise routine, Type of exercise: walking, Time (Minutes): 30, Frequency (Times/Week): 5, Weekly Exercise (Minutes/Week): 150   Goals Addressed             This Visit's Progress    Patient Stated       Lose weight at least 5lbs        Depression Screen PHQ 2/9 Scores 09/22/2021 01/31/2021 09/16/2020 01/26/2020 07/24/2019 03/28/2018 03/22/2017  PHQ - 2 Score 0 0 0 0 0 0 0  PHQ- 9 Score - 0 - - - - -    Fall Risk Fall Risk  09/22/2021 01/31/2021 09/16/2020 01/26/2020 07/24/2019  Falls in the past year? 0 0 0 0 0  Number falls in past yr: 0 0 0 - 0  Injury with Fall? 0 0 0 - 0  Risk for fall due to : Impaired vision - Impaired vision No Fall Risks -  Follow up Falls prevention discussed -  Falls prevention discussed - -    FALL RISK PREVENTION PERTAINING TO THE HOME:  Any stairs in or around the home? Yes  If so, are there any without handrails? No  Home free of loose throw rugs in walkways, pet beds, electrical cords, etc? Yes  Adequate lighting in your home to reduce risk of falls? Yes   ASSISTIVE DEVICES UTILIZED TO PREVENT FALLS:  Life alert? No  Use of a cane, walker or w/c? No  Grab bars in the bathroom? Yes  Shower chair or bench in shower? No  Elevated toilet seat or a handicapped toilet? No   TIMED UP AND GO:  Was the test performed? No .   Cognitive Function:     6CIT Screen 09/22/2021 09/16/2020 07/24/2019  What Year? 0 points 0 points 0 points  What month? 0 points 0 points 0 points  What time? 0 points - 0 points  Count back from 20 0 points 0 points 0 points  Months in reverse 0 points 0 points 0 points  Repeat phrase 0 points 0 points 0 points  Total Score 0 - 0    Immunizations Immunization History  Administered Date(s) Administered   Fluad Quad(high Dose 65+) 06/12/2019, 05/05/2020   Hep A / Hep B 03/16/2013, 10/06/2013   Hepatitis B, adult 04/17/2013   Influenza Split 06/09/2012   Influenza Whole 05/22/2011   Influenza,inj,Quad PF,6+ Mos 05/27/2013   Influenza-Unspecified  06/10/2014, 05/24/2015, 04/24/2016, 04/26/2017, 05/11/2018, 06/06/2021   Moderna SARS-COV2 Booster Vaccination 12/08/2020, 05/30/2021   Moderna Sars-Covid-2 Vaccination 10/13/2019, 11/12/2019, 07/05/2020   Pneumococcal Conjugate-13 11/12/2014   Pneumococcal Polysaccharide-23 08/24/2008, 07/17/2013   Td 04/19/2010   Tdap 04/05/2016   Zoster Recombinat (Shingrix) 07/01/2017, 09/20/2017   Zoster, Live 11/17/2008    TDAP status: Up to date  Flu Vaccine status: Up to date  Pneumococcal vaccine status: Up to date  Covid-19 vaccine status: Completed vaccines  Qualifies for Shingles Vaccine? Yes   Zostavax completed Yes   Shingrix Completed?: Yes  Screening  Tests Health Maintenance  Topic Date Due   COLONOSCOPY (Pts 45-3yrs Insurance coverage will need to be confirmed)  06/05/2021   COVID-19 Vaccine (4 - Booster for Moderna series) 11/08/2021 (Originally 07/25/2021)   MAMMOGRAM  08/28/2022   TETANUS/TDAP  04/05/2026   Pneumonia Vaccine 78+ Years old  Completed   INFLUENZA VACCINE  Completed   DEXA SCAN  Completed   Hepatitis C Screening  Completed   Zoster Vaccines- Shingrix  Completed   HPV VACCINES  Aged Out    Health Maintenance  Health Maintenance Due  Topic Date Due   COLONOSCOPY (Pts 45-102yrs Insurance coverage will need to be confirmed)  06/05/2021    Colorectal cancer screening: Type of screening: Colonoscopy. Completed 06/05/18. Repeat every 3 years pt stated she will make appt   Mammogram status: Completed 08/28/21. Repeat every year  Bone Density status: Completed 08/28/21. Results reflect: Bone density results: OSTEOPOROSIS. Repeat every 2 years.   Additional Screening:  Hepatitis C Screening: Completed 02/21/16  Vision Screening: Recommended annual ophthalmology exams for early detection of glaucoma and other disorders of the eye. Is the patient up to date with their annual eye exam?  Yes  Who is the provider or what is the name of the office in which the patient attends annual eye exams? Dr Satira Sark or Dr Zadie Rhine  If pt is not established with a provider, would they like to be referred to a provider to establish care? No .   Dental Screening: Recommended annual dental exams for proper oral hygiene  Community Resource Referral / Chronic Care Management: CRR required this visit?  No   CCM required this visit?  No      Plan:     I have personally reviewed and noted the following in the patients chart:   Medical and social history Use of alcohol, tobacco or illicit drugs  Current medications and supplements including opioid prescriptions.  Functional ability and status Nutritional status Physical  activity Advanced directives List of other physicians Hospitalizations, surgeries, and ER visits in previous 12 months Vitals Screenings to include cognitive, depression, and falls Referrals and appointments  In addition, I have reviewed and discussed with patient certain preventive protocols, quality metrics, and best practice recommendations. A written personalized care plan for preventive services as well as general preventive health recommendations were provided to patient.     Willette Brace, LPN   7/37/1062   Nurse Notes: none

## 2021-10-01 DIAGNOSIS — G4733 Obstructive sleep apnea (adult) (pediatric): Secondary | ICD-10-CM | POA: Diagnosis not present

## 2021-10-02 ENCOUNTER — Ambulatory Visit: Payer: Medicare PPO | Admitting: Podiatry

## 2021-10-02 ENCOUNTER — Encounter: Payer: Self-pay | Admitting: Podiatry

## 2021-10-02 ENCOUNTER — Other Ambulatory Visit: Payer: Self-pay

## 2021-10-02 DIAGNOSIS — G629 Polyneuropathy, unspecified: Secondary | ICD-10-CM

## 2021-10-02 DIAGNOSIS — B351 Tinea unguium: Secondary | ICD-10-CM | POA: Diagnosis not present

## 2021-10-02 DIAGNOSIS — M79609 Pain in unspecified limb: Secondary | ICD-10-CM | POA: Diagnosis not present

## 2021-10-02 DIAGNOSIS — R7303 Prediabetes: Secondary | ICD-10-CM

## 2021-10-02 NOTE — Progress Notes (Signed)
°  Subjective:  Patient ID: Angela [name], female    DOB: Mar 27, 1946,   MRN: [id number]  Chief Complaint  Patient presents with   debride    Pt is here for RFC    76 y.o. female presents for painful elongated and thickened nails as well as trouble with some ingrown nails. . Patient relates difficulty with trimming her nails and requesting trim today. She is pre-diabetic. Last A1c was  6.1 in December. Denies any other pedal complaints. Denies n/v/f/c.   PCP Garret Reddish MD.   Past Medical History:  Diagnosis Date   Arthritis    BCC (basal cell carcinoma of skin) 08/31/1994   RIGHT CLAVICLE INFERIOR   BCC (basal cell carcinoma of skin) 02/21/1994   RIGHT SCAPULA TX CX3 5FU   BCC (basal cell carcinoma of skin) 02/21/1994   RIGHT CLAVICAL TX CX3 5FU   BCC (basal cell carcinoma of skin) 05/25/1994   RIGHT SCAPULA TX =MOHS   BCC (basal cell carcinoma of skin) 02/02/1999   RIGHT FRONT SHOULDER TX CURET X3 ,EXC   BCC (basal cell carcinoma of skin) 12/25/1999   RIGHT UPPER BACK CX3 5FU   BCC (basal cell carcinoma of skin) 03/29/2005   RIGHT LATER LOWER LEG TX MOHS   BCC (basal cell carcinoma of skin) 11/10/2005   LEFT ANT. NECK TX=MOHS   BCC (basal cell carcinoma of skin) 08/26/2018   RIGHT FRONT SHOULDER TX WITH BX   BCC (basal cell carcinoma) 08/31/1994   RIGHT SUPER CLAVICLE MEDIAL TX CX3 5FU   Breast cancer ([place]) 2009   Left Breast Cancer   CAD (coronary artery disease)    Cataract    Essential hypertension    Hyperlipidemia    Macular retinal cyst of right eye    OSA (obstructive sleep apnea)    CPAP   Osteopenia    Personal history of radiation therapy     Objective:  Physical Exam: Vascular: DP/PT pulses 2/4 bilateral. CFT <3 seconds. Normal hair growth on digits. No edema.  Skin. No lacerations or abrasions bilateral feet. Nails 1-5 are thickened discolored and elongated with subungual debris. Right hallux with some incurvation noted.  Musculoskeletal: MMT 5/5  bilateral lower extremities in DF, PF, Inversion and Eversion. Deceased ROM in DF of ankle joint.  Neurological: Sensation intact to light touch.   Assessment:   1. Pain due to onychomycosis of nail   2. Onychomycosis   3. Neuropathy   4. Pre-diabetes      Plan:  Patient was evaluated and treated and all questions answered. -Discussed and educated patient on diabetic foot care, especially with  regards to the vascular, neurological and musculoskeletal systems. Patient is pre-diabetic. -Stressed the importance of good glycemic control and the detriment of not  controlling glucose levels in relation to the foot. -Discussed supportive shoes at all times and checking feet regularly.  -Mechanically debrided all nails 1-5 bilateral using sterile nail nipper and filed with dremel without incident. -Answered all patient questions -Patient to return as needed  -Patient advised to call the office if any problems or questions arise in the meantime.   Lorenda Peck, DPM

## 2021-10-03 ENCOUNTER — Other Ambulatory Visit: Payer: Self-pay | Admitting: Cardiology

## 2021-10-25 ENCOUNTER — Ambulatory Visit: Payer: Medicare PPO | Admitting: Dermatology

## 2021-10-26 ENCOUNTER — Encounter (INDEPENDENT_AMBULATORY_CARE_PROVIDER_SITE_OTHER): Payer: Medicare PPO | Admitting: Ophthalmology

## 2021-10-31 ENCOUNTER — Encounter (INDEPENDENT_AMBULATORY_CARE_PROVIDER_SITE_OTHER): Payer: Self-pay

## 2021-10-31 LAB — HM DIABETES EYE EXAM

## 2021-11-01 ENCOUNTER — Ambulatory Visit: Payer: Medicare PPO | Admitting: Dermatology

## 2021-11-01 DIAGNOSIS — G4733 Obstructive sleep apnea (adult) (pediatric): Secondary | ICD-10-CM | POA: Diagnosis not present

## 2021-11-07 ENCOUNTER — Encounter (INDEPENDENT_AMBULATORY_CARE_PROVIDER_SITE_OTHER): Payer: Medicare PPO | Admitting: Ophthalmology

## 2021-11-09 ENCOUNTER — Ambulatory Visit (INDEPENDENT_AMBULATORY_CARE_PROVIDER_SITE_OTHER): Payer: Medicare PPO | Admitting: Ophthalmology

## 2021-11-09 ENCOUNTER — Encounter (INDEPENDENT_AMBULATORY_CARE_PROVIDER_SITE_OTHER): Payer: Self-pay | Admitting: Ophthalmology

## 2021-11-09 ENCOUNTER — Other Ambulatory Visit: Payer: Self-pay

## 2021-11-09 DIAGNOSIS — G4733 Obstructive sleep apnea (adult) (pediatric): Secondary | ICD-10-CM | POA: Diagnosis not present

## 2021-11-09 DIAGNOSIS — H35073 Retinal telangiectasis, bilateral: Secondary | ICD-10-CM

## 2021-11-09 NOTE — Progress Notes (Signed)
11/09/2021     CHIEF COMPLAINT Patient presents for  Chief Complaint  Patient presents with   Retina Follow Up      HISTORY OF PRESENT ILLNESS: Angela [name] is a 76 y.o. female who presents to the clinic today for:   HPI     Retina Follow Up           Diagnosis: Macular telangiectasis   Laterality: both eyes   Onset: 6 months ago   Course: stable         Comments   6 mos fu ou oct. Pt states she saw Dr. Burgess Estelle are [facility] on 10/31/2021 and is having cataract surgery in the left eye in March. Patient states vision is stable and unchanged since last visit. Denies any new floaters or FOL.       Last edited by Nelva Nay on 11/09/2021 10:00 AM.      Referring physician: Shelva Majestic, MD [address] [place],  [place] [address]  HISTORICAL INFORMATION:   Selected notes from the MEDICAL RECORD NUMBER    Lab Results  Component Value Date   HGBA1C 6.1 08/24/2021     CURRENT MEDICATIONS: No current outpatient medications on file. (Ophthalmic Drugs)   No current facility-administered medications for this visit. (Ophthalmic Drugs)   Current Outpatient Medications (Other)  Medication Sig   acetaminophen (TYLENOL) 500 MG tablet acetaminophen 500 mg tablet  Take 2 tablets every 6 hours by oral route as needed.   alendronate (FOSAMAX) 70 MG tablet Take 1 tablet (70 mg total) by mouth every 7 (seven) days. Take with a full glass of water on an empty stomach.   aspirin 81 MG tablet Take 81 mg by mouth every evening.   Calcium Carbonate-Vitamin D 600-400 MG-UNIT tablet Take 2 tablets by mouth every evening.    Cholecalciferol (VITAMIN D3) 50 MCG (2000 UT) capsule Take 2,000 Units by mouth every evening.    Coenzyme Q10 (COQ-10) 100 MG CAPS Take 100 mg by mouth daily.    fish oil-omega-3 fatty acids 1000 MG capsule Take 1 g by mouth every evening.    hydrochlorothiazide (HYDRODIURIL) 25 MG tablet Take 1 tablet (25 mg total) by mouth  daily.   isosorbide mononitrate (IMDUR) 30 MG 24 hr tablet TAKE 1 TABLET(30 MG) BY MOUTH TWICE DAILY   metFORMIN (GLUCOPHAGE) 500 MG tablet Take 1 tablet (500 mg total) by mouth 2 (two) times daily with a meal.   metoprolol succinate (TOPROL-XL) 50 MG 24 hr tablet Take with or immediately following a meal.   Multiple Vitamin (MULTIVITAMIN) tablet Take 1 tablet by mouth every evening.   nitroGLYCERIN (NITROSTAT) 0.4 MG SL tablet ONE TABLET UNDER TONGUE AS NEEDED FOR CHEST PAIN   potassium chloride SA (KLOR-CON) 20 MEQ tablet TAKE 1 TABLET BY MOUTH EVERY DAY   rosuvastatin (CRESTOR) 40 MG tablet TAKE 1 TABLET(40 MG) BY MOUTH DAILY   vitamin C (ASCORBIC ACID) 500 MG tablet Take 500 mg by mouth every evening.    No current facility-administered medications for this visit. (Other)      REVIEW OF SYSTEMS: ROS   Negative for: Constitutional, Gastrointestinal, Neurological, Skin, Genitourinary, Musculoskeletal, HENT, Endocrine, Cardiovascular, Eyes, Respiratory, Psychiatric, Allergic/Imm, Heme/Lymph Last edited by Edmon Crape, MD on 11/09/2021 10:13 AM.       ALLERGIES Allergies  Allergen Reactions   Lisinopril Cough   Statins Other (See Comments)    Myalgia with lipitor- unproven She is able to tolerate simvastatin  PAST MEDICAL HISTORY Past Medical History:  Diagnosis Date   Arthritis    BCC (basal cell carcinoma of skin) 08/31/1994   RIGHT CLAVICLE INFERIOR   BCC (basal cell carcinoma of skin) 02/21/1994   RIGHT SCAPULA TX CX3 5FU   BCC (basal cell carcinoma of skin) 02/21/1994   RIGHT CLAVICAL TX CX3 5FU   BCC (basal cell carcinoma of skin) 05/25/1994   RIGHT SCAPULA TX =MOHS   BCC (basal cell carcinoma of skin) 02/02/1999   RIGHT FRONT SHOULDER TX CURET X3 ,EXC   BCC (basal cell carcinoma of skin) 12/25/1999   RIGHT UPPER BACK CX3 5FU   BCC (basal cell carcinoma of skin) 03/29/2005   RIGHT LATER LOWER LEG TX MOHS   BCC (basal cell carcinoma of skin) 11/10/2005    LEFT ANT. NECK TX=MOHS   BCC (basal cell carcinoma of skin) 08/26/2018   RIGHT FRONT SHOULDER TX WITH BX   BCC (basal cell carcinoma) 08/31/1994   RIGHT SUPER CLAVICLE MEDIAL TX CX3 5FU   Breast cancer (HCC) 2009   Left Breast Cancer   CAD (coronary artery disease)    Cataract    Essential hypertension    Hyperlipidemia    Macular retinal cyst of right eye    OSA (obstructive sleep apnea)    CPAP   Osteopenia    Personal history of radiation therapy    Past Surgical History:  Procedure Laterality Date   BREAST EXCISIONAL BIOPSY Right    BREAST LUMPECTOMY Left 06/2008   CATARACT EXTRACTION  2012   COLONOSCOPY     Eyelid surgery Bilateral    INTRAVASCULAR PRESSURE WIRE/FFR STUDY N/A 06/01/2019   Procedure: INTRAVASCULAR PRESSURE WIRE/FFR STUDY;  Surgeon: Lyn Records, MD;  Location: [facility];  Service: Cardiovascular;  Laterality: N/A;   LEFT HEART CATH AND CORONARY ANGIOGRAPHY N/A 06/01/2019   Procedure: LEFT HEART CATH AND CORONARY ANGIOGRAPHY;  Surgeon: Lyn Records, MD;  Location: [facility];  Service: Cardiovascular;  Laterality: N/A;   MASS EXCISION  03/2008   Appendix   SKIN CANCER EXCISION     TONSILLECTOMY      FAMILY HISTORY Family History  Problem Relation Age of Onset   Heart attack Mother    Hypertension Mother    Heart disease Father    Heart attack Father    Hypertension Father    Colon cancer Neg Hx    Stroke Neg Hx    Esophageal cancer Neg Hx    Rectal cancer Neg Hx    Stomach cancer Neg Hx     SOCIAL HISTORY Social History   Tobacco Use   Smoking status: Never   Smokeless tobacco: Never  Vaping Use   Vaping Use: Never used  Substance Use Topics   Alcohol use: Yes    Alcohol/week: 3.0 standard drinks    Types: 3 Glasses of wine per week    Comment: one glass a week   Drug use: No         OPHTHALMIC EXAM:  Base Eye Exam     Visual Acuity (ETDRS)       Right Left   Dist cc 20/40 -2 20/40   Dist ph cc NI NI     Correction: Glasses         Tonometry (Tonopen, 10:03 AM)       Right Left   Pressure 17 18         Pupils       Pupils  Dark Light APD   Right PERRL 4 3 None   Left PERRL 4 3 None         Extraocular Movement       Right Left    Full Full         Neuro/Psych     Oriented x3: Yes   Mood/Affect: Normal         Dilation     Both eyes: 1.0% Mydriacyl, 2.5% Phenylephrine @ 10:03 AM           Slit Lamp and Fundus Exam     External Exam       Right Left   External Normal Normal         Slit Lamp Exam       Right Left   Lids/Lashes Normal Normal   Conjunctiva/Sclera White and quiet White and quiet   Cornea Clear Clear   Anterior Chamber Deep and quiet Deep and quiet   Iris Round and reactive Round and reactive   Lens Centered posterior chamber intraocular lens 2+ Nuclear sclerosis   Anterior Vitreous Normal Normal         Fundus Exam       Right Left   Posterior Vitreous Asteroid hyalosis, no sign of PVD Normal, no sign of PVD   Disc Normal Normal   C/D Ratio 0.25 0.25   Macula Pseudocystoid change in the fovea, no visible microaneurysms, no visible right ankle venules, Hard drusen Temporal aspect of the fovea with a small old microaneurysms, no exudate, no crystals retina, Hard drusen   Vessels Normal Normal   Periphery Inferotemporal peripheral retina schisis no inner and outer holes Normal            IMAGING AND PROCEDURES  Imaging and Procedures for 11/09/21  OCT, Retina - OU - Both Eyes       Right Eye Quality was good. Scan locations included subfoveal. Central Foveal Thickness: 246. Progression has been stable. Findings include abnormal foveal contour, cystoid macular edema.   Left Eye Quality was good. Scan locations included subfoveal. Central Foveal Thickness: 244. Progression has been stable. Findings include abnormal foveal contour, cystoid macular edema.   Notes Technically intraretinal Cystoid change which  is not pseudophakic CME but rather cavitary lesions of prior MAC-TEL when it was active, OS now with outer gap in the photoreceptor layer that this could be tractional related Upon the intraretinal cystoid pseudocyst or change from prior MAC-TEL.  Will suggest fluorescein angiography so as to look for active leakage to determine whether this is traction induced thinning of the outer photoreceptor layer left eye or is this active vascular leakage             ASSESSMENT/PLAN:  Type 2 macular telangiectasis, bilateral Macular telangiectasis (MAC-TEL), or parafoveal telangiectasis is a condition of "unknown" cause.  Findings in or near the macula (center of vision) consist of microaneurysms (leaking small capillaries), often with leakage of fluid which in the active phase can impact fine discriminatory vision, and in some cases trigger profound scarring in the macula, with severe permanent vision loss.  Standard treatment is observation and periodic examinations to monitor for treatable complications.   The cause  of this condition is "unknown".  However, the practice of Dr. Luciana Axe has discovered an association with sleep apnea with its nightly periods of low oxygen in the blood stream (hypoxia), retained carbon dioxide (hypercapnia), associated with transient nocturnal hypertensive episodes.   More recently, some patients also been found  to have advanced lung disease, whether asthma or COPD, with similar findings.  Dr. Luciana Axe has been evaluating the association of sleep apnea, nightly hypoxia, and Macular telangiectasis for over 18 years.  Most patients are found to be noncompliant with sleep apnea therapy or testing in the past.  Resumption of CPAP or similar therapy is strongly recommended if ordered in the past.  Upon review of risk factors or findings positive for sleep apnea, more formal, extensive sleep laboratory or home testing, may be recommended.  Numerous patients, proven to have MAC-TEL, have  improved or resolved their ey condition promptly, within weeks, of the use of nighttime oxygen supplementation or continuous positive airway pressure (CPAP).  Patient continues on CPAP since 2017  OD remained also stable, OS with some outer cavitary changes perifoveal, coincident with stable nonthickened nonactive CME from MAC-TEL  I explained to the patient and family that the outer retinal gap present initially August 2022 and still present today may very well be secondary to the to a classic early macular hole formation with tangential traction from epiretinal forces that may lead to this and an macular telangiectasia because of the intrinsic retinal architecture is changed in MAC-TEL.  Nonetheless the MAC-TEL is not active as there is no active macular edema progressing.     ICD-10-CM   1. Type 2 macular telangiectasis, bilateral  H35.073 OCT, Retina - OU - Both Eyes    2. OSA (obstructive sleep apnea)  G47.33       1.  2.  3.  Ophthalmic Meds Ordered this visit:  No orders of the defined types were placed in this encounter.      No follow-ups on file.  There are no Patient Instructions on file for this visit.   Explained the diagnoses, plan, and follow up with the patient and they expressed understanding.  Patient expressed understanding of the importance of proper follow up care.   Alford Highland Barbaraann Avans M.D. Diseases & Surgery of the Retina and Vitreous [facility] 11/09/21     Abbreviations: M myopia (nearsighted); A astigmatism; H hyperopia (farsighted); P presbyopia; Mrx spectacle prescription;  CTL contact lenses; OD right eye; OS left eye; OU both eyes  XT exotropia; ET esotropia; PEK punctate epithelial keratitis; PEE punctate epithelial erosions; DES dry eye syndrome; MGD meibomian gland dysfunction; ATs artificial tears; PFAT's preservative free artificial tears; NSC nuclear sclerotic cataract; PSC posterior subcapsular cataract; ERM epi-retinal  membrane; PVD posterior vitreous detachment; RD retinal detachment; DM diabetes mellitus; DR diabetic retinopathy; NPDR non-proliferative diabetic retinopathy; PDR proliferative diabetic retinopathy; CSME clinically significant macular edema; DME diabetic macular edema; dbh dot blot hemorrhages; CWS cotton wool spot; POAG primary open angle glaucoma; C/D cup-to-disc ratio; HVF humphrey visual field; GVF goldmann visual field; OCT optical coherence tomography; IOP intraocular pressure; BRVO Branch retinal vein occlusion; CRVO central retinal vein occlusion; CRAO central retinal artery occlusion; BRAO branch retinal artery occlusion; RT retinal tear; SB scleral buckle; PPV pars plana vitrectomy; VH Vitreous hemorrhage; PRP panretinal laser photocoagulation; IVK intravitreal kenalog; VMT vitreomacular traction; MH Macular hole;  NVD neovascularization of the disc; NVE neovascularization elsewhere; AREDS age related eye disease study; ARMD age related macular degeneration; POAG primary open angle glaucoma; EBMD epithelial/anterior basement membrane dystrophy; ACIOL anterior chamber intraocular lens; IOL intraocular lens; PCIOL posterior chamber intraocular lens; Phaco/IOL phacoemulsification with intraocular lens placement; PRK photorefractive keratectomy; LASIK laser assisted in situ keratomileusis; HTN hypertension; DM diabetes mellitus; COPD chronic obstructive pulmonary disease

## 2021-11-09 NOTE — Assessment & Plan Note (Addendum)
Macular telangiectasis (MAC-TEL), or parafoveal telangiectasis is a condition of "unknown" cause.  Findings in or near the macula (center of vision) consist of microaneurysms (leaking small capillaries), often with leakage of fluid which in the active phase can impact fine discriminatory vision, and in some cases trigger profound scarring in the macula, with severe permanent vision loss.  Standard treatment is observation and periodic examinations to monitor for treatable complications.   The cause  of this condition is "unknown".  However, the practice of Dr. Zadie Rhine has discovered an association with sleep apnea with its nightly periods of low oxygen in the blood stream (hypoxia), retained carbon dioxide (hypercapnia), associated with transient nocturnal hypertensive episodes.   More recently, some patients also been found to have advanced lung disease, whether asthma or COPD, with similar findings.  Dr. Zadie Rhine has been evaluating the association of sleep apnea, nightly hypoxia, and Macular telangiectasis for over 18 years.  Most patients are found to be noncompliant with sleep apnea therapy or testing in the past.  Resumption of CPAP or similar therapy is strongly recommended if ordered in the past.  Upon review of risk factors or findings positive for sleep apnea, more formal, extensive sleep laboratory or home testing, may be recommended.  Numerous patients, proven to have MAC-TEL, have improved or resolved their ey condition promptly, within weeks, of the use of nighttime oxygen supplementation or continuous positive airway pressure (CPAP). ? ?Patient continues on CPAP since 2017 ? ?OD remained also stable, OS with some outer cavitary changes perifoveal, coincident with stable nonthickened nonactive CME from MAC-TEL ? ?I explained to the patient and family that the outer retinal gap present initially August 2022 and still present today may very well be secondary to the to a classic early macular hole formation  with tangential traction from epiretinal forces that may lead to this and an macular telangiectasia because of the intrinsic retinal architecture is changed in MAC-TEL.  Nonetheless the MAC-TEL is not active as there is no active macular edema progressing. ?

## 2021-11-23 DIAGNOSIS — H25812 Combined forms of age-related cataract, left eye: Secondary | ICD-10-CM | POA: Diagnosis not present

## 2021-11-23 DIAGNOSIS — H52222 Regular astigmatism, left eye: Secondary | ICD-10-CM | POA: Diagnosis not present

## 2021-11-23 DIAGNOSIS — H25012 Cortical age-related cataract, left eye: Secondary | ICD-10-CM | POA: Diagnosis not present

## 2021-11-23 DIAGNOSIS — H2512 Age-related nuclear cataract, left eye: Secondary | ICD-10-CM | POA: Diagnosis not present

## 2021-11-29 DIAGNOSIS — G4733 Obstructive sleep apnea (adult) (pediatric): Secondary | ICD-10-CM | POA: Diagnosis not present

## 2021-12-01 ENCOUNTER — Encounter: Payer: Self-pay | Admitting: Family Medicine

## 2021-12-08 DIAGNOSIS — G4733 Obstructive sleep apnea (adult) (pediatric): Secondary | ICD-10-CM | POA: Diagnosis not present

## 2021-12-12 ENCOUNTER — Encounter: Payer: Self-pay | Admitting: Gastroenterology

## 2021-12-13 ENCOUNTER — Ambulatory Visit: Payer: Medicare PPO | Admitting: Podiatry

## 2021-12-13 ENCOUNTER — Encounter: Payer: Self-pay | Admitting: Podiatry

## 2021-12-13 DIAGNOSIS — M79609 Pain in unspecified limb: Secondary | ICD-10-CM

## 2021-12-13 DIAGNOSIS — L600 Ingrowing nail: Secondary | ICD-10-CM

## 2021-12-13 DIAGNOSIS — G629 Polyneuropathy, unspecified: Secondary | ICD-10-CM

## 2021-12-13 DIAGNOSIS — B351 Tinea unguium: Secondary | ICD-10-CM

## 2021-12-13 NOTE — Progress Notes (Signed)
Complaint:  ?Visit Type: Patient returns to my office for continued preventative foot care services. Complaint: Patient states" my nails in her big toes and ingrown and  painful to walk and wear shoes" . The patient presents for preventative foot care services. No changes to ROS .  Patient says she has numbness and tingling both feet. ? ?Podiatric Exam: ?Vascular: dorsalis pedis and posterior tibial pulses are palpable bilateral. Capillary return is immediate. Temperature gradient is WNL. Skin turgor WNL  ?Sensorium: Normal Semmes Weinstein monofilament test. Normal tactile sensation bilaterally. ?Nail Exam: Pt has thick disfigured discolored nails with subungual debris and pincer toenail right hallux. ?Ulcer Exam: There is no evidence of ulcer or pre-ulcerative changes or infection. ?Orthopedic Exam: Muscle tone and strength are WNL. No limitations in general ROM. No crepitus or effusions noted.. Bony prominences are unremarkable. ?Skin: No Porokeratosis. No infection or ulcers ? ?Diagnosis:  ?Onychomycosis, , Pain in right toe, pain in left toes,  Neuropathy. ? ?Treatment & Plan ?Procedures and Treatment: Consent by patient was obtained for treatment procedures.   Debridement of mycotic and hypertrophic toenails, 1 through 5 bilateral and clearing of subungual debris. No ulceration, no infection noted.  ?Return Visit-Office Procedure: Patient instructed to return to the office for a follow up visit 10 weeks  for continued evaluation and treatment. ? ? ? ?Gardiner Barefoot DPM ?

## 2021-12-20 ENCOUNTER — Encounter: Payer: Self-pay | Admitting: Podiatry

## 2021-12-20 ENCOUNTER — Ambulatory Visit (INDEPENDENT_AMBULATORY_CARE_PROVIDER_SITE_OTHER): Payer: Medicare PPO | Admitting: Podiatry

## 2021-12-20 ENCOUNTER — Ambulatory Visit: Payer: Medicare PPO | Admitting: Podiatry

## 2021-12-20 ENCOUNTER — Ambulatory Visit: Payer: Medicare PPO | Admitting: Dermatology

## 2021-12-20 ENCOUNTER — Encounter: Payer: Self-pay | Admitting: Dermatology

## 2021-12-20 ENCOUNTER — Telehealth: Payer: Self-pay | Admitting: Podiatry

## 2021-12-20 DIAGNOSIS — L570 Actinic keratosis: Secondary | ICD-10-CM | POA: Diagnosis not present

## 2021-12-20 DIAGNOSIS — D044 Carcinoma in situ of skin of scalp and neck: Secondary | ICD-10-CM | POA: Diagnosis not present

## 2021-12-20 DIAGNOSIS — L600 Ingrowing nail: Secondary | ICD-10-CM | POA: Diagnosis not present

## 2021-12-20 DIAGNOSIS — L821 Other seborrheic keratosis: Secondary | ICD-10-CM | POA: Diagnosis not present

## 2021-12-20 DIAGNOSIS — Z1283 Encounter for screening for malignant neoplasm of skin: Secondary | ICD-10-CM | POA: Diagnosis not present

## 2021-12-20 DIAGNOSIS — D485 Neoplasm of uncertain behavior of skin: Secondary | ICD-10-CM

## 2021-12-20 DIAGNOSIS — D0439 Carcinoma in situ of skin of other parts of face: Secondary | ICD-10-CM

## 2021-12-20 DIAGNOSIS — C44612 Basal cell carcinoma of skin of right upper limb, including shoulder: Secondary | ICD-10-CM | POA: Diagnosis not present

## 2021-12-20 DIAGNOSIS — Z85828 Personal history of other malignant neoplasm of skin: Secondary | ICD-10-CM

## 2021-12-20 NOTE — Telephone Encounter (Signed)
That would be fine 

## 2021-12-20 NOTE — Patient Instructions (Signed)

## 2021-12-20 NOTE — Telephone Encounter (Signed)
Pt called stating she had her nails trimmed on 12/13/21, and her R great toe nail was cut too short. She states she's in pain and would like for you to give her a call on what to do next?  ? ?I could also squeeze her in to see you if need be. She states she lives in [place], but will be in [place] today.  ? ?Please advise.  ?

## 2021-12-20 NOTE — Telephone Encounter (Signed)
Spoke with patient, she has an appt set for today.  ?

## 2021-12-20 NOTE — Patient Instructions (Signed)

## 2021-12-21 NOTE — Telephone Encounter (Signed)
Please advise 

## 2021-12-26 ENCOUNTER — Encounter: Payer: Self-pay | Admitting: Podiatry

## 2021-12-26 ENCOUNTER — Ambulatory Visit (AMBULATORY_SURGERY_CENTER): Payer: Medicare PPO | Admitting: *Deleted

## 2021-12-26 ENCOUNTER — Other Ambulatory Visit: Payer: Self-pay

## 2021-12-26 ENCOUNTER — Other Ambulatory Visit: Payer: Self-pay | Admitting: Family Medicine

## 2021-12-26 VITALS — Ht 66.0 in | Wt 167.0 lb

## 2021-12-26 DIAGNOSIS — Z86010 Personal history of colonic polyps: Secondary | ICD-10-CM

## 2021-12-26 MED ORDER — NA SULFATE-K SULFATE-MG SULF 17.5-3.13-1.6 GM/177ML PO SOLN: 1.0000 | Freq: Once | ORAL | 0 refills | Status: AC

## 2021-12-26 MED ORDER — PEG 3350-KCL-NA BICARB-NACL 420 G PO SOLR: 4000.0000 mL | Freq: Once | ORAL | 0 refills | Status: AC

## 2021-12-26 NOTE — Progress Notes (Signed)
No egg or soy allergy known to patient  ?No issues known to pt with past sedation with any surgeries or procedures ?Patient denies ever being told they had issues or difficulty with intubation  ?No FH of Malignant Hyperthermia ?Pt is not on diet pills ?Pt is not on  home 02  ?Pt is not on blood thinners  ?Pt denies issues with constipation  ?No A fib or A flutter ? ?superep Coupon to pt in PV today , Code to Pharmacy and  NO PA's for preps discussed with pt In PV today  ?Discussed with pt there will be an out-of-pocket cost for prep and that varies from $0 to 70 +  dollars - pt verbalized understanding  ?Pt instructed to use Singlecare.com or GoodRx for a price reduction on prep  ? ?PV completed over the phone. Pt verified name, DOB, address and insurance during PV today.  ?Pt mailed instruction packet with copy of consent form to read and not return, and instructions.  ?Pt encouraged to call with questions or issues.  ?If pt has My chart, procedure instructions sent via My Chart   ? ?Pt.requested that suprep and golytely rx be sent to pharmacy. ?

## 2021-12-26 NOTE — Progress Notes (Signed)
?Subjective:  ?Patient ID: Angela [name], female    DOB: 08-Mar-1946,  MRN: [id number] ? ?Chief Complaint  ?Patient presents with  ? Nail Problem  ? ? ?76 y.o. female presents with the above complaint.  Patient presents with right hallux lateral border ingrown.  Patient states is painful to touch has progressive gotten worse.  She states that it is painful to walk and she would like to have removed she has not seen anyone else prior to seeing me she denies any other acute complaints. ? ? ?Review of Systems: Negative except as noted in the HPI. Denies N/V/F/Ch. ? ?Past Medical History:  ?Diagnosis Date  ? Arthritis   ? BCC (basal cell carcinoma of skin) 08/31/1994  ? RIGHT CLAVICLE INFERIOR  ? BCC (basal cell carcinoma of skin) 02/21/1994  ? RIGHT SCAPULA TX CX3 5FU  ? BCC (basal cell carcinoma of skin) 02/21/1994  ? RIGHT CLAVICAL TX CX3 5FU  ? BCC (basal cell carcinoma of skin) 05/25/1994  ? RIGHT SCAPULA TX =MOHS  ? BCC (basal cell carcinoma of skin) 02/02/1999  ? RIGHT FRONT SHOULDER TX CURET X3 ,EXC  ? BCC (basal cell carcinoma of skin) 12/25/1999  ? RIGHT UPPER BACK CX3 5FU  ? BCC (basal cell carcinoma of skin) 03/29/2005  ? RIGHT LATER LOWER LEG TX MOHS  ? BCC (basal cell carcinoma of skin) 11/10/2005  ? LEFT ANT. NECK TX=MOHS  ? BCC (basal cell carcinoma of skin) 08/26/2018  ? RIGHT FRONT SHOULDER TX WITH BX  ? [place] (basal cell carcinoma) 08/31/1994  ? RIGHT SUPER CLAVICLE MEDIAL TX CX3 5FU  ? Breast cancer ([place]) 2009  ? Left Breast Cancer  ? CAD (coronary artery disease)   ? Cataract   ? Essential hypertension   ? Hyperlipidemia   ? Macular retinal cyst of right eye   ? OSA (obstructive sleep apnea)   ? CPAP  ? Osteopenia   ? Personal history of radiation therapy   ? ? ?Current Outpatient Medications:  ?  acetaminophen (TYLENOL) 500 MG tablet, acetaminophen 500 mg tablet  Take 2 tablets every 6 hours by oral route as needed., Disp: , Rfl:  ?  alendronate (FOSAMAX) 70 MG tablet, Take 1 tablet (70 mg total) by  mouth every 7 (seven) days. Take with a full glass of water on an empty stomach., Disp: 5 tablet, Rfl: 11 ?  aspirin 81 MG tablet, Take 81 mg by mouth every evening., Disp: , Rfl:  ?  Calcium Carbonate-Vitamin D 600-400 MG-UNIT tablet, Take 2 tablets by mouth every evening. , Disp: , Rfl:  ?  Cholecalciferol (VITAMIN D3) 50 MCG (2000 UT) capsule, Take 2,000 Units by mouth every evening. , Disp: , Rfl:  ?  Coenzyme Q10 (COQ-10) 100 MG CAPS, Take 100 mg by mouth daily. , Disp: , Rfl:  ?  fish oil-omega-3 fatty acids 1000 MG capsule, Take 1 g by mouth every evening. , Disp: , Rfl:  ?  hydrochlorothiazide (HYDRODIURIL) 25 MG tablet, Take 1 tablet (25 mg total) by mouth daily., Disp: 90 tablet, Rfl: 3 ?  isosorbide mononitrate (IMDUR) 30 MG 24 hr tablet, TAKE 1 TABLET(30 MG) BY MOUTH TWICE DAILY, Disp: 180 tablet, Rfl: 3 ?  ketorolac (ACULAR) 0.4 % SOLN, , Disp: , Rfl:  ?  LAGEVRIO 200 MG CAPS capsule, Take by mouth., Disp: , Rfl:  ?  metFORMIN (GLUCOPHAGE) 500 MG tablet, Take 1 tablet (500 mg total) by mouth 2 (two) times daily with a meal., Disp: 180  tablet, Rfl: 3 ?  metoprolol succinate (TOPROL-XL) 50 MG 24 hr tablet, Take with or immediately following a meal., Disp: 90 tablet, Rfl: 3 ?  moxifloxacin (VIGAMOX) 0.5 % ophthalmic solution, , Disp: , Rfl:  ?  Multiple Vitamin (MULTIVITAMIN) tablet, Take 1 tablet by mouth every evening., Disp: , Rfl:  ?  nitroGLYCERIN (NITROSTAT) 0.4 MG SL tablet, ONE TABLET UNDER TONGUE AS NEEDED FOR CHEST PAIN, Disp: 25 tablet, Rfl: 6 ?  potassium chloride SA (KLOR-CON) 20 MEQ tablet, TAKE 1 TABLET BY MOUTH EVERY DAY, Disp: 90 tablet, Rfl: 3 ?  prednisoLONE acetate (PRED FORTE) 1 % ophthalmic suspension, , Disp: , Rfl:  ?  rosuvastatin (CRESTOR) 40 MG tablet, TAKE 1 TABLET(40 MG) BY MOUTH DAILY, Disp: 90 tablet, Rfl: 3 ?  vitamin C (ASCORBIC ACID) 500 MG tablet, Take 500 mg by mouth every evening. , Disp: , Rfl:  ? ?Social History  ? ?Tobacco Use  ?Smoking Status Never  ?Smokeless  Tobacco Never  ? ? ?Allergies  ?Allergen Reactions  ? Lisinopril Cough  ? Statins Other (See Comments)  ?  Myalgia with lipitor- unproven ?She is able to tolerate simvastatin  ? ?Objective:  ?There were no vitals filed for this visit. ?There is no height or weight on file to calculate BMI. ?Constitutional Well developed. ?Well nourished.  ?Vascular Dorsalis pedis pulses palpable bilaterally. ?Posterior tibial pulses palpable bilaterally. ?Capillary refill normal to all digits.  ?No cyanosis or clubbing noted. ?Pedal hair growth normal.  ?Neurologic Normal speech. ?Oriented to person, place, and time. ?Epicritic sensation to light touch grossly present bilaterally.  ?Dermatologic Painful ingrowing nail at lateral nail borders of the hallux nail right. ?No other open wounds. ?No skin lesions.  ?Orthopedic: Normal joint ROM without pain or crepitus bilaterally. ?No visible deformities. ?No bony tenderness.  ? ?Radiographs: None ?Assessment:  ? ?1. Ingrown toenail of right foot   ? ?Plan:  ?Patient was evaluated and treated and all questions answered. ? ?Ingrown Nail, right ?-Patient elects to proceed with minor surgery to remove ingrown toenail removal today. Consent reviewed and signed by patient. ?-Ingrown nail excised. See procedure note. ?-Educated on post-procedure care including soaking. Written instructions provided and reviewed. ?-Patient to follow up in 2 weeks for nail check. ? ?Procedure: Excision of Ingrown Toenail ?Location: Right 1st toe lateral nail borders. ?Anesthesia: Lidocaine 1% plain; 1.5 mL and Marcaine 0.5% plain; 1.5 mL, digital block. ?Skin Prep: Betadine. ?Dressing: Silvadene; telfa; dry, sterile, compression dressing. ?Technique: Following skin prep, the toe was exsanguinated and a tourniquet was secured at the base of the toe. The affected nail border was freed, split with a nail splitter, and excised. Chemical matrixectomy was then performed with phenol and irrigated out with alcohol. The  tourniquet was then removed and sterile dressing applied. ?Disposition: Patient tolerated procedure well. Patient to return in 2 weeks for follow-up.  ? ?No follow-ups on file. ?

## 2021-12-27 ENCOUNTER — Telehealth: Payer: Self-pay | Admitting: *Deleted

## 2021-12-27 NOTE — Telephone Encounter (Signed)
-----   Message from [name], MD sent at 12/27/2021  6:50 AM EDT ----- ?Schedule surgery with Dr. Darene Lamer ?

## 2021-12-27 NOTE — Telephone Encounter (Signed)
Pathology to patient-surgery appointment scheduled.  

## 2021-12-29 ENCOUNTER — Encounter: Payer: Self-pay | Admitting: Gastroenterology

## 2021-12-30 DIAGNOSIS — G4733 Obstructive sleep apnea (adult) (pediatric): Secondary | ICD-10-CM | POA: Diagnosis not present

## 2022-01-03 DIAGNOSIS — H35352 Cystoid macular degeneration, left eye: Secondary | ICD-10-CM | POA: Diagnosis not present

## 2022-01-07 ENCOUNTER — Encounter: Payer: Self-pay | Admitting: Dermatology

## 2022-01-07 NOTE — Progress Notes (Signed)
? ?  Follow-Up Visit ?  ?Subjective  ?Angela [name] is a 76 y.o. female who presents for the following: Annual Exam (Right temple crusty lesion, personal history of bcc ). ? ?Annual skin check, multiple areas to check ?Location:  ?Duration:  ?Quality:  ?Associated Signs/Symptoms: ?Modifying Factors:  ?Severity:  ?Timing: ?Context:  ? ?Objective  ?Well appearing patient in no apparent distress; mood and affect are within normal limits. ?Full body skin examination: No atypical pigmented lesions (all checked with dermoscopy).  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 ? ?Left Inframammary Fold, Right Abdomen (side) - Upper ?Multiple tan flattopped 3 to 10 mm papules ? ?Right Upper Arm - Anterior ?Focally eroded 7 mm pearly papule ? ? ? ? ? ? ?Right Temporal Scalp ?1.2 cm waxy pink crust ? ? ? ? ?Right Parotid Area ?Multiple flat gritty facial crusts ? ? ? ?A full examination was performed including scalp, head, eyes, ears, nose, lips, neck, chest, axillae, abdomen, back, buttocks, bilateral upper extremities, bilateral lower extremities, hands, feet, fingers, toes, fingernails, and toenails. All findings within normal limits unless otherwise noted below.  Areas beneath undergarments not fully examined ? ? ?Assessment & Plan  ? ? ?Encounter for screening for malignant neoplasm of skin ? ?Annual skin examination. ? ?Seborrheic keratosis (2) ?Right Abdomen (side) - Upper; Left Inframammary Fold ? ?Discussed freezing and elective excision but no procedure scheduled. ? ?Neoplasm of uncertain behavior of skin (2) ?Right Upper Arm - Anterior ? ?Skin / nail biopsy ?Type of biopsy: tangential   ?Informed consent: discussed and consent obtained   ?Timeout: patient name, date of birth, surgical site, and procedure verified   ?Anesthesia: the lesion was anesthetized in a standard fashion   ?Anesthetic:  1% lidocaine w/ epinephrine 1-100,000 local infiltration ?Instrument used: flexible razor blade    ?Hemostasis achieved with: ferric subsulfate and electrodesiccation   ?Outcome: patient tolerated procedure well   ?Post-procedure details: wound care instructions given   ? ?Specimen 1 - Surgical pathology ?Differential Diagnosis: bcc vs scc  ? ?Check Margins: No ? ?Right Temporal Scalp ? ?Skin / nail biopsy ?Type of biopsy: tangential   ?Informed consent: discussed and consent obtained   ?Timeout: patient name, date of birth, surgical site, and procedure verified   ?Anesthesia: the lesion was anesthetized in a standard fashion   ?Anesthetic:  1% lidocaine w/ epinephrine 1-100,000 local infiltration ?Instrument used: flexible razor blade   ?Hemostasis achieved with: ferric subsulfate and electrodesiccation   ?Outcome: patient tolerated procedure well   ?Post-procedure details: wound care instructions given   ? ?Specimen 2 - Surgical pathology ?Differential Diagnosis: bcc vs scc ? ?Check Margins: No ? ?Solar keratosis ?Right Parotid Area ? ?Pdt in the fall  ? ? ? ? ? ?I, [name], MD, have reviewed all documentation for this visit.  The documentation on 01/07/22 for the exam, diagnosis, procedures, and orders are all accurate and complete. ?

## 2022-01-09 ENCOUNTER — Encounter: Payer: Medicare PPO | Admitting: Gastroenterology

## 2022-01-09 ENCOUNTER — Encounter: Payer: Self-pay | Admitting: Gastroenterology

## 2022-01-09 ENCOUNTER — Ambulatory Visit (AMBULATORY_SURGERY_CENTER): Payer: Medicare PPO | Admitting: Gastroenterology

## 2022-01-09 VITALS — BP 118/79 | HR 61 | Temp 97.7°F | Resp 25 | Ht 65.35 in | Wt 167.0 lb

## 2022-01-09 DIAGNOSIS — Z86010 Personal history of colonic polyps: Secondary | ICD-10-CM

## 2022-01-09 DIAGNOSIS — D120 Benign neoplasm of cecum: Secondary | ICD-10-CM | POA: Diagnosis not present

## 2022-01-09 DIAGNOSIS — D122 Benign neoplasm of ascending colon: Secondary | ICD-10-CM | POA: Diagnosis not present

## 2022-01-09 DIAGNOSIS — K635 Polyp of colon: Secondary | ICD-10-CM | POA: Diagnosis not present

## 2022-01-09 MED ORDER — SODIUM CHLORIDE 0.9 % IV SOLN: 500.0000 mL | Freq: Once | INTRAVENOUS | Status: DC

## 2022-01-09 NOTE — Progress Notes (Signed)
Report to pacu rn; vss. ?

## 2022-01-09 NOTE — Progress Notes (Signed)
Vs DT ?Pt's states no medical or surgical changes since previsit or office visit. ? ?

## 2022-01-09 NOTE — Patient Instructions (Signed)
Handout on polyps and diverticulosis given    YOU HAD AN ENDOSCOPIC PROCEDURE TODAY AT THE [facility]:   Refer to the procedure report that was given to you for any specific questions about what was found during the examination.  If the procedure report does not answer your questions, please call your gastroenterologist to clarify.  If you requested that your care partner not be given the details of your procedure findings, then the procedure report has been included in a sealed envelope for you to review at your convenience later.  YOU SHOULD EXPECT: Some feelings of bloating in the abdomen. Passage of more gas than usual.  Walking can help get rid of the air that was put into your GI tract during the procedure and reduce the bloating. If you had a lower endoscopy (such as a colonoscopy or flexible sigmoidoscopy) you may notice spotting of blood in your stool or on the toilet paper. If you underwent a bowel prep for your procedure, you may not have a normal bowel movement for a few days.  Please Note:  You might notice some irritation and congestion in your nose or some drainage.  This is from the oxygen used during your procedure.  There is no need for concern and it should clear up in a day or so.  SYMPTOMS TO REPORT IMMEDIATELY:   Following lower endoscopy (colonoscopy or flexible sigmoidoscopy):  Excessive amounts of blood in the stool  Significant tenderness or worsening of abdominal pains  Swelling of the abdomen that is new, acute  Fever of 100F or higher   For urgent or emergent issues, a gastroenterologist can be reached at any hour by calling [phone]. Do not use MyChart messaging for urgent concerns.    DIET:  We do recommend a small meal at first, but then you may proceed to your regular diet.  Drink plenty of fluids but you should avoid alcoholic beverages for 24 hours.  ACTIVITY:  You should plan to take it easy for the rest of today and you should NOT  DRIVE or use heavy machinery until tomorrow (because of the sedation medicines used during the test).    FOLLOW UP: Our staff will call the number listed on your records 48-72 hours following your procedure to check on you and address any questions or concerns that you may have regarding the information given to you following your procedure. If we do not reach you, we will leave a message.  We will attempt to reach you two times.  During this call, we will ask if you have developed any symptoms of COVID 19. If you develop any symptoms (ie: fever, flu-like symptoms, shortness of breath, cough etc.) before then, please call [phone].  If you test positive for Covid 19 in the 2 weeks post procedure, please call and report this information to us.    If any biopsies were taken you will be contacted by phone or by letter within the next 1-3 weeks.  Please call us at [phone] if you have not heard about the biopsies in 3 weeks.    SIGNATURES/CONFIDENTIALITY: You and/or your care partner have signed paperwork which will be entered into your electronic medical record.  These signatures attest to the fact that that the information above on your After Visit Summary has been reviewed and is understood.  Full responsibility of the confidentiality of this discharge information lies with you and/or your care-partner. 

## 2022-01-09 NOTE — Progress Notes (Signed)
Called to room to assist during endoscopic procedure.  Patient ID and intended procedure confirmed with present staff. Received instructions for my participation in the procedure from the performing physician.  

## 2022-01-09 NOTE — Op Note (Signed)
[facility] ?Patient Name: Angela [name] ?Procedure Date: 01/09/2022 2:42 PM ?MRN: [id number] ?Endoscopist: Mauri Pole , MD ?Age: 76 ?Referring MD:  ?Date of Birth: [birth year] ?Gender: Female ?Account #: [id number] ?Procedure:                Colonoscopy ?Indications:              High risk colon cancer surveillance: Personal  ?                          history of colonic polyps, High risk colon cancer  ?                          surveillance: Personal history of adenoma (10 mm or  ?                          greater in size), High risk colon cancer  ?                          surveillance: Personal history of multiple (3 or  ?                          more) adenomas ?Medicines:                Monitored Anesthesia Care ?Procedure:                Pre-Anesthesia Assessment: ?                          - Prior to the procedure, a History and Physical  ?                          was performed, and patient medications and  ?                          allergies were reviewed. The patient's tolerance of  ?                          previous anesthesia was also reviewed. The risks  ?                          and benefits of the procedure and the sedation  ?                          options and risks were discussed with the patient.  ?                          All questions were answered, and informed consent  ?                          was obtained. Prior Anticoagulants: The patient has  ?                          taken no previous anticoagulant or antiplatelet  ?  agents. ASA Grade Assessment: II - A patient with  ?                          mild systemic disease. After reviewing the risks  ?                          and benefits, the patient was deemed in  ?                          satisfactory condition to undergo the procedure. ?                          After obtaining informed consent, the colonoscope  ?                          was passed under direct vision. Throughout the  ?                           procedure, the patient's blood pressure, pulse, and  ?                          oxygen saturations were monitored continuously. The  ?                          Olympus PCF-H190DL (#[id number]) Colonoscope was  ?                          introduced through the anus and advanced to the the  ?                          cecum, identified by appendiceal orifice and  ?                          ileocecal valve. The colonoscopy was performed  ?                          without difficulty. The patient tolerated the  ?                          procedure well. The quality of the bowel  ?                          preparation was good. The ileocecal valve,  ?                          appendiceal orifice, and rectum were photographed. ?Scope In: 2:49:55 PM ?Scope Out: 3:04:37 PM ?Scope Withdrawal Time: 0 hours 8 minutes 35 seconds  ?Total Procedure Duration: 0 hours 14 minutes 42 seconds  ?Findings:                 The perianal and digital rectal examinations were  ?                          normal. ?  Two sessile polyps were found in the ascending  ?                          colon and cecum. The polyps were 5 to 6 mm in size.  ?                          These polyps were removed with a cold snare.  ?                          Resection and retrieval were complete. ?                          Scattered small and large-mouthed diverticula were  ?                          found in the sigmoid colon, descending colon,  ?                          transverse colon and ascending colon. ?                          Non-bleeding external and internal hemorrhoids were  ?                          found during retroflexion. The hemorrhoids were  ?                          medium-sized. ?Complications:            No immediate complications. ?Estimated Blood Loss:     Estimated blood loss was minimal. ?Impression:               - Two 5 to 6 mm polyps in the ascending colon and  ?                          in  the cecum, removed with a cold snare. Resected  ?                          and retrieved. ?                          - Moderate diverticulosis in the sigmoid colon, in  ?                          the descending colon, in the transverse colon and  ?                          in the ascending colon. ?                          - Non-bleeding external and internal hemorrhoids. ?Recommendation:           - Patient has a contact number available for  ?  emergencies. The signs and symptoms of potential  ?                          delayed complications were discussed with the  ?                          patient. Return to normal activities tomorrow.  ?                          Written discharge instructions were provided to the  ?                          patient. ?                          - Resume previous diet. ?                          - Continue present medications. ?                          - Await pathology results. ?                          - No repeat colonoscopy due to age. ?Mauri Pole, MD ?01/09/2022 3:08:00 PM ?This report has been signed electronically. ?

## 2022-01-09 NOTE — Progress Notes (Signed)
[facility] History and Physical ? ? ?Primary Care Physician:  Marin Olp, MD ? ? ?Reason for Procedure:  History of adenomatous colon polyps ? ?Plan:    Surveillance colonoscopy with possible interventions as needed ? ? ? ? ?HPI: Angela [name] is a very pleasant 76 y.o. female here for surveillance colonoscopy. ?Denies any nausea, vomiting, abdominal pain, melena or bright red blood per rectum ? ?The risks and benefits as well as alternatives of endoscopic procedure(s) have been discussed and reviewed. All questions answered. The patient agrees to proceed. ? ? ? ?Past Medical History:  ?Diagnosis Date  ? Allergy   ? seasonal  ? Arthritis   ? BCC (basal cell carcinoma of skin) 08/31/1994  ? RIGHT CLAVICLE INFERIOR  ? BCC (basal cell carcinoma of skin) 02/21/1994  ? RIGHT SCAPULA TX CX3 5FU  ? BCC (basal cell carcinoma of skin) 02/21/1994  ? RIGHT CLAVICAL TX CX3 5FU  ? BCC (basal cell carcinoma of skin) 05/25/1994  ? RIGHT SCAPULA TX =MOHS  ? BCC (basal cell carcinoma of skin) 02/02/1999  ? RIGHT FRONT SHOULDER TX CURET X3 ,EXC  ? BCC (basal cell carcinoma of skin) 12/25/1999  ? RIGHT UPPER BACK CX3 5FU  ? BCC (basal cell carcinoma of skin) 03/29/2005  ? RIGHT LATER LOWER LEG TX MOHS  ? BCC (basal cell carcinoma of skin) 11/10/2005  ? LEFT ANT. NECK TX=MOHS  ? BCC (basal cell carcinoma of skin) 08/26/2018  ? RIGHT FRONT SHOULDER TX WITH BX  ? [place] (basal cell carcinoma) 08/31/1994  ? RIGHT SUPER CLAVICLE MEDIAL TX CX3 5FU  ? Breast cancer ([place]) 2009  ? Left Breast Cancer  ? CAD (coronary artery disease)   ? Cataract   ? Diabetes mellitus without complication ([place])   ? Essential hypertension   ? Hyperlipidemia   ? Macular retinal cyst of right eye   ? OSA (obstructive sleep apnea)   ? CPAP  ? Osteopenia   ? Osteopenia   ? Personal history of radiation therapy   ? Sleep apnea   ? ? ?Past Surgical History:  ?Procedure Laterality Date  ? BREAST EXCISIONAL BIOPSY Right   ? BREAST LUMPECTOMY Left  06/2008  ? CATARACT EXTRACTION  2012  ? COLONOSCOPY    ? Eyelid surgery Bilateral   ? INTRAVASCULAR PRESSURE WIRE/FFR STUDY N/A 06/01/2019  ? Procedure: INTRAVASCULAR PRESSURE WIRE/FFR STUDY;  Surgeon: Belva Crome, MD;  Location: [facility];  Service: Cardiovascular;  Laterality: N/A;  ? LEFT HEART CATH AND CORONARY ANGIOGRAPHY N/A 06/01/2019  ? Procedure: LEFT HEART CATH AND CORONARY ANGIOGRAPHY;  Surgeon: Belva Crome, MD;  Location: [facility];  Service: Cardiovascular;  Laterality: N/A;  ? MASS EXCISION  03/2008  ? Appendix  ? MOHS SURGERY    ? POLYPECTOMY    ? SKIN CANCER EXCISION    ? TONSILLECTOMY    ? ? ?Prior to Admission medications   ?Medication Sig Start Date End Date Taking? Authorizing Provider  ?acetaminophen (TYLENOL) 500 MG tablet acetaminophen 500 mg tablet ? Take 2 tablets every 6 hours by oral route as needed.   Yes [provider]  ?alendronate (FOSAMAX) 70 MG tablet TAKE 1 TABLET BY MOUTH EVERY 7 DAYS WITH A FULL GLASS OF WATER ON AN EMPTY STOMACH 12/27/21  Yes Marin Olp, MD  ?aspirin 81 MG tablet Take 81 mg by mouth every evening.   Yes [provider]  ?Bromfenac Sodium 0.09 % SOLN  01/04/22  Yes [provider]  ?Calcium Carbonate-Vitamin D 600-400 MG-UNIT tablet Take 2 tablets by mouth every evening.    Yes [provider]  ?Cholecalciferol (VITAMIN D3) 50 MCG (2000 UT) capsule Take 2,000 Units by mouth every evening.    Yes [provider]  ?Coenzyme Q10 (COQ-10) 100 MG CAPS Take 100 mg by mouth daily.    Yes [provider]  ?fish oil-omega-3 fatty acids 1000 MG capsule Take 1 g by mouth every evening.    Yes [provider]  ?hydrochlorothiazide (HYDRODIURIL) 25 MG tablet Take 1 tablet (25 mg total) by mouth daily. 08/24/21  Yes Marin Olp, MD  ?isosorbide mononitrate (IMDUR) 30 MG 24 hr tablet TAKE 1 TABLET(30 MG) BY MOUTH TWICE DAILY ?Patient taking differently: daily. 01/12/21  Yes Satira Sark, MD  ?metFORMIN (GLUCOPHAGE) 500 MG tablet Take 1 tablet (500 mg total) by mouth 2 (two) times daily with a meal. 08/24/21  Yes Marin Olp, MD  ?metoprolol succinate (TOPROL-XL) 50 MG 24 hr tablet Take with or immediately following a meal. 08/24/21  Yes Marin Olp, MD  ?Multiple Vitamin (MULTIVITAMIN) tablet Take 1 tablet by mouth every evening.   Yes [provider]  ?potassium chloride SA (KLOR-CON) 20 MEQ tablet TAKE 1 TABLET BY MOUTH EVERY DAY 01/05/21  Yes Satira Sark, MD  ?rosuvastatin (CRESTOR) 40 MG tablet TAKE 1 TABLET(40 MG) BY MOUTH DAILY 01/05/21  Yes Satira Sark, MD  ?vitamin C (ASCORBIC ACID) 500 MG tablet Take 500 mg by mouth every evening.    Yes [provider]  ?nitroGLYCERIN (NITROSTAT) 0.4 MG SL tablet ONE TABLET UNDER TONGUE AS NEEDED FOR CHEST PAIN ?Patient not taking: Reported on 12/26/2021 10/04/21   Satira Sark, MD  ? ? ?Current Outpatient Medications  ?Medication Sig Dispense Refill  ? acetaminophen (TYLENOL) 500 MG tablet acetaminophen 500 mg tablet ? Take 2 tablets every 6 hours by oral route as needed.    ? alendronate (FOSAMAX) 70 MG tablet TAKE 1 TABLET BY MOUTH EVERY 7 DAYS WITH A FULL GLASS OF WATER ON AN EMPTY STOMACH 4 tablet 5  ? aspirin 81 MG tablet Take 81 mg by mouth every evening.    ? Bromfenac Sodium 0.09 % SOLN     ? Calcium Carbonate-Vitamin D 600-400 MG-UNIT tablet Take 2 tablets by mouth every evening.     ? Cholecalciferol (VITAMIN D3) 50 MCG (2000 UT) capsule Take 2,000 Units by mouth every evening.     ? Coenzyme Q10 (COQ-10) 100 MG CAPS Take 100 mg by mouth daily.     ? fish oil-omega-3 fatty acids 1000 MG capsule Take 1 g by mouth every evening.     ? hydrochlorothiazide (HYDRODIURIL) 25 MG tablet Take 1 tablet (25 mg total) by mouth daily. 90 tablet 3  ? isosorbide mononitrate (IMDUR) 30 MG 24 hr tablet TAKE 1 TABLET(30 MG) BY MOUTH TWICE DAILY (Patient taking differently: daily.) 180 tablet 3  ? metFORMIN  (GLUCOPHAGE) 500 MG tablet Take 1 tablet (500 mg total) by mouth 2 (two) times daily with a meal. 180 tablet 3  ? metoprolol succinate (TOPROL-XL) 50 MG 24 hr tablet Take with or immediately following a meal. 90 tablet 3  ? Multiple Vitamin (MULTIVITAMIN) tablet Take 1 tablet by mouth every evening.    ? potassium chloride SA (KLOR-CON) 20 MEQ tablet TAKE 1 TABLET BY MOUTH EVERY DAY 90 tablet 3  ? rosuvastatin (CRESTOR) 40 MG tablet TAKE 1 TABLET(40  MG) BY MOUTH DAILY 90 tablet 3  ? vitamin C (ASCORBIC ACID) 500 MG tablet Take 500 mg by mouth every evening.     ? nitroGLYCERIN (NITROSTAT) 0.4 MG SL tablet ONE TABLET UNDER TONGUE AS NEEDED FOR CHEST PAIN (Patient not taking: Reported on 12/26/2021) 25 tablet 6  ? ?Current Facility-Administered Medications  ?Medication Dose Route Frequency Provider Last Rate Last Admin  ? 0.9 %  sodium chloride infusion  500 mL Intravenous Once Tahjay Binion, Venia Minks, MD      ? ? ?Allergies as of 01/09/2022 - Review Complete 01/09/2022  ?Allergen Reaction Noted  ? Lisinopril Cough 03/02/2013  ? Statins Other (See Comments) 07/13/2013  ? ? ?Family History  ?Problem Relation Age of Onset  ? Heart attack Mother   ? Hypertension Mother   ? Heart disease Father   ? Heart attack Father   ? Hypertension Father   ? Colon cancer Neg Hx   ? Stroke Neg Hx   ? Esophageal cancer Neg Hx   ? Rectal cancer Neg Hx   ? Stomach cancer Neg Hx   ? Colon polyps Neg Hx   ? Crohn's disease Neg Hx   ? ? ?Social History  ? ?Socioeconomic History  ? Marital status: Married  ?  Spouse name: Not on file  ? Number of children: 3  ? Years of education: Not on file  ? Highest education level: Not on file  ?Occupational History  ? Occupation: retired  ?  Employer: RETIRED  ?  Comment: works 1 day a week  ?Tobacco Use  ? Smoking status: Never  ?  Passive exposure: Never  ? Smokeless tobacco: Never  ?Vaping Use  ? Vaping Use: Never used  ?Substance and Sexual Activity  ? Alcohol use: Yes  ?  Alcohol/week: 3.0 standard  drinks  ?  Types: 3 Glasses of wine per week  ?  Comment: one glass a week  ? Drug use: No  ? Sexual activity: Not on file  ?Other Topics Concern  ? Not on file  ?Social History Narrative  ? Married. 2 daughter

## 2022-01-11 ENCOUNTER — Telehealth: Payer: Self-pay

## 2022-01-11 NOTE — Telephone Encounter (Signed)
?  Follow up Call- ? ? ?  01/09/2022  ?  1:45 PM  ?Call back number  ?Post procedure Call Back phone  # [phone]  ?Permission to leave phone message Yes  ?  ? ?Patient questions: ? ?Do you have a fever, pain , or abdominal swelling? No. ?Pain Score  0 * ? ?Have you tolerated food without any problems? Yes.   ? ?Have you been able to return to your normal activities? Yes.   ? ?Do you have any questions about your discharge instructions: ?Diet   No. ?Medications  No. ?Follow up visit  No. ? ?Do you have questions or concerns about your Care? No. ? ?Actions: ?* If pain score is 4 or above: ?No action needed, pain <4. ? ? ?

## 2022-01-12 ENCOUNTER — Other Ambulatory Visit: Payer: Self-pay | Admitting: Cardiology

## 2022-01-19 ENCOUNTER — Encounter: Payer: Self-pay | Admitting: Gastroenterology

## 2022-01-25 ENCOUNTER — Encounter: Payer: Self-pay | Admitting: Dermatology

## 2022-01-25 ENCOUNTER — Ambulatory Visit (INDEPENDENT_AMBULATORY_CARE_PROVIDER_SITE_OTHER): Payer: Medicare PPO | Admitting: Dermatology

## 2022-01-25 DIAGNOSIS — C44612 Basal cell carcinoma of skin of right upper limb, including shoulder: Secondary | ICD-10-CM | POA: Diagnosis not present

## 2022-01-25 DIAGNOSIS — D099 Carcinoma in situ, unspecified: Secondary | ICD-10-CM | POA: Diagnosis not present

## 2022-01-25 NOTE — Patient Instructions (Addendum)
Biopsy, Surgery (Curettage) & Surgery (Excision) Aftercare Instructions  1. Okay to remove bandage in 24 hours  2. Wash area with soap and water  3. Apply Vaseline to area twice daily until healed (Not Neosporin)  4. Okay to cover with a Band-Aid to decrease the chance of infection or prevent irritation from clothing; also it's okay to uncover lesion at home.  5. Suture instructions: return to our office in 7-10 or 10-14 days for a nurse visit for suture removal. Variable healing with sutures, if pain or itching occurs call our office. It's okay to shower or bathe 24 hours after sutures are given.  6. The following risks may occur after a biopsy, curettage or excision: bleeding, scarring, discoloration, recurrence, infection (redness, yellow drainage, pain or swelling).  7. For questions, concerns and results call our office at [place] before 4pm & Friday before 3pm. Biopsy results will be available in 1 week. Levulan/PDT Treatment Common Side Effects  - Burning/stinging, which may be severe and last up to 24-72 hours after your treatment  - Redness, swelling and/or peeling which may last up to 4 weeks  - Scaling/crusting which may last up to 4-6 weeks  - Sun sensitivity  Care Instructions  - Okay to wash with soap and water and shampoo as normal  - If needed, you can do a cold compress (ex. Ice packs) for comfort  - If okay with your Primary Doctor, you may use analgesics such as Tylenol every 4-6 hours, not to exceed recommended dose  - You may apply Cerave Healing Ointment, Vaseline or Aquaphor  - If you have a lot of swelling you may take a Benadryl to help with this  Sun Precautions  - Avoid all sun exposure for 2 days  - Wear a wide brim hat for the next week if outside  - Wear a sunblock with titanium dioxide greater than 5% daily   We will recheck you in 10-12 weeks. If any problems, please call the office and ask to speak with a nurse.

## 2022-01-26 ENCOUNTER — Ambulatory Visit
Admission: RE | Admit: 2022-01-26 | Discharge: 2022-01-26 | Disposition: A | Discharge status: Home / Self Care | Payer: Medicare PPO | Source: Ambulatory Visit | Attending: Family Medicine | Admitting: Family Medicine

## 2022-01-26 VITALS — BP 173/82 | HR 62 | Temp 98.7°F | Resp 18

## 2022-01-26 DIAGNOSIS — N390 Urinary tract infection, site not specified: Secondary | ICD-10-CM | POA: Diagnosis not present

## 2022-01-26 LAB — POCT URINALYSIS DIP (MANUAL ENTRY)
Bilirubin, UA: NEGATIVE
Blood, UA: NEGATIVE
Glucose, UA: NEGATIVE mg/dL
Ketones, POC UA: NEGATIVE mg/dL
Nitrite, UA: NEGATIVE
Protein Ur, POC: NEGATIVE mg/dL
Spec Grav, UA: 1.015 (ref 1.010–1.025)
Urobilinogen, UA: 0.2 E.U./dL
pH, UA: 6 (ref 5.0–8.0)

## 2022-01-26 MED ORDER — CEPHALEXIN 500 MG PO CAPS: 500.0000 mg | ORAL_CAPSULE | Freq: Two times a day (BID) | ORAL | 0 refills | Status: DC

## 2022-01-26 NOTE — ED Triage Notes (Signed)
UTI symptoms since last Thursday.  Pressure last night.  AZO home test for UTI was positive last night.

## 2022-01-26 NOTE — ED Provider Notes (Signed)
[facility]    CSN: [id number] Arrival date & time: 01/26/22  1014      History   Chief Complaint Chief Complaint  Patient presents with   Urinary Tract Infection    HPI Angela [name] is a 76 y.o. female.   Presenting today with almost a week of waxing and waning suprapubic pressure, dysuria, urinary frequency.  States she took some cranberry pills and Azo which seemed to relieve things for short period of time but symptoms came back last night.  Took a home urinary tract infection test that came back positive.  Denies fever, chills, nausea, vomiting, vaginal symptoms.  History of urinary tract infections about once a year per patient.   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 NECK TX=MOHS   BCC (basal cell carcinoma of skin) 08/26/2018   RIGHT FRONT SHOULDER TX WITH BX   BCC (basal cell carcinoma) 08/31/1994   RIGHT SUPER CLAVICLE MEDIAL TX CX3 5FU   Breast cancer ([place]) 2009   Left Breast Cancer   CAD (coronary artery disease)    Cataract    Diabetes mellitus without complication ([place])    Essential hypertension    Hyperlipidemia    Macular retinal cyst of right eye    OSA (obstructive sleep apnea)    CPAP   Osteopenia    Osteopenia    Personal history of radiation therapy    Sleep apnea     Patient Active Problem List    Diagnosis Date Noted   Dysuria 07/17/2021   Cystitis 07/17/2021   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   Type 2 macular telangiectasis, 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   Nuclear sclerotic cataract of left eye 03/08/2020   Numbness and tingling of both feet 11/10/2019   Neuropathy 11/10/2019   CAD (coronary artery disease) 07/24/2019   History of colon polyps 06/12/2018   History of skin cancer 02/21/2016   Cortical age-related cataract of left eye 09/14/2015   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 09/14/2015   OSA (obstructive sleep apnea) 08/24/2015   Hyperglycemia 11/12/2014   Chest discomfort 10/05/2014   Osteoporosis-due to prior treatment with Prolia/Fosamax 06/04/2014   Cataract 07/16/2012   Macular retinal cyst 07/16/2012   Pseudophakia of right eye 07/16/2012   History of breast cancer 03/20/2011   Malignant neoplasm of breast (female) ([place]) 03/20/2011   HTN (hypertension) 07/13/2008   Vitamin D deficiency 07/09/2007   Hyperlipidemia 07/09/2007    Past Surgical History:  Procedure Laterality Date   BREAST EXCISIONAL BIOPSY Right    BREAST LUMPECTOMY Left 06/2008   CATARACT EXTRACTION  2012   COLONOSCOPY     Eyelid surgery Bilateral  INTRAVASCULAR PRESSURE WIRE/FFR STUDY N/A 06/01/2019   Procedure: INTRAVASCULAR PRESSURE WIRE/FFR STUDY;  Surgeon: Belva Crome, MD;  Location: [facility];  Service: Cardiovascular;  Laterality: N/A;   LEFT HEART CATH AND CORONARY ANGIOGRAPHY N/A 06/01/2019   Procedure: LEFT HEART CATH AND CORONARY ANGIOGRAPHY;  Surgeon: Belva Crome, MD;  Location: [facility];  Service: Cardiovascular;  Laterality: N/A;   MASS EXCISION  03/2008   Appendix   MOHS SURGERY     POLYPECTOMY     SKIN CANCER EXCISION     TONSILLECTOMY      OB History     Gravida  3   Para  2   Term  2   Preterm      AB      Living  2      SAB      IAB      Ectopic      Multiple       Live Births               Home Medications    Prior to Admission medications   Medication Sig Start Date End Date Taking? Authorizing Provider  cephALEXin (KEFLEX) 500 MG capsule Take 1 capsule (500 mg total) by mouth 2 (two) times daily. 01/26/22  Yes Volney American, PA-C  acetaminophen (TYLENOL) 500 MG tablet acetaminophen 500 mg tablet  Take 2 tablets every 6 hours by oral route as needed.    [provider]  alendronate (FOSAMAX) 70 MG tablet TAKE 1 TABLET BY MOUTH EVERY 7 DAYS WITH A FULL GLASS OF WATER ON AN EMPTY STOMACH 12/27/21   Marin Olp, MD  aspirin 81 MG tablet Take 81 mg by mouth every evening.    [provider]  Bromfenac Sodium 0.09 % SOLN  01/04/22   [provider]  Calcium Carbonate-Vitamin D 600-400 MG-UNIT tablet Take 2 tablets by mouth every evening.     [provider]  Cholecalciferol (VITAMIN D3) 50 MCG (2000 UT) capsule Take 2,000 Units by mouth every evening.     [provider]  Coenzyme Q10 (COQ-10) 100 MG CAPS Take 100 mg by mouth daily.     [provider]  fish oil-omega-3 fatty acids 1000 MG capsule Take 1 g by mouth every evening.     [provider]  hydrochlorothiazide (HYDRODIURIL) 25 MG tablet Take 1 tablet (25 mg total) by mouth daily. 08/24/21   Marin Olp, MD  isosorbide mononitrate (IMDUR) 30 MG 24 hr tablet TAKE 1 TABLET(30 MG) BY MOUTH TWICE DAILY Patient taking differently: daily. 01/12/21   Satira Sark, MD  metFORMIN (GLUCOPHAGE) 500 MG tablet Take 1 tablet (500 mg total) by mouth 2 (two) times daily with a meal. 08/24/21   Marin Olp, MD  metoprolol succinate (TOPROL-XL) 50 MG 24 hr tablet Take with or immediately following a meal. 08/24/21   Marin Olp, MD  Multiple Vitamin (MULTIVITAMIN) tablet Take 1 tablet by mouth every evening.    [provider]  nitroGLYCERIN (NITROSTAT) 0.4 MG SL tablet ONE TABLET UNDER TONGUE AS NEEDED  FOR CHEST PAIN 10/04/21   Satira Sark, MD  potassium chloride SA (KLOR-CON) 20 MEQ tablet TAKE 1 TABLET BY MOUTH EVERY DAY 01/05/21   Satira Sark, MD  rosuvastatin (CRESTOR) 40 MG tablet TAKE 1 TABLET(40 MG) BY MOUTH DAILY 01/12/22   Satira Sark, MD  vitamin C (ASCORBIC ACID) 500 MG tablet Take  500 mg by mouth every evening.     [provider]    Family History Family History  Problem Relation Age of Onset   Heart attack Mother    Hypertension Mother    Heart disease Father    Heart attack Father    Hypertension Father    Colon cancer Neg Hx    Stroke Neg Hx    Esophageal cancer Neg Hx    Rectal cancer Neg Hx    Stomach cancer Neg Hx    Colon polyps Neg Hx    Crohn's disease Neg Hx     Social History Social History   Tobacco Use   Smoking status: Never    Passive exposure: Never   Smokeless tobacco: Never  Vaping Use   Vaping Use: Never used  Substance Use Topics   Alcohol use: Yes    Alcohol/week: 3.0 standard drinks    Types: 3 Glasses of wine per week    Comment: one glass a week   Drug use: No     Allergies   Lisinopril and Statins   Review of Systems Review of Systems Per HPI  Physical Exam Triage Vital Signs ED Triage Vitals  Enc Vitals Group     BP 01/26/22 1108 (!) 173/82     Pulse Rate 01/26/22 1108 62     Resp 01/26/22 1108 18     Temp 01/26/22 1108 98.7 F (37.1 C)     Temp Source 01/26/22 1108 Oral     SpO2 01/26/22 1108 94 %     Weight --      Height --      Head Circumference --      Peak Flow --      Pain Score 01/26/22 1109 0     Pain Loc --      Pain Edu? --      Excl. in [place]? --    No data found.  Updated Vital Signs BP (!) 173/82 (BP Location: Right Arm)   Pulse 62   Temp 98.7 F (37.1 C) (Oral)   Resp 18   LMP  (LMP Unknown)   SpO2 94%   Visual Acuity Right Eye Distance:   Left Eye Distance:   Bilateral Distance:    Right Eye Near:   Left Eye Near:    Bilateral Near:     Physical  Exam Vitals and nursing note reviewed.  Constitutional:      Appearance: Normal appearance. She is not ill-appearing.  HENT:     Head: Atraumatic.  Eyes:     Extraocular Movements: Extraocular movements intact.     Conjunctiva/sclera: Conjunctivae normal.  Cardiovascular:     Rate and Rhythm: Normal rate and regular rhythm.     Heart sounds: Normal heart sounds.  Pulmonary:     Effort: Pulmonary effort is normal.     Breath sounds: Normal breath sounds.  Abdominal:     General: Bowel sounds are normal. There is no distension.     Palpations: Abdomen is soft.     Tenderness: There is no abdominal tenderness. There is no right CVA tenderness, left CVA tenderness or guarding.  Musculoskeletal:        General: Normal range of motion.     Cervical back: Normal range of motion and neck supple.  Skin:    General: Skin is warm and dry.  Neurological:     Mental Status: She is alert and oriented to person, place, and time.  Psychiatric:  Mood and Affect: Mood normal.        Thought Content: Thought content normal.        Judgment: Judgment normal.     UC Treatments / Results  Labs (all labs ordered are listed, but only abnormal results are displayed) Labs Reviewed  POCT URINALYSIS DIP (MANUAL ENTRY) - Abnormal; Notable for the following components:      Result Value   Color, UA yellow (*)    Leukocytes, UA Trace (*)    All other components within normal limits  URINE CULTURE    EKG   Radiology No results found.  Procedures Procedures (including critical care time)  Medications Ordered in UC Medications - No data to display  Initial Impression / Assessment and Plan / UC Course  I have reviewed the triage vital signs and the nursing notes.  Pertinent labs & imaging results that were available during my care of the patient were reviewed by me and considered in my medical decision making (see chart for details).     Trace leukocytes in urinalysis today, urine  culture pending.  Given her history of urinary tract infections and consistent symptoms, will start Keflex, increase fluids and continue Azo as needed.  Adjust as needed based on the culture results.  Final Clinical Impressions(s) / UC Diagnoses   Final diagnoses:  Acute lower UTI   Discharge Instructions   None    ED Prescriptions     Medication Sig Dispense Auth. Provider   cephALEXin (KEFLEX) 500 MG capsule Take 1 capsule (500 mg total) by mouth 2 (two) times daily. 10 capsule Volney American, [place]      PDMP not reviewed this encounter.   Volney American, [place] 01/26/22 1143

## 2022-01-29 DIAGNOSIS — G4733 Obstructive sleep apnea (adult) (pediatric): Secondary | ICD-10-CM | POA: Diagnosis not present

## 2022-01-29 LAB — URINE CULTURE: Culture: 50000 — AB

## 2022-02-03 ENCOUNTER — Ambulatory Visit
Admission: EM | Admit: 2022-02-03 | Discharge: 2022-02-03 | Disposition: A | Discharge status: Home / Self Care | Payer: Medicare PPO | Attending: Family Medicine | Admitting: Family Medicine

## 2022-02-03 DIAGNOSIS — N390 Urinary tract infection, site not specified: Secondary | ICD-10-CM | POA: Insufficient documentation

## 2022-02-03 LAB — POCT URINALYSIS DIP (MANUAL ENTRY)
Bilirubin, UA: NEGATIVE
Glucose, UA: NEGATIVE mg/dL
Ketones, POC UA: NEGATIVE mg/dL
Nitrite, UA: NEGATIVE
Protein Ur, POC: NEGATIVE mg/dL
Spec Grav, UA: 1.015 (ref 1.010–1.025)
Urobilinogen, UA: 0.2 E.U./dL
pH, UA: 5.5 (ref 5.0–8.0)

## 2022-02-03 MED ORDER — CIPROFLOXACIN HCL 500 MG PO TABS: 500.0000 mg | ORAL_TABLET | Freq: Two times a day (BID) | ORAL | 0 refills | Status: DC

## 2022-02-03 NOTE — ED Triage Notes (Signed)
Pt reports increased urinary frequency and burning when urinating x 2 days. Pt reports she finished cephalexin on 01/29/22.

## 2022-02-03 NOTE — ED Provider Notes (Signed)
[facility]    CSN: [id number] Arrival date & time: 02/03/22  1201      History   Chief Complaint Chief Complaint  Patient presents with   Dysuria    HPI Angela [name] is a 76 y.o. female.   Presenting today with 2-day history of urinary frequency, dysuria, urgency.  She states she was just treated for a urinary tract infection with Keflex, finished this course on 01/29/2022.  She had complete resolution of symptoms up until 2 days ago.  She denies fever, chills, abdominal pain, nausea, vomiting, flank pain.  Not trying thing over-the-counter for symptoms thus far.  Previous urine culture from 2 weeks ago showing E. coli.   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 NECK TX=MOHS   BCC (basal cell carcinoma of skin) 08/26/2018   RIGHT FRONT SHOULDER TX WITH BX   BCC (basal cell carcinoma) 08/31/1994   RIGHT SUPER CLAVICLE MEDIAL TX CX3 5FU   Breast cancer ([place]) 2009   Left Breast Cancer   CAD (coronary artery disease)    Cataract    Diabetes mellitus without complication ([place])    Essential hypertension    Hyperlipidemia    Macular retinal cyst of right eye    OSA (obstructive sleep apnea)    CPAP   Osteopenia    Osteopenia    Personal history of radiation therapy    Sleep apnea     Patient Active Problem List   Diagnosis  Date Noted   Dysuria 07/17/2021   Cystitis 07/17/2021   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   Type 2 macular telangiectasis, 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   Nuclear sclerotic cataract of left eye 03/08/2020   Numbness and tingling of both feet 11/10/2019   Neuropathy 11/10/2019   CAD (coronary artery disease) 07/24/2019   History of colon polyps 06/12/2018   History of skin cancer 02/21/2016   Cortical age-related cataract of left eye 09/14/2015   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 09/14/2015   OSA (obstructive sleep apnea) 08/24/2015   Hyperglycemia 11/12/2014   Chest discomfort 10/05/2014   Osteoporosis-due to prior treatment with Prolia/Fosamax 06/04/2014   Cataract 07/16/2012   Macular retinal cyst 07/16/2012   Pseudophakia of right eye 07/16/2012   History of breast cancer 03/20/2011   Malignant neoplasm of breast (female) ([place]) 03/20/2011   HTN (hypertension) 07/13/2008   Vitamin D deficiency 07/09/2007   Hyperlipidemia 07/09/2007    Past Surgical History:  Procedure Laterality Date   BREAST EXCISIONAL BIOPSY Right    BREAST LUMPECTOMY Left 06/2008   CATARACT EXTRACTION  2012   COLONOSCOPY     Eyelid surgery Bilateral    INTRAVASCULAR PRESSURE  WIRE/FFR STUDY N/A 06/01/2019   Procedure: INTRAVASCULAR PRESSURE WIRE/FFR STUDY;  Surgeon: Belva Crome, MD;  Location: [facility];  Service: Cardiovascular;  Laterality: N/A;   LEFT HEART CATH AND CORONARY ANGIOGRAPHY N/A 06/01/2019   Procedure: LEFT HEART CATH AND CORONARY ANGIOGRAPHY;  Surgeon: Belva Crome, MD;  Location: [facility];  Service: Cardiovascular;  Laterality: N/A;   MASS EXCISION  03/2008   Appendix   MOHS SURGERY     POLYPECTOMY     SKIN CANCER EXCISION     TONSILLECTOMY      OB History     Gravida  3   Para  2   Term  2   Preterm      AB      Living  2      SAB      IAB      Ectopic      Multiple      Live Births                Home Medications    Prior to Admission medications   Medication Sig Start Date End Date Taking? Authorizing Provider  ciprofloxacin (CIPRO) 500 MG tablet Take 1 tablet (500 mg total) by mouth 2 (two) times daily. 02/03/22  Yes Volney American, PA-C  acetaminophen (TYLENOL) 500 MG tablet acetaminophen 500 mg tablet  Take 2 tablets every 6 hours by oral route as needed.    [provider]  alendronate (FOSAMAX) 70 MG tablet TAKE 1 TABLET BY MOUTH EVERY 7 DAYS WITH A FULL GLASS OF WATER ON AN EMPTY STOMACH 12/27/21   Marin Olp, MD  aspirin 81 MG tablet Take 81 mg by mouth every evening.    [provider]  Bromfenac Sodium 0.09 % SOLN  01/04/22   [provider]  Calcium Carbonate-Vitamin D 600-400 MG-UNIT tablet Take 2 tablets by mouth every evening.     [provider]  cephALEXin (KEFLEX) 500 MG capsule Take 1 capsule (500 mg total) by mouth 2 (two) times daily. 01/26/22   Volney American, PA-C  Cholecalciferol (VITAMIN D3) 50 MCG (2000 UT) capsule Take 2,000 Units by mouth every evening.     [provider]  Coenzyme Q10 (COQ-10) 100 MG CAPS Take 100 mg by mouth daily.     [provider]  fish oil-omega-3 fatty acids 1000 MG capsule Take 1 g by mouth every evening.     [provider]  hydrochlorothiazide (HYDRODIURIL) 25 MG tablet Take 1 tablet (25 mg total) by mouth daily. 08/24/21   Marin Olp, MD  isosorbide mononitrate (IMDUR) 30 MG 24 hr tablet TAKE 1 TABLET(30 MG) BY MOUTH TWICE DAILY Patient taking differently: daily. 01/12/21   Satira Sark, MD  metFORMIN (GLUCOPHAGE) 500 MG tablet Take 1 tablet (500 mg total) by mouth 2 (two) times daily with a meal. 08/24/21   Marin Olp, MD  metoprolol succinate (TOPROL-XL) 50 MG 24 hr tablet Take with or immediately following a meal. 08/24/21   Marin Olp, MD  Multiple Vitamin (MULTIVITAMIN) tablet Take 1 tablet by mouth  every evening.    [provider]  nitroGLYCERIN (NITROSTAT) 0.4 MG SL tablet ONE TABLET UNDER TONGUE AS NEEDED FOR CHEST PAIN 10/04/21   Satira Sark, MD  potassium chloride SA (KLOR-CON) 20 MEQ tablet TAKE 1 TABLET BY MOUTH EVERY DAY 01/05/21   Satira Sark, MD  rosuvastatin (CRESTOR) 40 MG tablet  TAKE 1 TABLET(40 MG) BY MOUTH DAILY 01/12/22   Satira Sark, MD  vitamin C (ASCORBIC ACID) 500 MG tablet Take 500 mg by mouth every evening.     [provider]    Family History Family History  Problem Relation Age of Onset   Heart attack Mother    Hypertension Mother    Heart disease Father    Heart attack Father    Hypertension Father    Colon cancer Neg Hx    Stroke Neg Hx    Esophageal cancer Neg Hx    Rectal cancer Neg Hx    Stomach cancer Neg Hx    Colon polyps Neg Hx    Crohn's disease Neg Hx     Social History Social History   Tobacco Use   Smoking status: Never    Passive exposure: Never   Smokeless tobacco: Never  Vaping Use   Vaping Use: Never used  Substance Use Topics   Alcohol use: Yes    Alcohol/week: 3.0 standard drinks    Types: 3 Glasses of wine per week    Comment: one glass a week   Drug use: No     Allergies   Lisinopril and Statins   Review of Systems Review of Systems Per HPI  Physical Exam Triage Vital Signs ED Triage Vitals  Enc Vitals Group     BP 02/03/22 1326 135/80     Pulse Rate 02/03/22 1326 71     Resp 02/03/22 1326 18     Temp 02/03/22 1326 98.1 F (36.7 C)     Temp Source 02/03/22 1326 Oral     SpO2 02/03/22 1326 95 %     Weight --      Height --      Head Circumference --      Peak Flow --      Pain Score 02/03/22 1325 0     Pain Loc --      Pain Edu? --      Excl. in [place]? --    No data found.  Updated Vital Signs BP 135/80 (BP Location: Right Arm)   Pulse 71   Temp 98.1 F (36.7 C) (Oral)   Resp 18   LMP  (LMP Unknown)   SpO2 95%   Visual Acuity Right Eye Distance:   Left  Eye Distance:   Bilateral Distance:    Right Eye Near:   Left Eye Near:    Bilateral Near:     Physical Exam Vitals and nursing note reviewed.  Constitutional:      Appearance: Normal appearance. She is not ill-appearing.  HENT:     Head: Atraumatic.  Eyes:     Extraocular Movements: Extraocular movements intact.     Conjunctiva/sclera: Conjunctivae normal.  Cardiovascular:     Rate and Rhythm: Normal rate and regular rhythm.     Heart sounds: Normal heart sounds.  Pulmonary:     Effort: Pulmonary effort is normal.     Breath sounds: Normal breath sounds.  Abdominal:     General: Bowel sounds are normal. There is no distension.     Palpations: Abdomen is soft.     Tenderness: There is no abdominal tenderness. There is no right CVA tenderness, left CVA tenderness or guarding.  Musculoskeletal:        General: Normal range of motion.     Cervical back: Normal range of motion and neck supple.  Skin:    General: Skin is warm and  dry.  Neurological:     Mental Status: She is alert and oriented to person, place, and time.  Psychiatric:        Mood and Affect: Mood normal.        Thought Content: Thought content normal.        Judgment: Judgment normal.     UC Treatments / Results  Labs (all labs ordered are listed, but only abnormal results are displayed) Labs Reviewed  POCT URINALYSIS DIP (MANUAL ENTRY) - Abnormal; Notable for the following components:      Result Value   Clarity, UA hazy (*)    Blood, UA trace-intact (*)    Leukocytes, UA Small (1+) (*)    All other components within normal limits  URINE CULTURE    EKG   Radiology No results found.  Procedures Procedures (including critical care time)  Medications Ordered in UC Medications - No data to display  Initial Impression / Assessment and Plan / UC Course  I have reviewed the triage vital signs and the nursing notes.  Pertinent labs & imaging results that were available during my care of the  patient were reviewed by me and considered in my medical decision making (see chart for details).     Urinalysis today again with evidence of urinary tract infection.  Urine culture pending, treat with Cipro while awaiting these results.  Push fluids, over-the-counter supportive medications and home care reviewed.  Follow-up with PCP next week.  Final Clinical Impressions(s) / UC Diagnoses   Final diagnoses:  Acute lower UTI   Discharge Instructions   None    ED Prescriptions     Medication Sig Dispense Auth. Provider   ciprofloxacin (CIPRO) 500 MG tablet Take 1 tablet (500 mg total) by mouth 2 (two) times daily. 14 tablet Volney American, [place]      PDMP not reviewed this encounter.   Volney American, [place] 02/03/22 1355

## 2022-02-04 LAB — URINE CULTURE: Culture: <10000 — AB

## 2022-02-19 ENCOUNTER — Encounter: Payer: Self-pay | Admitting: Dermatology

## 2022-02-19 NOTE — Progress Notes (Signed)
   Follow-Up Visit   Subjective  Angela [name] is a 76 y.o. female who presents for the following: Procedure (Patient here today for treatment of BCC x 1 right upper arm - anterior and CIS x 1 right temporal scalp ).  Biopsy-proven nonmelanoma skin cancers right temple face and right upper arm Location:  Duration:  Quality:  Associated Signs/Symptoms: Modifying Factors:  Severity:  Timing: Context:   Objective  Well appearing patient in no apparent distress; mood and affect are within normal limits. Right Upper Arm - Anterior Biopsy site identified by nurse and me  Right Temple Biopsy site identified by nurse and me    A focused examination was performed including face, arm. Relevant physical exam findings are noted in the Assessment and Plan.   Assessment & Plan    Basal cell carcinoma (BCC) of skin of right upper extremity including shoulder Right Upper Arm - Anterior  Destruction of lesion Complexity: simple   Destruction method: electrodesiccation and curettage   Informed consent: discussed and consent obtained   Timeout:  patient name, date of birth, surgical site, and procedure verified Procedure prep:  Patient was prepped and draped in usual sterile fashion Prep type:  Isopropyl alcohol Anesthesia: the lesion was anesthetized in a standard fashion   Anesthetic:  1% lidocaine w/ epinephrine 1-100,000 buffered w/ 8.4% NaHCO3 Curettage cycles:  3 Lesion length (cm):  1 Lesion width (cm):  1 Margin per side (cm):  0 Final wound size (cm):  1 Hemostasis achieved with:  pressure and aluminum chloride Outcome: patient tolerated procedure well with no complications   Post-procedure details: sterile dressing applied and wound care instructions given   Dressing type: bandage and petrolatum    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  Destruction of lesion Complexity: simple   Destruction method: electrodesiccation and curettage   Informed consent:  discussed and consent obtained   Timeout:  patient name, date of birth, surgical site, and procedure verified Procedure prep:  Patient was prepped and draped in usual sterile fashion Prep type:  Isopropyl alcohol Anesthesia: the lesion was anesthetized in a standard fashion   Anesthetic:  1% lidocaine w/ epinephrine 1-100,000 buffered w/ 8.4% NaHCO3 Curettage performed in three different directions: Yes   Electrodesiccation performed over the curetted area: Yes   Curettage cycles:  3 Lesion length (cm):  1.2 Lesion width (cm):  1.2 Margin per side (cm):  0 Final wound size (cm):  1.2 Hemostasis achieved with:  pressure and aluminum chloride Outcome: patient tolerated procedure well with no complications   Post-procedure details: sterile dressing applied and wound care instructions given   Dressing type: bandage and petrolatum        I, [name], MD, have reviewed all documentation for this visit.  The documentation on 02/19/22 for the exam, diagnosis, procedures, and orders are all accurate and complete.

## 2022-02-21 ENCOUNTER — Ambulatory Visit: Payer: Medicare PPO | Admitting: Podiatry

## 2022-03-01 DIAGNOSIS — G4733 Obstructive sleep apnea (adult) (pediatric): Secondary | ICD-10-CM | POA: Diagnosis not present

## 2022-03-06 ENCOUNTER — Ambulatory Visit: Payer: Medicare PPO | Admitting: Family Medicine

## 2022-03-06 ENCOUNTER — Encounter: Payer: Self-pay | Admitting: Family Medicine

## 2022-03-06 VITALS — BP 138/72 | HR 54 | Temp 97.3°F | Ht 65.35 in | Wt 173.8 lb

## 2022-03-06 DIAGNOSIS — I10 Essential (primary) hypertension: Secondary | ICD-10-CM | POA: Diagnosis not present

## 2022-03-06 DIAGNOSIS — E785 Hyperlipidemia, unspecified: Secondary | ICD-10-CM | POA: Diagnosis not present

## 2022-03-06 DIAGNOSIS — R739 Hyperglycemia, unspecified: Secondary | ICD-10-CM

## 2022-03-06 DIAGNOSIS — I25118 Atherosclerotic heart disease of native coronary artery with other forms of angina pectoris: Secondary | ICD-10-CM

## 2022-03-06 LAB — COMPREHENSIVE METABOLIC PANEL
ALT: 20 U/L (ref 0–35)
AST: 22 U/L (ref 0–37)
Albumin: 4.2 g/dL (ref 3.5–5.2)
Alkaline Phosphatase: 47 U/L (ref 39–117)
BUN: 14 mg/dL (ref 6–23)
CO2: 30 mEq/L (ref 19–32)
Calcium: 9.4 mg/dL (ref 8.4–10.5)
Chloride: 105 mEq/L (ref 96–112)
Creatinine, Ser: 0.58 mg/dL (ref 0.40–1.20)
GFR: 88.66 mL/min (ref >60.00)
Glucose, Bld: 98 mg/dL (ref 70–99)
Potassium: 3.9 mEq/L (ref 3.5–5.1)
Sodium: 140 mEq/L (ref 135–145)
Total Bilirubin: 0.5 mg/dL (ref 0.2–1.2)
Total Protein: 6.5 g/dL (ref 6.0–8.3)

## 2022-03-06 LAB — HEMOGLOBIN A1C: Hgb A1c MFr Bld: 6.1 % (ref 4.6–6.5)

## 2022-03-06 LAB — LIPID PANEL
Cholesterol: 95 mg/dL (ref 0–200)
HDL: 30.3 mg/dL — ABNORMAL LOW (ref >39.00)
LDL Cholesterol: 44 mg/dL (ref 0–99)
NonHDL: 64.62
Total CHOL/HDL Ratio: 3
Triglycerides: 102 mg/dL (ref 0.0–149.0)
VLDL: 20.4 mg/dL (ref 0.0–40.0)

## 2022-03-06 NOTE — Progress Notes (Signed)
Phone [phone] In person visit   Subjective:   Angela [name] is a 76 y.o. year old very pleasant female patient who presents for/with See problem oriented charting Chief Complaint  Patient presents with   Follow-up    Pt wanted to mention that she did have her colonoscopy and had ER visit due to UTI.   Hyperlipidemia   Hypertension   fosomax    Wants to discuss.    Past Medical History-  Patient Active Problem List   Diagnosis Date Noted   CAD (coronary artery disease) 07/24/2019    Priority: High   History of breast cancer 03/20/2011    Priority: High   OSA (obstructive sleep apnea) 08/24/2015    Priority: Medium    Hyperglycemia 11/12/2014    Priority: Medium    Osteoporosis-due to prior treatment with Prolia/Fosamax 06/04/2014    Priority: Medium    HTN (hypertension) 07/13/2008    Priority: Medium    Hyperlipidemia 07/09/2007    Priority: Medium    History of skin cancer 02/21/2016    Priority: Low   Chest discomfort 10/05/2014    Priority: Low   Vitamin D deficiency 07/09/2007    Priority: Low   Dysuria 07/17/2021   Cystitis 07/17/2021   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   Type 2 macular telangiectasis, 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   Nuclear sclerotic cataract of left eye 03/08/2020   Numbness and tingling of both feet 11/10/2019   Neuropathy 11/10/2019   History of colon polyps 06/12/2018   Cortical age-related cataract of left eye 09/14/2015   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 09/14/2015   Cataract 07/16/2012   Macular retinal cyst 07/16/2012   Pseudophakia of right eye 07/16/2012   Malignant neoplasm of breast (female) ([facility]) 03/20/2011    Medications- reviewed and updated Current Outpatient Medications  Medication Sig Dispense Refill   acetaminophen (TYLENOL) 500 MG tablet acetaminophen 500 mg tablet  Take 2 tablets every 6 hours by oral route as needed.     aspirin 81 MG tablet Take 81 mg  by mouth every evening.     Bromfenac Sodium 0.09 % SOLN      Calcium Carbonate-Vitamin D 600-400 MG-UNIT tablet Take 2 tablets by mouth every evening.      cephALEXin (KEFLEX) 500 MG capsule Take 1 capsule (500 mg total) by mouth 2 (two) times daily. 10 capsule 0   Cholecalciferol (VITAMIN D3) 50 MCG (2000 UT) capsule Take 2,000 Units by mouth every evening.      ciprofloxacin (CIPRO) 500 MG tablet Take 1 tablet (500 mg total) by mouth 2 (two) times daily. 14 tablet 0   Coenzyme Q10 (COQ-10) 100 MG CAPS Take 100 mg by mouth daily.      fish oil-omega-3 fatty acids 1000 MG capsule Take 1 g by mouth every evening.      hydrochlorothiazide (HYDRODIURIL) 25 MG tablet Take 1 tablet (25 mg total) by mouth daily. 90 tablet 3   isosorbide mononitrate (IMDUR) 30 MG 24 hr tablet TAKE 1 TABLET(30 MG) BY MOUTH TWICE DAILY (Patient taking differently: daily.) 180 tablet 3   metFORMIN (GLUCOPHAGE) 500 MG tablet Take 1 tablet (500 mg total) by mouth 2 (two) times daily with a meal. 180 tablet 3   metoprolol succinate (TOPROL-XL) 50 MG 24 hr tablet Take with or immediately following a meal. 90 tablet 3   Multiple Vitamin (MULTIVITAMIN) tablet Take 1 tablet by mouth every evening.  nitroGLYCERIN (NITROSTAT) 0.4 MG SL tablet ONE TABLET UNDER TONGUE AS NEEDED FOR CHEST PAIN 25 tablet 6   potassium chloride SA (KLOR-CON) 20 MEQ tablet TAKE 1 TABLET BY MOUTH EVERY DAY 90 tablet 3   rosuvastatin (CRESTOR) 40 MG tablet TAKE 1 TABLET(40 MG) BY MOUTH DAILY 90 tablet 3   vitamin C (ASCORBIC ACID) 500 MG tablet Take 500 mg by mouth every evening.      No current facility-administered medications for this visit.     Objective:  BP 138/72   Pulse (!) 54   Temp (!) 97.3 F (36.3 C)   Ht 5' 5.35" (1.66 m)   Wt 173 lb 12.8 oz (78.8 kg)   LMP  (LMP Unknown)   SpO2 96%   BMI 28.61 kg/m  Gen: NAD, resting comfortably CV: RRR no murmurs rubs or gallops Lungs: CTAB no crackles, wheeze, rhonchi Ext: no edema Skin:  warm, dry    Assessment and Plan   #History of breast cancer-status post lumpectomy and radiation therapy in 2009.  Completed Arimidex December 2014 after initially starting on tamoxifen.  Continues yearly mammograms- done in December 2022 and plans for this december   #Coronary artery disease of native artery of native heart with stable angina pectoris -follows with DR. McDowell  #hyperlipidemia S: Medication: aspirin 81 mg, imdur 30mg , nitroglycerin prn, rosuvastatin 40 mg - no shortness of breath, occasional mild ache left upper chest few minutes (does not have to take nitroglycerin) Lab Results  Component Value Date   CHOL 118 01/31/2021   HDL 38.40 (L) 01/31/2021   LDLCALC 58 01/31/2021   LDLDIRECT 68.0 08/24/2021   TRIG 109.0 01/31/2021   CHOLHDL 3 01/31/2021   A/P: CAD largely asymptomatic- mild twinges of pain but stable- continue current medicines and has cardiology follow up   For lipids- update lipid panel- likely continue current meds- try to forward to DR. Mcdowell  #hypertension S: medication:  hctz 25mg  (also on potassium), imdur 30mg , metoprolol 50mg  XR - has not taken hctz yet today or last few days due to travel Home readings #s: low 130s/low 70s  BP Readings from Last 3 Encounters:  03/06/22 138/72  02/03/22 135/80  01/26/22 (!) 173/82    A/P: good control at home and reasonable today- will be starting back hctz so hopefully will go lower  # Hyperglycemia/insulin resistance/prediabetes- peak a1c of 6.0 July 2019 S:  Medication: metformin 500mg  twice daily Exercise and diet- walking regularly 3 days a week for at least a mile  Lab Results  Component Value Date   HGBA1C 6.1 08/24/2021   HGBA1C 6.2 01/31/2021   HGBA1C 5.9 (H) 08/01/2020  A/P: hopefully stable- update a1c today. Continue current meds for now  #Vitamin D deficiency/osteoporosis- on prolia x 7 years then finished 1 year of fosamax- has a few pills left S: Medication:  2000 units daily  and  calcium -I reached out to Dr. Ellin Saba about alendronate for 1 year after finishing prolia and physican was on board- may 2023 planned Last vitamin D Lab Results  Component Value Date   VD25OH 33.91 09/13/2021  A/P: vitamin D has looked excellent For osteoporosis-had bone density in December 2022 we are planning a repeat in 2024 or early 2025-she can come off Fosamax at this point has finished 7 years of Prolia in 1 year Fosamax-if had worsening of bone density we can always reconsider our plan  #History of precancerous colon polyp-noted May 2023 by Dr. Claris Che polyp(s) removed were precancerous.  Normally 5 year colonoscopy follow up is recommended.  In general, we stop routine screening  colonoscopy around 80.  Accordingly, we will not contact you to schedule a follow up exam. -congratulated her on good report and discussed reasoning for discontinuation but if has blood in stool could certainly refer back  - taking small fiber supplement every other day  # recovered from UTI in may  Recommended follow up: Return in about 6 months (around 09/05/2022) for physical or sooner if needed.Schedule b4 you leave. Future Appointments  Date Time Provider Department Center  03/28/2022 11:40 AM Jonelle Sidle, MD [facility]  05/15/2022 10:45 AM Rankin, Alford Highland, MD [facility] None  09/19/2022  9:00 AM [facility]  09/19/2022  9:50 AM [facility] None  09/27/2022 11:45 AM Doreatha Massed, MD [facility] None  10/09/2022 10:15 AM [facility]  11/05/2022  8:45 AM Janalyn Harder, MD [facility]   Lab/Order associations:   ICD-10-CM   1. Coronary artery disease of native artery of native heart with stable angina pectoris (HCC)  I25.118     2. Primary hypertension  I10 Comprehensive metabolic panel    3. Hyperglycemia  R73.9 Hemoglobin A1c    4. Hyperlipidemia, unspecified hyperlipidemia type  E78.5 Lipid panel      No orders of the  defined types were placed in this encounter.   Return precautions advised.  Tana Conch, MD

## 2022-03-16 DIAGNOSIS — G4733 Obstructive sleep apnea (adult) (pediatric): Secondary | ICD-10-CM | POA: Diagnosis not present

## 2022-03-22 ENCOUNTER — Encounter: Payer: Self-pay | Admitting: Family

## 2022-03-22 ENCOUNTER — Ambulatory Visit: Payer: Medicare PPO | Admitting: Family

## 2022-03-22 VITALS — BP 142/80 | HR 65 | Temp 97.5°F | Ht 65.0 in | Wt 171.5 lb

## 2022-03-22 DIAGNOSIS — J069 Acute upper respiratory infection, unspecified: Secondary | ICD-10-CM | POA: Diagnosis not present

## 2022-03-22 MED ORDER — METHYLPREDNISOLONE ACETATE 80 MG/ML IJ SUSP
60.0000 mg | Freq: Once | INTRAMUSCULAR | Status: AC
  Administered 2022-03-22: 60 mg via INTRAMUSCULAR

## 2022-03-22 NOTE — Progress Notes (Signed)
Patient ID: Angela [name], female    DOB: 08-07-1946, 76 y.o.   MRN: [id number]  Chief Complaint  Patient presents with   Cough    Pt c/o cough with clear/yellow mucus, left ear fullness and headaches for about 2 weeks. Took 4 at home Covid test over the past days which was negative. Pt has tried delsym which only helps her to sleep through the night without coughing.     HPI: Upper Respiratory Infection: Symptoms include left ear pressure/pain, congestion, headache described as frontal, and non productive cough.  Onset of symptoms was 2 weeks ago, unchanged since that time. She is drinking moderate amounts of fluids. Evaluation to date: none.  Treatment to date: antihistamines and cough suppressants.   Assessment & Plan:  1. Viral upper respiratory tract infection started with sinus sx, sore throat 2 weeks ago, but now having left ear pain and cough is persisting. Ears have mild injection on edge of TM, but no effusion, bulging or erythema, lungs clear. Giving steroid injection, advised on SE, to help suppress sx, advised to continue Coricidin x 5 days, drink 2L of water qd, call back next week if still not better.  - methylPREDNISolone acetate (DEPO-MEDROL) injection 60 mg    Subjective:    Outpatient Medications Prior to Visit  Medication Sig Dispense Refill   acetaminophen (TYLENOL) 500 MG tablet acetaminophen 500 mg tablet  Take 2 tablets every 6 hours by oral route as needed.     aspirin 81 MG tablet Take 81 mg by mouth every evening.     Bromfenac Sodium 0.09 % SOLN      Calcium Carbonate-Vitamin D 600-400 MG-UNIT tablet Take 2 tablets by mouth every evening.      cephALEXin (KEFLEX) 500 MG capsule Take 1 capsule (500 mg total) by mouth 2 (two) times daily. 10 capsule 0   Cholecalciferol (VITAMIN D3) 50 MCG (2000 UT) capsule Take 2,000 Units by mouth every evening.      ciprofloxacin (CIPRO) 500 MG tablet Take 1 tablet (500 mg total) by mouth 2 (two) times daily. 14 tablet 0    Coenzyme Q10 (COQ-10) 100 MG CAPS Take 100 mg by mouth daily.      fish oil-omega-3 fatty acids 1000 MG capsule Take 1 g by mouth every evening.      hydrochlorothiazide (HYDRODIURIL) 25 MG tablet Take 1 tablet (25 mg total) by mouth daily. 90 tablet 3   isosorbide mononitrate (IMDUR) 30 MG 24 hr tablet TAKE 1 TABLET(30 MG) BY MOUTH TWICE DAILY (Patient taking differently: daily.) 180 tablet 3   metFORMIN (GLUCOPHAGE) 500 MG tablet Take 1 tablet (500 mg total) by mouth 2 (two) times daily with a meal. 180 tablet 3   metoprolol succinate (TOPROL-XL) 50 MG 24 hr tablet Take with or immediately following a meal. 90 tablet 3   Multiple Vitamin (MULTIVITAMIN) tablet Take 1 tablet by mouth every evening.     nitroGLYCERIN (NITROSTAT) 0.4 MG SL tablet ONE TABLET UNDER TONGUE AS NEEDED FOR CHEST PAIN 25 tablet 6   potassium chloride SA (KLOR-CON) 20 MEQ tablet TAKE 1 TABLET BY MOUTH EVERY DAY 90 tablet 3   rosuvastatin (CRESTOR) 40 MG tablet TAKE 1 TABLET(40 MG) BY MOUTH DAILY 90 tablet 3   vitamin C (ASCORBIC ACID) 500 MG tablet Take 500 mg by mouth every evening.      No facility-administered medications prior to visit.   Past Medical History:  Diagnosis Date   Allergy    seasonal  Arthritis    BCC (basal cell carcinoma of skin) 08/31/1994   RIGHT CLAVICLE INFERIOR   BCC (basal cell carcinoma of skin) 02/21/1994   RIGHT SCAPULA TX CX3 5FU   BCC (basal cell carcinoma of skin) 02/21/1994   RIGHT CLAVICAL TX CX3 5FU   BCC (basal cell carcinoma of skin) 05/25/1994   RIGHT SCAPULA TX =MOHS   BCC (basal cell carcinoma of skin) 02/02/1999   RIGHT FRONT SHOULDER TX CURET X3 ,EXC   BCC (basal cell carcinoma of skin) 12/25/1999   RIGHT UPPER BACK CX3 5FU   BCC (basal cell carcinoma of skin) 03/29/2005   RIGHT LATER LOWER LEG TX MOHS   BCC (basal cell carcinoma of skin) 11/10/2005   LEFT ANT. NECK TX=MOHS   BCC (basal cell carcinoma of skin) 08/26/2018   RIGHT FRONT SHOULDER TX WITH BX   BCC  (basal cell carcinoma) 08/31/1994   RIGHT SUPER CLAVICLE MEDIAL TX CX3 5FU   Breast cancer ([place]) 2009   Left Breast Cancer   CAD (coronary artery disease)    Cataract    Diabetes mellitus without complication ([place])    Essential hypertension    Hyperlipidemia    Macular retinal cyst of right eye    OSA (obstructive sleep apnea)    CPAP   Osteopenia    Osteopenia    Personal history of radiation therapy    Sleep apnea    Past Surgical History:  Procedure Laterality Date   BREAST EXCISIONAL BIOPSY Right    BREAST LUMPECTOMY Left 06/2008   CATARACT EXTRACTION  2012   COLONOSCOPY     Eyelid surgery Bilateral    INTRAVASCULAR PRESSURE WIRE/FFR STUDY N/A 06/01/2019   Procedure: INTRAVASCULAR PRESSURE WIRE/FFR STUDY;  Surgeon: Belva Crome, MD;  Location: [facility];  Service: Cardiovascular;  Laterality: N/A;   LEFT HEART CATH AND CORONARY ANGIOGRAPHY N/A 06/01/2019   Procedure: LEFT HEART CATH AND CORONARY ANGIOGRAPHY;  Surgeon: Belva Crome, MD;  Location: [facility];  Service: Cardiovascular;  Laterality: N/A;   MASS EXCISION  03/2008   Appendix   MOHS SURGERY     POLYPECTOMY     SKIN CANCER EXCISION     TONSILLECTOMY     Allergies  Allergen Reactions   Lisinopril Cough   Statins Other (See Comments)    Myalgia with lipitor- unproven She is able to tolerate simvastatin      Objective:    Physical Exam Vitals and nursing note reviewed.  Constitutional:      Appearance: Normal appearance.  HENT:     Right Ear: Ear canal normal.     Left Ear: Tympanic membrane is injected (just on malleos edge).     Nose:     Right Sinus: No maxillary sinus tenderness or frontal sinus tenderness.     Left Sinus: No maxillary sinus tenderness or frontal sinus tenderness.     Mouth/Throat:     Mouth: Mucous membranes are moist.     Pharynx: No pharyngeal swelling, oropharyngeal exudate, posterior oropharyngeal erythema or uvula swelling.  Cardiovascular:     Rate  and Rhythm: Normal rate and regular rhythm.  Pulmonary:     Effort: Pulmonary effort is normal.     Breath sounds: Normal breath sounds.  Musculoskeletal:        General: Normal range of motion.  Lymphadenopathy:     Cervical: No cervical adenopathy.  Skin:    General: Skin is warm and dry.  Neurological:  Mental Status: She is alert.  Psychiatric:        Mood and Affect: Mood normal.        Behavior: Behavior normal.    BP (!) 142/80 (BP Location: Left Arm, Patient Position: Sitting, Cuff Size: Large)   Pulse 65   Temp (!) 97.5 F (36.4 C) (Temporal)   Ht '5\' 5"'$  (1.651 m)   Wt 171 lb 8 oz (77.8 kg)   LMP  (LMP Unknown)   SpO2 95%   BMI 28.54 kg/m  Wt Readings from Last 3 Encounters:  03/22/22 171 lb 8 oz (77.8 kg)  03/06/22 173 lb 12.8 oz (78.8 kg)  01/09/22 167 lb (75.8 kg)       Jeanie Sewer, NP

## 2022-03-22 NOTE — Patient Instructions (Addendum)
It was very nice to see you today!   We gave you a steroid injection today to help you get over this current infection. Continue to take the Coricidin you have at home for up to 5 more days. You can take extra strength Tylenol for your headache 3 times per day.  Let us know if you are not getting better by Monday!    PLEASE NOTE:  If you had any lab tests please let us know if you have not heard back within a few days. You may see your results on MyChart before we have a chance to review them but we will give you a call once they are reviewed by [place]. If we ordered any referrals today, please let us know if you have not heard from their office within the next week.

## 2022-03-27 NOTE — Progress Notes (Unsigned)
Cardiology Office Note  Date: 03/28/2022   ID: Angela [name], DOB 06/04/1946, MRN [id number]  PCP:  Marin Olp, MD  Cardiologist:  Rozann Lesches, MD Electrophysiologist:  None   Chief Complaint  Patient presents with   Cardiac follow-up    History of Present Illness: Angela [name] is a 76 y.o. female last seen in November 2022.  She is here today with her husband for a follow-up visit.  Overall no major change from a cardiac perspective.  She does not describe any definitive angina symptoms or increasing shortness of breath with typical activities.  She and her husband are still walking for exercise about 4 days a week.  No recent travel.  Recent LDL was 44 on Crestor.  She has done well without medication intolerance.  Otherwise on aspirin, Imdur, Toprol-XL, and has as needed nitroglycerin available.  I personally reviewed her ECG today which shows sinus bradycardia.  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 NECK TX=MOHS   BCC (basal cell carcinoma of skin) 08/26/2018   RIGHT FRONT SHOULDER TX WITH BX   BCC (basal cell carcinoma) 08/31/1994   RIGHT SUPER CLAVICLE MEDIAL TX CX3 5FU   Breast cancer ([place]) 2009   Left Breast Cancer   CAD (coronary artery disease)    Cataract    Diabetes mellitus without complication ([place])    Essential  hypertension    Hyperlipidemia    Macular retinal cyst of right eye    OSA (obstructive sleep apnea)    CPAP   Osteopenia    Osteopenia    Personal history of radiation therapy    Sleep apnea     Past Surgical History:  Procedure Laterality Date   BREAST EXCISIONAL BIOPSY Right    BREAST LUMPECTOMY Left 06/2008   CATARACT EXTRACTION  2012   COLONOSCOPY     Eyelid surgery Bilateral    INTRAVASCULAR PRESSURE WIRE/FFR STUDY N/A 06/01/2019   Procedure: INTRAVASCULAR PRESSURE WIRE/FFR STUDY;  Surgeon: Belva Crome, MD;  Location: [facility];  Service: Cardiovascular;  Laterality: N/A;   LEFT HEART CATH AND CORONARY ANGIOGRAPHY N/A 06/01/2019   Procedure: LEFT HEART CATH AND CORONARY ANGIOGRAPHY;  Surgeon: Belva Crome, MD;  Location: [facility];  Service: Cardiovascular;  Laterality: N/A;   MASS EXCISION  03/2008   Appendix   MOHS SURGERY     POLYPECTOMY     SKIN CANCER EXCISION     TONSILLECTOMY      Current Outpatient Medications  Medication Sig Dispense Refill   acetaminophen (TYLENOL) 500 MG tablet acetaminophen 500 mg tablet  Take 2 tablets every 6 hours by oral route as needed.     aspirin  81 MG tablet Take 81 mg by mouth every evening.     Bromfenac Sodium 0.09 % SOLN      Calcium Carbonate-Vitamin D 600-400 MG-UNIT tablet Take 2 tablets by mouth every evening.      Cholecalciferol (VITAMIN D3) 50 MCG (2000 UT) capsule Take 2,000 Units by mouth every evening.      Coenzyme Q10 (COQ-10) 100 MG CAPS Take 100 mg by mouth daily.      fish oil-omega-3 fatty acids 1000 MG capsule Take 1 g by mouth every evening.      hydrochlorothiazide (HYDRODIURIL) 25 MG tablet Take 1 tablet (25 mg total) by mouth daily. 90 tablet 3   isosorbide mononitrate (IMDUR) 30 MG 24 hr tablet TAKE 1 TABLET(30 MG) BY MOUTH TWICE DAILY (Patient taking differently: daily.) 180 tablet 3   metFORMIN (GLUCOPHAGE) 500 MG tablet Take 1 tablet (500 mg total) by mouth 2 (two) times daily with  a meal. 180 tablet 3   metoprolol succinate (TOPROL-XL) 50 MG 24 hr tablet Take with or immediately following a meal. 90 tablet 3   Multiple Vitamin (MULTIVITAMIN) tablet Take 1 tablet by mouth every evening.     nitroGLYCERIN (NITROSTAT) 0.4 MG SL tablet ONE TABLET UNDER TONGUE AS NEEDED FOR CHEST PAIN 25 tablet 6   potassium chloride SA (KLOR-CON) 20 MEQ tablet TAKE 1 TABLET BY MOUTH EVERY DAY 90 tablet 3   rosuvastatin (CRESTOR) 40 MG tablet TAKE 1 TABLET(40 MG) BY MOUTH DAILY 90 tablet 3   vitamin C (ASCORBIC ACID) 500 MG tablet Take 500 mg by mouth every evening.      No current facility-administered medications for this visit.   Allergies:  Lisinopril and Statins    ROS: No palpitations or syncope.  Physical Exam: VS:  BP 130/80 (BP Location: Right Arm, Patient Position: Sitting, Cuff Size: Normal)   Pulse (!) 59   Ht '5\' 6"'$  (1.676 m)   Wt 169 lb (76.7 kg)   LMP  (LMP Unknown)   SpO2 96%   BMI 27.28 kg/m , BMI Body mass index is 27.28 kg/m.  Wt Readings from Last 3 Encounters:  03/28/22 169 lb (76.7 kg)  03/22/22 171 lb 8 oz (77.8 kg)  03/06/22 173 lb 12.8 oz (78.8 kg)    General: Patient appears comfortable at rest. HEENT: Conjunctiva and lids normal, oropharynx clear. Neck: Supple, no elevated JVP or carotid bruits, no thyromegaly. Lungs: Clear to auscultation, nonlabored breathing at rest. Cardiac: Regular rate and rhythm, no S3 or significant systolic murmur. Extremities: No pitting edema.  ECG:  An ECG dated 12/13/2020 was personally reviewed today and demonstrated:  Sinus rhythm.  Recent Labwork: 09/13/2021: Hemoglobin 14.2; Platelets 231 03/06/2022: ALT 20; AST 22; BUN 14; Creatinine, Ser 0.58; Potassium 3.9; Sodium 140     Component Value Date/Time   CHOL 95 03/06/2022 1113   CHOL 169 01/14/2017 0835   TRIG 102.0 03/06/2022 1113   HDL 30.30 (L) 03/06/2022 1113   HDL 34 (L) 01/14/2017 0835   CHOLHDL 3 03/06/2022 1113   VLDL 20.4 03/06/2022 1113   LDLCALC 44  03/06/2022 1113   LDLCALC 90 01/14/2017 0835   LDLDIRECT 68.0 08/24/2021 1419    Other Studies Reviewed Today:  Cardiac catheterization 06/01/2019: Right dominant coronary anatomy. Moderate to heavy LAD and circumflex calcification. Widely patent left main. The proximal to mid LAD is heavily calcified and contains segmental 50% tubular narrowing over approximately 20 mm.  The mid LAD contains relatively focal calcified 65 to 75%  stenosis.  The LAD beyond this segment is widely patent.  The first diagonal contains 60% ostial narrowing and arises from the calcified tubular stenosis. Both DFR (0.9) and FFR (0.82) Circumflex coronary artery contains no significant obstruction.  The second obtuse marginal contains 50 to 60% ostial narrowing. RCA is dominant with proximal 30 to 40% narrowing and mid 25% narrowing. Normal left ventricular size and function.  Assessment and Plan:  1.  CAD, moderate by cardiac catheterization in September 2020 and plan to continue medical therapy in the absence of worsening angina.  ECG reviewed and stable.  Continue aspirin, Toprol-XL, Imdur, and Crestor.  2.  Mixed hyperlipidemia, doing well on Crestor with recent LDL 44.  Medication Adjustments/Labs and Tests Ordered: Current medicines are reviewed at length with the patient today.  Concerns regarding medicines are outlined above.   Tests Ordered: Orders Placed This Encounter  Procedures   EKG 12-Lead    Medication Changes: No orders of the defined types were placed in this encounter.   Disposition:  Follow up  6 months.  Signed, Satira Sark, MD, [facility] 03/28/2022 12:06 PM    [facility], [place], [place] [address] Phone: [phone]; Fax: [phone]

## 2022-03-28 ENCOUNTER — Ambulatory Visit: Payer: Medicare PPO | Admitting: Cardiology

## 2022-03-28 ENCOUNTER — Encounter: Payer: Self-pay | Admitting: Cardiology

## 2022-03-28 VITALS — BP 130/80 | HR 59 | Ht 66.0 in | Wt 169.0 lb

## 2022-03-28 DIAGNOSIS — I25119 Atherosclerotic heart disease of native coronary artery with unspecified angina pectoris: Secondary | ICD-10-CM

## 2022-03-28 DIAGNOSIS — E782 Mixed hyperlipidemia: Secondary | ICD-10-CM | POA: Diagnosis not present

## 2022-03-28 NOTE — Patient Instructions (Addendum)

## 2022-03-31 DIAGNOSIS — G4733 Obstructive sleep apnea (adult) (pediatric): Secondary | ICD-10-CM | POA: Diagnosis not present

## 2022-04-09 ENCOUNTER — Encounter: Payer: Self-pay | Admitting: Dermatology

## 2022-04-11 ENCOUNTER — Other Ambulatory Visit: Payer: Self-pay | Admitting: Cardiology

## 2022-04-26 ENCOUNTER — Encounter: Payer: Self-pay | Admitting: Family Medicine

## 2022-05-01 DIAGNOSIS — G4733 Obstructive sleep apnea (adult) (pediatric): Secondary | ICD-10-CM | POA: Diagnosis not present

## 2022-05-15 ENCOUNTER — Ambulatory Visit (INDEPENDENT_AMBULATORY_CARE_PROVIDER_SITE_OTHER): Payer: Medicare PPO | Admitting: Ophthalmology

## 2022-05-15 ENCOUNTER — Encounter (INDEPENDENT_AMBULATORY_CARE_PROVIDER_SITE_OTHER): Payer: Self-pay | Admitting: Ophthalmology

## 2022-05-15 DIAGNOSIS — Z961 Presence of intraocular lens: Secondary | ICD-10-CM | POA: Diagnosis not present

## 2022-05-15 DIAGNOSIS — H26492 Other secondary cataract, left eye: Secondary | ICD-10-CM | POA: Diagnosis not present

## 2022-05-15 DIAGNOSIS — G4733 Obstructive sleep apnea (adult) (pediatric): Secondary | ICD-10-CM | POA: Diagnosis not present

## 2022-05-15 DIAGNOSIS — H35073 Retinal telangiectasis, bilateral: Secondary | ICD-10-CM

## 2022-05-15 NOTE — Patient Instructions (Signed)
Patient to notify promptly if new onset visual acuity decline or distortion in either eye

## 2022-05-15 NOTE — Progress Notes (Signed)
05/15/2022     CHIEF COMPLAINT Patient presents for  Chief Complaint  Patient presents with   Retina Evaluation      HISTORY OF PRESENT ILLNESS: Angela [name] is a 76 y.o. female who presents to the clinic today for:   HPI     Retina Evaluation           Laterality: both eyes   Response to treatment: mild improvement       Last edited by Hurman Horn, MD on 05/15/2022 11:11 AM.      Referring physician: Marygrace Drought, MD [place],  [place] [id number]  HISTORICAL INFORMATION:   Selected notes from the MEDICAL RECORD NUMBER    Lab Results  Component Value Date   HGBA1C 6.1 03/06/2022     CURRENT MEDICATIONS: Current Outpatient Medications (Ophthalmic Drugs)  Medication Sig   Bromfenac Sodium 0.09 % SOLN    No current facility-administered medications for this visit. (Ophthalmic Drugs)   Current Outpatient Medications (Other)  Medication Sig   acetaminophen (TYLENOL) 500 MG tablet acetaminophen 500 mg tablet  Take 2 tablets every 6 hours by oral route as needed.   aspirin 81 MG tablet Take 81 mg by mouth every evening.   Calcium Carbonate-Vitamin D 600-400 MG-UNIT tablet Take 2 tablets by mouth every evening.    Cholecalciferol (VITAMIN D3) 50 MCG (2000 UT) capsule Take 2,000 Units by mouth every evening.    Coenzyme Q10 (COQ-10) 100 MG CAPS Take 100 mg by mouth daily.    fish oil-omega-3 fatty acids 1000 MG capsule Take 1 g by mouth every evening.    hydrochlorothiazide (HYDRODIURIL) 25 MG tablet Take 1 tablet (25 mg total) by mouth daily.   isosorbide mononitrate (IMDUR) 30 MG 24 hr tablet TAKE 1 TABLET(30 MG) BY MOUTH TWICE DAILY (Patient taking differently: daily.)   metFORMIN (GLUCOPHAGE) 500 MG tablet Take 1 tablet (500 mg total) by mouth 2 (two) times daily with a meal.   metoprolol succinate (TOPROL-XL) 50 MG 24 hr tablet Take with or immediately following a meal.   Multiple Vitamin (MULTIVITAMIN) tablet Take 1 tablet by mouth every  evening.   nitroGLYCERIN (NITROSTAT) 0.4 MG SL tablet ONE TABLET UNDER TONGUE AS NEEDED FOR CHEST PAIN   potassium chloride SA (KLOR-CON M) 20 MEQ tablet TAKE 1 TABLET BY MOUTH EVERY DAY   rosuvastatin (CRESTOR) 40 MG tablet TAKE 1 TABLET(40 MG) BY MOUTH DAILY   vitamin C (ASCORBIC ACID) 500 MG tablet Take 500 mg by mouth every evening.    No current facility-administered medications for this visit. (Other)      REVIEW OF SYSTEMS: ROS   Negative for: Constitutional, Gastrointestinal, Neurological, Skin, Genitourinary, Musculoskeletal, HENT, Endocrine, Cardiovascular, Eyes, Respiratory, Psychiatric, Allergic/Imm, Heme/Lymph Last edited by Hurman Horn, MD on 05/15/2022 11:11 AM.       ALLERGIES Allergies  Allergen Reactions   Lisinopril Cough   Statins Other (See Comments)    Myalgia with lipitor- unproven She is able to tolerate simvastatin    PAST MEDICAL HISTORY Past Medical History:  Diagnosis Date   Allergy    seasonal   Arthritis    BCC (basal cell carcinoma of skin) 08/31/1994   RIGHT CLAVICLE INFERIOR   BCC (basal cell carcinoma of skin) 02/21/1994   RIGHT SCAPULA TX CX3 5FU   BCC (basal cell carcinoma of skin) 02/21/1994   RIGHT CLAVICAL TX CX3 5FU   BCC (basal cell carcinoma of skin) 05/25/1994   RIGHT  SCAPULA TX =MOHS   BCC (basal cell carcinoma of skin) 02/02/1999   RIGHT FRONT SHOULDER TX CURET X3 ,EXC   BCC (basal cell carcinoma of skin) 12/25/1999   RIGHT UPPER BACK CX3 5FU   BCC (basal cell carcinoma of skin) 03/29/2005   RIGHT LATER LOWER LEG TX MOHS   BCC (basal cell carcinoma of skin) 11/10/2005   LEFT ANT. NECK TX=MOHS   BCC (basal cell carcinoma of skin) 08/26/2018   RIGHT FRONT SHOULDER TX WITH BX   BCC (basal cell carcinoma) 08/31/1994   RIGHT SUPER CLAVICLE MEDIAL TX CX3 5FU   Breast cancer ([place]) 2009   Left Breast Cancer   CAD (coronary artery disease)    Cataract    Diabetes mellitus without complication ([place])    Essential  hypertension    Hyperlipidemia    Macular retinal cyst of right eye    Nuclear sclerotic cataract of left eye 03/08/2020   The nature of cataract was discussed with the patient as well as the elective nature of surgery. The patient was reassured that surgery at a later date does not put the patient at risk for a worse outcome. It was emphasized that the need for surgery is dictated by the patient's quality of life as influenced by the cataract. Patient was instructed to maintain close follow up with their general eye    OSA (obstructive sleep apnea)    CPAP   Osteopenia    Osteopenia    Personal history of radiation therapy    Sleep apnea    Past Surgical History:  Procedure Laterality Date   BREAST EXCISIONAL BIOPSY Right    BREAST LUMPECTOMY Left 06/2008   CATARACT EXTRACTION  2012   COLONOSCOPY     Eyelid surgery Bilateral    INTRAVASCULAR PRESSURE WIRE/FFR STUDY N/A 06/01/2019   Procedure: INTRAVASCULAR PRESSURE WIRE/FFR STUDY;  Surgeon: Belva Crome, MD;  Location: [facility];  Service: Cardiovascular;  Laterality: N/A;   LEFT HEART CATH AND CORONARY ANGIOGRAPHY N/A 06/01/2019   Procedure: LEFT HEART CATH AND CORONARY ANGIOGRAPHY;  Surgeon: Belva Crome, MD;  Location: [facility];  Service: Cardiovascular;  Laterality: N/A;   MASS EXCISION  03/2008   Appendix   MOHS SURGERY     POLYPECTOMY     SKIN CANCER EXCISION     TONSILLECTOMY      FAMILY HISTORY Family History  Problem Relation Age of Onset   Heart attack Mother    Hypertension Mother    Heart disease Father    Heart attack Father    Hypertension Father    Colon cancer Neg Hx    Stroke Neg Hx    Esophageal cancer Neg Hx    Rectal cancer Neg Hx    Stomach cancer Neg Hx    Colon polyps Neg Hx    Crohn's disease Neg Hx     SOCIAL HISTORY Social History   Tobacco Use   Smoking status: Never    Passive exposure: Never   Smokeless tobacco: Never  Vaping Use   Vaping Use: Never used   Substance Use Topics   Alcohol use: Yes    Alcohol/week: 3.0 standard drinks of alcohol    Types: 3 Glasses of wine per week    Comment: one glass a week   Drug use: No         OPHTHALMIC EXAM:  Base Eye Exam     Visual Acuity (ETDRS)  Right Left   Dist cc 20/40 -1 20/30 +1   Dist ph cc NI NI         Tonometry (Tonopen, 11:14 AM)       Right Left   Pressure 14 14         Pupils       Pupils APD   Right PERRL None   Left PERRL None         Visual Fields       Left Right    Full Full         Extraocular Movement       Right Left    Full, Ortho Full, Ortho         Neuro/Psych     Oriented x3: Yes   Mood/Affect: Normal         Dilation     Both eyes: 1.0% Mydriacyl, 2.5% Phenylephrine @ 11:14 AM           Slit Lamp and Fundus Exam     External Exam       Right Left   External Normal Normal         Slit Lamp Exam       Right Left   Lids/Lashes Normal Normal   Conjunctiva/Sclera White and quiet White and quiet   Cornea Clear Clear   Anterior Chamber Deep and quiet Deep and quiet   Iris Round and reactive Round and reactive   Lens Centered posterior chamber intraocular lens Centered posterior chamber intraocular lens, 1+ Posterior capsular opacification   Anterior Vitreous Normal Normal         Fundus Exam       Right Left   Posterior Vitreous Asteroid hyalosis, no sign of PVD Normal, no sign of PVD   Disc Normal Normal   C/D Ratio 0.25 0.25   Macula Pseudocystoid change in the fovea, no visible microaneurysms, no visible right ankle venules, Hard drusen Temporal aspect of the fovea with a small old microaneurysms, no exudate, no crystals retina, Hard drusen   Vessels Normal Normal   Periphery Inferotemporal peripheral retina schisis no inner and outer holes Normal            IMAGING AND PROCEDURES  Imaging and Procedures for 05/15/22  OCT, Retina - OU - Both Eyes       Right Eye Quality was good.  Scan locations included subfoveal. Central Foveal Thickness: 251. Progression has been stable. Findings include abnormal foveal contour, cystoid macular edema.   Left Eye Quality was good. Scan locations included subfoveal. Central Foveal Thickness: 242. Progression has been stable. Findings include abnormal foveal contour, cystoid macular edema.   Notes Technically intraretinal Cystoid change which is not pseudophakic CME but rather cavitary lesions of prior MAC-TEL when it was active, OS now with outer gap in the photoreceptor layer that this could be tractional related Upon the intraretinal cystoid pseudocyst or change from prior MAC-TEL.  No change over the last 6 months, no sign of ERM  OD, stable over time as well.             ASSESSMENT/PLAN:  OSA (obstructive sleep apnea) Patient continues on CPAP   Type 2 macular telangiectasis, bilateral Bilateral OU stable over time.  Good acuity.  Maintain in my opinion on the use of continued CPAP use to prevent nightly hypoxic stress to the macular regions  Left posterior capsular opacification Early PC opacification is present not significant but nearing visual axis OS  ICD-10-CM   1. Type 2 macular telangiectasis, bilateral  H35.073 OCT, Retina - OU - Both Eyes    2. OSA (obstructive sleep apnea)  G47.33     3. Pseudophakia of left eye  Z96.1     4. Left posterior capsular opacification  H26.492       1.  OU with type II MAC-TEL no progression over time.  Continues on OSA therapy with CPAP.  This prevents nightly hypoxic stress and damage  2 retinal gap the left eye is from the cavitary portion of the MAC-TEL.  No specific therapy is warranted for this condition.  Good acuity is maintained therefore observe.  3.  Ophthalmic Meds Ordered this visit:  No orders of the defined types were placed in this encounter.      Return in about 6 months (around 11/13/2022) for DILATE OU, OCT.  Patient Instructions  Patient to  notify promptly if new onset visual acuity decline or distortion in either eye   Explained the diagnoses, plan, and follow up with the patient and they expressed understanding.  Patient expressed understanding of the importance of proper follow up care.   Clent Demark Kassadi Presswood M.D. Diseases & Surgery of the Retina and Vitreous [facility] 05/15/22     Abbreviations: M myopia (nearsighted); A astigmatism; H hyperopia (farsighted); P presbyopia; Mrx spectacle prescription;  CTL contact lenses; OD right eye; OS left eye; OU both eyes  XT exotropia; ET esotropia; PEK punctate epithelial keratitis; PEE punctate epithelial erosions; DES dry eye syndrome; MGD meibomian gland dysfunction; ATs artificial tears; PFAT's preservative free artificial tears; [place] nuclear sclerotic cataract; PSC posterior subcapsular cataract; ERM epi-retinal membrane; PVD posterior vitreous detachment; RD retinal detachment; DM diabetes mellitus; DR diabetic retinopathy; NPDR non-proliferative diabetic retinopathy; PDR proliferative diabetic retinopathy; CSME clinically significant macular edema; DME diabetic macular edema; dbh dot blot hemorrhages; CWS cotton wool spot; POAG primary open angle glaucoma; C/D cup-to-disc ratio; HVF humphrey visual field; GVF goldmann visual field; OCT optical coherence tomography; IOP intraocular pressure; BRVO Branch retinal vein occlusion; CRVO central retinal vein occlusion; CRAO central retinal artery occlusion; BRAO branch retinal artery occlusion; RT retinal tear; SB scleral buckle; PPV pars plana vitrectomy; VH Vitreous hemorrhage; PRP panretinal laser photocoagulation; IVK intravitreal kenalog; VMT vitreomacular traction; MH Macular hole;  NVD neovascularization of the disc; NVE neovascularization elsewhere; AREDS age related eye disease study; ARMD age related macular degeneration; POAG primary open angle glaucoma; EBMD epithelial/anterior basement membrane dystrophy; ACIOL anterior  chamber intraocular lens; IOL intraocular lens; PCIOL posterior chamber intraocular lens; Phaco/IOL phacoemulsification with intraocular lens placement; [place] photorefractive keratectomy; LASIK laser assisted in situ keratomileusis; HTN hypertension; DM diabetes mellitus; COPD chronic obstructive pulmonary disease

## 2022-05-15 NOTE — Assessment & Plan Note (Signed)
Patient continues on CPAP

## 2022-05-15 NOTE — Assessment & Plan Note (Signed)
Bilateral OU stable over time.  Good acuity.  Maintain in my opinion on the use of continued CPAP use to prevent nightly hypoxic stress to the macular regions

## 2022-05-15 NOTE — Assessment & Plan Note (Signed)
Early PC opacification is present not significant but nearing visual axis OS

## 2022-05-24 ENCOUNTER — Ambulatory Visit: Payer: Medicare PPO | Admitting: Podiatry

## 2022-05-24 ENCOUNTER — Encounter: Payer: Self-pay | Admitting: Podiatry

## 2022-05-24 DIAGNOSIS — M79676 Pain in unspecified toe(s): Secondary | ICD-10-CM

## 2022-05-24 DIAGNOSIS — B351 Tinea unguium: Secondary | ICD-10-CM

## 2022-05-24 NOTE — Patient Instructions (Signed)

## 2022-05-25 NOTE — Progress Notes (Signed)
Subjective:   Patient ID: Angela [name], female   DOB: 76 y.o.   MRN: [id number]   HPI Patient states that she does have elongated nails that are bothersome and has a irritation of the right hallux lateral border that is hard for her to trim out and seems deep   ROS      Objective:  Physical Exam  Neurovascular status intact with thick yellow brittle nailbeds 1-5 both feet that are dystrophic with a incurvation of the lateral border of the right hallux     Assessment:  Mycotic nail infection with pain 1-5 both feet with deeper ingrown component right hallux lateral border     Plan:  Debrided nails 1-5 both feet and debrided the lateral border right hallux deeper and it may require procedure someday in the future and no angiogenic bleeding was noted

## 2022-06-01 DIAGNOSIS — G4733 Obstructive sleep apnea (adult) (pediatric): Secondary | ICD-10-CM | POA: Diagnosis not present

## 2022-06-04 ENCOUNTER — Encounter: Payer: Self-pay | Admitting: *Deleted

## 2022-06-07 DIAGNOSIS — G4733 Obstructive sleep apnea (adult) (pediatric): Secondary | ICD-10-CM | POA: Diagnosis not present

## 2022-07-01 DIAGNOSIS — G4733 Obstructive sleep apnea (adult) (pediatric): Secondary | ICD-10-CM | POA: Diagnosis not present

## 2022-07-15 ENCOUNTER — Other Ambulatory Visit: Payer: Self-pay | Admitting: Cardiology

## 2022-08-01 DIAGNOSIS — G4733 Obstructive sleep apnea (adult) (pediatric): Secondary | ICD-10-CM | POA: Diagnosis not present

## 2022-08-20 ENCOUNTER — Ambulatory Visit: Payer: Medicare PPO | Admitting: Podiatry

## 2022-08-29 DIAGNOSIS — R509 Fever, unspecified: Secondary | ICD-10-CM | POA: Diagnosis not present

## 2022-08-29 DIAGNOSIS — J189 Pneumonia, unspecified organism: Secondary | ICD-10-CM | POA: Diagnosis not present

## 2022-08-29 DIAGNOSIS — J029 Acute pharyngitis, unspecified: Secondary | ICD-10-CM | POA: Diagnosis not present

## 2022-08-29 DIAGNOSIS — E119 Type 2 diabetes mellitus without complications: Secondary | ICD-10-CM | POA: Diagnosis not present

## 2022-08-29 DIAGNOSIS — R059 Cough, unspecified: Secondary | ICD-10-CM | POA: Diagnosis not present

## 2022-08-31 ENCOUNTER — Ambulatory Visit: Payer: Medicare PPO | Admitting: Podiatry

## 2022-08-31 DIAGNOSIS — J09X2 Influenza due to identified novel influenza A virus with other respiratory manifestations: Secondary | ICD-10-CM | POA: Diagnosis not present

## 2022-08-31 DIAGNOSIS — G4733 Obstructive sleep apnea (adult) (pediatric): Secondary | ICD-10-CM | POA: Diagnosis not present

## 2022-08-31 DIAGNOSIS — J189 Pneumonia, unspecified organism: Secondary | ICD-10-CM | POA: Diagnosis not present

## 2022-08-31 DIAGNOSIS — I10 Essential (primary) hypertension: Secondary | ICD-10-CM | POA: Diagnosis not present

## 2022-08-31 DIAGNOSIS — R059 Cough, unspecified: Secondary | ICD-10-CM | POA: Diagnosis not present

## 2022-08-31 DIAGNOSIS — R509 Fever, unspecified: Secondary | ICD-10-CM | POA: Diagnosis not present

## 2022-09-13 ENCOUNTER — Ambulatory Visit (HOSPITAL_COMMUNITY): Payer: Medicare PPO

## 2022-09-13 ENCOUNTER — Other Ambulatory Visit (HOSPITAL_COMMUNITY): Payer: Medicare PPO

## 2022-09-14 ENCOUNTER — Ambulatory Visit: Payer: Medicare PPO | Admitting: Podiatry

## 2022-09-14 ENCOUNTER — Other Ambulatory Visit: Payer: Self-pay | Admitting: Family Medicine

## 2022-09-14 ENCOUNTER — Encounter: Payer: Self-pay | Admitting: Podiatry

## 2022-09-14 DIAGNOSIS — L600 Ingrowing nail: Secondary | ICD-10-CM

## 2022-09-14 DIAGNOSIS — G629 Polyneuropathy, unspecified: Secondary | ICD-10-CM

## 2022-09-14 DIAGNOSIS — B351 Tinea unguium: Secondary | ICD-10-CM

## 2022-09-14 DIAGNOSIS — M79609 Pain in unspecified limb: Secondary | ICD-10-CM

## 2022-09-14 NOTE — Progress Notes (Signed)
Complaint:  Visit Type: Patient returns to my office for continued preventative foot care services. Complaint: Patient states" my nails in her big toes and ingrown and  painful to walk and wear shoes" . The patient presents for preventative foot care services. No changes to ROS .  Patient says she has had her right big toe worked on twice and the nail is starting to become ingrown and painful again.  Podiatric Exam: Vascular: dorsalis pedis and posterior tibial pulses are palpable bilateral. Capillary return is immediate. Temperature gradient is WNL. Skin turgor WNL  Sensorium: Normal Semmes Weinstein monofilament test. Normal tactile sensation bilaterally. Nail Exam: Pt has thick disfigured discolored nails with subungual debris and pincer toenail right hallux. Ulcer Exam: There is no evidence of ulcer or pre-ulcerative changes or infection. Orthopedic Exam: Muscle tone and strength are WNL. No limitations in general ROM. No crepitus or effusions noted.. Bony prominences are unremarkable. Skin: No Porokeratosis. No infection or ulcers  Diagnosis:  Onychomycosis, , Pain in right toe, pain in left toes,  Neuropathy.  Treatment & Plan Procedures and Treatment: Consent by patient was obtained for treatment procedures.   Debridement of mycotic and hypertrophic toenails, 1 through 5 bilateral and clearing of subungual debris. No ulceration, no infection noted.  Discussed nail with patient and told her to have nail surgery to kill the nail root right big toe. Return Visit-Office Procedure: Patient instructed to return to the office for a follow up visit 3 months  for continued evaluation and treatment.    Gardiner Barefoot DPM

## 2022-09-19 ENCOUNTER — Inpatient Hospital Stay: Payer: Medicare PPO | Attending: Hematology

## 2022-09-19 ENCOUNTER — Ambulatory Visit (HOSPITAL_COMMUNITY)
Admission: RE | Admit: 2022-09-19 | Discharge: 2022-09-19 | Disposition: A | Discharge status: Home / Self Care | Payer: Medicare PPO | Source: Ambulatory Visit | Attending: Hematology | Admitting: Hematology

## 2022-09-19 DIAGNOSIS — Z170 Estrogen receptor positive status [ER+]: Secondary | ICD-10-CM

## 2022-09-19 DIAGNOSIS — Z853 Personal history of malignant neoplasm of breast: Secondary | ICD-10-CM | POA: Insufficient documentation

## 2022-09-19 DIAGNOSIS — Z1231 Encounter for screening mammogram for malignant neoplasm of breast: Secondary | ICD-10-CM | POA: Diagnosis not present

## 2022-09-19 DIAGNOSIS — Z79810 Long term (current) use of selective estrogen receptor modulators (SERMs): Secondary | ICD-10-CM | POA: Insufficient documentation

## 2022-09-19 DIAGNOSIS — M8580 Other specified disorders of bone density and structure, unspecified site: Secondary | ICD-10-CM | POA: Diagnosis not present

## 2022-09-19 LAB — CBC WITH DIFFERENTIAL/PLATELET
Abs Immature Granulocytes: 0.01 10*3/uL (ref 0.00–0.07)
Basophils Absolute: 0 10*3/uL (ref 0.0–0.1)
Basophils Relative: 0 %
Eosinophils Absolute: 0.1 10*3/uL (ref 0.0–0.5)
Eosinophils Relative: 2 %
HCT: 40.9 % (ref 36.0–46.0)
Hemoglobin: 13.7 g/dL (ref 12.0–15.0)
Immature Granulocytes: 0 %
Lymphocytes Relative: 28 %
Lymphs Abs: 2 10*3/uL (ref 0.7–4.0)
MCH: 32.2 pg (ref 26.0–34.0)
MCHC: 33.5 g/dL (ref 30.0–36.0)
MCV: 96 fL (ref 80.0–100.0)
Monocytes Absolute: 0.8 10*3/uL (ref 0.1–1.0)
Monocytes Relative: 12 %
Neutro Abs: 4.2 10*3/uL (ref 1.7–7.7)
Neutrophils Relative %: 58 %
Platelets: 234 10*3/uL (ref 150–400)
RBC: 4.26 MIL/uL (ref 3.87–5.11)
RDW: 11.9 % (ref 11.5–15.5)
WBC: 7.2 10*3/uL (ref 4.0–10.5)
nRBC: 0 % (ref 0.0–0.2)

## 2022-09-19 LAB — COMPREHENSIVE METABOLIC PANEL
ALT: 33 U/L (ref 0–44)
AST: 31 U/L (ref 15–41)
Albumin: 4.3 g/dL (ref 3.5–5.0)
Alkaline Phosphatase: 57 U/L (ref 38–126)
Anion gap: 8 (ref 5–15)
BUN: 19 mg/dL (ref 8–23)
CO2: 29 mmol/L (ref 22–32)
Calcium: 9.8 mg/dL (ref 8.9–10.3)
Chloride: 102 mmol/L (ref 98–111)
Creatinine, Ser: 0.7 mg/dL (ref 0.44–1.00)
GFR, Estimated: >60 mL/min (ref >60)
Glucose, Bld: 108 mg/dL — ABNORMAL HIGH (ref 70–99)
Potassium: 4.4 mmol/L (ref 3.5–5.1)
Sodium: 139 mmol/L (ref 135–145)
Total Bilirubin: 0.6 mg/dL (ref 0.3–1.2)
Total Protein: 7 g/dL (ref 6.5–8.1)

## 2022-09-20 ENCOUNTER — Ambulatory Visit (HOSPITAL_COMMUNITY): Payer: Medicare PPO | Admitting: Hematology

## 2022-09-20 DIAGNOSIS — G4733 Obstructive sleep apnea (adult) (pediatric): Secondary | ICD-10-CM | POA: Diagnosis not present

## 2022-09-21 LAB — MISC LABCORP TEST (SEND OUT): Labcorp test code: 81950

## 2022-09-27 ENCOUNTER — Inpatient Hospital Stay: Payer: Medicare PPO | Admitting: Hematology

## 2022-09-27 VITALS — BP 137/78 | HR 58 | Temp 98.1°F | Resp 16 | Wt 173.8 lb

## 2022-09-27 DIAGNOSIS — Z853 Personal history of malignant neoplasm of breast: Secondary | ICD-10-CM

## 2022-09-27 DIAGNOSIS — M8580 Other specified disorders of bone density and structure, unspecified site: Secondary | ICD-10-CM | POA: Diagnosis not present

## 2022-09-27 DIAGNOSIS — Z79810 Long term (current) use of selective estrogen receptor modulators (SERMs): Secondary | ICD-10-CM | POA: Diagnosis not present

## 2022-09-27 NOTE — Progress Notes (Signed)
Angela [name] [address] [place] [address]   Patient Care Team: Marin Olp, MD as PCP - General (Family Medicine) Satira Sark, MD as PCP - Cardiology (Cardiology) Jovita Kussmaul, MD as Consulting Physician (General Surgery) Thea Silversmith, MD as Consulting Physician (Radiation Oncology) Inda Castle, MD (Inactive) as Consulting Physician (Gastroenterology) Danice Goltz, MD as Consulting Physician (Ophthalmology) Zadie Rhine Clent Demark, MD as Consulting Physician (Ophthalmology) Chesley Mires, MD as Consulting Physician (Pulmonary Disease) Derek Jack, MD as Consulting Physician (Hematology) Gardiner Barefoot, DPM as Consulting Physician (Podiatry) [name], MD (Inactive) as Consulting Physician (Dermatology)  SUMMARY OF ONCOLOGIC HISTORY: Oncology History   No history exists.    CHIEF COMPLIANT: Follow-up for left breast cancer   INTERVAL HISTORY: [name] is a 77 y.o. female seen for follow-up of left breast cancer.  Denies any new onset pains.  She has stopped Fosamax.  Reports energy level 76%.  Numbness in the feet and toes has been stable.  REVIEW OF SYSTEMS:   Review of Systems  Constitutional:  Negative for appetite change and fatigue.  Neurological:  Positive for numbness (feet).  All other systems reviewed and are negative.   I have reviewed the past medical history, past surgical history, social history and family history with the patient and they are unchanged from previous note.   ALLERGIES:   is allergic to lisinopril and statins.   MEDICATIONS:  Current Outpatient Medications  Medication Sig Dispense Refill   acetaminophen (TYLENOL) 500 MG tablet acetaminophen 500 mg tablet  Take 2 tablets every 6 hours by oral route as needed.     albuterol (VENTOLIN HFA) 108 (90 Base) MCG/ACT inhaler Inhale 2 puffs every 4 hours by inhalation route.     aspirin 81 MG tablet Take 81 mg by mouth every  evening.     Bromfenac Sodium 0.09 % SOLN      Calcium Carbonate-Vitamin D 600-400 MG-UNIT tablet Take 2 tablets by mouth every evening.      Cholecalciferol (VITAMIN D3) 50 MCG (2000 UT) capsule Take 2,000 Units by mouth every evening.      Coenzyme Q10 (COQ-10) 100 MG CAPS Take 100 mg by mouth daily.      fish oil-omega-3 fatty acids 1000 MG capsule Take 1 g by mouth every evening.      hydrochlorothiazide (HYDRODIURIL) 25 MG tablet Take 1 tablet (25 mg total) by mouth daily. 90 tablet 3   isosorbide mononitrate (IMDUR) 30 MG 24 hr tablet TAKE 1 TABLET(30 MG) BY MOUTH TWICE DAILY 180 tablet 3   metFORMIN (GLUCOPHAGE) 500 MG tablet TAKE 1 TABLET(500 MG) BY MOUTH TWICE DAILY WITH A MEAL 180 tablet 3   metoprolol succinate (TOPROL-XL) 50 MG 24 hr tablet Take with or immediately following a meal. 90 tablet 3   Multiple Vitamin (MULTIVITAMIN) tablet Take 1 tablet by mouth every evening.     potassium chloride SA (KLOR-CON M) 20 MEQ tablet TAKE 1 TABLET BY MOUTH EVERY DAY 90 tablet 3   rosuvastatin (CRESTOR) 40 MG tablet TAKE 1 TABLET(40 MG) BY MOUTH DAILY 90 tablet 3   vitamin C (ASCORBIC ACID) 500 MG tablet Take 500 mg by mouth every evening.      nitroGLYCERIN (NITROSTAT) 0.4 MG SL tablet ONE TABLET UNDER TONGUE AS NEEDED FOR CHEST PAIN (Patient not taking: Reported on 09/27/2022) 25 tablet 6   No current facility-administered medications for this visit.     PHYSICAL EXAMINATION: Performance status (  ECOG): 1 - Symptomatic but completely ambulatory  Vitals:   09/27/22 1147  BP: 137/78  Pulse: (!) 58  Resp: 16  Temp: 98.1 F (36.7 C)  SpO2: 98%   Wt Readings from Last 3 Encounters:  09/27/22 173 lb 12.8 oz (78.8 kg)  03/28/22 169 lb (76.7 kg)  03/22/22 171 lb 8 oz (77.8 kg)   Physical Exam Vitals reviewed.  Constitutional:      Appearance: Normal appearance.  Cardiovascular:     Rate and Rhythm: Normal rate and regular rhythm.     Pulses: Normal pulses.     Heart sounds:  Normal heart sounds.  Pulmonary:     Effort: Pulmonary effort is normal.     Breath sounds: Normal breath sounds.  Chest:  Breasts:    Right: Normal. No swelling, bleeding, inverted nipple, mass, nipple discharge, skin change or tenderness.     Left: Normal. No swelling, bleeding, inverted nipple, mass, nipple discharge, skin change or tenderness.  Abdominal:     Palpations: Abdomen is soft. There is no hepatomegaly, splenomegaly or mass.     Tenderness: There is no abdominal tenderness.  Lymphadenopathy:     Upper Body:     Right upper body: No supraclavicular, axillary or pectoral adenopathy.     Left upper body: No supraclavicular, axillary or pectoral adenopathy.  Neurological:     General: No focal deficit present.     Mental Status: She is alert and oriented to person, place, and time.  Psychiatric:        Mood and Affect: Mood normal.        Behavior: Behavior normal.     Breast Exam Chaperone: Thana Ates     LABORATORY DATA:  I have reviewed the data as listed    Latest Ref Rng & Units 09/19/2022   10:13 AM 03/06/2022   11:13 AM 09/13/2021    9:10 AM  CMP  Glucose 70 - 99 mg/dL 108  98  105   BUN 8 - 23 mg/dL '19  14  16   '$ Creatinine 0.44 - 1.00 mg/dL 0.70  0.58  0.56   Sodium 135 - 145 mmol/L 139  140  139   Potassium 3.5 - 5.1 mmol/L 4.4  3.9  3.7   Chloride 98 - 111 mmol/L 102  105  103   CO2 22 - 32 mmol/L '29  30  28   '$ Calcium 8.9 - 10.3 mg/dL 9.8  9.4  9.7   Total Protein 6.5 - 8.1 g/dL 7.0  6.5  7.2   Total Bilirubin 0.3 - 1.2 mg/dL 0.6  0.5  0.8   Alkaline Phos 38 - 126 U/L 57  47  54   AST 15 - 41 U/L 31  22  35   ALT 0 - 44 U/L 33  20  39    No results found for: "CAN153" Lab Results  Component Value Date   WBC 7.2 09/19/2022   HGB 13.7 09/19/2022   HCT 40.9 09/19/2022   MCV 96.0 09/19/2022   PLT 234 09/19/2022   NEUTROABS 4.2 09/19/2022    ASSESSMENT:  1.  Stage I left breast cancer: -Diagnosed in 2009, status post lumpectomy and radiation  therapy -Tamoxifen followed by Arimidex completed in December 2014   2.  Osteopenia: - Last DEXA scan on 07/01/2017 shows T score of -1.6. -She is started on Prolia on 01/04/2014.  Last dose was 08/29/2018 and she is tolerating it well. -DEXA scan on 08/26/2019 shows  T score -1.6.   PLAN:  1.  Stage I left breast cancer: - Physical examination today shows contracted left breast with no clear palpable masses.  Right breast has no palpable masses.  No palpable adenopathy. - Labs from 09/19/2022 shows normal LFTs and CBC. - Mammogram (09/19/2022) BI-RADS Category 1. - She will continue yearly mammograms and may follow-up with her primary doctor.  She is agreeable.  She will call us if she needs [place].   2.  Osteopenia: - Prolia injection from 2015 through 09/15/2020. - DEXA scan from 08/28/2021 with T-score -1.4, osteopenia. - She has taken Fosamax after that and discontinued it. - Continue calcium and vitamin D supplements.  Vitamin D level was normal.   Breast Cancer therapy associated bone loss: I have recommended calcium, Vitamin D and weight bearing exercises.  Orders placed this encounter:  No orders of the defined types were placed in this encounter.   The patient has a good understanding of the overall plan. She agrees with it. She will call with any problems that may develop before the next visit here.  Derek Jack, MD [place] [phone]

## 2022-09-27 NOTE — Patient Instructions (Addendum)
[facility]  Discharge Instructions  You were seen and examined today by Dr. Delton Coombes.  Your last mammogram was normal just last week.   Follow-up as needed.  Thank you for choosing [facility] to provide your oncology and hematology care.   To afford each patient quality time with our provider, please arrive at least 15 minutes before your scheduled appointment time. You may need to reschedule your appointment if you arrive late (10 or more minutes). Arriving late affects you and other patients whose appointments are after yours.  Also, if you miss three or more appointments without notifying the office, you may be dismissed from the clinic at the provider's discretion.    Again, thank you for choosing [facility].  Our hope is that these requests will decrease the amount of time that you wait before being seen by our physicians.   If you have a lab appointment with the [facility] please come in thru the Main Entrance and check in at the main information desk.           _____________________________________________________________  Should you have questions after your visit to [facility], please contact our office at [phone] and follow the prompts.  Our office hours are 8:00 a.m. to 4:30 p.m. Monday - Thursday and 8:00 a.m. to 2:30 p.m. Friday.  Please note that voicemails left after 4:00 p.m. may not be returned until the following business day.  We are closed weekends and all major holidays.  You do have access to a nurse 24-7, just call the main number to the clinic [phone] and do not press any options, hold on the line and a nurse will answer the phone.    For prescription refill requests, have your pharmacy contact our office and allow 72 hours.    Masks are optional in the cancer centers. If you would like for your care team to wear a mask while they are taking care of you, please let  them know. You may have one support person who is at least 76 years old accompany you for your appointments.

## 2022-10-01 DIAGNOSIS — G4733 Obstructive sleep apnea (adult) (pediatric): Secondary | ICD-10-CM | POA: Diagnosis not present

## 2022-10-02 ENCOUNTER — Ambulatory Visit (INDEPENDENT_AMBULATORY_CARE_PROVIDER_SITE_OTHER): Payer: Medicare PPO | Admitting: Family Medicine

## 2022-10-02 ENCOUNTER — Encounter: Payer: Self-pay | Admitting: Family Medicine

## 2022-10-02 VITALS — BP 128/78 | HR 63 | Temp 97.0°F | Ht 66.0 in | Wt 175.0 lb

## 2022-10-02 DIAGNOSIS — M810 Age-related osteoporosis without current pathological fracture: Secondary | ICD-10-CM | POA: Diagnosis not present

## 2022-10-02 DIAGNOSIS — E785 Hyperlipidemia, unspecified: Secondary | ICD-10-CM | POA: Diagnosis not present

## 2022-10-02 DIAGNOSIS — Z23 Encounter for immunization: Secondary | ICD-10-CM | POA: Diagnosis not present

## 2022-10-02 DIAGNOSIS — E559 Vitamin D deficiency, unspecified: Secondary | ICD-10-CM | POA: Diagnosis not present

## 2022-10-02 DIAGNOSIS — G629 Polyneuropathy, unspecified: Secondary | ICD-10-CM | POA: Diagnosis not present

## 2022-10-02 DIAGNOSIS — Z0000 Encounter for general adult medical examination without abnormal findings: Secondary | ICD-10-CM | POA: Diagnosis not present

## 2022-10-02 DIAGNOSIS — Z79899 Other long term (current) drug therapy: Secondary | ICD-10-CM | POA: Diagnosis not present

## 2022-10-02 DIAGNOSIS — I25119 Atherosclerotic heart disease of native coronary artery with unspecified angina pectoris: Secondary | ICD-10-CM | POA: Diagnosis not present

## 2022-10-02 DIAGNOSIS — R739 Hyperglycemia, unspecified: Secondary | ICD-10-CM | POA: Diagnosis not present

## 2022-10-02 LAB — HEMOGLOBIN A1C: Hgb A1c MFr Bld: 6.2 % (ref 4.6–6.5)

## 2022-10-02 LAB — LIPID PANEL
Cholesterol: 114 mg/dL (ref 0–200)
HDL: 35.4 mg/dL — ABNORMAL LOW (ref >39.00)
LDL Cholesterol: 57 mg/dL (ref 0–99)
NonHDL: 78.29
Total CHOL/HDL Ratio: 3
Triglycerides: 107 mg/dL (ref 0.0–149.0)
VLDL: 21.4 mg/dL (ref 0.0–40.0)

## 2022-10-02 LAB — TSH: TSH: 1.37 u[IU]/mL (ref 0.35–5.50)

## 2022-10-02 LAB — VITAMIN B12: Vitamin B-12: 307 pg/mL (ref 211–911)

## 2022-10-02 MED ORDER — HYDROCHLOROTHIAZIDE 25 MG PO TABS: 25.0000 mg | ORAL_TABLET | Freq: Every day | ORAL | 3 refills | Status: DC

## 2022-10-02 MED ORDER — METOPROLOL SUCCINATE ER 50 MG PO TB24: ORAL_TABLET | ORAL | 3 refills | Status: DC

## 2022-10-02 NOTE — Patient Instructions (Addendum)
Sign release of information at the check out desk for Dr. Colin Mulders.  Prevnar 20 today  Please stop by lab before you go If you have mychart- we will send your results within 3 business days of [place] receiving them.  If you do not have mychart- we will call you about results within 5 business days of [place] receiving them.  *please also note that you will see labs on mychart as soon as they post. I will later go in and write notes on them- will say "notes from Dr. Yong Channel"   Recommended follow up: Return in about 7 months (around 05/03/2023) for followup or sooner if needed.Schedule b4 you leave.

## 2022-10-02 NOTE — Progress Notes (Signed)
Phone [phone]   Subjective:  Patient presents today for their annual physical. Chief complaint-noted.   See problem oriented charting- ROS- full  review of systems was completed and negative Per full ROS sheet completed by patient   The following were reviewed and entered/updated in epic: Past Medical History:  Diagnosis Date   Allergy    seasonal   Arthritis    BCC (basal cell carcinoma of skin) 08/31/1994   RIGHT CLAVICLE INFERIOR   BCC (basal cell carcinoma of skin) 02/21/1994   RIGHT SCAPULA TX CX3 5FU   BCC (basal cell carcinoma of skin) 02/21/1994   RIGHT CLAVICAL TX CX3 5FU   BCC (basal cell carcinoma of skin) 05/25/1994   RIGHT SCAPULA TX =MOHS   BCC (basal cell carcinoma of skin) 02/02/1999   RIGHT FRONT SHOULDER TX CURET X3 ,EXC   BCC (basal cell carcinoma of skin) 12/25/1999   RIGHT UPPER BACK CX3 5FU   BCC (basal cell carcinoma of skin) 03/29/2005   RIGHT LATER LOWER LEG TX MOHS   BCC (basal cell carcinoma of skin) 11/10/2005   LEFT ANT. NECK TX=MOHS   BCC (basal cell carcinoma of skin) 08/26/2018   RIGHT FRONT SHOULDER TX WITH BX   BCC (basal cell carcinoma) 08/31/1994   RIGHT SUPER CLAVICLE MEDIAL TX CX3 5FU   Breast cancer ([place]) 2009   Left Breast Cancer   CAD (coronary artery disease)    Cataract    Diabetes mellitus without complication ([place])    Essential hypertension    Hyperlipidemia    Macular retinal cyst of right eye    Nuclear sclerotic cataract of left eye 03/08/2020   The nature of cataract was discussed with the patient as well as the elective nature of surgery. The patient was reassured that surgery at a later date does not put the patient at risk for a worse outcome. It was emphasized that the need for surgery is dictated by the patient's quality of life as influenced by the cataract. Patient was instructed to maintain close follow up with their general eye    OSA (obstructive sleep apnea)    CPAP   Osteopenia    Osteopenia     Personal history of radiation therapy    Sleep apnea    Patient Active Problem List   Diagnosis Date Noted   Coronary artery disease involving native coronary artery of native heart with angina pectoris ([place]) 07/24/2019    Priority: High   History of breast cancer 03/20/2011    Priority: High   Neuropathy 11/10/2019    Priority: Medium    OSA (obstructive sleep apnea) 08/24/2015    Priority: Medium    Hyperglycemia 11/12/2014    Priority: Medium    Osteoporosis-due to prior treatment with Prolia/Fosamax 06/04/2014    Priority: Medium    HTN (hypertension) 07/13/2008    Priority: Medium    Hyperlipidemia 07/09/2007    Priority: Medium    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    Priority: Low   Type 2 macular telangiectasis, 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    Priority: Low   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    Priority: Low   History of skin cancer 02/21/2016    Priority: Low   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 09/14/2015    Priority: Low   Chest discomfort 10/05/2014    Priority: Low   Cataract 07/16/2012    Priority: Low   Pseudophakia of right eye 07/16/2012    Priority: Low   Vitamin  D deficiency 07/09/2007    Priority: Low   Pseudophakia of left eye 05/15/2022   Left posterior capsular opacification 05/15/2022   History of colon polyps 06/12/2018   Macular retinal cyst 07/16/2012   Malignant neoplasm of breast (female) ([place]) 03/20/2011   Past Surgical History:  Procedure Laterality Date   BREAST EXCISIONAL BIOPSY Right    BREAST LUMPECTOMY Left 06/2008   CATARACT EXTRACTION  2012   COLONOSCOPY     Eyelid surgery Bilateral    INTRAVASCULAR PRESSURE WIRE/FFR STUDY N/A 06/01/2019   Procedure: INTRAVASCULAR PRESSURE WIRE/FFR STUDY;  Surgeon: Belva Crome, MD;  Location: [facility];  Service: Cardiovascular;  Laterality: N/A;   LEFT HEART CATH AND CORONARY ANGIOGRAPHY N/A 06/01/2019   Procedure: LEFT HEART CATH AND CORONARY ANGIOGRAPHY;   Surgeon: Belva Crome, MD;  Location: [facility];  Service: Cardiovascular;  Laterality: N/A;   MASS EXCISION  03/2008   Appendix   MOHS SURGERY     POLYPECTOMY     SKIN CANCER EXCISION     TONSILLECTOMY      Family History  Problem Relation Age of Onset   Heart attack Mother    Hypertension Mother    Heart disease Father    Heart attack Father    Hypertension Father    Colon cancer Neg Hx    Stroke Neg Hx    Esophageal cancer Neg Hx    Rectal cancer Neg Hx    Stomach cancer Neg Hx    Colon polyps Neg Hx    Crohn's disease Neg Hx     Medications- reviewed and updated Current Outpatient Medications  Medication Sig Dispense Refill   acetaminophen (TYLENOL) 500 MG tablet acetaminophen 500 mg tablet  Take 2 tablets every 6 hours by oral route as needed.     albuterol (VENTOLIN HFA) 108 (90 Base) MCG/ACT inhaler Inhale 2 puffs every 4 hours by inhalation route.     aspirin 81 MG tablet Take 81 mg by mouth every evening.     Bromfenac Sodium 0.09 % SOLN      Calcium Carbonate-Vitamin D 600-400 MG-UNIT tablet Take 2 tablets by mouth every evening.      Cholecalciferol (VITAMIN D3) 50 MCG (2000 UT) capsule Take 2,000 Units by mouth every evening.      Coenzyme Q10 (COQ-10) 100 MG CAPS Take 100 mg by mouth daily.      fish oil-omega-3 fatty acids 1000 MG capsule Take 1 g by mouth every evening.      hydrochlorothiazide (HYDRODIURIL) 25 MG tablet Take 1 tablet (25 mg total) by mouth daily. 90 tablet 3   isosorbide mononitrate (IMDUR) 30 MG 24 hr tablet TAKE 1 TABLET(30 MG) BY MOUTH TWICE DAILY 180 tablet 3   metFORMIN (GLUCOPHAGE) 500 MG tablet TAKE 1 TABLET(500 MG) BY MOUTH TWICE DAILY WITH A MEAL 180 tablet 3   metoprolol succinate (TOPROL-XL) 50 MG 24 hr tablet Take with or immediately following a meal. 90 tablet 3   Multiple Vitamin (MULTIVITAMIN) tablet Take 1 tablet by mouth every evening.     nitroGLYCERIN (NITROSTAT) 0.4 MG SL tablet ONE TABLET UNDER TONGUE AS NEEDED  FOR CHEST PAIN (Patient not taking: Reported on 09/27/2022) 25 tablet 6   potassium chloride SA (KLOR-CON M) 20 MEQ tablet TAKE 1 TABLET BY MOUTH EVERY DAY 90 tablet 3   rosuvastatin (CRESTOR) 40 MG tablet TAKE 1 TABLET(40 MG) BY MOUTH DAILY 90 tablet 3   vitamin C (ASCORBIC ACID) 500  MG tablet Take 500 mg by mouth every evening.      No current facility-administered medications for this visit.    Allergies-reviewed and updated Allergies  Allergen Reactions   Lisinopril Cough   Statins Other (See Comments)    Myalgia with lipitor- unproven She is able to tolerate simvastatin    Social History   Social History Narrative   Married. 2 daughters. 4 grandkids (teenagers in [place] and then two 3 and under in 2021)      Retired from teaching special ed mostly [place]: travel, time with friends, eating out, lots of grandkids time in [place]   Objective  Objective:  BP 128/78   Pulse 63   Temp (!) 97 F (36.1 C)   Ht '5\' 6"'$  (1.676 m)   Wt 175 lb (79.4 kg)   LMP  (LMP Unknown)   SpO2 97%   BMI 28.25 kg/m  Gen: NAD, resting comfortably HEENT: Mucous membranes are moist. Oropharynx normal Neck: no thyromegaly CV: RRR no murmurs rubs or gallops Lungs: CTAB no crackles, wheeze, rhonchi Abdomen: soft/nontender/nondistended/normal bowel sounds. No rebound or guarding.  Ext: no edema Skin: warm, dry Neuro: grossly normal, moves all extremities, PERRLA   Assessment and Plan   77 y.o. female presenting for annual physical.  Health Maintenance counseling: 1. Anticipatory guidance: Patient counseled regarding regular dental exams -q6 months, eye exams - goes more than yearly,  avoiding smoking and second hand smoke , limiting alcohol to 1 beverage per day- minimial intake- glass of wine , no illicit drugs .   2. Risk factor reduction:  Advised patient of need for regular exercise and diet rich and fruits and vegetables to reduce risk of heart attack and stroke.   Exercise-walking 3 days a week- has maintained. Could consider some strength training Diet/weight management-weight up 3 pounds from last year- reasonably healthy diet.  Wt Readings from Last 3 Encounters:  10/02/22 175 lb (79.4 kg)  09/27/22 173 lb 12.8 oz (78.8 kg)  03/28/22 169 lb (76.7 kg)  3. Immunizations/screenings/ancillary studies-fully up-to-date on COVID-discussed error in epic (husband had covd 2 weeks ago and she did not test positive), otherwise up-to-date reviewed option of Prevnar 20  Immunization History  Administered Date(s) Administered   Fluad Quad(high Dose 65+) 06/12/2019, 05/05/2020, 05/04/2022   Hep A / Hep B 03/16/2013, 10/06/2013   Hepatitis B, adult 04/17/2013   Influenza Split 06/09/2012   Influenza Whole 05/22/2011   Influenza,inj,Quad PF,6+ Mos 05/27/2013   Influenza-Unspecified 06/10/2014, 05/24/2015, 04/24/2016, 04/26/2017, 05/11/2018, 06/06/2021   Moderna SARS-COV2 Booster Vaccination 12/08/2020, 05/30/2021   Moderna Sars-Covid-2 Vaccination 10/13/2019, 11/12/2019, 07/05/2020, 01/31/2022   Pfizer Covid-19 Vaccine Bivalent Booster 43yr & up 06/06/2022   Pneumococcal Conjugate-13 11/12/2014   Pneumococcal Polysaccharide-23 08/24/2008, 07/17/2013   Respiratory Syncytial Virus Vaccine,Recomb Aduvanted(Arexvy) 05/04/2022   Td 04/19/2010   Tdap 04/05/2016   Zoster Recombinat (Shingrix) 07/01/2017, 09/20/2017   Zoster, Live 11/17/2008   4. Cervical cancer screening- past age based screening recommendations- stopped seeing GYN. No vaginal bleeding or discharge  5. Breast cancer screening-  breast exam - opts out in office- and mammogram done 09/19/2022 and completes yearly.  History of breast cancer see below. Released to primary care by oncology as doing very well.  6. Colon cancer screening - last colonoscopy done 01/09/2022 with plan for no repeat due to age despite prior sessile serrated polyps with risks likely outweighing benefits  -Notes from last GI  visit "#History of precancerous  colon polyp-noted May 2023 by Dr. Orma Flaming polyp(s) removed were precancerous.  Normally 5 year colonoscopy follow up is recommended.  In general, we stop routine screening  colonoscopy around 80.  Accordingly, we will not contact you to schedule a follow up exam."  7. Skin cancer screening-follows with Dr. Denna Haggard annually previously but his office is closed-has appt with [facility] dermatology . advised regular sunscreen use. Denies worrisome, changing, or new skin lesions.  8. Birth control/STD check- postmenopausal/only active with husband  37. Osteoporosis screening at 53- see below  10. Smoking associated screening - never smoker  Status of chronic or acute concerns   #social update- traveling to [place] and we discussed possibly prescribing paxlovid  #In December- had pneumonia and then later tested positive for influenza- has recovered well   #History of breast cancer-status post lumpectomy and radiation therapy in 2009.  Completed Arimidex December 2014 after initially starting on tamoxifen.  Continues yearly mammograms as above  #Coronary artery disease of native artery of native heart with stable angina pectoris -follows with DR. McDowell  #hyperlipidemia S: Medication: aspirin 81 mg, imdur '30mg'$ , nitroglycerin prn, rosuvastatin 40 mg, fish oil -occasional mild right sided discomfort that she has discussed with cardiology- tums works Lab Results  Component Value Date   CHOL 95 03/06/2022   HDL 30.30 (L) 03/06/2022   LDLCALC 44 03/06/2022   LDLDIRECT 68.0 08/24/2021   TRIG 102.0 03/06/2022   CHOLHDL 3 03/06/2022   A/P: CAD asymptomatic with no chest pain (that sounds to be cardiac related) or shortness of breath on Imdur reported-continue current medications Lipids at goal with LDL under 70 but she would like to update full panel   #hypertension S: medication:  hctz '25mg'$  (also on potassium), imdur '30mg'$ , metoprolol '50mg'$  XR  -119/75 when most recently  checked -has not had hydrochlorothiazide yet today BP Readings from Last 3 Encounters:  10/02/22 128/78  09/27/22 137/78  03/28/22 130/80   A/P: stable- continue current medicines   # Hyperglycemia/insulin resistance/prediabetes- peak a1c of 6.0 July 2019 S:  Medication: metformin '500mg'$  BID. See above on diet/exercise Lab Results  Component Value Date   HGBA1C 6.1 03/06/2022   HGBA1C 6.1 08/24/2021   HGBA1C 6.2 01/31/2021   A/P: Hopefuly stable or improved- update A1c with labs today. Continue current meds for now.  #Vitamin D deficiency/osteoporosis- on prolia S: Medication:  2000 units daily  we think - but basically whatever she is on she should stay on because looks good -I reached out to Dr. Delton Coombes about alendronate for 1 year after finishing prolia for 9 years and physican was on board- may 2023 finished Last vitamin D- just checked 09/19/22 by oncology and 43.8 A/P: will update bone density next visit to evaluate progress- last done 08/2021   #neuropathy- unknown cause- ? Prediabetes vs. B12 (checking 2024) or tsh (check 2024). Never had chemo. Will monitor   Recommended follow up: Return in about 6 months (around 04/02/2023) for followup or sooner if needed.Schedule b4 you leave. Future Appointments  Date Time Provider [place]  10/08/2022  9:40 AM Satira Sark, MD [facility] [place] [facility]  10/09/2022 10:00 AM [facility] CO[facility]  11/13/2022 10:45 AM Rankin, Clent Demark, MD [facility] None  12/21/2022  9:30 AM Gardiner Barefoot, DPM [facility]   Lab/Order associations: fasting   ICD-10-CM   1. Preventative health care  Z00.00     2. Hyperlipidemia, unspecified hyperlipidemia type  E78.5 Lipid panel    TSH  3. Hyperglycemia  R73.9 HgB A1c    4. Age-related osteoporosis without current pathological fracture  M81.0     5. Vitamin D deficiency  E55.9     6. Coronary artery disease involving native coronary artery of native heart with angina  pectoris (HCC) Chronic I25.119     7. High risk medication use  Z79.899 Vitamin B12    8. Neuropathy  G62.9 Vitamin B12     Meds ordered this encounter  Medications   metoprolol succinate (TOPROL-XL) 50 MG 24 hr tablet    Sig: Take with or immediately following a meal.    Dispense:  90 tablet    Refill:  3   hydrochlorothiazide (HYDRODIURIL) 25 MG tablet    Sig: Take 1 tablet (25 mg total) by mouth daily.    Dispense:  90 tablet    Refill:  3   Return precautions advised.  Garret Reddish, MD

## 2022-10-02 NOTE — Addendum Note (Signed)
Addended by: Wyvonna Plum on: 10/02/2022 10:33 AM   Modules accepted: Orders

## 2022-10-05 NOTE — Progress Notes (Signed)
Cardiology Office Note  Date: 10/08/2022   ID: Angela, [name] [birth year], MRN [id number]  PCP:  Shelva Majestic, MD  Cardiologist:  Nona Dell, MD Electrophysiologist:  None   Chief Complaint  Patient presents with   Cardiac follow-up    History of Present Illness: Angela [name] is a 77 y.o. female last seen in July 2023.  She is here with her husband for a routine visit.  She reports no major change in status from a cardiac perspective.  Still walking at least 3 days a week for exercise.  She does not describe any increasing nitroglycerin requirement.  Also compliant with medications which are outlined below.  I reviewed her most recent lab work per Dr. Durene Cal.  She is being treated for prediabetes, currently on Glucophage.  I did mention to them possibility of considering an SGLT2 inhibitor for additional cardiovascular risk reduction.  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 NECK TX=MOHS   BCC (basal cell carcinoma of skin) 08/26/2018   RIGHT FRONT SHOULDER TX WITH BX   BCC (basal cell carcinoma) 08/31/1994   RIGHT SUPER CLAVICLE MEDIAL TX CX3 5FU   Breast cancer ([facility]) 2009   Left Breast Cancer   CAD (coronary artery disease)    Cataract    Diabetes mellitus without complication ([facility])    Essential hypertension    Hyperlipidemia     Macular retinal cyst of right eye    Nuclear sclerotic cataract of left eye 03/08/2020   The nature of cataract was discussed with the patient as well as the elective nature of surgery. The patient was reassured that surgery at a later date does not put the patient at risk for a worse outcome. It was emphasized that the need for surgery is dictated by the patient's quality of life as influenced by the cataract. Patient was instructed to maintain close follow up with their general eye    OSA (obstructive sleep apnea)    CPAP   Osteopenia    Osteopenia    Personal history of radiation therapy    Sleep apnea     Current Outpatient Medications  Medication Sig Dispense Refill   acetaminophen (TYLENOL) 500 MG tablet acetaminophen 500 mg tablet  Take 2 tablets every 6 hours by oral route as needed.     aspirin 81 MG tablet Take 81 mg by mouth every evening.     Bromfenac Sodium 0.09 % SOLN      Calcium Carbonate-Vitamin D 600-400 MG-UNIT tablet Take 2 tablets by mouth every evening.      Cholecalciferol (VITAMIN D3) 50 MCG (2000 UT) capsule Take 2,000 Units by mouth every evening.      Coenzyme Q10 (COQ-10)  100 MG CAPS Take 100 mg by mouth daily.      Cyanocobalamin (VITAMIN B 12 PO) Take by mouth.     fish oil-omega-3 fatty acids 1000 MG capsule Take 1 g by mouth every evening.      hydrochlorothiazide (HYDRODIURIL) 25 MG tablet Take 1 tablet (25 mg total) by mouth daily. 90 tablet 3   isosorbide mononitrate (IMDUR) 30 MG 24 hr tablet TAKE 1 TABLET(30 MG) BY MOUTH TWICE DAILY 180 tablet 3   metFORMIN (GLUCOPHAGE) 500 MG tablet TAKE 1 TABLET(500 MG) BY MOUTH TWICE DAILY WITH A MEAL 180 tablet 3   metoprolol succinate (TOPROL-XL) 50 MG 24 hr tablet Take with or immediately following a meal. 90 tablet 3   Multiple Vitamin (MULTIVITAMIN) tablet Take 1 tablet by mouth every evening.     nitroGLYCERIN (NITROSTAT) 0.4 MG SL tablet ONE TABLET UNDER TONGUE AS NEEDED FOR CHEST PAIN 25 tablet 6    potassium chloride SA (KLOR-CON M) 20 MEQ tablet TAKE 1 TABLET BY MOUTH EVERY DAY 90 tablet 3   rosuvastatin (CRESTOR) 40 MG tablet TAKE 1 TABLET(40 MG) BY MOUTH DAILY 90 tablet 3   vitamin C (ASCORBIC ACID) 500 MG tablet Take 500 mg by mouth every evening.      No current facility-administered medications for this visit.   Allergies:  Lisinopril and Statins   ROS: No orthopnea or PND.  No syncope.  Physical Exam: VS:  BP 138/76   Pulse 71   Ht 5\' 6"  (1.676 m)   Wt 176 lb (79.8 kg)   LMP  (LMP Unknown)   SpO2 96%   BMI 28.41 kg/m , BMI Body mass index is 28.41 kg/m.  Wt Readings from Last 3 Encounters:  10/08/22 176 lb (79.8 kg)  10/02/22 175 lb (79.4 kg)  09/27/22 173 lb 12.8 oz (78.8 kg)    General: Patient appears comfortable at rest. HEENT: Conjunctiva and lids normal. Neck: Supple, no elevated JVP or carotid bruits. Lungs: Clear to auscultation, nonlabored breathing at rest. Cardiac: Regular rate and rhythm, no S3 or significant systolic murmur.  ECG:  An ECG dated 03/28/2022 was personally reviewed today and demonstrated:  Sinus bradycardia.  Recent Labwork: 09/19/2022: ALT 33; AST 31; BUN 19; Creatinine, Ser 0.70; Hemoglobin 13.7; Platelets 234; Potassium 4.4; Sodium 139 10/02/2022: TSH 1.37     Component Value Date/Time   CHOL 114 10/02/2022 1019   CHOL 169 01/14/2017 0835   TRIG 107.0 10/02/2022 1019   HDL 35.40 (L) 10/02/2022 1019   HDL 34 (L) 01/14/2017 0835   CHOLHDL 3 10/02/2022 1019   VLDL 21.4 10/02/2022 1019   LDLCALC 57 10/02/2022 1019   LDLCALC 90 01/14/2017 0835   LDLDIRECT 68.0 08/24/2021 1419   Other Studies Reviewed Today:  Cardiac catheterization 06/01/2019: Right dominant coronary anatomy. Moderate to heavy LAD and circumflex calcification. Widely patent left main. The proximal to mid LAD is heavily calcified and contains segmental 50% tubular narrowing over approximately 20 mm.  The mid LAD contains relatively focal calcified 65 to 75%  stenosis.  The LAD beyond this segment is widely patent.  The first diagonal contains 60% ostial narrowing and arises from the calcified tubular stenosis. Both DFR (0.9) and FFR (0.82) Circumflex coronary artery contains no significant obstruction.  The second obtuse marginal contains 50 to 60% ostial narrowing. RCA is dominant with proximal 30 to 40% narrowing and mid 25% narrowing. Normal left ventricular size and function.  Assessment and Plan:  1.  Moderate CAD with  plan to continue medical therapy in the absence of accelerating angina.  She remains on aspirin, Toprol-XL, Imdur, and Crestor.  Most recent LDL 57.  Continue with regular walking plan for exercise.  2.  Prediabetes, following with Dr. Durene Cal.  She is currently on Glucophage.  Recent hemoglobin A1c 6.2%.  She would be a reasonable candidate for SGLT2 inhibitor.  I asked her to discuss this with her PCP, I wonder if she might be able to come off metformin in that case as well.  Medication Adjustments/Labs and Tests Ordered: Current medicines are reviewed at length with the patient today.  Concerns regarding medicines are outlined above.   Tests Ordered: No orders of the defined types were placed in this encounter.   Medication Changes: No orders of the defined types were placed in this encounter.   Disposition:  Follow up  6 months.  Signed, Jonelle Sidle, MD, [facility] 10/08/2022 10:06 AM    [facility] [address] [place], [place] [address] Phone: [phone]; Fax: [phone]

## 2022-10-08 ENCOUNTER — Ambulatory Visit: Payer: Medicare PPO | Attending: Cardiology | Admitting: Cardiology

## 2022-10-08 ENCOUNTER — Encounter: Payer: Self-pay | Admitting: Cardiology

## 2022-10-08 VITALS — BP 138/76 | HR 71 | Ht 66.0 in | Wt 176.0 lb

## 2022-10-08 DIAGNOSIS — I25119 Atherosclerotic heart disease of native coronary artery with unspecified angina pectoris: Secondary | ICD-10-CM | POA: Diagnosis not present

## 2022-10-08 DIAGNOSIS — R7303 Prediabetes: Secondary | ICD-10-CM | POA: Diagnosis not present

## 2022-10-08 NOTE — Patient Instructions (Signed)
Medication Instructions:  Your physician recommends that you continue on your current medications as directed. Please refer to the Current Medication list given to you today.   Labwork: None today  Testing/Procedures: None today  Follow-Up: 6 months  Any Other Special Instructions Will Be Listed Below (If Applicable).  If you need a refill on your cardiac medications before your next appointment, please call your pharmacy.  

## 2022-10-09 ENCOUNTER — Ambulatory Visit (INDEPENDENT_AMBULATORY_CARE_PROVIDER_SITE_OTHER): Payer: Medicare PPO

## 2022-10-09 DIAGNOSIS — Z0000 Encounter for general adult medical examination without abnormal findings: Secondary | ICD-10-CM | POA: Diagnosis not present

## 2022-10-09 NOTE — Progress Notes (Signed)
I connected with  [name] on 10/09/22 by a audio enabled telemedicine application and verified that I am speaking with the correct person using two identifiers.  Patient Location: Home  Provider Location: Office/Clinic  I discussed the limitations of evaluation and management by telemedicine. The patient expressed understanding and agreed to proceed.  Subjective:   Angela [name] is a 77 y.o. female who presents for Medicare Annual (Subsequent) preventive examination.  Review of Systems     Cardiac Risk Factors include: advanced age (>26mn, >>93women);hypertension;dyslipidemia     Objective:    There were no vitals filed for this visit. There is no height or weight on file to calculate BMI.     10/09/2022   10:06 AM 09/27/2022   11:46 AM 09/22/2021   10:26 AM 09/22/2021   10:22 AM 09/20/2021    2:16 PM 09/16/2020   10:36 AM 09/15/2020    1:10 PM  Advanced Directives  Does Patient Have a Medical Advance Directive? Yes No Yes Yes Yes Yes Yes  Type of [place]amed[place]of [place]  Healthcare Power of Attorney  Living will;Healthcare Power of [place]  Does patient want to make changes to medical advance directive? No - Patient declined    No - Patient declined  No - Patient declined  Copy of [place] Chart? Yes - validated most recent copy scanned in chart (See row information)     No - copy requested No - copy requested  Would patient like information on creating a medical advance directive? No - Patient declined No - Patient declined   No - Patient declined      Current Medications (verified) Outpatient Encounter Medications as of 10/09/2022  Medication Sig   acetaminophen (TYLENOL) 500 MG tablet acetaminophen 500 mg tablet  Take 2 tablets every 6 hours by oral route as needed.   aspirin 81 MG tablet Take 81 mg by mouth every evening.    Bromfenac Sodium 0.09 % SOLN    Calcium Carbonate-Vitamin D 600-400 MG-UNIT tablet Take 2 tablets by mouth every evening.    Cholecalciferol (VITAMIN D3) 50 MCG (2000 UT) capsule Take 2,000 Units by mouth every evening.    Coenzyme Q10 (COQ-10) 100 MG CAPS Take 100 mg by mouth daily.    Cyanocobalamin (VITAMIN B 12 PO) Take by mouth.   fish oil-omega-3 fatty acids 1000 MG capsule Take 1 g by mouth every evening.    hydrochlorothiazide (HYDRODIURIL) 25 MG tablet Take 1 tablet (25 mg total) by mouth daily.   isosorbide mononitrate (IMDUR) 30 MG 24 hr tablet TAKE 1 TABLET(30 MG) BY MOUTH TWICE DAILY   metFORMIN (GLUCOPHAGE) 500 MG tablet TAKE 1 TABLET(500 MG) BY MOUTH TWICE DAILY WITH A MEAL   metoprolol succinate (TOPROL-XL) 50 MG 24 hr tablet Take with or immediately following a meal.   Multiple Vitamin (MULTIVITAMIN) tablet Take 1 tablet by mouth every evening.   nitroGLYCERIN (NITROSTAT) 0.4 MG SL tablet ONE TABLET UNDER TONGUE AS NEEDED FOR CHEST PAIN   potassium chloride SA (KLOR-CON M) 20 MEQ tablet TAKE 1 TABLET BY MOUTH EVERY DAY   rosuvastatin (CRESTOR) 40 MG tablet TAKE 1 TABLET(40 MG) BY MOUTH DAILY   vitamin C (ASCORBIC ACID) 500 MG tablet Take 500 mg by mouth every evening.    No facility-administered encounter medications on file as of 10/09/2022.    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 NECK TX=MOHS   BCC (basal cell carcinoma of skin) 08/26/2018   RIGHT FRONT SHOULDER TX WITH BX   BCC (basal cell carcinoma) 08/31/1994   RIGHT SUPER CLAVICLE MEDIAL TX CX3 5FU   Breast cancer ([place]) 2009   Left Breast Cancer   CAD (coronary artery disease)    Cataract    Diabetes mellitus without complication ([place])    Essential hypertension    Hyperlipidemia    Macular retinal cyst of right eye    Nuclear sclerotic cataract of left eye 03/08/2020   The nature of cataract was discussed with the patient as well as the elective nature of surgery. The patient was reassured that surgery at a later date does not put the patient at risk for a worse outcome. It was emphasized that the need for surgery is dictated by the patient's quality of life as influenced by the cataract. Patient was instructed to maintain close follow up with their general eye    OSA (obstructive sleep apnea)    CPAP   Osteopenia    Osteopenia    Personal history of radiation therapy    Sleep apnea    Past Surgical History:  Procedure Laterality Date   BREAST EXCISIONAL BIOPSY Right    BREAST LUMPECTOMY Left 06/2008   CATARACT EXTRACTION  2012   COLONOSCOPY     Eyelid surgery Bilateral    INTRAVASCULAR PRESSURE WIRE/FFR STUDY N/A 06/01/2019   Procedure: INTRAVASCULAR PRESSURE WIRE/FFR STUDY;  Surgeon: Belva Crome, MD;  Location: [facility];  Service: Cardiovascular;  Laterality: N/A;   LEFT HEART CATH AND CORONARY ANGIOGRAPHY N/A 06/01/2019   Procedure: LEFT HEART CATH AND CORONARY ANGIOGRAPHY;  Surgeon: Belva Crome, MD;  Location: [facility];  Service: Cardiovascular;  Laterality: N/A;   MASS EXCISION  03/2008   Appendix   MOHS SURGERY     POLYPECTOMY     SKIN CANCER EXCISION     TONSILLECTOMY     Family History  Problem Relation Age of Onset   Heart attack Mother    Hypertension  Mother    Heart disease Father    Heart attack Father    Hypertension Father    Colon cancer Neg Hx    Stroke Neg Hx    Esophageal cancer Neg Hx    Rectal cancer Neg Hx    Stomach cancer Neg Hx    Colon polyps Neg Hx    Crohn's disease Neg Hx    Social History   Socioeconomic History   Marital status: Married    Spouse name: Not on file   Number of children: 3   Years of  education: Not on file   Highest education level: Not on file  Occupational History   Occupation: retired    Fish farm manager: RETIRED    Comment: works 1 day a week  Tobacco Use   Smoking status: Never    Passive exposure: Never   Smokeless tobacco: Never  Vaping Use   Vaping Use: Never used  Substance and Sexual Activity   Alcohol use: Yes    Alcohol/week: 3.0 standard drinks of alcohol    Types: 3 Glasses of wine per week    Comment: one glass a week   Drug use: No   Sexual activity: Not Currently  Other Topics Concern   Not on file  Social History Narrative   Married. 2 daughters. 4 grandkids (teenagers in [place] and then two 3 and under in 2021)      Retired from teaching special ed mostly [place]: travel, time with friends, eating out, lots of grandkids time in [place] Strain: [place]  (10/09/2022)   Overall Financial Resource Strain (CARDIA)    Difficulty of Paying Living Expenses: Not hard at all  Food Insecurity: No Food Insecurity (10/09/2022)   Hunger Vital Sign    Worried About Running Out of Food in the Last Year: Never true    [place] in the Last Year: Never true  Transportation Needs: No Transportation Needs (10/09/2022)   PRAPARE - Hydrologist (Medical): No    Lack of Transportation (Non-Medical): No  Physical Activity: Insufficiently Active (10/09/2022)   Exercise Vital Sign    Days of Exercise per Week: 4 days    Minutes of Exercise per Session: 30 min  Stress: No Stress Concern  Present (10/09/2022)   [place]    Feeling of Stress : Not at all  Social Connections: [place] (10/09/2022)   Social Connection and Isolation Panel [NHANES]    Frequency of Communication with Friends and Family: More than three times a week    Frequency of Social Gatherings with Friends and Family: More than three times a week    Attends Religious Services: More than 4 times per year    Active Member of [employer] or Organizations: Yes    Attends Archivist Meetings: 1 to 4 times per year    Marital Status: Married    Tobacco Counseling Counseling given: Not Answered   Clinical Intake:  Pre-visit preparation completed: Yes  Pain : No/denies pain     BMI - recorded: 28.41 Nutritional Status: BMI 25 -29 Overweight Nutritional Risks: None Diabetes: No  How often do you need to have someone help you when you read instructions, pamphlets, or other written materials from your doctor or pharmacy?: 1 - Never  Diabetic?no  Interpreter Needed?: No  Information entered by :: Charlott Rakes, LPN   Activities of Daily Living    10/09/2022   10:07 AM  In your present state of health, do you have any difficulty performing the following activities:  Hearing? 1  Comment slight loss  Vision? 0  Difficulty concentrating or making decisions? 0  Walking or climbing stairs? 0  Dressing or bathing? 0  Doing errands, shopping? 0  Preparing Food and eating ? N  Using the Toilet? N  In the past six months, have you accidently leaked urine? N  Do you have problems with loss  of bowel control? N  Managing your Medications? N  Managing your Finances? N  Housekeeping or managing your Housekeeping? N    Patient Care Team: Marin Olp, MD as PCP - General (Family Medicine) Satira Sark, MD as PCP - Cardiology (Cardiology) Jovita Kussmaul, MD as Consulting Physician (General Surgery) Thea Silversmith, MD as Consulting Physician (Radiation Oncology) Inda Castle, MD (Inactive) as Consulting Physician (Gastroenterology) Danice Goltz, MD as Consulting Physician (Ophthalmology) Zadie Rhine Clent Demark, MD as Consulting Physician (Ophthalmology) Chesley Mires, MD as Consulting Physician (Pulmonary Disease) Derek Jack, MD as Consulting Physician (Hematology) Gardiner Barefoot, DPM as Consulting Physician (Podiatry) [name], MD (Inactive) as Consulting Physician (Dermatology)  Indicate any recent Medical Services you may have received from other than [facility] providers in the past year (date may be approximate).     Assessment:   This is a routine wellness examination for Haunani.  Hearing/Vision screen Hearing Screening - Comments:: Pt stated hoh  Vision Screening - Comments:: Pt follows up with Dr Satira Sark for annual eye exams   Dietary issues and exercise activities discussed: Current Exercise Habits: Home exercise routine, Type of exercise: walking, Time (Minutes): 30, Frequency (Times/Week): 4, Weekly Exercise (Minutes/Week): 120   Goals Addressed             This Visit's Progress    Patient Stated       Lose some weight        Depression Screen    10/09/2022   10:05 AM 10/02/2022    9:06 AM 09/22/2021   10:21 AM 01/31/2021   10:36 AM 09/16/2020   10:32 AM 01/26/2020    9:34 AM 07/24/2019    9:39 AM  PHQ 2/9 Scores  PHQ - 2 Score 0 0 0 0 0 0 0  PHQ- 9 Score 0 0  0       Fall Risk    10/09/2022   10:07 AM 10/02/2022    9:06 AM 09/22/2021   10:30 AM 01/31/2021   10:36 AM 09/16/2020   10:39 AM  Fall Risk   Falls in the past year? 0 0 0 0 0  Number falls in past yr: 0 0 0 0 0  Injury with Fall? 0 0 0 0 0  Risk for fall due to : Impaired vision No Fall Risks Impaired vision  Impaired vision  Follow up Falls prevention discussed Falls evaluation completed Falls prevention discussed  Falls prevention discussed    FALL RISK PREVENTION PERTAINING TO THE  HOME:  Any stairs in or around the home? Yes  If so, are there any without handrails? No  Home free of loose throw rugs in walkways, pet beds, electrical cords, etc? Yes  Adequate lighting in your home to reduce risk of falls? Yes   ASSISTIVE DEVICES UTILIZED TO PREVENT FALLS:  Life alert? No  Use of a cane, walker or w/c? No  Grab bars in the bathroom? Yes  Shower chair or bench in shower? No  Elevated toilet seat or a handicapped toilet? No   TIMED UP AND GO:  Was the test performed? No .   Cognitive Function:        10/09/2022   10:08 AM 09/22/2021   10:32 AM 09/16/2020   10:41 AM 07/24/2019   10:45 AM  6CIT Screen  What Year? 0 points 0 points 0 points 0 points  What month? 0 points 0 points 0 points 0 points  What time? 0 points 0 points  0 points  Count back from 20 0 points 0 points 0 points 0 points  Months in reverse 0 points 0 points 0 points 0 points  Repeat phrase 0 points 0 points 0 points 0 points  Total Score 0 points 0 points  0 points    Immunizations Immunization History  Administered Date(s) Administered   Fluad Quad(high Dose 65+) 06/12/2019, 05/05/2020, 05/04/2022   Hep A / Hep B 03/16/2013, 10/06/2013   Hepatitis B, adult 04/17/2013   Influenza Split 06/09/2012   Influenza Whole 05/22/2011   Influenza,inj,Quad PF,6+ Mos 05/27/2013   Influenza-Unspecified 06/10/2014, 05/24/2015, 04/24/2016, 04/26/2017, 05/11/2018, 06/06/2021   Moderna SARS-COV2 Booster Vaccination 12/08/2020, 05/30/2021   Moderna Sars-Covid-2 Vaccination 10/13/2019, 11/12/2019, 07/05/2020, 01/31/2022   PNEUMOCOCCAL CONJUGATE-20 10/02/2022   Pfizer Covid-19 Vaccine Bivalent Booster 48yr & up 06/06/2022   Pneumococcal Conjugate-13 11/12/2014   Pneumococcal Polysaccharide-23 08/24/2008, 07/17/2013   Respiratory Syncytial Virus Vaccine,Recomb Aduvanted(Arexvy) 05/04/2022   Td 04/19/2010   Tdap 04/05/2016   Zoster Recombinat (Shingrix) 07/01/2017, 09/20/2017   Zoster, Live  11/17/2008    TDAP status: Up to date  Flu Vaccine status: Up to date  Pneumococcal vaccine status: Up to date  Covid-19 vaccine status: Completed vaccines  Qualifies for Shingles Vaccine? Yes   Zostavax completed Yes   Shingrix Completed?: Yes  Screening Tests Health Maintenance  Topic Date Due   COVID-19 Vaccine (6 - 2023-24 season) 10/18/2022 (Originally 08/01/2022)   MAMMOGRAM  09/20/2023   Medicare Annual Wellness (AWV)  10/10/2023   DTaP/Tdap/Td (3 - Td or Tdap) 04/05/2026   Pneumonia Vaccine 77 Years old  Completed   INFLUENZA VACCINE  Completed   DEXA SCAN  Completed   Hepatitis C Screening  Completed   Zoster Vaccines- Shingrix  Completed   HPV VACCINES  Aged Out   COLONOSCOPY (Pts [phone] coverage will need to be confirmed)  Discontinued    Health Maintenance  There are no preventive care reminders to display for this patient.   Colorectal cancer screening: No longer required.   Mammogram status: Completed 09/19/22. Repeat every year  Bone Density status: Completed 08/28/21. Results reflect: Bone density results: OSTEOPENIA. Repeat every 2 years.   Additional Screening:  Hepatitis C Screening: Completed 02/21/16  Vision Screening: Recommended annual ophthalmology exams for early detection of glaucoma and other disorders of the eye. Is the patient up to date with their annual eye exam?  Yes  Who is the provider or what is the name of the office in which the patient attends annual eye exams? Dr TaSatira SarkIf pt is not established with a provider, would they like to be referred to a provider to establish care? No .   Dental Screening: Recommended annual dental exams for proper oral hygiene  Community Resource Referral / Chronic Care Management: CRR required this visit?  No   CCM required this visit?  No      Plan:     I have personally reviewed and noted the following in the patient's chart:   Medical and social history Use of alcohol,  tobacco or illicit drugs  Current medications and supplements including opioid prescriptions. Patient is not currently taking opioid prescriptions. Functional ability and status Nutritional status Physical activity Advanced directives List of other physicians Hospitalizations, surgeries, and ER visits in previous 12 months Vitals Screenings to include cognitive, depression, and falls Referrals and appointments  In addition, I have reviewed and discussed with patient certain preventive protocols, quality metrics, and best practice recommendations. A written personalized  care plan for preventive services as well as general preventive health recommendations were provided to patient.     Willette Brace, LPN   [id number]   Nurse Notes: none

## 2022-10-09 NOTE — Patient Instructions (Signed)
Angela [name] , Thank you for taking time to come for your Medicare Wellness Visit. I appreciate your ongoing commitment to your health goals. Please review the following plan we discussed and let me know if I can assist you in the future.   These are the goals we discussed:  Goals      Patient Stated     Lose weight      Patient Stated     Lose weight at least 5lbs      Patient Stated     Lose some weight         This is a list of the screening recommended for you and due dates:  Health Maintenance  Topic Date Due   COVID-19 Vaccine (6 - 2023-24 season) 10/18/2022*   Mammogram  09/20/2023   Medicare Annual Wellness Visit  10/10/2023   DTaP/Tdap/Td vaccine (3 - Td or Tdap) 04/05/2026   Pneumonia Vaccine  Completed   Flu Shot  Completed   DEXA scan (bone density measurement)  Completed   Hepatitis C Screening: USPSTF Recommendation to screen - Ages 51-79 yo.  Completed   Zoster (Shingles) Vaccine  Completed   HPV Vaccine  Aged Out   Colon Cancer Screening  Discontinued  *Topic was postponed. The date shown is not the original due date.    Advanced directives: copies in chart   Conditions/risks identified: lose some weight   Next appointment: Follow up in one year for your annual wellness visit    Preventive Care 65 Years and Older, Female Preventive care refers to lifestyle choices and visits with your health care provider that can promote health and wellness. What does preventive care include? A yearly physical exam. This is also called an annual well check. Dental exams once or twice a year. Routine eye exams. Ask your health care provider how often you should have your eyes checked. Personal lifestyle choices, including: Daily care of your teeth and gums. Regular physical activity. Eating a healthy diet. Avoiding tobacco and drug use. Limiting alcohol use. Practicing safe sex. Taking low-dose aspirin every day. Taking vitamin and mineral supplements as  recommended by your health care provider. What happens during an annual well check? The services and screenings done by your health care provider during your annual well check will depend on your age, overall health, lifestyle risk factors, and family history of disease. Counseling  Your health care provider may ask you questions about your: Alcohol use. Tobacco use. Drug use. Emotional well-being. Home and relationship well-being. Sexual activity. Eating habits. History of falls. Memory and ability to understand (cognition). Work and work Statistician. Reproductive health. Screening  You may have the following tests or measurements: Height, weight, and BMI. Blood pressure. Lipid and cholesterol levels. These may be checked every 5 years, or more frequently if you are over 4 years old. Skin check. Lung cancer screening. You may have this screening every year starting at age 103 if you have a 30-pack-year history of smoking and currently smoke or have quit within the past 15 years. Fecal occult blood test (FOBT) of the stool. You may have this test every year starting at age 71. Flexible sigmoidoscopy or colonoscopy. You may have a sigmoidoscopy every 5 years or a colonoscopy every 10 years starting at age 58. Hepatitis C blood test. Hepatitis B blood test. Sexually transmitted disease (STD) testing. Diabetes screening. This is done by checking your blood sugar (glucose) after you have not eaten for a while (fasting). You may have  this done every 1-3 years. Bone density scan. This is done to screen for osteoporosis. You may have this done starting at age 19. Mammogram. This may be done every 1-2 years. Talk to your health care provider about how often you should have regular mammograms. Talk with your health care provider about your test results, treatment options, and if necessary, the need for more tests. Vaccines  Your health care provider may recommend certain vaccines, such  as: Influenza vaccine. This is recommended every year. Tetanus, diphtheria, and acellular pertussis (Tdap, Td) vaccine. You may need a Td booster every 10 years. Zoster vaccine. You may need this after age 61. Pneumococcal 13-valent conjugate (PCV13) vaccine. One dose is recommended after age 16. Pneumococcal polysaccharide (PPSV23) vaccine. One dose is recommended after age 8. Talk to your health care provider about which screenings and vaccines you need and how often you need them. This information is not intended to replace advice given to you by your health care provider. Make sure you discuss any questions you have with your health care provider. Document Released: 09/23/2015 Document Revised: 05/16/2016 Document Reviewed: 06/28/2015 Elsevier Interactive Patient Education  2017 [place] Prevention in the Home Falls can cause injuries. They can happen to people of all ages. There are many things you can do to make your home safe and to help prevent falls. What can I do on the outside of my home? Regularly fix the edges of walkways and driveways and fix any cracks. Remove anything that might make you trip as you walk through a door, such as a raised step or threshold. Trim any bushes or trees on the path to your home. Use bright outdoor lighting. Clear any walking paths of anything that might make someone trip, such as rocks or tools. Regularly check to see if handrails are loose or broken. Make sure that both sides of any steps have handrails. Any raised decks and porches should have guardrails on the edges. Have any leaves, snow, or ice cleared regularly. Use sand or salt on walking paths during winter. Clean up any spills in your garage right away. This includes oil or grease spills. What can I do in the bathroom? Use night lights. Install grab bars by the toilet and in the tub and shower. Do not use towel bars as grab bars. Use non-skid mats or decals in the tub or  shower. If you need to sit down in the shower, use a plastic, non-slip stool. Keep the floor dry. Clean up any water that spills on the floor as soon as it happens. Remove soap buildup in the tub or shower regularly. Attach bath mats securely with double-sided non-slip rug tape. Do not have throw rugs and other things on the floor that can make you trip. What can I do in the bedroom? Use night lights. Make sure that you have a light by your bed that is easy to reach. Do not use any sheets or blankets that are too big for your bed. They should not hang down onto the floor. Have a firm chair that has side arms. You can use this for support while you get dressed. Do not have throw rugs and other things on the floor that can make you trip. What can I do in the kitchen? Clean up any spills right away. Avoid walking on wet floors. Keep items that you use a lot in easy-to-reach places. If you need to reach something above you, use a strong step stool  that has a grab bar. Keep electrical cords out of the way. Do not use floor polish or wax that makes floors slippery. If you must use wax, use non-skid floor wax. Do not have throw rugs and other things on the floor that can make you trip. What can I do with my stairs? Do not leave any items on the stairs. Make sure that there are handrails on both sides of the stairs and use them. Fix handrails that are broken or loose. Make sure that handrails are as long as the stairways. Check any carpeting to make sure that it is firmly attached to the stairs. Fix any carpet that is loose or worn. Avoid having throw rugs at the top or bottom of the stairs. If you do have throw rugs, attach them to the floor with carpet tape. Make sure that you have a light switch at the top of the stairs and the bottom of the stairs. If you do not have them, ask someone to add them for you. What else can I do to help prevent falls? Wear shoes that: Do not have high heels. Have  rubber bottoms. Are comfortable and fit you well. Are closed at the toe. Do not wear sandals. If you use a stepladder: Make sure that it is fully opened. Do not climb a closed stepladder. Make sure that both sides of the stepladder are locked into place. Ask someone to hold it for you, if possible. Clearly mark and make sure that you can see: Any grab bars or handrails. First and last steps. Where the edge of each step is. Use tools that help you move around (mobility aids) if they are needed. These include: Canes. Walkers. Scooters. Crutches. Turn on the lights when you go into a dark area. Replace any light bulbs as soon as they burn out. Set up your furniture so you have a clear path. Avoid moving your furniture around. If any of your floors are uneven, fix them. If there are any pets around you, be aware of where they are. Review your medicines with your doctor. Some medicines can make you feel dizzy. This can increase your chance of falling. Ask your doctor what other things that you can do to help prevent falls. This information is not intended to replace advice given to you by your health care provider. Make sure you discuss any questions you have with your health care provider. Document Released: 06/23/2009 Document Revised: 02/02/2016 Document Reviewed: 10/01/2014 Elsevier Interactive Patient Education  2017 [employer].

## 2022-10-18 ENCOUNTER — Encounter: Payer: Self-pay | Admitting: Podiatry

## 2022-10-18 ENCOUNTER — Ambulatory Visit: Payer: Medicare PPO | Admitting: Podiatry

## 2022-10-18 DIAGNOSIS — L600 Ingrowing nail: Secondary | ICD-10-CM

## 2022-10-18 NOTE — Patient Instructions (Signed)

## 2022-10-19 NOTE — Progress Notes (Signed)
Subjective:   Patient ID: Angela [name], female   DOB: 77 y.o.   MRN: [id number]   HPI Patient presents with ingrown toenail right big toe lateral border that is painful when pressed and make shoe gear difficult.  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

## 2022-11-05 ENCOUNTER — Ambulatory Visit: Payer: Medicare PPO | Admitting: Dermatology

## 2022-11-12 ENCOUNTER — Encounter: Payer: Self-pay | Admitting: Family Medicine

## 2022-11-13 ENCOUNTER — Encounter (INDEPENDENT_AMBULATORY_CARE_PROVIDER_SITE_OTHER): Payer: Medicare PPO | Admitting: Ophthalmology

## 2022-11-13 DIAGNOSIS — H26492 Other secondary cataract, left eye: Secondary | ICD-10-CM | POA: Diagnosis not present

## 2022-11-13 DIAGNOSIS — H35073 Retinal telangiectasis, bilateral: Secondary | ICD-10-CM | POA: Diagnosis not present

## 2022-11-13 DIAGNOSIS — H4321 Crystalline deposits in vitreous body, right eye: Secondary | ICD-10-CM | POA: Diagnosis not present

## 2022-11-26 ENCOUNTER — Encounter: Payer: Self-pay | Admitting: Family Medicine

## 2022-12-21 ENCOUNTER — Ambulatory Visit: Payer: Medicare PPO | Admitting: Podiatry

## 2022-12-26 ENCOUNTER — Ambulatory Visit: Payer: Medicare PPO | Admitting: Podiatry

## 2023-01-01 DIAGNOSIS — G4733 Obstructive sleep apnea (adult) (pediatric): Secondary | ICD-10-CM | POA: Diagnosis not present

## 2023-01-04 ENCOUNTER — Other Ambulatory Visit: Payer: Self-pay | Admitting: Cardiology

## 2023-01-08 ENCOUNTER — Ambulatory Visit: Payer: Medicare PPO | Admitting: Podiatry

## 2023-01-08 DIAGNOSIS — L600 Ingrowing nail: Secondary | ICD-10-CM

## 2023-01-08 DIAGNOSIS — B351 Tinea unguium: Secondary | ICD-10-CM | POA: Diagnosis not present

## 2023-01-08 DIAGNOSIS — M79609 Pain in unspecified limb: Secondary | ICD-10-CM

## 2023-01-08 NOTE — Progress Notes (Signed)
Chief Complaint  Patient presents with   Nail Problem    Patient came in today for left foot hallux nail split and is coming off, left hallux is having some pain lateral border,     SUBJECTIVE Patient presents to office today complaining of elongated, thickened nails that cause pain while ambulating in shoes.  Patient is unable to trim their own nails. Patient is here for further evaluation and treatment.  She also notes that the left great toenail, medial border, sustained a crack near the tip, but it has now deepened almost to the cuticle level.  She has pain.  Denies drainage, redness, or purulence.  She underwent a partial nail avulsion on 10/18/22 of the right hallux nail lateral border and has some thickened skin in the area.   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 NECK TX=MOHS   BCC (basal cell carcinoma of skin) 08/26/2018   RIGHT FRONT SHOULDER TX WITH BX   BCC (basal cell carcinoma) 08/31/1994   RIGHT SUPER CLAVICLE MEDIAL TX CX3 5FU   Breast cancer (HCC) 2009   Left Breast Cancer   CAD (coronary artery disease)    Cataract    Diabetes mellitus without complication (HCC)    Essential hypertension    Hyperlipidemia    Macular retinal cyst of right eye    Nuclear sclerotic cataract of left eye 03/08/2020   The nature of cataract was  discussed with the patient as well as the elective nature of surgery. The patient was reassured that surgery at a later date does not put the patient at risk for a worse outcome. It was emphasized that the need for surgery is dictated by the patient's quality of life as influenced by the cataract. Patient was instructed to maintain close follow up with their general eye    OSA (obstructive sleep apnea)    CPAP   Osteopenia    Osteopenia    Personal history of radiation therapy    Sleep apnea     Allergies  Allergen Reactions   Lisinopril Cough   Statins Other (See Comments)    Myalgia with lipitor- unproven She is able to tolerate simvastatin     OBJECTIVE General Patient is awake, alert, and oriented x 3 and in no acute distress. Derm Skin is dry and supple bilateral. Negative open lesions or macerations. Remaining integument unremarkable. Nails are tender, long, thickened and dystrophic with subungual debris, consistent with onychomycosis, 1-5 bilateral. No signs of infection noted.  The left hallux has a longitudinal split extending entire length of nail along the medial border.  NO erythema or drainage noted.  Pain on palpation of loose nail in this area. Some thickened escharotic tissue noted  on lateral right hallux border at former partial nail avulsion site.  Vasc  DP and PT pedal pulses palpable bilaterally. Temperature gradient within normal limits.  Neuro Epicritic and protective threshold sensation grossly intact bilaterally.  Musculoskeletal Exam No symptomatic pedal deformities noted bilateral. Muscular strength within normal limits.  ASSESSMENT 1.  Pain due to onychomycosis of toenails both 2.  Injury to left hallux medial nail border with pain.  PLAN OF CARE 1. Patient evaluated today.  2. Instructed to maintain good pedal hygiene and foot care.  3. Mechanical debridement of nails 1-5 bilaterally performed using a nail nipper. Filed with dremel without incident.  4.   With patient consent, the left hallux was anesthetized with 1% lidocaine plain for total of 3cc's administered.  After a sterile skin prep, the left hallux medial nail border was cut back proximal to the eponychium and removed.  The area was inspected for any spicules or nail bed damage, which was not found.  Once the partial nail was avulsed, the area was dressed with abx ointment and a DSD applied to left hallux.  Patient given oral instructions on daily cleansing and healing of the area.  F/u if any signs of infection develop. 5.  The thickened skin / eschar on right hallux lateral border was shaved/removed uneventfully. 6.  F/U prn.   Felecia Shelling, DPM [facility]  Dr. Felecia Shelling, DPM    [address] [place], [place] [address]                Office [phone]  Fax [phone]

## 2023-02-08 DIAGNOSIS — H52203 Unspecified astigmatism, bilateral: Secondary | ICD-10-CM | POA: Diagnosis not present

## 2023-02-08 DIAGNOSIS — E119 Type 2 diabetes mellitus without complications: Secondary | ICD-10-CM | POA: Diagnosis not present

## 2023-02-08 DIAGNOSIS — H26493 Other secondary cataract, bilateral: Secondary | ICD-10-CM | POA: Diagnosis not present

## 2023-02-08 DIAGNOSIS — H524 Presbyopia: Secondary | ICD-10-CM | POA: Diagnosis not present

## 2023-02-08 LAB — HM DIABETES EYE EXAM

## 2023-02-26 DIAGNOSIS — D1801 Hemangioma of skin and subcutaneous tissue: Secondary | ICD-10-CM | POA: Diagnosis not present

## 2023-02-26 DIAGNOSIS — L820 Inflamed seborrheic keratosis: Secondary | ICD-10-CM | POA: Diagnosis not present

## 2023-02-26 DIAGNOSIS — L814 Other melanin hyperpigmentation: Secondary | ICD-10-CM | POA: Diagnosis not present

## 2023-02-26 DIAGNOSIS — L738 Other specified follicular disorders: Secondary | ICD-10-CM | POA: Diagnosis not present

## 2023-02-26 DIAGNOSIS — L570 Actinic keratosis: Secondary | ICD-10-CM | POA: Diagnosis not present

## 2023-02-26 DIAGNOSIS — C44519 Basal cell carcinoma of skin of other part of trunk: Secondary | ICD-10-CM | POA: Diagnosis not present

## 2023-02-26 DIAGNOSIS — L821 Other seborrheic keratosis: Secondary | ICD-10-CM | POA: Diagnosis not present

## 2023-03-05 DIAGNOSIS — H26491 Other secondary cataract, right eye: Secondary | ICD-10-CM | POA: Diagnosis not present

## 2023-03-20 ENCOUNTER — Ambulatory Visit: Payer: Medicare PPO | Admitting: Podiatry

## 2023-03-20 ENCOUNTER — Encounter: Payer: Self-pay | Admitting: Podiatry

## 2023-03-20 DIAGNOSIS — G629 Polyneuropathy, unspecified: Secondary | ICD-10-CM | POA: Diagnosis not present

## 2023-03-20 DIAGNOSIS — M79609 Pain in unspecified limb: Secondary | ICD-10-CM

## 2023-03-20 DIAGNOSIS — B351 Tinea unguium: Secondary | ICD-10-CM | POA: Diagnosis not present

## 2023-03-20 NOTE — Progress Notes (Signed)
Subjective:  Patient ID: Angela [name], female    DOB: 1946/03/04,   MRN: [id number]  No chief complaint on file.   77 y.o. female presents for concern of thickened elongated and painful nails that are difficult to trim. Requesting to have them trimmed today. Relates burning and tingling in their feet. Patient is pre-diabetic and last A1c was  Lab Results  Component Value Date   HGBA1C 6.2 10/02/2022   .   PCP:  Shelva Majestic, MD    . Denies any other pedal complaints. Denies n/v/f/c.   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 NECK TX=MOHS   BCC (basal cell carcinoma of skin) 08/26/2018   RIGHT FRONT SHOULDER TX WITH BX   BCC (basal cell carcinoma) 08/31/1994   RIGHT SUPER CLAVICLE MEDIAL TX CX3 5FU   Breast cancer (HCC) 2009   Left Breast Cancer   CAD (coronary artery disease)    Cataract    Diabetes mellitus without complication (HCC)    Essential hypertension    Hyperlipidemia    Macular retinal cyst of right eye    Nuclear sclerotic cataract of left eye 03/08/2020   The nature of cataract was discussed with the patient as well as the elective nature of surgery. The patient was reassured that surgery at a later date does not put the patient at risk for a worse outcome. It was emphasized that the need for surgery is dictated by the patient's  quality of life as influenced by the cataract. Patient was instructed to maintain close follow up with their general eye    OSA (obstructive sleep apnea)    CPAP   Osteopenia    Osteopenia    Personal history of radiation therapy    Sleep apnea     Objective:  Physical Exam: Vascular: DP/PT pulses 2/4 bilateral. CFT <3 seconds. Absent hair growth on digits. Edema noted to bilateral lower extremities. Xerosis noted bilaterally.  Skin. No lacerations or abrasions bilateral feet. Nails 1-5 bilateral  are thickened discolored and elongated with subungual debris.  Musculoskeletal: MMT 5/5 bilateral lower extremities in DF, PF, Inversion and Eversion. Deceased ROM in DF of ankle joint.  Neurological: Sensation intact to light touch. Protective sensation intact bilateral.    Assessment:   1. Pain due to onychomycosis of nail   2. Neuropathy      Plan:  Patient was evaluated and treated and all questions answered. -Discussed and educated patient on  foot care, especially with  regards to the vascular, neurological and musculoskeletal systems.  -Stressed the importance of good glycemic control and the detriment of not  controlling glucose levels in relation to the foot. -Discussed supportive shoes at all times and checking  feet regularly.  -Mechanically debrided all nails 1-5 bilateral using sterile nail nipper and filed with dremel without incident  -Answered all patient questions -Patient to return  in 3 months for at risk foot care -Patient advised to call the office if any problems or questions arise in the meantime.   Louann Sjogren, DPM

## 2023-03-26 DIAGNOSIS — H26491 Other secondary cataract, right eye: Secondary | ICD-10-CM | POA: Diagnosis not present

## 2023-04-10 ENCOUNTER — Encounter (INDEPENDENT_AMBULATORY_CARE_PROVIDER_SITE_OTHER): Payer: Self-pay

## 2023-04-15 DIAGNOSIS — C44519 Basal cell carcinoma of skin of other part of trunk: Secondary | ICD-10-CM | POA: Diagnosis not present

## 2023-04-15 DIAGNOSIS — L820 Inflamed seborrheic keratosis: Secondary | ICD-10-CM | POA: Diagnosis not present

## 2023-04-15 DIAGNOSIS — Z85828 Personal history of other malignant neoplasm of skin: Secondary | ICD-10-CM | POA: Diagnosis not present

## 2023-04-16 ENCOUNTER — Encounter (HOSPITAL_BASED_OUTPATIENT_CLINIC_OR_DEPARTMENT_OTHER): Payer: Self-pay | Admitting: Pulmonary Disease

## 2023-04-16 ENCOUNTER — Ambulatory Visit (HOSPITAL_BASED_OUTPATIENT_CLINIC_OR_DEPARTMENT_OTHER): Payer: Medicare PPO | Admitting: Pulmonary Disease

## 2023-04-16 ENCOUNTER — Ambulatory Visit: Payer: Medicare PPO | Admitting: Cardiology

## 2023-04-16 VITALS — BP 114/72 | HR 72 | Resp 15 | Ht 66.0 in | Wt 172.2 lb

## 2023-04-16 DIAGNOSIS — Z7189 Other specified counseling: Secondary | ICD-10-CM | POA: Diagnosis not present

## 2023-04-16 DIAGNOSIS — G4733 Obstructive sleep apnea (adult) (pediatric): Secondary | ICD-10-CM

## 2023-04-16 NOTE — Progress Notes (Signed)
[name]  Chief Complaint  Patient presents with   Follow-up    Pt needs a letter for c-pap  machine for travel ,     Constitutional:  BP 114/72   Pulse 72   Resp 15   Ht 5\' 6"  (1.676 m)   Wt 172 lb 3.2 oz (78.1 kg)   LMP  (LMP Unknown)   SpO2 96%   BMI 27.79 kg/m   Past Medical History:  OA, Breast cancer 2009, CAD, Cataract, HTN, HLD, Macular retinal cyst Rt eye, Osteopenia  Past Surgical History:  She  has a past surgical history that includes Tonsillectomy; Mass excision (03/2008); Cataract extraction (2012); Colonoscopy; Eyelid surgery (Bilateral); Skin cancer excision; LEFT HEART CATH AND CORONARY ANGIOGRAPHY (N/A, 06/01/2019); CORONARY PRESSURE/FFR STUDY (N/A, 06/01/2019); Breast lumpectomy (Left, 06/2008); Breast excisional biopsy (Right); Polypectomy; and Mohs surgery.  Brief Summary:  Angela [name] is a 77 y.o. female with obstructive sleep apnea.      Subjective:   She uses CPAP nightly.  No issues with mask fit or pressure.  Wakes up a few times during the night to use the bathroom.  Not using anything to help sleep or stay awake.  Sometimes naps in the afternoon.  She will be travelling to [place] later this Fall.  She wants to make sure she is up to date with COVID vaccine.  She did get a booster in June 2024, but wants to make sure she has the latest one before travelling.  Physical Exam:   Appearance - well kempt   ENMT - no sinus tenderness, no oral exudate, no LAN, Mallampati 3 airway, no stridor, high arched palate  Respiratory - equal breath sounds bilaterally, no wheezing or rales  CV - s1s2 regular rate and rhythm, no murmurs  Ext - no clubbing, no edema  Skin - no rashes  Psych - normal mood and affect   Sleep Tests:  HST 08/15/15 >> AHI 52.9 SaO2 low 65% Auto 04/14/20 to 04/13/21 >> used on 363 of 365 nights with average 8 hrs 54 min.  Average AHI 0.4 with median CPAP 12 and 95 th percentile  CPAP 14 cm H2O  Social History:  She  reports that she has never smoked. She has never been exposed to tobacco smoke. She has never used smokeless tobacco. She reports current alcohol use of about 3.0 standard drinks of alcohol per week. She reports that she does not use drugs.  Family History:  Her family history includes Heart attack in her father and mother; Heart disease in her father; Hypertension in her father and mother.     Assessment/Plan:   Obstructive sleep apnea. - she is compliant with CPAP and reports benefit from therapy - she uses [employer] for her DME - current CPAP ordered August 2022 - continue auto CPAP 5 to 15 cm H2O - letter written for her to travel with her CPAP machine  COVID 19 advice. - discussed new vaccine that will likely be coming out in the Fall - discussed methods to minimize risk of exposure if she travels  Time Spent Involved in Patient Care on Day of Examination:  25 minutes  Follow up:   Patient Instructions  Follow up in 1 year  Medication List:   Allergies as of 04/16/2023       Reactions   Lisinopril Cough   Statins Other (See Comments)   Myalgia with lipitor- unproven She is able to tolerate simvastatin  Medication List        Accurate as of April 16, 2023  9:24 AM. If you have any questions, ask your nurse or doctor.          acetaminophen 500 MG tablet Commonly known as: TYLENOL acetaminophen 500 mg tablet  Take 2 tablets every 6 hours by oral route as needed.   ascorbic acid 500 MG tablet Commonly known as: VITAMIN C Take 500 mg by mouth every evening.   aspirin 81 MG tablet Take 81 mg by mouth every evening.   Bromfenac Sodium 0.09 % Soln   Calcium Carbonate-Vitamin D 600-400 MG-UNIT tablet Take 2 tablets by mouth every evening.   CoQ-10 100 MG Caps Take 100 mg by mouth daily.   fish oil-omega-3 fatty acids 1000 MG capsule Take 1 g by mouth every evening.   hydrochlorothiazide 25 MG  tablet Commonly known as: HYDRODIURIL Take 1 tablet (25 mg total) by mouth daily.   isosorbide mononitrate 30 MG 24 hr tablet Commonly known as: IMDUR TAKE 1 TABLET(30 MG) BY MOUTH TWICE DAILY   metFORMIN 500 MG tablet Commonly known as: GLUCOPHAGE TAKE 1 TABLET(500 MG) BY MOUTH TWICE DAILY WITH A MEAL   metoprolol succinate 50 MG 24 hr tablet Commonly known as: TOPROL-XL Take with or immediately following a meal.   multivitamin tablet Take 1 tablet by mouth every evening.   nitroGLYCERIN 0.4 MG SL tablet Commonly known as: NITROSTAT ONE TABLET UNDER TONGUE AS NEEDED FOR CHEST PAIN   potassium chloride SA 20 MEQ tablet Commonly known as: KLOR-CON M TAKE 1 TABLET BY MOUTH EVERY DAY   rosuvastatin 40 MG tablet Commonly known as: CRESTOR TAKE 1 TABLET(40 MG) BY MOUTH DAILY   VITAMIN B 12 PO Take by mouth.   Vitamin D3 50 MCG (2000 UT) capsule Take 2,000 Units by mouth every evening.        Signature:  Coralyn Helling, MD [facility] Care Pager - [phone] 04/16/2023, 9:24 AM

## 2023-04-16 NOTE — Patient Instructions (Signed)
Follow up in 1 year.

## 2023-04-30 ENCOUNTER — Ambulatory Visit: Payer: Medicare PPO | Admitting: Family Medicine

## 2023-04-30 ENCOUNTER — Encounter: Payer: Self-pay | Admitting: Family Medicine

## 2023-04-30 VITALS — BP 130/80 | HR 56 | Temp 97.9°F | Ht 66.0 in | Wt 177.4 lb

## 2023-04-30 DIAGNOSIS — G629 Polyneuropathy, unspecified: Secondary | ICD-10-CM

## 2023-04-30 DIAGNOSIS — E538 Deficiency of other specified B group vitamins: Secondary | ICD-10-CM

## 2023-04-30 DIAGNOSIS — Z131 Encounter for screening for diabetes mellitus: Secondary | ICD-10-CM

## 2023-04-30 DIAGNOSIS — E785 Hyperlipidemia, unspecified: Secondary | ICD-10-CM | POA: Diagnosis not present

## 2023-04-30 DIAGNOSIS — I10 Essential (primary) hypertension: Secondary | ICD-10-CM

## 2023-04-30 DIAGNOSIS — R739 Hyperglycemia, unspecified: Secondary | ICD-10-CM | POA: Diagnosis not present

## 2023-04-30 DIAGNOSIS — I25119 Atherosclerotic heart disease of native coronary artery with unspecified angina pectoris: Secondary | ICD-10-CM

## 2023-04-30 LAB — CBC WITH DIFFERENTIAL/PLATELET
Basophils Absolute: 0 10*3/uL (ref 0.0–0.1)
Basophils Relative: 0.6 % (ref 0.0–3.0)
Eosinophils Absolute: 0.1 10*3/uL (ref 0.0–0.7)
Eosinophils Relative: 2.1 % (ref 0.0–5.0)
HCT: 42.6 % (ref 36.0–46.0)
Hemoglobin: 14.4 g/dL (ref 12.0–15.0)
Lymphocytes Relative: 31.3 % (ref 12.0–46.0)
Lymphs Abs: 2.1 10*3/uL (ref 0.7–4.0)
MCHC: 33.9 g/dL (ref 30.0–36.0)
MCV: 94.3 fl (ref 78.0–100.0)
Monocytes Absolute: 0.6 10*3/uL (ref 0.1–1.0)
Monocytes Relative: 9.5 % (ref 3.0–12.0)
Neutro Abs: 3.7 10*3/uL (ref 1.4–7.7)
Neutrophils Relative %: 56.5 % (ref 43.0–77.0)
Platelets: 227 10*3/uL (ref 150.0–400.0)
RBC: 4.52 Mil/uL (ref 3.87–5.11)
RDW: 12.4 % (ref 11.5–15.5)
WBC: 6.6 10*3/uL (ref 4.0–10.5)

## 2023-04-30 LAB — COMPREHENSIVE METABOLIC PANEL
ALT: 33 U/L (ref 0–35)
AST: 30 U/L (ref 0–37)
Albumin: 4.4 g/dL (ref 3.5–5.2)
Alkaline Phosphatase: 63 U/L (ref 39–117)
BUN: 13 mg/dL (ref 6–23)
CO2: 30 mEq/L (ref 19–32)
Calcium: 9.7 mg/dL (ref 8.4–10.5)
Chloride: 104 mEq/L (ref 96–112)
Creatinine, Ser: 0.61 mg/dL (ref 0.40–1.20)
GFR: 86.89 mL/min (ref >60.00)
Glucose, Bld: 106 mg/dL — ABNORMAL HIGH (ref 70–99)
Potassium: 3.9 mEq/L (ref 3.5–5.1)
Sodium: 141 mEq/L (ref 135–145)
Total Bilirubin: 0.5 mg/dL (ref 0.2–1.2)
Total Protein: 6.9 g/dL (ref 6.0–8.3)

## 2023-04-30 LAB — HEMOGLOBIN A1C: Hgb A1c MFr Bld: 6.2 % (ref 4.6–6.5)

## 2023-04-30 LAB — VITAMIN B12: Vitamin B-12: >1501 pg/mL — ABNORMAL HIGH (ref 211–911)

## 2023-04-30 MED ORDER — NIRMATRELVIR/RITONAVIR (PAXLOVID)TABLET: 3.0000 | ORAL_TABLET | Freq: Two times a day (BID) | ORAL | 0 refills | Status: AC

## 2023-04-30 NOTE — Patient Instructions (Addendum)
Let us know when you get your  updated COVID vaccine.  Please stop by lab before you go If you have mychart- we will send your results within 3 business days of [place] receiving them.  If you do not have mychart- we will call you about results within 5 business days of [place] receiving them.  *please also note that you will see labs on mychart as soon as they post. I will later go in and write notes on them- will say "notes from Dr. Durene Cal"   Remember on Paxlovid to hold rosuvastatin for 24 hours before and 24 hours after if you have to take it for symptomatic COVID 19  Recommended follow up: Return in about 6 months (around 10/31/2023) for physical or sooner if needed.Schedule b4 you leave.

## 2023-04-30 NOTE — Progress Notes (Signed)
But cardiology has Phone [phone] In person visit   Subjective:   Angela [name] is a 77 y.o. year old very pleasant female patient who presents for/with See problem oriented charting Chief Complaint  Patient presents with   Medical Management of Chronic Issues    Cardiologist wants to switch from metfromin to farxiga and was supposed to send you note.   Hypertension   Hyperlipidemia    Past Medical History-  Patient Active Problem List   Diagnosis Date Noted   Coronary artery disease involving native coronary artery of native heart with angina pectoris (HCC) 07/24/2019    Priority: High   History of breast cancer 03/20/2011    Priority: High   B12 deficiency 04/30/2023    Priority: Medium    Neuropathy 11/10/2019    Priority: Medium    OSA (obstructive sleep apnea) 08/24/2015    Priority: Medium    Hyperglycemia 11/12/2014    Priority: Medium    Osteoporosis-due to prior treatment with Prolia/Fosamax 06/04/2014    Priority: Medium    HTN (hypertension) 07/13/2008    Priority: Medium    Hyperlipidemia 07/09/2007    Priority: Medium    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    Priority: Low   Type 2 macular telangiectasis, 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    Priority: Low   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    Priority: Low   History of skin cancer 02/21/2016    Priority: Low   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 09/14/2015    Priority: Low   Chest discomfort 10/05/2014    Priority: Low   Cataract 07/16/2012    Priority: Low   Pseudophakia of right eye 07/16/2012    Priority: Low   Vitamin D deficiency 07/09/2007    Priority: Low   Pseudophakia of left eye 05/15/2022   Left posterior capsular opacification 05/15/2022   History of colon polyps 06/12/2018   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     aspirin 81 MG tablet Take 81 mg by mouth every evening.     Bromfenac Sodium 0.09 % SOLN      Calcium Carbonate-Vitamin D 600-400 MG-UNIT tablet Take 2 tablets by mouth every evening.      Cholecalciferol (VITAMIN D3) 50 MCG (2000 UT) capsule Take 2,000 Units by mouth every evening.      Coenzyme Q10 (COQ-10) 100 MG CAPS Take 100 mg by mouth daily.      Cyanocobalamin (VITAMIN B 12 PO) Take by mouth.     fish oil-omega-3 fatty acids 1000 MG capsule Take 1 g by mouth every evening.      hydrochlorothiazide (HYDRODIURIL) 25 MG tablet Take 1 tablet (25 mg total) by mouth daily. 90 tablet 3   isosorbide mononitrate (IMDUR) 30 MG 24 hr tablet TAKE 1 TABLET(30 MG) BY MOUTH TWICE DAILY 180 tablet 3   metFORMIN (GLUCOPHAGE) 500 MG tablet TAKE 1 TABLET(500 MG) BY MOUTH TWICE DAILY WITH A MEAL 180 tablet 3   metoprolol succinate (TOPROL-XL) 50 MG 24 hr tablet Take with or immediately following a meal. 90 tablet 3   Multiple Vitamin (MULTIVITAMIN) tablet Take 1 tablet by mouth every evening.     nirmatrelvir/ritonavir (PAXLOVID) 20 x 150 MG & 10  x 100MG  TABS Take 3 tablets by mouth 2 (two) times daily for 5 days. Only take if positive COVID test with symptoms. Hold rosuvastatin 24 hours before starting and 24 hours after finishing. (Take nirmatrelvir 150 mg two tablets twice daily for 5 days and ritonavir 100 mg one tablet twice daily for 5 days) Patient GFR is >60 30 tablet 0   nitroGLYCERIN (NITROSTAT) 0.4 MG SL tablet ONE TABLET UNDER TONGUE AS NEEDED FOR CHEST PAIN 25 tablet 6   potassium chloride SA (KLOR-CON M) 20 MEQ tablet TAKE 1 TABLET BY MOUTH EVERY DAY 90 tablet 3   rosuvastatin (CRESTOR) 40 MG tablet TAKE 1 TABLET(40 MG) BY MOUTH DAILY 90 tablet 3   vitamin C (ASCORBIC ACID) 500 MG tablet Take 500 mg by mouth every evening.      No current facility-administered medications  for this visit.     Objective:  BP 130/80 Comment: no improvement on repeat today- has not taken hctz today as well  Pulse (!) 56   Temp 97.9 F (36.6 C)   Ht 5\' 6"  (1.676 m)   Wt 177 lb 6.4 oz (80.5 kg)   LMP  (LMP Unknown)   SpO2 97%   BMI 28.63 kg/m  Gen: NAD, resting comfortably CV: RRR no murmurs rubs or gallops Lungs: CTAB no crackles, wheeze, rhonchi Ext: no edema Skin: warm, dry     Assessment and Plan   # Social update-has trip planned to [place] in September. Plans on COVID shot before trip if she can find new one. We will send Paxlovid for her but only take if positive COVID and symptomatic on trip.   #History of breast cancer-status post lumpectomy and radiation therapy in 2009.  Completed Arimidex December 2014 after initially starting on tamoxifen.  Continues yearly mammograms   #Coronary artery disease of native artery of native heart with stable angina pectoris -follows with DR. McDowell  #hyperlipidemia S: Medication: aspirin 81 mg, imdur 30mg , nitroglycerin as needed- not needing  rosuvastatin 40 mg, fish oil - occasional left sided chest pain - almost feels like strain but had not strained it as far as she was aware. Once every 2 weeks. Not increasing in frequency- has mentioned to cardiology- they are not thinking cardiac. Prior radiation could have affected things. No shortness of breath no left arm or neck pin Lab Results  Component Value Date   CHOL 114 10/02/2022   HDL 35.40 (L) 10/02/2022   LDLCALC 57 10/02/2022   LDLDIRECT 68.0 08/24/2021   TRIG 107.0 10/02/2022   CHOLHDL 3 10/02/2022   A/P: coronary artery disease appears asymptomatic on imdur - suspect left sided chest pain not heart related- if worsening pattern though will let us know- continue current medications  Lipids at goal with LDL under 70- continue current medications   #hypertension S: medication:  hctz 25mg  (also on potassium), imdur 30mg , metoprolol 50mg  XR BP Readings from Last 3  Encounters:  04/30/23 130/80  04/16/23 114/72  10/08/22 138/76  A/P: stable- continue current medicines   # Hyperglycemia/insulin resistance/prediabetes- peak a1c of 6.0 July 2019 S:  Medication: metformin 500mg  BID Lab Results  Component Value Date   HGBA1C 6.2 10/02/2022   HGBA1C 6.1 03/06/2022   HGBA1C 6.1 08/24/2021  A/P: hopefully stable- update a1c today. Continue current meds for now . I like the idea of considering farxiga or jardiance but shed like to get through trip at least and that's reasonable- I'm not 100% sure if insurance would cover anyway  but we can send in and see next visit  #Vitamin D deficiency/osteoporosis- on prolia S: Medication:  2000 units daily  -I reached out to Dr. Ellin Saba about alendronate for 1 year after finishing prolia and physican was on board- may 2023  finished A/P:  hopefully stable- due for bone density around December- we will order next visit   #neuropathy- unknown cause- ? Prediabetes vs. B12 (checking 2024) or tsh (check 2024). Never had chemo. Will monitor- but better!   -pains in feet better on B12 after level in 300s and started OTC (available over the counter without a prescription) treatment daily   #History of precancerous colon polyp-noted May 2023 by Dr. Claris Che polyp(s) removed were precancerous.  Normally 5 year colonoscopy follow up is recommended.  In general, we stop routine screening  colonoscopy around 80.  Accordingly, we will not contact you to schedule a follow up exam.*  Recommended follow up: Return in about 6 months (around 10/31/2023) for physical or sooner if needed.Schedule b4 you leave. Future Appointments  Date Time Provider Department Center  05/14/2023  1:00 PM Jonelle Sidle, MD [facility]  06/25/2023  9:45 AM Louann Sjogren, DPM [facility]  10/24/2023 10:45 AM [facility] ANNUAL WELLNESS VISIT 1 [facility]   Lab/Order associations: fasting   ICD-10-CM   1. Primary hypertension   I10 Comprehensive metabolic panel    CBC with Differential/Platelet    2. Hyperlipidemia, unspecified hyperlipidemia type  E78.5 Comprehensive metabolic panel    CBC with Differential/Platelet    3. Hyperglycemia  R73.9 Hemoglobin A1c    4. Screening for diabetes mellitus  Z13.1 Hemoglobin A1c    5. Coronary artery disease involving native coronary artery of native heart with angina pectoris (HCC)  I25.119     6. Neuropathy  G62.9     7. B12 deficiency  E53.8 Vitamin B12      Meds ordered this encounter  Medications   nirmatrelvir/ritonavir (PAXLOVID) 20 x 150 MG & 10 x 100MG  TABS    Sig: Take 3 tablets by mouth 2 (two) times daily for 5 days. Only take if positive COVID test with symptoms. Hold rosuvastatin 24 hours before starting and 24 hours after finishing. (Take nirmatrelvir 150 mg two tablets twice daily for 5 days and ritonavir 100 mg one tablet twice daily for 5 days) Patient GFR is >60    Dispense:  30 tablet    Refill:  0    Return precautions advised.  Tana Conch, MD

## 2023-05-03 ENCOUNTER — Encounter: Payer: Self-pay | Admitting: Family Medicine

## 2023-05-04 DIAGNOSIS — G4733 Obstructive sleep apnea (adult) (pediatric): Secondary | ICD-10-CM | POA: Diagnosis not present

## 2023-05-07 ENCOUNTER — Encounter: Payer: Self-pay | Admitting: Family Medicine

## 2023-05-09 ENCOUNTER — Ambulatory Visit: Payer: Medicare PPO | Admitting: Cardiology

## 2023-05-14 ENCOUNTER — Encounter: Payer: Self-pay | Admitting: Cardiology

## 2023-05-14 ENCOUNTER — Ambulatory Visit: Payer: Medicare PPO | Attending: Cardiology | Admitting: Cardiology

## 2023-05-14 VITALS — BP 102/62 | HR 64 | Ht 66.0 in | Wt 177.4 lb

## 2023-05-14 DIAGNOSIS — R7303 Prediabetes: Secondary | ICD-10-CM

## 2023-05-14 DIAGNOSIS — E782 Mixed hyperlipidemia: Secondary | ICD-10-CM | POA: Diagnosis not present

## 2023-05-14 DIAGNOSIS — I25119 Atherosclerotic heart disease of native coronary artery with unspecified angina pectoris: Secondary | ICD-10-CM | POA: Diagnosis not present

## 2023-05-14 NOTE — Patient Instructions (Signed)

## 2023-05-14 NOTE — Progress Notes (Signed)
    Cardiology Office Note  Date: 05/14/2023   ID: [name], DOB 09-13-1945, MRN [id number]  History of Present Illness: Angela [name] is a 77 y.o. female last seen in January.  She is here today with her husband for a follow-up visit.  Reports no increasing chest discomfort, no pattern suggestive of unstable angina.  Remains functionally active, they plan to go to [place] for a trip soon.  I reviewed her medications.  She remains on aspirin, Imdur, Toprol-XL, Crestor, and as needed nitroglycerin.  ECG today shows normal sinus rhythm.  Her LDL was 57 in January.  Physical Exam: VS:  BP 102/62   Pulse 64   Ht 5\' 6"  (1.676 m)   Wt 177 lb 6.4 oz (80.5 kg)   LMP  (LMP Unknown)   SpO2 97%   BMI 28.63 kg/m , BMI Body mass index is 28.63 kg/m.  Wt Readings from Last 3 Encounters:  05/14/23 177 lb 6.4 oz (80.5 kg)  04/30/23 177 lb 6.4 oz (80.5 kg)  04/16/23 172 lb 3.2 oz (78.1 kg)    General: Patient appears comfortable at rest. HEENT: Conjunctiva and lids normal. Neck: Supple, no elevated JVP or carotid bruits. Lungs: Clear to auscultation, nonlabored breathing at rest. Cardiac: Regular rate and rhythm, no S3 or significant systolic murmur. Extremities: No pitting edema.  ECG:  An ECG dated 03/28/2022 was personally reviewed today and demonstrated:  Sinus bradycardia.  Labwork: January 2024: Cholesterol 114, triglycerides 107, HDL 35, LDL 57 10/02/2022: TSH 1.37 04/30/2023: ALT 33; AST 30; BUN 13; Creatinine, Ser 0.61; Hemoglobin 14.4; Platelets 227.0; Potassium 3.9; Sodium 141   Other Studies Reviewed Today:  No interval cardiac testing for review today.  Assessment and Plan:  1.  CAD, mild to moderate by cardiac catheterization in September 2020 with most significant stenoses including 65 to 75% mid LAD and 50 to 60% second obtuse marginal, both managed medically at that time.  LVEF normal range.  She does not report any increasing angina or change in functional  capacity.  ECG is normal today.  Continue observation on medical therapy including aspirin, Imdur, Toprol-XL, Crestor, and as needed nitroglycerin.  2.  Prediabetes.  Last hemoglobin A1c 6.2%.  She is currently on metformin with plan to switch to SGLT2 inhibitor eventually per PCP.  3.  OSA on CPAP.  4.  Essential hypertension.  Continue HCTZ with potassium supplement.  Blood pressure is normal today.  Disposition:  Follow up  6 months.  Signed, Jonelle Sidle, M.D., F.A.C.C.  [facility]

## 2023-05-15 ENCOUNTER — Encounter: Payer: Self-pay | Admitting: Podiatry

## 2023-05-15 ENCOUNTER — Ambulatory Visit: Payer: Medicare PPO | Admitting: Podiatry

## 2023-05-15 DIAGNOSIS — L03031 Cellulitis of right toe: Secondary | ICD-10-CM | POA: Diagnosis not present

## 2023-05-15 DIAGNOSIS — M79609 Pain in unspecified limb: Secondary | ICD-10-CM

## 2023-05-15 DIAGNOSIS — B351 Tinea unguium: Secondary | ICD-10-CM | POA: Diagnosis not present

## 2023-05-16 NOTE — Progress Notes (Signed)
Subjective:   Patient ID: Angela [name], female   DOB: 77 y.o.   MRN: [id number]   HPI Patient presents stating she has developed quite a bit of pain in her right toenail lateral border with slight redness localized no active drainage but slight drainage and has elongated nailbeds 1-5 both feet    ROS      Objective:  Physical Exam  Neurovascular status intact with a irritated right hallux lateral border localized with some scab tissue from previous ingrown toenail removal with slight localized redness no proximal edema erythema drainage noted with elongated nailbeds 1-5 both feet that are thick hard for her to cut and can become painful     Assessment:  Probability for localized paronychia infection right hallux lateral border with mycotic nail infection 1-5 both feet with patient getting ready to go to [place]     Plan:  H&P reviewed today I anesthetized the right hallux 60 mg like Marcaine mixture sterile prep done using sterile instrumentation remove the lateral border proud flesh and opened the channel and flushed the area.  Applied sterile dressing and then debrided nailbeds 1-5 both feet no iatrogenic bleeding and reappoint as symptoms indicate

## 2023-06-05 ENCOUNTER — Encounter: Payer: Self-pay | Admitting: Cardiology

## 2023-06-05 ENCOUNTER — Other Ambulatory Visit: Payer: Self-pay | Admitting: Cardiology

## 2023-06-05 MED ORDER — NITROGLYCERIN 0.4 MG SL SUBL
0.4000 mg | SUBLINGUAL_TABLET | SUBLINGUAL | 3 refills | Status: AC | PRN
Start: 2023-06-05 — End: ?

## 2023-06-05 NOTE — Addendum Note (Signed)
Addended by: Eustace Moore on: 06/05/2023 03:48 PM   Modules accepted: Orders

## 2023-06-19 ENCOUNTER — Encounter: Payer: Self-pay | Admitting: Family Medicine

## 2023-06-25 ENCOUNTER — Ambulatory Visit: Payer: Medicare PPO | Admitting: Podiatry

## 2023-07-10 LAB — LAB REPORT - SCANNED: Creatinine, POC: 21 mg/dL

## 2023-08-27 DIAGNOSIS — H26492 Other secondary cataract, left eye: Secondary | ICD-10-CM | POA: Diagnosis not present

## 2023-08-27 DIAGNOSIS — H4321 Crystalline deposits in vitreous body, right eye: Secondary | ICD-10-CM | POA: Diagnosis not present

## 2023-08-27 DIAGNOSIS — H35073 Retinal telangiectasis, bilateral: Secondary | ICD-10-CM | POA: Diagnosis not present

## 2023-08-27 DIAGNOSIS — Z961 Presence of intraocular lens: Secondary | ICD-10-CM | POA: Diagnosis not present

## 2023-09-12 ENCOUNTER — Other Ambulatory Visit: Payer: Self-pay | Admitting: Cardiology

## 2023-09-12 ENCOUNTER — Other Ambulatory Visit (HOSPITAL_COMMUNITY): Payer: Self-pay | Admitting: Hematology

## 2023-09-12 DIAGNOSIS — G4733 Obstructive sleep apnea (adult) (pediatric): Secondary | ICD-10-CM | POA: Diagnosis not present

## 2023-09-12 DIAGNOSIS — Z1231 Encounter for screening mammogram for malignant neoplasm of breast: Secondary | ICD-10-CM

## 2023-09-13 DIAGNOSIS — L570 Actinic keratosis: Secondary | ICD-10-CM | POA: Diagnosis not present

## 2023-09-13 DIAGNOSIS — Z85828 Personal history of other malignant neoplasm of skin: Secondary | ICD-10-CM | POA: Diagnosis not present

## 2023-09-17 ENCOUNTER — Other Ambulatory Visit: Payer: Self-pay | Admitting: Family Medicine

## 2023-09-23 ENCOUNTER — Ambulatory Visit (HOSPITAL_COMMUNITY)
Admission: RE | Admit: 2023-09-23 | Discharge: 2023-09-23 | Disposition: A | Discharge status: Home / Self Care | Payer: Medicare PPO | Source: Ambulatory Visit | Attending: Hematology | Admitting: Hematology

## 2023-09-23 DIAGNOSIS — Z1231 Encounter for screening mammogram for malignant neoplasm of breast: Secondary | ICD-10-CM | POA: Insufficient documentation

## 2023-10-02 ENCOUNTER — Other Ambulatory Visit: Payer: Self-pay | Admitting: Family Medicine

## 2023-10-29 ENCOUNTER — Ambulatory Visit (INDEPENDENT_AMBULATORY_CARE_PROVIDER_SITE_OTHER): Payer: Medicare PPO

## 2023-10-29 VITALS — BP 136/77 | HR 72 | Ht 66.0 in | Wt 174.0 lb

## 2023-10-29 DIAGNOSIS — Z0000 Encounter for general adult medical examination without abnormal findings: Secondary | ICD-10-CM

## 2023-10-29 NOTE — Progress Notes (Signed)
 Subjective:   Angela [name] is a 78 y.o. female who presents for Medicare Annual (Subsequent) preventive examination.  Visit Complete: Virtual I connected with  [name] on 10/29/23 by a audio enabled telemedicine application and verified that I am speaking with the correct person using two identifiers.  Patient Location: Home  Provider Location: Office/Clinic  I discussed the limitations of evaluation and management by telemedicine. The patient expressed understanding and agreed to proceed.  Vital Signs: Because this visit was a virtual/telehealth visit, some criteria may be missing or patient reported. Any vitals not documented were not able to be obtained and vitals that have been documented are patient reported.  Cardiac Risk Factors include: advanced age (>28men, >88 women);dyslipidemia;hypertension     Objective:    Today's Vitals   10/29/23 1038  BP: 136/77  Pulse: 72  Weight: 174 lb (78.9 kg)  Height: 5\' 6"  (1.676 m)   Body mass index is 28.08 kg/m.     10/29/2023   10:46 AM 10/09/2022   10:06 AM 09/27/2022   11:46 AM 09/22/2021   10:26 AM 09/22/2021   10:22 AM 09/20/2021    2:16 PM 09/16/2020   10:36 AM  Advanced Directives  Does Patient Have a Medical Advance Directive? Yes Yes No Yes Yes Yes Yes  Type of Estate agent of [place];Living will Healthcare Power of [place];Living will  Healthcare Power of Attorney  Living will;Healthcare Power of [employer] Power of [place];Living will  Does patient want to make changes to medical advance directive? No - Patient declined No - Patient declined    No - Patient declined   Copy of Healthcare Power of Attorney in Chart? Yes - validated most recent copy scanned in chart (See row information) Yes - validated most recent copy scanned in chart (See row information)     No - copy requested  Would patient like information on creating a medical advance directive?  No - Patient declined No -  Patient declined   No - Patient declined     Current Medications (verified) Outpatient Encounter Medications as of 10/29/2023  Medication Sig   acetaminophen (TYLENOL) 500 MG tablet acetaminophen 500 mg tablet  Take 2 tablets every 6 hours by oral route as needed.   aspirin 81 MG tablet Take 81 mg by mouth every evening.   Calcium Carbonate-Vitamin D 600-400 MG-UNIT tablet Take 2 tablets by mouth every evening.    Cholecalciferol (VITAMIN D3) 50 MCG (2000 UT) capsule Take 2,000 Units by mouth every evening.    Coenzyme Q10 (COQ-10) 100 MG CAPS Take 100 mg by mouth daily.    Cyanocobalamin (VITAMIN B 12 PO) Take by mouth.   fish oil-omega-3 fatty acids 1000 MG capsule Take 1 g by mouth every evening.    hydrochlorothiazide (HYDRODIURIL) 25 MG tablet Take 1 tablet (25 mg total) by mouth daily.   isosorbide mononitrate (IMDUR) 30 MG 24 hr tablet TAKE 1 TABLET(30 MG) BY MOUTH TWICE DAILY (Patient taking differently: daily.)   metFORMIN (GLUCOPHAGE) 500 MG tablet TAKE 1 TABLET(500 MG) BY MOUTH TWICE DAILY WITH A MEAL   metoprolol succinate (TOPROL-XL) 50 MG 24 hr tablet TAKE 1 TABLET BY MOUTH EVERY DAY IMMEDIATELY FOLLOWING A MEAL   Multiple Vitamin (MULTIVITAMIN) tablet Take 1 tablet by mouth every evening.   nitroGLYCERIN (NITROSTAT) 0.4 MG SL tablet Place 1 tablet (0.4 mg total) under the tongue every 5 (five) minutes x 3 doses as needed for chest pain (if no relief  after 2nd dose, proceed to the ED or call 911).   potassium chloride SA (KLOR-CON M) 20 MEQ tablet TAKE 1 TABLET BY MOUTH EVERY DAY   PSYLLIUM PO Take by mouth.   rosuvastatin (CRESTOR) 40 MG tablet TAKE 1 TABLET(40 MG) BY MOUTH DAILY   vitamin C (ASCORBIC ACID) 500 MG tablet Take 500 mg by mouth every evening.    No facility-administered encounter medications on file as of 10/29/2023.    Allergies (verified) Lisinopril and Statins   History: Past Medical History:  Diagnosis Date   Allergy    seasonal   Arthritis    BCC  (basal cell carcinoma of skin) 08/31/1994   RIGHT CLAVICLE INFERIOR   BCC (basal cell carcinoma of skin) 02/21/1994   RIGHT SCAPULA TX CX3 5FU   BCC (basal cell carcinoma of skin) 02/21/1994   RIGHT CLAVICAL TX CX3 5FU   BCC (basal cell carcinoma of skin) 05/25/1994   RIGHT SCAPULA TX =MOHS   BCC (basal cell carcinoma of skin) 02/02/1999   RIGHT FRONT SHOULDER TX CURET X3 ,EXC   BCC (basal cell carcinoma of skin) 12/25/1999   RIGHT UPPER BACK CX3 5FU   BCC (basal cell carcinoma of skin) 03/29/2005   RIGHT LATER LOWER LEG TX MOHS   BCC (basal cell carcinoma of skin) 11/10/2005   LEFT ANT. NECK TX=MOHS   BCC (basal cell carcinoma of skin) 08/26/2018   RIGHT FRONT SHOULDER TX WITH BX   BCC (basal cell carcinoma) 08/31/1994   RIGHT SUPER CLAVICLE MEDIAL TX CX3 5FU   Breast cancer (HCC) 2009   Left Breast Cancer   CAD (coronary artery disease)    Cataract    Diabetes mellitus without complication (HCC)    Essential hypertension    Hyperlipidemia    Macular retinal cyst of right eye    Nuclear sclerotic cataract of left eye 03/08/2020   The nature of cataract was discussed with the patient as well as the elective nature of surgery. The patient was reassured that surgery at a later date does not put the patient at risk for a worse outcome. It was emphasized that the need for surgery is dictated by the patient's quality of life as influenced by the cataract. Patient was instructed to maintain close follow up with their general eye    OSA (obstructive sleep apnea)    CPAP   Osteopenia    Osteopenia    Personal history of radiation therapy    Sleep apnea    Past Surgical History:  Procedure Laterality Date   BREAST EXCISIONAL BIOPSY Right    BREAST LUMPECTOMY Left 06/2008   CATARACT EXTRACTION  2012   COLONOSCOPY     CORONARY PRESSURE/FFR STUDY N/A 06/01/2019   Procedure: INTRAVASCULAR PRESSURE WIRE/FFR STUDY;  Surgeon: Lyn Records, MD;  Location: [facility];  Service:  Cardiovascular;  Laterality: N/A;   Eyelid surgery Bilateral    LEFT HEART CATH AND CORONARY ANGIOGRAPHY N/A 06/01/2019   Procedure: LEFT HEART CATH AND CORONARY ANGIOGRAPHY;  Surgeon: Lyn Records, MD;  Location: [facility];  Service: Cardiovascular;  Laterality: N/A;   MASS EXCISION  03/2008   Appendix   MOHS SURGERY     POLYPECTOMY     SKIN CANCER EXCISION     TONSILLECTOMY     Family History  Problem Relation Age of Onset   Heart attack Mother    Hypertension Mother    Heart disease Father    Heart attack Father  Hypertension Father    Colon cancer Neg Hx    Stroke Neg Hx    Esophageal cancer Neg Hx    Rectal cancer Neg Hx    Stomach cancer Neg Hx    Colon polyps Neg Hx    Crohn's disease Neg Hx    Social History   Socioeconomic History   Marital status: Married    Spouse name: Not on file   Number of children: 3   Years of education: Not on file   Highest education level: Not on file  Occupational History   Occupation: retired    Associate Professor: RETIRED    Comment: works 1 day a week  Tobacco Use   Smoking status: Never    Passive exposure: Never   Smokeless tobacco: Never  Vaping Use   Vaping status: Never Used  Substance and Sexual Activity   Alcohol use: Yes    Alcohol/week: 3.0 standard drinks of alcohol    Types: 3 Glasses of wine per week    Comment: one glass a week   Drug use: No   Sexual activity: Not Currently  Other Topics Concern   Not on file  Social History Narrative   Married. 2 daughters. 4 grandkids (teenagers in [place] and then two 3 and under in 2021)      Retired from teaching special ed mostly HS      Hobbies: travel, time with friends, eating out, lots of grandkids time in [place]   Social Drivers of Health   Financial Resource Strain: Low Risk  (10/29/2023)   Overall Financial Resource Strain (CARDIA)    Difficulty of Paying Living Expenses: Not hard at all  Food Insecurity: No Food Insecurity (10/29/2023)   Hunger  Vital Sign    Worried About Running Out of Food in the Last Year: Never true    Ran Out of Food in the Last Year: Never true  Transportation Needs: No Transportation Needs (10/29/2023)   PRAPARE - Administrator, Civil Service (Medical): No    Lack of Transportation (Non-Medical): No  Physical Activity: Insufficiently Active (10/29/2023)   Exercise Vital Sign    Days of Exercise per Week: 5 days    Minutes of Exercise per Session: 20 min  Stress: No Stress Concern Present (10/29/2023)   [employer] of Occupational Health - Occupational Stress Questionnaire    Feeling of Stress : Not at all  Social Connections: Socially Integrated (10/29/2023)   Social Connection and Isolation Panel [NHANES]    Frequency of Communication with Friends and Family: More than three times a week    Frequency of Social Gatherings with Friends and Family: More than three times a week    Attends Religious Services: More than 4 times per year    Active Member of [employer] or Organizations: Yes    Attends Banker Meetings: 1 to 4 times per year    Marital Status: Married    Tobacco Counseling Counseling given: Not Answered   Clinical Intake:  Pre-visit preparation completed: Yes  Pain : No/denies pain     BMI - recorded: 28.08 Nutritional Status: BMI 25 -29 Overweight Nutritional Risks: None Diabetes: No  How often do you need to have someone help you when you read instructions, pamphlets, or other written materials from your doctor or pharmacy?: 1 - Never  Interpreter Needed?: No  Information entered by :: Lanier Ensign, LPN   Activities of Daily Living    10/29/2023   10:40 AM  In your present state of health, do you have any difficulty performing the following activities:  Hearing? 1  Comment slight HOH  Vision? 0  Difficulty concentrating or making decisions? 0  Walking or climbing stairs? 1  Comment balace not as well right knee hurting at time with difficulty  getting up  Dressing or bathing? 0  Doing errands, shopping? 0  Preparing Food and eating ? N  Using the Toilet? N  In the past six months, have you accidently leaked urine? N  Do you have problems with loss of bowel control? N  Managing your Medications? N  Managing your Finances? N  Housekeeping or managing your Housekeeping? N    Patient Care Team: Shelva Majestic, MD as PCP - General (Family Medicine) Jonelle Sidle, MD as PCP - Cardiology (Cardiology) Griselda Miner, MD as Consulting Physician (General Surgery) Lurline Hare, MD as Consulting Physician (Radiation Oncology) Louis Meckel, MD (Inactive) as Consulting Physician (Gastroenterology) Elwin Mocha, MD as Consulting Physician (Ophthalmology) Luciana Axe Alford Highland, MD as Consulting Physician (Ophthalmology) Coralyn Helling, MD (Inactive) as Consulting Physician (Pulmonary Disease) Doreatha Massed, MD as Consulting Physician (Hematology) Helane Gunther, DPM as Consulting Physician (Podiatry) Janalyn Harder, MD (Inactive) as Consulting Physician (Dermatology)  Indicate any recent Medical Services you may have received from other than [facility] providers in the past year (date may be approximate).     Assessment:   This is a routine wellness examination for [place].  Hearing/Vision screen Hearing Screening - Comments:: Pt stated slight HOH  Vision Screening - Comments:: Pt follows up with Dr Burgess Estelle for annual eye exams and Dr Luciana Axe for retina specialist    Goals Addressed             This Visit's Progress    Patient Stated       Lose weight        Depression Screen    10/29/2023   10:45 AM 04/30/2023    9:52 AM 10/09/2022   10:05 AM 10/02/2022    9:06 AM 09/22/2021   10:21 AM 01/31/2021   10:36 AM 09/16/2020   10:32 AM  PHQ 2/9 Scores  PHQ - 2 Score 0 0 0 0 0 0 0  PHQ- 9 Score  3 0 0  0     Fall Risk    10/29/2023   10:47 AM 04/30/2023    9:52 AM 10/09/2022   10:07 AM 10/02/2022    9:06 AM  09/22/2021   10:30 AM  Fall Risk   Falls in the past year? 0 0 0 0 0  Number falls in past yr: 0 0 0 0 0  Injury with Fall? 0 0 0 0 0  Risk for fall due to : Impaired balance/gait;Impaired mobility No Fall Risks Impaired vision No Fall Risks Impaired vision  Follow up Falls prevention discussed Falls evaluation completed Falls prevention discussed Falls evaluation completed Falls prevention discussed    MEDICARE RISK AT HOME: Medicare Risk at Home Any stairs in or around the home?: Yes If so, are there any without handrails?: No Home free of loose throw rugs in walkways, pet beds, electrical cords, etc?: Yes Adequate lighting in your home to reduce risk of falls?: Yes Life alert?: No Use of a cane, walker or w/c?: No Grab bars in the bathroom?: Yes Shower chair or bench in shower?: No Elevated toilet seat or a handicapped toilet?: No  TIMED UP AND GO:  Was the test performed?  No  Cognitive Function:        10/29/2023   10:52 AM 10/09/2022   10:08 AM 09/22/2021   10:32 AM 09/16/2020   10:41 AM 07/24/2019   10:45 AM  6CIT Screen  What Year? 0 points 0 points 0 points 0 points 0 points  What month? 0 points 0 points 0 points 0 points 0 points  What time? 0 points 0 points 0 points  0 points  Count back from 20 0 points 0 points 0 points 0 points 0 points  Months in reverse 0 points 0 points 0 points 0 points 0 points  Repeat phrase 0 points 0 points 0 points 0 points 0 points  Total Score 0 points 0 points 0 points  0 points    Immunizations Immunization History  Administered Date(s) Administered   Fluad Quad(high Dose 65+) 06/12/2019, 05/05/2020, 05/04/2022   Hep A / Hep B 03/16/2013, 10/06/2013   Hepatitis B, ADULT 04/17/2013   Influenza Split 06/09/2012   Influenza Whole 05/22/2011   Influenza, High Dose Seasonal PF 04/20/2023   Influenza,inj,Quad PF,6+ Mos 05/27/2013   Influenza-Unspecified 06/10/2014, 05/24/2015, 04/24/2016, 04/26/2017, 05/11/2018, 06/06/2021    Moderna SARS-COV2 Booster Vaccination 12/08/2020, 05/30/2021   Moderna Sars-Covid-2 Vaccination 10/13/2019, 11/12/2019, 07/05/2020, 01/31/2022   PFIZER Comirnaty(Gray Top)Covid-19 Tri-Sucrose Vaccine 05/07/2023   PNEUMOCOCCAL CONJUGATE-20 10/02/2022   Pfizer Covid-19 Vaccine Bivalent Booster 48yrs & up 06/06/2022   Pneumococcal Conjugate-13 11/12/2014   Pneumococcal Polysaccharide-23 08/24/2008, 07/17/2013   Respiratory Syncytial Virus Vaccine,Recomb Aduvanted(Arexvy) 05/04/2022   Td 04/19/2010   Tdap 04/05/2016   Zoster Recombinant(Shingrix) 07/01/2017, 09/20/2017   Zoster, Live 11/17/2008    TDAP status: Up to date  Flu Vaccine status: Up to date  Pneumococcal vaccine status: Up to date  Covid-19 vaccine status: Information provided on how to obtain vaccines.   Qualifies for Shingles Vaccine? Yes   Zostavax completed Yes   Shingrix Completed?: Yes  Screening Tests Health Maintenance  Topic Date Due   COVID-19 Vaccine (7 - 2024-25 season) 07/02/2023   MAMMOGRAM  09/22/2024   Medicare Annual Wellness (AWV)  10/28/2024   DTaP/Tdap/Td (3 - Td or Tdap) 04/05/2026   Pneumonia Vaccine 44+ Years old  Completed   INFLUENZA VACCINE  Completed   DEXA SCAN  Completed   Hepatitis C Screening  Completed   Zoster Vaccines- Shingrix  Completed   HPV VACCINES  Aged Out   Colonoscopy  Discontinued    Health Maintenance  Health Maintenance Due  Topic Date Due   COVID-19 Vaccine (7 - 2024-25 season) 07/02/2023    Colorectal cancer screening: No longer required.   Mammogram status: Completed 09/23/23. Repeat every year  Bone Density status: Completed 08/28/21. Results reflect: Bone density results: OSTEOPOROSIS. Repeat every 2 years.   Additional Screening:  Hepatitis C Screening: Completed 02/21/16  Vision Screening: Recommended annual ophthalmology exams for early detection of glaucoma and other disorders of the eye. Is the patient up to date with their annual eye exam?  Yes   Who is the provider or what is the name of the office in which the patient attends annual eye exams? Dr Burgess Estelle and Dr Luciana Axe  If pt is not established with a provider, would they like to be referred to a provider to establish care? No .   Dental Screening: Recommended annual dental exams for proper oral hygiene    Community Resource Referral / Chronic Care Management: CRR required this visit?  No   CCM required this visit?  No  Plan:     I have personally reviewed and noted the following in the patient's chart:   Medical and social history Use of alcohol, tobacco or illicit drugs  Current medications and supplements including opioid prescriptions. Patient is not currently taking opioid prescriptions. Functional ability and status Nutritional status Physical activity Advanced directives List of other physicians Hospitalizations, surgeries, and ER visits in previous 12 months Vitals Screenings to include cognitive, depression, and falls Referrals and appointments  In addition, I have reviewed and discussed with patient certain preventive protocols, quality metrics, and best practice recommendations. A written personalized care plan for preventive services as well as general preventive health recommendations were provided to patient.     Marzella Schlein, LPN   12/17/8117   After Visit Summary: (MyChart) Due to this being a telephonic visit, the after visit summary with patients personalized plan was offered to patient via MyChart   Nurse Notes: none

## 2023-10-29 NOTE — Patient Instructions (Signed)
 Angela [name] , Thank you for taking time to come for your Medicare Wellness Visit. I appreciate your ongoing commitment to your health goals. Please review the following plan we discussed and let me know if I can assist you in the future.   Referrals/Orders/Follow-Ups/Clinician Recommendations: continue to lose weight  Aim for 30 minutes of exercise or brisk walking, 6-8 glasses of water, and 5 servings of fruits and vegetables each day.   This is a list of the screening recommended for you and due dates:  Health Maintenance  Topic Date Due   COVID-19 Vaccine (7 - 2024-25 season) 07/02/2023   Mammogram  09/22/2024   Medicare Annual Wellness Visit  10/28/2024   DTaP/Tdap/Td vaccine (3 - Td or Tdap) 04/05/2026   Pneumonia Vaccine  Completed   Flu Shot  Completed   DEXA scan (bone density measurement)  Completed   Hepatitis C Screening  Completed   Zoster (Shingles) Vaccine  Completed   HPV Vaccine  Aged Out   Colon Cancer Screening  Discontinued    Advanced directives: (In Chart) A copy of your advanced directives are scanned into your chart should your provider ever need it.  Next Medicare Annual Wellness Visit scheduled for next year: Yes

## 2023-11-14 ENCOUNTER — Encounter: Payer: Medicare PPO | Admitting: Family Medicine

## 2023-11-19 ENCOUNTER — Ambulatory Visit (INDEPENDENT_AMBULATORY_CARE_PROVIDER_SITE_OTHER): Payer: Medicare PPO | Admitting: Family Medicine

## 2023-11-19 ENCOUNTER — Encounter: Payer: Self-pay | Admitting: Family Medicine

## 2023-11-19 VITALS — BP 130/70 | HR 58 | Temp 97.3°F | Ht 66.0 in | Wt 176.8 lb

## 2023-11-19 DIAGNOSIS — I10 Essential (primary) hypertension: Secondary | ICD-10-CM

## 2023-11-19 DIAGNOSIS — E785 Hyperlipidemia, unspecified: Secondary | ICD-10-CM | POA: Diagnosis not present

## 2023-11-19 DIAGNOSIS — Z0000 Encounter for general adult medical examination without abnormal findings: Secondary | ICD-10-CM | POA: Diagnosis not present

## 2023-11-19 DIAGNOSIS — Z131 Encounter for screening for diabetes mellitus: Secondary | ICD-10-CM

## 2023-11-19 DIAGNOSIS — R739 Hyperglycemia, unspecified: Secondary | ICD-10-CM | POA: Diagnosis not present

## 2023-11-19 DIAGNOSIS — M810 Age-related osteoporosis without current pathological fracture: Secondary | ICD-10-CM | POA: Diagnosis not present

## 2023-11-19 DIAGNOSIS — E538 Deficiency of other specified B group vitamins: Secondary | ICD-10-CM | POA: Diagnosis not present

## 2023-11-19 DIAGNOSIS — E559 Vitamin D deficiency, unspecified: Secondary | ICD-10-CM

## 2023-11-19 LAB — LIPID PANEL
Cholesterol: 108 mg/dL (ref 0–200)
HDL: 34.4 mg/dL — ABNORMAL LOW (ref >39.00)
LDL Cholesterol: 55 mg/dL (ref 0–99)
NonHDL: 73.26
Total CHOL/HDL Ratio: 3
Triglycerides: 92 mg/dL (ref 0.0–149.0)
VLDL: 18.4 mg/dL (ref 0.0–40.0)

## 2023-11-19 LAB — VITAMIN D 25 HYDROXY (VIT D DEFICIENCY, FRACTURES): VITD: 48.92 ng/mL (ref 30.00–100.00)

## 2023-11-19 LAB — CBC WITH DIFFERENTIAL/PLATELET
Basophils Absolute: 0 10*3/uL (ref 0.0–0.1)
Basophils Relative: 0.5 % (ref 0.0–3.0)
Eosinophils Absolute: 0.1 10*3/uL (ref 0.0–0.7)
Eosinophils Relative: 2.4 % (ref 0.0–5.0)
HCT: 42.6 % (ref 36.0–46.0)
Hemoglobin: 14.5 g/dL (ref 12.0–15.0)
Lymphocytes Relative: 35.3 % (ref 12.0–46.0)
Lymphs Abs: 2 10*3/uL (ref 0.7–4.0)
MCHC: 34.1 g/dL (ref 30.0–36.0)
MCV: 95.2 fl (ref 78.0–100.0)
Monocytes Absolute: 0.6 10*3/uL (ref 0.1–1.0)
Monocytes Relative: 10.9 % (ref 3.0–12.0)
Neutro Abs: 3 10*3/uL (ref 1.4–7.7)
Neutrophils Relative %: 50.9 % (ref 43.0–77.0)
Platelets: 214 10*3/uL (ref 150.0–400.0)
RBC: 4.47 Mil/uL (ref 3.87–5.11)
RDW: 12.8 % (ref 11.5–15.5)
WBC: 5.8 10*3/uL (ref 4.0–10.5)

## 2023-11-19 LAB — COMPREHENSIVE METABOLIC PANEL
ALT: 37 U/L — ABNORMAL HIGH (ref 0–35)
AST: 34 U/L (ref 0–37)
Albumin: 4.7 g/dL (ref 3.5–5.2)
Alkaline Phosphatase: 65 U/L (ref 39–117)
BUN: 17 mg/dL (ref 6–23)
CO2: 32 meq/L (ref 19–32)
Calcium: 10.2 mg/dL (ref 8.4–10.5)
Chloride: 103 meq/L (ref 96–112)
Creatinine, Ser: 0.66 mg/dL (ref 0.40–1.20)
GFR: 84.92 mL/min (ref >60.00)
Glucose, Bld: 108 mg/dL — ABNORMAL HIGH (ref 70–99)
Potassium: 4.7 meq/L (ref 3.5–5.1)
Sodium: 140 meq/L (ref 135–145)
Total Bilirubin: 0.6 mg/dL (ref 0.2–1.2)
Total Protein: 7.1 g/dL (ref 6.0–8.3)

## 2023-11-19 LAB — HEMOGLOBIN A1C: Hgb A1c MFr Bld: 6.2 % (ref 4.6–6.5)

## 2023-11-19 LAB — VITAMIN B12: Vitamin B-12: 1380 pg/mL — ABNORMAL HIGH (ref 211–911)

## 2023-11-19 NOTE — Progress Notes (Signed)
 Phone [phone]   Subjective:  Patient presents today for their annual physical. Chief complaint-noted.   See problem oriented charting- ROS- full  review of systems was completed and negative except for: leg weakness improving with exercise, constipation in the last 6 months-requiring fiber supplement  The following were reviewed and entered/updated in epic: Past Medical History:  Diagnosis Date   Allergy    seasonal   Arthritis    BCC (basal cell carcinoma of skin) 08/31/1994   RIGHT CLAVICLE INFERIOR   BCC (basal cell carcinoma of skin) 02/21/1994   RIGHT SCAPULA TX CX3 5FU   BCC (basal cell carcinoma of skin) 02/21/1994   RIGHT CLAVICAL TX CX3 5FU   BCC (basal cell carcinoma of skin) 05/25/1994   RIGHT SCAPULA TX =MOHS   BCC (basal cell carcinoma of skin) 02/02/1999   RIGHT FRONT SHOULDER TX CURET X3 ,EXC   BCC (basal cell carcinoma of skin) 12/25/1999   RIGHT UPPER BACK CX3 5FU   BCC (basal cell carcinoma of skin) 03/29/2005   RIGHT LATER LOWER LEG TX MOHS   BCC (basal cell carcinoma of skin) 11/10/2005   LEFT ANT. NECK TX=MOHS   BCC (basal cell carcinoma of skin) 08/26/2018   RIGHT FRONT SHOULDER TX WITH BX   BCC (basal cell carcinoma) 08/31/1994   RIGHT SUPER CLAVICLE MEDIAL TX CX3 5FU   Breast cancer (HCC) 2009   Left Breast Cancer   CAD (coronary artery disease)    Cataract    Diabetes mellitus without complication (HCC)    Essential hypertension    Hyperlipidemia    Macular retinal cyst of right eye    Nuclear sclerotic cataract of left eye 03/08/2020   The nature of cataract was discussed with the patient as well as the elective nature of surgery. The patient was reassured that surgery at a later date does not put the patient at risk for a worse outcome. It was emphasized that the need for surgery is dictated by the patient's quality of life as influenced by the cataract. Patient was instructed to maintain close follow up with their general eye    OSA  (obstructive sleep apnea)    CPAP   Osteopenia    Osteopenia    Personal history of radiation therapy    Sleep apnea    Patient Active Problem List   Diagnosis Date Noted   Coronary artery disease involving native coronary artery of native heart with angina pectoris (HCC) 07/24/2019    Priority: High   History of breast cancer 03/20/2011    Priority: High   B12 deficiency 04/30/2023    Priority: Medium    Neuropathy 11/10/2019    Priority: Medium    OSA (obstructive sleep apnea) 08/24/2015    Priority: Medium    Hyperglycemia 11/12/2014    Priority: Medium    Osteoporosis-due to prior treatment with Prolia/Fosamax 06/04/2014    Priority: Medium    HTN (hypertension) 07/13/2008    Priority: Medium    Hyperlipidemia 07/09/2007    Priority: Medium    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    Priority: Low   Type 2 macular telangiectasis, 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    Priority: Low   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    Priority: Low   History of skin cancer 02/21/2016    Priority: Low   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 09/14/2015    Priority: Low   Chest discomfort 10/05/2014    Priority: Low   Cataract 07/16/2012  Priority: Low   Pseudophakia of right eye 07/16/2012    Priority: Low   Vitamin D deficiency 07/09/2007    Priority: Low   Pseudophakia of left eye 05/15/2022   Left posterior capsular opacification 05/15/2022   History of colon polyps 06/12/2018   Macular retinal cyst 07/16/2012   Malignant neoplasm of breast (female) (HCC) 03/20/2011   Past Surgical History:  Procedure Laterality Date   BREAST EXCISIONAL BIOPSY Right    BREAST LUMPECTOMY Left 06/2008   CATARACT EXTRACTION  2012   COLONOSCOPY     CORONARY PRESSURE/FFR STUDY N/A 06/01/2019   Procedure: INTRAVASCULAR PRESSURE WIRE/FFR STUDY;  Surgeon: Lyn Records, MD;  Location: [facility];  Service: Cardiovascular;  Laterality: N/A;   Eyelid surgery Bilateral    LEFT  HEART CATH AND CORONARY ANGIOGRAPHY N/A 06/01/2019   Procedure: LEFT HEART CATH AND CORONARY ANGIOGRAPHY;  Surgeon: Lyn Records, MD;  Location: [facility];  Service: Cardiovascular;  Laterality: N/A;   MASS EXCISION  03/2008   Appendix   MOHS SURGERY     POLYPECTOMY     SKIN CANCER EXCISION     TONSILLECTOMY      Family History  Problem Relation Age of Onset   Heart attack Mother    Hypertension Mother    Heart disease Father    Heart attack Father    Hypertension Father    Colon cancer Neg Hx    Stroke Neg Hx    Esophageal cancer Neg Hx    Rectal cancer Neg Hx    Stomach cancer Neg Hx    Colon polyps Neg Hx    Crohn's disease Neg Hx     Medications- reviewed and updated Current Outpatient Medications  Medication Sig Dispense Refill   acetaminophen (TYLENOL) 500 MG tablet acetaminophen 500 mg tablet  Take 2 tablets every 6 hours by oral route as needed.     aspirin 81 MG tablet Take 81 mg by mouth every evening.     Calcium Carbonate-Vitamin D 600-400 MG-UNIT tablet Take 2 tablets by mouth every evening.      Cholecalciferol (VITAMIN D3) 50 MCG (2000 UT) capsule Take 2,000 Units by mouth every evening.      Coenzyme Q10 (COQ-10) 100 MG CAPS Take 100 mg by mouth daily.      Cyanocobalamin (VITAMIN B 12 PO) Take by mouth.     fish oil-omega-3 fatty acids 1000 MG capsule Take 1 g by mouth every evening.      hydrochlorothiazide (HYDRODIURIL) 25 MG tablet Take 1 tablet (25 mg total) by mouth daily. 90 tablet 3   isosorbide mononitrate (IMDUR) 30 MG 24 hr tablet TAKE 1 TABLET(30 MG) BY MOUTH TWICE DAILY (Patient taking differently: daily.) 180 tablet 3   metFORMIN (GLUCOPHAGE) 500 MG tablet TAKE 1 TABLET(500 MG) BY MOUTH TWICE DAILY WITH A MEAL 180 tablet 3   metoprolol succinate (TOPROL-XL) 50 MG 24 hr tablet TAKE 1 TABLET BY MOUTH EVERY DAY IMMEDIATELY FOLLOWING A MEAL 90 tablet 3   Multiple Vitamin (MULTIVITAMIN) tablet Take 1 tablet by mouth every evening.      nitroGLYCERIN (NITROSTAT) 0.4 MG SL tablet Place 1 tablet (0.4 mg total) under the tongue every 5 (five) minutes x 3 doses as needed for chest pain (if no relief after 2nd dose, proceed to the ED or call 911). 25 tablet 3   potassium chloride SA (KLOR-CON M) 20 MEQ tablet TAKE 1 TABLET BY MOUTH EVERY DAY 90 tablet 3  PSYLLIUM PO Take by mouth.     rosuvastatin (CRESTOR) 40 MG tablet TAKE 1 TABLET(40 MG) BY MOUTH DAILY 90 tablet 3   vitamin C (ASCORBIC ACID) 500 MG tablet Take 500 mg by mouth every evening.      No current facility-administered medications for this visit.    Allergies-reviewed and updated Allergies  Allergen Reactions   Lisinopril Cough   Statins Other (See Comments)    Myalgia with lipitor- unproven She is able to tolerate simvastatin    Social History   Social History Narrative   Married. 2 daughters. 4 grandkids (teenagers in [place] and then two 3 and under in 2021)      Retired from teaching special ed mostly HS      Hobbies: travel, time with friends, eating out, lots of grandkids time in [place]   Objective  Objective:  BP 130/70   Pulse (!) 58   Temp (!) 97.3 F (36.3 C)   Ht 5\' 6"  (1.676 m)   Wt 176 lb 12.8 oz (80.2 kg)   LMP  (LMP Unknown)   SpO2 95%   BMI 28.54 kg/m  Gen: NAD, resting comfortably HEENT: Mucous membranes are moist. Oropharynx normal Neck: no thyromegaly CV: RRR no murmurs rubs or gallops Lungs: CTAB no crackles, wheeze, rhonchi Abdomen: soft/nontender/nondistended/normal bowel sounds. No rebound or guarding.  Ext: no edema Skin: warm, dry Neuro: grossly normal, moves all extremities, PERRLA   Assessment and Plan   78 y.o. female presenting for annual physical.  Health Maintenance counseling: 1. Anticipatory guidance: Patient counseled regarding regular dental exams -q6 months, eye exams - regular follow up more than yearly,  avoiding smoking and second hand smoke , limiting alcohol to 1 beverage per day- very sparing , no  illicit drugs .   2. Risk factor reduction:  Advised patient of need for regular exercise and diet rich and fruits and vegetables to reduce risk of heart attack and stroke.  Exercise-continues exercise 3 days a week- walking a mile a week and 2 days a week silver sneakers Diet/weight management-weight within 1 pound of last physical- feels could cut down on breads and sheets.  Wt Readings from Last 3 Encounters:  11/19/23 176 lb 12.8 oz (80.2 kg)  10/29/23 174 lb (78.9 kg)  05/14/23 177 lb 6.4 oz (80.5 kg)  3. Immunizations/screenings/ancillary studies-fully up-to-date on immunizations. Had COVID booster last friday Immunization History  Administered Date(s) Administered   Fluad Quad(high Dose 65+) 06/12/2019, 05/05/2020, 05/04/2022   Hep A / Hep B 03/16/2013, 10/06/2013   Hepatitis B, ADULT 04/17/2013   Influenza Split 06/09/2012   Influenza Whole 05/22/2011   Influenza, High Dose Seasonal PF 04/20/2023   Influenza,inj,Quad PF,6+ Mos 05/27/2013   Influenza-Unspecified 06/10/2014, 05/24/2015, 04/24/2016, 04/26/2017, 05/11/2018, 06/06/2021   Moderna Covid-19 Fall Seasonal Vaccine 18yrs & older 11/15/2023   Moderna SARS-COV2 Booster Vaccination 12/08/2020, 05/30/2021   Moderna Sars-Covid-2 Vaccination 10/13/2019, 11/12/2019, 07/05/2020, 01/31/2022   PFIZER Comirnaty(Gray Top)Covid-19 Tri-Sucrose Vaccine 05/07/2023   PNEUMOCOCCAL CONJUGATE-20 10/02/2022   Pfizer Covid-19 Vaccine Bivalent Booster 77yrs & up 06/06/2022   Pneumococcal Conjugate-13 11/12/2014   Pneumococcal Polysaccharide-23 08/24/2008, 07/17/2013   Respiratory Syncytial Virus Vaccine,Recomb Aduvanted(Arexvy) 05/04/2022   Td 04/19/2010   Tdap 04/05/2016   Zoster Recombinant(Shingrix) 07/01/2017, 09/20/2017   Zoster, Live 11/17/2008  4. Cervical cancer screening- no longer sees GYN- no vaginal bleeding or discharge. Past age based screening recommendations  5. Breast cancer screening-  breast exam opts out in office and  mammogram -with  breast cancer history maintains regular mammograms and just had done in September 23 2023  6. Colon cancer screening - patient with precancerous colon polyp May 2023 but Dr. Lavon Paganini reported no repeat colonoscopy due to age- no blood in stool or melena 7. Skin cancer screening-sees [facility] dermatology  (prior [place])- has seen them. advised regular sunscreen use. Denies worrisome, changing, or new skin lesions.  8. Birth control/STD check- postmenopausal and monogomous 9. Osteoporosis screening at 65- see below 10. Smoking associated screening -never smoker  Status of chronic or acute concerns   # Social update-had amazing trip to [place]- . Did catch COVID but had been given Paxlovid previously.   # Constipation-patient complains of increased constipation/difficulty of bowel movements in the last 6 months.  Reports cannot have a bowel movement unless she takes a fiber supplement such as psyllium. As long as no worsening issues with recent reassuring colonoscopy hold off on intervention- psyillium is a reasonable choice  # Difficulty with standing- has found it difficult to get out of chair/pulling up. Pulling up with arms is required - backup thas been doing silver sneakers and getting better.   #History of breast cancer-status post lumpectomy and radiation therapy in 2009.  Completed Arimidex December 2014 after initially starting on tamoxifen.  Continues yearly mammograms- see above   #Coronary artery disease of native artery of native heart with stable angina pectoris -follows with DR. McDowell  #hyperlipidemia S: Medication: aspirin 81 mg, imdur 30mg , nitroglycerin prn, rosuvastatin 40 mg, fish oil- also on metoprolol XR  - no chest pain or shortness of breath  Lab Results  Component Value Date   CHOL 114 10/02/2022   HDL 35.40 (L) 10/02/2022   LDLCALC 57 10/02/2022   LDLDIRECT 68.0 08/24/2021   TRIG 107.0 10/02/2022   CHOLHDL 3 10/02/2022   A/P: coronary artery  disease with stable angina on imdur- continue current medications  Lipids at goal but update today- possible better on psyllium  #hypertension S: medication:  hctz 25mg  (also on potassium), imdur 30mg , metoprolol 50mg  XR A/P: well controlled continue current medications   # Hyperglycemia/insulin resistance/prediabetes- peak a1c of 6.2 in 2024 S:  Medication: metformin 500mg  BID but cardiology has been interested in her trying Jardiance  Lab Results  Component Value Date   HGBA1C 6.2 04/30/2023   HGBA1C 6.2 10/02/2022   HGBA1C 6.1 03/06/2022  A/P: prediabetes note-D discussed jardiance my not be covered for prediabetes so holding off- we could trial off metformin but shed prefer to stay on   #Vitamin D deficiency/osteoporosis- on prolia S: Medication:  2000 units daily  -I reached out to Dr. Ellin Saba about alendronate for 1 year after finishing prolia and physican was on board- may 2023  finished A/P: update bone density today- only on vitamin D at this point plus calcium- will do at [employer] [facility]   #neuropathy- unknown cause- feet better with massage with cream twice daily  #B12 deficiency- now just on 3x a week repletion  Recommended follow up: Return in about 6 months (around 05/21/2024) for followup or sooner if needed.Schedule b4 you leave. Future Appointments  Date Time Provider Department Center  11/03/2024 10:40 AM [facility] ANNUAL WELLNESS VISIT 1 [facility]   Lab/Order associations: fasting   ICD-10-CM   1. Preventative health care  Z00.00     2. Hyperlipidemia, unspecified hyperlipidemia type  E78.5 Comprehensive metabolic panel    CBC with Differential/Platelet    Lipid panel    3. Primary hypertension  I10 Comprehensive metabolic  panel    CBC with Differential/Platelet    Lipid panel    4. Hyperglycemia  R73.9 Hemoglobin A1c    5. Age-related osteoporosis without current pathological fracture  M81.0 VITAMIN D 25 Hydroxy (Vit-D Deficiency, Fractures)    DG Bone  Density    6. B12 deficiency  E53.8 VITAMIN D 25 Hydroxy (Vit-D Deficiency, Fractures)    7. Vitamin D deficiency  E55.9 Vitamin B12    8. Screening for diabetes mellitus  Z13.1 Hemoglobin A1c      No orders of the defined types were placed in this encounter.   Return precautions advised.  Tana Conch, MD

## 2023-11-19 NOTE — Patient Instructions (Addendum)
 Call [facility] if you haven't heard within a week about bone density- ordered today  Please stop by lab before you go If you have mychart- we will send your results within 3 business days of [place] receiving them.  If you do not have mychart- we will call you about results within 5 business days of [place] receiving them.  *please also note that you will see labs on mychart as soon as they post. I will later go in and write notes on them- will say "notes from Dr. Durene Cal"   Recommended follow up: Return in about 6 months (around 05/21/2024) for followup or sooner if needed.Schedule b4 you leave.

## 2023-12-07 ENCOUNTER — Encounter: Payer: Self-pay | Admitting: Family Medicine

## 2023-12-10 ENCOUNTER — Encounter: Payer: Self-pay | Admitting: Family Medicine

## 2023-12-10 ENCOUNTER — Ambulatory Visit

## 2023-12-10 ENCOUNTER — Ambulatory Visit: Admitting: Family Medicine

## 2023-12-10 VITALS — BP 138/78 | HR 67 | Temp 97.8°F | Ht 66.0 in | Wt 174.8 lb

## 2023-12-10 DIAGNOSIS — I25119 Atherosclerotic heart disease of native coronary artery with unspecified angina pectoris: Secondary | ICD-10-CM | POA: Diagnosis not present

## 2023-12-10 DIAGNOSIS — M25562 Pain in left knee: Secondary | ICD-10-CM

## 2023-12-10 DIAGNOSIS — I10 Essential (primary) hypertension: Secondary | ICD-10-CM

## 2023-12-10 DIAGNOSIS — G8929 Other chronic pain: Secondary | ICD-10-CM

## 2023-12-10 DIAGNOSIS — M25561 Pain in right knee: Secondary | ICD-10-CM | POA: Diagnosis not present

## 2023-12-10 DIAGNOSIS — M170 Bilateral primary osteoarthritis of knee: Secondary | ICD-10-CM | POA: Diagnosis not present

## 2023-12-10 NOTE — Progress Notes (Signed)
 Phone [phone] In person visit   Subjective:   Angela [name] is a 78 y.o. year old very pleasant female patient who presents for/with See problem oriented charting Chief Complaint  Patient presents with   Knee Pain    Pt c/o of right knee pain for 1 week - pt wears knee brace to help with walking, pt has used ice for knee as well. Pt c/o of swelling, Pain when walking only and getting up and down.    Past Medical History-  Patient Active Problem List   Diagnosis Date Noted   Coronary artery disease involving native coronary artery of native heart with angina pectoris (HCC) 07/24/2019    Priority: High   History of breast cancer 03/20/2011    Priority: High   B12 deficiency 04/30/2023    Priority: Medium    Neuropathy 11/10/2019    Priority: Medium    OSA (obstructive sleep apnea) 08/24/2015    Priority: Medium    Hyperglycemia 11/12/2014    Priority: Medium    Osteoporosis-due to prior treatment with Prolia/Fosamax 06/04/2014    Priority: Medium    HTN (hypertension) 07/13/2008    Priority: Medium    Hyperlipidemia 07/09/2007    Priority: Medium    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    Priority: Low   Type 2 macular telangiectasis, 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    Priority: Low   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    Priority: Low   History of skin cancer 02/21/2016    Priority: Low   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 09/14/2015    Priority: Low   Chest discomfort 10/05/2014    Priority: Low   Cataract 07/16/2012    Priority: Low   Pseudophakia of right eye 07/16/2012    Priority: Low   Vitamin D deficiency 07/09/2007    Priority: Low   Pseudophakia of left eye 05/15/2022   Left posterior capsular opacification 05/15/2022   History of colon polyps 06/12/2018   Macular retinal cyst 07/16/2012    Medications- reviewed and updated Current Outpatient Medications  Medication Sig Dispense Refill   acetaminophen (TYLENOL) 500 MG tablet  acetaminophen 500 mg tablet  Take 2 tablets every 6 hours by oral route as needed.     aspirin 81 MG tablet Take 81 mg by mouth every evening.     Calcium Carbonate-Vitamin D 600-400 MG-UNIT tablet Take 2 tablets by mouth every evening.      Cholecalciferol (VITAMIN D3) 50 MCG (2000 UT) capsule Take 2,000 Units by mouth every evening.      Coenzyme Q10 (COQ-10) 100 MG CAPS Take 100 mg by mouth daily.      Cyanocobalamin (VITAMIN B 12 PO) Take by mouth.     fish oil-omega-3 fatty acids 1000 MG capsule Take 1 g by mouth every evening.      hydrochlorothiazide (HYDRODIURIL) 25 MG tablet Take 1 tablet (25 mg total) by mouth daily. 90 tablet 3   isosorbide mononitrate (IMDUR) 30 MG 24 hr tablet TAKE 1 TABLET(30 MG) BY MOUTH TWICE DAILY (Patient taking differently: daily.) 180 tablet 3   metFORMIN (GLUCOPHAGE) 500 MG tablet TAKE 1 TABLET(500 MG) BY MOUTH TWICE DAILY WITH A MEAL 180 tablet 3   metoprolol succinate (TOPROL-XL) 50 MG 24 hr tablet TAKE 1 TABLET BY MOUTH EVERY DAY IMMEDIATELY FOLLOWING A MEAL 90 tablet 3   Multiple Vitamin (MULTIVITAMIN) tablet Take 1 tablet by mouth every evening.     nitroGLYCERIN (NITROSTAT) 0.4 MG SL tablet Place  1 tablet (0.4 mg total) under the tongue every 5 (five) minutes x 3 doses as needed for chest pain (if no relief after 2nd dose, proceed to the ED or call 911). 25 tablet 3   potassium chloride SA (KLOR-CON M) 20 MEQ tablet TAKE 1 TABLET BY MOUTH EVERY DAY 90 tablet 3   PSYLLIUM PO Take by mouth.     rosuvastatin (CRESTOR) 40 MG tablet TAKE 1 TABLET(40 MG) BY MOUTH DAILY 90 tablet 3   vitamin C (ASCORBIC ACID) 500 MG tablet Take 500 mg by mouth every evening.      No current facility-administered medications for this visit.     Objective:  BP 138/78   Pulse 67   Temp 97.8 F (36.6 C)   Ht 5\' 6"  (1.676 m)   Wt 174 lb 12.8 oz (79.3 kg)   LMP  (LMP Unknown)   SpO2 98%   BMI 28.21 kg/m  Gen: NAD, resting comfortably Skin: warm, dry MSK: Slight  effusion on left knee-more prominent on the right knee and patient with midline joint pain.  Good strength in lower extremities.  No obvious injury to MCL, LCL, ACL, PCL with provocative testing.  She does report some medial knee pain with meniscus testing with no click or pop    Assessment and Plan    # Right knee pain S: Patient reports knee pain for 1 week- no fall or injury- had started silver sneakers but no incident she can recall- gradually worsening since it started (more than a week ago was hurting in back of knee and that did resolve)- this pain is around the knee and under patella.  Has tried a knee brace to help with walking with some relief.  Has tried ice as well.  Has noted some swelling.  Has pain with walking and getting up and down (particularly up is painful like getting off toilet)-okay at rest.  - does not feel unstable or weak -no left knee pain  -15 years ago had similar pain and went to [facility] [place] and had relief with an injection and she is traveling to [place] in about 10 days and would like to consider injection at this time. -She does report some lower level chronic bilateral knee pain and we both wondered about potential arthritis  A/P: 78 year old female with lower level bilateral knee pain on a more regular basis but with acute flare of the knee pain about a week ago without fall or injury.  Suspect flare of arthritis.  Recommended Voltaren gel over Aleve due to CAD and being on aspirin and hypertension -We will get standing bilateral knee films to further evaluate potential arthritis -Referral to sports medicine to consider injection.  Her A1c has been reasonably well-controlled for prediabetes on metformin and I think she could tolerate an injection     #Coronary artery disease of native artery of native heart with stable angina pectoris -follows with DR. Diona Browner  S: Medication: aspirin 81 mg, imdur 30mg , nitroglycerin prn, rosuvastatin 40 mg,  fish oil -No chest pain or shortness of breath reported  A/P: CAD with stable angina-no chest pain reported on Imdur.  Due to CAD and need to be on aspirin 81 mg as well recommended against Aleve usage  #hypertension S: medication:  hctz 25mg  (also on potassium), imdur 30mg , metoprolol 50mg  XR Home readings #s: Reports blood pressure is even better at home BP Readings from Last 3 Encounters:  12/10/23 138/78  11/19/23 130/70  10/29/23  136/77   A/P: Blood pressure acceptable today but reports improved readings at home-continue current medication.  Did recommend against Aleve which could raise blood pressure  Recommended follow up: Return for next already scheduled visit or sooner if needed. Future Appointments  Date Time Provider Department Center  01/07/2024  9:00 AM [facility]  04/16/2024  2:00 PM Noemi Chapel, NP [facility]  05/26/2024  9:20 AM Shelva Majestic, MD [facility]  11/03/2024 10:40 AM [facility] ANNUAL WELLNESS VISIT 1 [facility]   Lab/Order associations:   ICD-10-CM   1. Acute pain of right knee  M25.561 DG Knee Bilateral Standing AP    Ambulatory referral to Sports Medicine    2. Chronic pain of both knees  M25.561 DG Knee Bilateral Standing AP   M25.562 Ambulatory referral to Sports Medicine   G89.29     3. Coronary artery disease involving native coronary artery of native heart with angina pectoris (HCC)  I25.119     4. Primary hypertension  I10      No orders of the defined types were placed in this encounter.  Return precautions advised.  Tana Conch, MD

## 2023-12-10 NOTE — Patient Instructions (Addendum)
 Stop by x-ray before you go . Hoping to have results in 10 days but sometimes its takign up to 3 weeks for x-ray reads  Consider Voltaren gel topically 4x a day for 2 weeks- safer side effects profile than aleve  Recommended follow up: Return for next already scheduled visit or sooner if needed.

## 2023-12-11 ENCOUNTER — Encounter: Payer: Self-pay | Admitting: Family Medicine

## 2023-12-11 ENCOUNTER — Other Ambulatory Visit: Payer: Self-pay

## 2023-12-11 ENCOUNTER — Ambulatory Visit: Admitting: Family Medicine

## 2023-12-11 VITALS — BP 138/82 | HR 58 | Ht 66.0 in | Wt 180.0 lb

## 2023-12-11 DIAGNOSIS — G8929 Other chronic pain: Secondary | ICD-10-CM

## 2023-12-11 DIAGNOSIS — I25119 Atherosclerotic heart disease of native coronary artery with unspecified angina pectoris: Secondary | ICD-10-CM | POA: Diagnosis not present

## 2023-12-11 DIAGNOSIS — I10 Essential (primary) hypertension: Secondary | ICD-10-CM | POA: Diagnosis not present

## 2023-12-11 DIAGNOSIS — M1711 Unilateral primary osteoarthritis, right knee: Secondary | ICD-10-CM | POA: Diagnosis not present

## 2023-12-11 DIAGNOSIS — M25561 Pain in right knee: Secondary | ICD-10-CM | POA: Diagnosis not present

## 2023-12-11 NOTE — Patient Instructions (Addendum)
 Thank you for coming in today.   You received an injection today. Seek immediate medical attention if the joint becomes red, extremely painful, or is oozing fluid.   Let me know if you're interested in the knee brace.   A referral for physical therapy has been submitted. A representative from the physical therapy office will contact you to coordinate scheduling after confirming your benefits with your insurance provider. If you do not hear from the physical therapy office within the next 1-2 weeks, please let us know.   See you back as needed.

## 2023-12-11 NOTE — Progress Notes (Signed)
 I, Stevenson Clinch, CMA acting as a scribe for Angela Graham, MD.  Angela [name] is a 78 y.o. female who presents to [facility] today for R knee pain x 1-wk. No falls or injury. She has recently started a silver sneakers program. Pt locates pain to medial aspect of the knee and distal to patella. Sx worse with sit-to-stand, ascending stairs. Denies mechanical sx. Intermittent swelling. Short-term relief with topical Voltaren. Pain will radiate into the lower leg at times. Sx not causing night disturbance but is inhibiting what she is able to do throughout the day.   R Knee swelling: intermittently Mechanical symptoms: no Aggravates: walking, transitioning to stand Treatments tried: ice, knee brace, Voltaren Gel  Dx imaging: 12/10/23 Knee bilat standing XR  Pertinent review of systems: No fevers or chills  Relevant historical information: Hypertension and coronary artery disease.  Osteoporosis.   Exam:  BP 138/82   Pulse (!) 58   Ht 5\' 6"  (1.676 m)   Wt 180 lb (81.6 kg)   LMP  (LMP Unknown)   SpO2 98%   BMI 29.05 kg/m  General: Well Developed, well nourished, and in no acute distress.   MSK: Right knee mild effusion normal.  Otherwise normal motion.  Tender palpation medial joint line. Some laxity to the MCL stress test. Intact strength.   Lab and Radiology Results  Procedure: Real-time Ultrasound Guided Injection of right knee joint superior lateral patella space Device: Philips Affiniti 50G/GE Logiq Images permanently stored and available for review in PACS Verbal informed consent obtained.  Discussed risks and benefits of procedure. Warned about infection, bleeding, hyperglycemia damage to structures among others. Patient expresses understanding and agreement Time-out conducted.   Noted no overlying erythema, induration, or other signs of local infection.   Skin prepped in a sterile fashion.   Local anesthesia: Topical Ethyl chloride.    With sterile technique and under real time ultrasound guidance: 40 mg of Kenalog and 2 mL of Marcaine injected into knee joint. Fluid seen entering the joint capsule.   Completed without difficulty   Pain immediately resolved suggesting accurate placement of the medication.   Advised to call if fevers/chills, erythema, induration, drainage, or persistent bleeding.   Images permanently stored and available for review in the ultrasound unit.  Impression: Technically successful ultrasound guided injection.   X-ray images bilateral knees obtained at PCP office yesterday personally and independently interpreted today. Moderate medial compartment DJD right knee. No acute fractures. Await formal radiology review   Assessment and Plan: 78 y.o. female with chronic right knee pain due to DJD.  Plan for steroid injection Voltaren gel and Tylenol.  Plan to refer to PT.  She lives in [place] so use [facility] in [place].  Consider an off loader knee brace.  That would help osteoarthritis and instability.  She will let me know if she would like to do that. We talked about appropriate medication dosing.  Tylenol arthritis and Voltaren gel are okay.  Should avoid frequent use of oral NSAIDs given heart risk with hypertension and coronary artery disease.  PDMP not reviewed this encounter. Orders Placed This Encounter  Procedures   [place] LIMITED JOINT SPACE STRUCTURES LOW RIGHT(NO LINKED CHARGES)    Reason for Exam (SYMPTOM  OR DIAGNOSIS REQUIRED):   right knee pain    Preferred imaging location?:   [facility] referral to Physical Therapy    Referral Priority:   Routine  Referral Type:   Physical Medicine    Referral Reason:   Specialty Services Required    Requested Specialty:   Physical Therapy    Number of Visits Requested:   1   No orders of the defined types were placed in this encounter.    Discussed warning signs or symptoms. Please see  discharge instructions. Patient expresses understanding.   The above documentation has been reviewed and is accurate and complete Angela [name], M.D.

## 2023-12-16 ENCOUNTER — Encounter: Payer: Self-pay | Admitting: Family Medicine

## 2023-12-17 ENCOUNTER — Other Ambulatory Visit (HOSPITAL_COMMUNITY)

## 2023-12-17 NOTE — Telephone Encounter (Signed)
 Pt received inj 12/11/23. Forwarding to Dr. Denyse Amass to review and advise.

## 2023-12-18 DIAGNOSIS — M25561 Pain in right knee: Secondary | ICD-10-CM | POA: Diagnosis not present

## 2023-12-18 MED ORDER — TRAMADOL HCL 50 MG PO TABS: 50.0000 mg | ORAL_TABLET | Freq: Three times a day (TID) | ORAL | 0 refills | Status: DC | PRN

## 2023-12-25 ENCOUNTER — Encounter: Payer: Self-pay | Admitting: Family Medicine

## 2023-12-31 DIAGNOSIS — M25561 Pain in right knee: Secondary | ICD-10-CM | POA: Diagnosis not present

## 2024-01-01 DIAGNOSIS — G4733 Obstructive sleep apnea (adult) (pediatric): Secondary | ICD-10-CM | POA: Diagnosis not present

## 2024-01-06 DIAGNOSIS — M25561 Pain in right knee: Secondary | ICD-10-CM | POA: Diagnosis not present

## 2024-01-07 ENCOUNTER — Ambulatory Visit (HOSPITAL_COMMUNITY)
Admission: RE | Admit: 2024-01-07 | Discharge: 2024-01-07 | Disposition: A | Discharge status: Home / Self Care | Source: Ambulatory Visit | Attending: Family Medicine | Admitting: Family Medicine

## 2024-01-07 ENCOUNTER — Encounter: Payer: Self-pay | Admitting: Family Medicine

## 2024-01-07 DIAGNOSIS — Z780 Asymptomatic menopausal state: Secondary | ICD-10-CM | POA: Diagnosis not present

## 2024-01-07 DIAGNOSIS — M85851 Other specified disorders of bone density and structure, right thigh: Secondary | ICD-10-CM | POA: Diagnosis not present

## 2024-01-07 DIAGNOSIS — M810 Age-related osteoporosis without current pathological fracture: Secondary | ICD-10-CM | POA: Insufficient documentation

## 2024-01-08 ENCOUNTER — Other Ambulatory Visit: Payer: Self-pay | Admitting: Family Medicine

## 2024-01-08 ENCOUNTER — Other Ambulatory Visit: Payer: Self-pay | Admitting: Cardiology

## 2024-01-08 DIAGNOSIS — M25561 Pain in right knee: Secondary | ICD-10-CM | POA: Diagnosis not present

## 2024-01-13 DIAGNOSIS — M25561 Pain in right knee: Secondary | ICD-10-CM | POA: Diagnosis not present

## 2024-01-15 DIAGNOSIS — M25561 Pain in right knee: Secondary | ICD-10-CM | POA: Diagnosis not present

## 2024-01-21 DIAGNOSIS — M25561 Pain in right knee: Secondary | ICD-10-CM | POA: Diagnosis not present

## 2024-01-23 DIAGNOSIS — M25561 Pain in right knee: Secondary | ICD-10-CM | POA: Diagnosis not present

## 2024-01-24 ENCOUNTER — Other Ambulatory Visit: Payer: Self-pay | Admitting: Cardiology

## 2024-01-24 ENCOUNTER — Telehealth: Payer: Self-pay | Admitting: Cardiology

## 2024-01-24 MED ORDER — ROSUVASTATIN CALCIUM 40 MG PO TABS
40.0000 mg | ORAL_TABLET | Freq: Every day | ORAL | 0 refills | Status: DC
Start: 2024-01-24 — End: 2024-04-07

## 2024-01-24 NOTE — Telephone Encounter (Signed)
Filled until appointment 

## 2024-01-24 NOTE — Telephone Encounter (Signed)
*  STAT* If patient is at the pharmacy, call can be transferred to refill team.   1. Which medications need to be refilled? (please list name of each medication and dose if known)   rosuvastatin  (CRESTOR ) 40 MG tablet   2. Would you like to learn more about the convenience, safety, & potential cost savings by using the [facility]?   3. Are you open to using the [facility] (Type [facility]. ).  4. Which pharmacy/location (including street and city if local pharmacy) is medication to be sent to?  [facility] [phone] - [place], [place] - [address] AT [facility] [address]   5. Do they need a 30 day or 90 day supply?   90 day  Patient stated she still has some medication.  Patient has appointment scheduled with Dr. Londa Rival on 8/27.

## 2024-01-27 DIAGNOSIS — M25561 Pain in right knee: Secondary | ICD-10-CM | POA: Diagnosis not present

## 2024-01-31 DIAGNOSIS — M25561 Pain in right knee: Secondary | ICD-10-CM | POA: Diagnosis not present

## 2024-02-04 DIAGNOSIS — M25561 Pain in right knee: Secondary | ICD-10-CM | POA: Diagnosis not present

## 2024-02-12 ENCOUNTER — Encounter: Payer: Self-pay | Admitting: Family Medicine

## 2024-02-17 LAB — PROTEIN / CREATININE RATIO, URINE: Creatinine, Urine: 16

## 2024-02-29 LAB — MICROALBUMIN / CREATININE URINE RATIO: Microalb Creat Ratio: 16

## 2024-03-03 DIAGNOSIS — N390 Urinary tract infection, site not specified: Secondary | ICD-10-CM | POA: Diagnosis not present

## 2024-03-11 DIAGNOSIS — L821 Other seborrheic keratosis: Secondary | ICD-10-CM | POA: Diagnosis not present

## 2024-03-11 DIAGNOSIS — L500 Allergic urticaria: Secondary | ICD-10-CM | POA: Diagnosis not present

## 2024-03-11 DIAGNOSIS — S70362A Insect bite (nonvenomous), left thigh, initial encounter: Secondary | ICD-10-CM | POA: Diagnosis not present

## 2024-03-11 DIAGNOSIS — D485 Neoplasm of uncertain behavior of skin: Secondary | ICD-10-CM | POA: Diagnosis not present

## 2024-03-11 DIAGNOSIS — C44319 Basal cell carcinoma of skin of other parts of face: Secondary | ICD-10-CM | POA: Diagnosis not present

## 2024-03-11 DIAGNOSIS — L814 Other melanin hyperpigmentation: Secondary | ICD-10-CM | POA: Diagnosis not present

## 2024-03-11 DIAGNOSIS — Z85828 Personal history of other malignant neoplasm of skin: Secondary | ICD-10-CM | POA: Diagnosis not present

## 2024-03-11 DIAGNOSIS — L570 Actinic keratosis: Secondary | ICD-10-CM | POA: Diagnosis not present

## 2024-03-26 ENCOUNTER — Ambulatory Visit: Admitting: Family Medicine

## 2024-04-07 ENCOUNTER — Other Ambulatory Visit: Payer: Self-pay | Admitting: Cardiology

## 2024-04-08 DIAGNOSIS — G4733 Obstructive sleep apnea (adult) (pediatric): Secondary | ICD-10-CM | POA: Diagnosis not present

## 2024-04-13 ENCOUNTER — Encounter: Payer: Self-pay | Admitting: Family Medicine

## 2024-04-13 ENCOUNTER — Ambulatory Visit: Admitting: Family Medicine

## 2024-04-13 VITALS — BP 138/84 | HR 64 | Temp 98.0°F | Ht 66.0 in | Wt 172.0 lb

## 2024-04-13 DIAGNOSIS — Z789 Other specified health status: Secondary | ICD-10-CM | POA: Diagnosis not present

## 2024-04-13 DIAGNOSIS — M810 Age-related osteoporosis without current pathological fracture: Secondary | ICD-10-CM

## 2024-04-13 DIAGNOSIS — I25119 Atherosclerotic heart disease of native coronary artery with unspecified angina pectoris: Secondary | ICD-10-CM

## 2024-04-13 DIAGNOSIS — I10 Essential (primary) hypertension: Secondary | ICD-10-CM | POA: Diagnosis not present

## 2024-04-13 DIAGNOSIS — E785 Hyperlipidemia, unspecified: Secondary | ICD-10-CM | POA: Diagnosis not present

## 2024-04-13 NOTE — Patient Instructions (Addendum)
 Please stop by lab before you go If you have mychart- we will send your results within 3 business days of us  receiving them.  If you do not have mychart- we will call you about results within 5 business days of us  receiving them.  *please also note that you will see labs on mychart as soon as they post. I will later go in and write notes on them- will say notes from Dr. Katrinka   blood pressure last few checks running high acceptable- but prior to that was more low 130s - I aasked her to do some checks over the next week and update me  Recommended follow up: Return for next already scheduled visit or sooner if needed.

## 2024-04-13 NOTE — Progress Notes (Signed)
 Phone [phone] In person visit   Subjective:   Angela [name] is a 78 y.o. year old very pleasant female patient who presents for/with See problem oriented charting Chief Complaint  Patient presents with   discuss vaccines    Wants to see if immune to MMR    Past Medical History-  Patient Active Problem List   Diagnosis Date Noted   Coronary artery disease involving native coronary artery of native heart with angina pectoris (HCC) 07/24/2019    Priority: High   History of breast cancer 03/20/2011    Priority: High   B12 deficiency 04/30/2023    Priority: Medium    Neuropathy 11/10/2019    Priority: Medium    OSA (obstructive sleep apnea) 08/24/2015    Priority: Medium    Hyperglycemia 11/12/2014    Priority: Medium    Osteoporosis-due to prior treatment with Prolia /Fosamax  06/04/2014    Priority: Medium    HTN (hypertension) 07/13/2008    Priority: Medium    Hyperlipidemia 07/09/2007    Priority: Medium    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    Priority: Low   Type 2 macular telangiectasis, 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    Priority: Low   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    Priority: Low   History of skin cancer 02/21/2016    Priority: Low   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 09/14/2015    Priority: Low   Chest discomfort 10/05/2014    Priority: Low   Cataract 07/16/2012    Priority: Low   Pseudophakia of right eye 07/16/2012    Priority: Low   Vitamin D  deficiency 07/09/2007    Priority: Low   Chronic pain of right knee 12/11/2023   Primary osteoarthritis of right knee 12/11/2023   Pseudophakia of left eye 05/15/2022   Left posterior capsular opacification 05/15/2022   History of colon polyps 06/12/2018   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     aspirin  81 MG tablet Take 81 mg by mouth every evening.     Calcium  Carbonate-Vitamin D  600-400 MG-UNIT tablet Take 2 tablets by mouth every evening.      Cholecalciferol (VITAMIN D3) 50 MCG (2000 UT) capsule Take 2,000 Units by mouth every evening.      Coenzyme Q10 (COQ-10) 100 MG CAPS Take 100 mg by mouth daily.      Cyanocobalamin  (VITAMIN B 12 PO) Take by mouth.     fish oil-omega-3 fatty acids 1000 MG capsule Take 1 g by mouth every evening.      hydrochlorothiazide  (HYDRODIURIL ) 25 MG tablet TAKE 1 TABLET(25 MG) BY MOUTH DAILY 90 tablet 3   isosorbide  mononitrate (IMDUR ) 30 MG 24 hr tablet TAKE 1 TABLET(30 MG) BY MOUTH TWICE DAILY 180 tablet 3   metFORMIN  (GLUCOPHAGE ) 500 MG tablet TAKE 1 TABLET(500 MG) BY MOUTH TWICE DAILY WITH A MEAL 180 tablet 3   metoprolol  succinate (TOPROL -XL) 50 MG 24 hr tablet TAKE 1 TABLET BY MOUTH EVERY DAY IMMEDIATELY FOLLOWING A MEAL 90 tablet 3   Multiple Vitamin (MULTIVITAMIN) tablet Take 1 tablet by mouth every evening.     nitroGLYCERIN  (NITROSTAT ) 0.4 MG SL tablet Place 1 tablet (0.4 mg total) under the tongue every 5 (five) minutes x 3 doses as needed for chest pain (if no  relief after 2nd dose, proceed to the ED or call 911). 25 tablet 3   potassium chloride  SA (KLOR-CON  M) 20 MEQ tablet TAKE 1 TABLET BY MOUTH EVERY DAY 90 tablet 3   PSYLLIUM PO Take by mouth.     rosuvastatin  (CRESTOR ) 40 MG tablet TAKE 1 TABLET(40 MG) BY MOUTH DAILY 90 tablet 0   traMADol  (ULTRAM ) 50 MG tablet Take 1 tablet (50 mg total) by mouth every 8 (eight) hours as needed for severe pain (pain score 7-10). 30 tablet 0   vitamin C (ASCORBIC ACID) 500 MG tablet Take 500 mg by mouth every evening.      No current facility-administered medications for this visit.     Objective:  BP 138/84   Pulse 64   Temp 98 F (36.7 C)   Ht 5' 6 (1.676 m)   Wt 172 lb (78 kg)   LMP  (LMP Unknown)   SpO2 96%   BMI 27.76 kg/m  Gen: NAD, resting comfortably CV:  RRR no murmurs rubs or gallops Lungs: CTAB no crackles, wheeze, rhonchi Ext: no edema, left knee in compression sleeve Skin: warm, dry     Assessment and Plan   # Immunization discussion-patient is interested to see if she is still immune to measles, mumps and rubella-will check MMR titer today. Never had vaccination but may have had natural infection.   # Right knee pain-at last visit we referred her to Dr. Joane who diagnosed her with osteoarthritis of the right knee-had steroid injection as well as recommend Voltaren  gel and Tylenol  and given PT referral to [facility] physical therapy and need.  They considered offloader knee brace.  But opted out at that time. Physical therapy has helped some.  -some balance issues and tried walking backwards and feels it helps. Also working on standing without arm assistance.   # Constipation-psyllium remains helpful .   #mild  ALT elevation- check LFTs today   #recent urinary tract infection- treated in [place]  #Coronary artery disease of native artery of native heart with stable angina pectoris -follows with DR. McDowell  #hyperlipidemia S: Medication: aspirin  81 mg, imdur  30mg , nitroglycerin  prn, rosuvastatin  40 mg, fish oil - no chest pain or shortness of breath  Lab Results  Component Value Date   CHOL 108 11/19/2023   HDL 34.40 (L) 11/19/2023   LDLCALC 55 11/19/2023   LDLDIRECT 68.0 08/24/2021   TRIG 92.0 11/19/2023   CHOLHDL 3 11/19/2023   A/P: coronary artery disease asymptomatic continue current medications. Cholesterol has looked great- continue current medications   #hypertension S: medication:  hctz 25mg  (also on potassium), imdur  30mg , metoprolol  50mg  XR BP Readings from Last 3 Encounters:  04/13/24 138/84  12/11/23 138/82  12/10/23 138/78  A/P: blood pressure last few checks running high acceptable- but prior to that was more low 130s - I aasked her to do some checks over the next week and update me  Recommended follow up:  Return for next already scheduled visit or sooner if needed. Future Appointments  Date Time Provider Department Center  04/16/2024  2:00 PM Malachy Comer GAILS, NP [facility]  05/06/2024  3:20 PM Debera Jayson MATSU, MD [facility]  05/26/2024  9:20 AM Katrinka Garnette KIDD, MD [facility]  11/03/2024 10:40 AM [facility] ANNUAL WELLNESS VISIT 1 [facility]    Lab/Order associations:   ICD-10-CM   1. Measles, mumps, rubella (MMR) vaccination status unknown  Z78.9 Measles/Mumps/Rubella Immunity    2. Primary hypertension  I10 Comprehensive  metabolic panel with GFR    3. Coronary artery disease involving native coronary artery of native heart with angina pectoris (HCC)  I25.119     4. Hyperlipidemia, unspecified hyperlipidemia type  E78.5     5. Age-related osteoporosis without current pathological fracture  M81.0       No orders of the defined types were placed in this encounter.   Return precautions advised.  Garnette Lukes, MD

## 2024-04-14 ENCOUNTER — Ambulatory Visit: Payer: Self-pay | Admitting: Family Medicine

## 2024-04-14 DIAGNOSIS — Z85828 Personal history of other malignant neoplasm of skin: Secondary | ICD-10-CM | POA: Diagnosis not present

## 2024-04-14 DIAGNOSIS — C44319 Basal cell carcinoma of skin of other parts of face: Secondary | ICD-10-CM | POA: Diagnosis not present

## 2024-04-14 LAB — COMPREHENSIVE METABOLIC PANEL WITH GFR
ALT: 40 U/L — ABNORMAL HIGH (ref 0–35)
AST: 35 U/L (ref 0–37)
Albumin: 4.6 g/dL (ref 3.5–5.2)
Alkaline Phosphatase: 59 U/L (ref 39–117)
BUN: 12 mg/dL (ref 6–23)
CO2: 29 meq/L (ref 19–32)
Calcium: 10.1 mg/dL (ref 8.4–10.5)
Chloride: 103 meq/L (ref 96–112)
Creatinine, Ser: 0.63 mg/dL (ref 0.40–1.20)
GFR: 85.64 mL/min (ref >60.00)
Glucose, Bld: 89 mg/dL (ref 70–99)
Potassium: 4.2 meq/L (ref 3.5–5.1)
Sodium: 138 meq/L (ref 135–145)
Total Bilirubin: 0.5 mg/dL (ref 0.2–1.2)
Total Protein: 6.9 g/dL (ref 6.0–8.3)

## 2024-04-14 LAB — MEASLES/MUMPS/RUBELLA IMMUNITY
Mumps IgG: >300 [AU]/ml
Rubella: 26.8 {index}
Rubeola IgG: 230 [AU]/ml

## 2024-04-16 ENCOUNTER — Ambulatory Visit (HOSPITAL_BASED_OUTPATIENT_CLINIC_OR_DEPARTMENT_OTHER): Admitting: Nurse Practitioner

## 2024-04-16 ENCOUNTER — Encounter (HOSPITAL_BASED_OUTPATIENT_CLINIC_OR_DEPARTMENT_OTHER): Payer: Self-pay | Admitting: Nurse Practitioner

## 2024-04-16 VITALS — BP 181/92 | HR 69 | Ht 66.0 in | Wt 176.9 lb

## 2024-04-16 DIAGNOSIS — G4733 Obstructive sleep apnea (adult) (pediatric): Secondary | ICD-10-CM

## 2024-04-16 DIAGNOSIS — F5101 Primary insomnia: Secondary | ICD-10-CM

## 2024-04-16 DIAGNOSIS — G4700 Insomnia, unspecified: Secondary | ICD-10-CM | POA: Insufficient documentation

## 2024-04-16 MED ORDER — TRAZODONE HCL 50 MG PO TABS
25.0000 mg | ORAL_TABLET | Freq: Every evening | ORAL | 1 refills | Status: DC | PRN
Start: 2024-04-16 — End: 2024-07-29

## 2024-04-16 NOTE — Progress Notes (Signed)
 @Patient  ID: Angela [name], female    DOB: 01/27/1946, 78 y.o.   MRN: [id number]  Chief Complaint  Patient presents with   Follow-up    OSA    Referring provider: Katrinka Garnette KIDD, MD  HPI: 78 year old female, never smoker followed for OSA on CPAP. She is a former patient of Dr. Magdaleno and last seen in office 04/16/2023. Past medical history significant HTN, CAD, HLD, B12 deficiency.   TEST/EVENTS:  08/15/2015 HST: AHI 52.9/h, SpO2 low 65%, average 88%  04/16/2023: OV with Dr. Shellia. Uses CPAP nightly. No issues with mask fit or pressure. Wakes up a few times to use the bathroom. No sleep aids. Sometimes naps in the afternoon. Will be traveling to [place] in the fall. Compliant with CPAP. Current machine ordered 04/2021  04/16/2024: Today - follow up Discussed the use of AI scribe software for clinical note transcription with the patient, who gave verbal consent to proceed.  History of Present Illness Angela [name] is a 78 year old female with sleep apnea who presents for a follow-up regarding her CPAP therapy and sleep issues.  She has been using a CPAP machine since 2016, which is functioning well without significant issues or leaks. She generally feels well-rested but experiences sleep disturbances, waking up between 2 to 4 AM. She attributes these disturbances to age-related changes rather than her sleep apnea.  She wakes up more frequently to use the bathroom, which may contribute to her sleep disturbances. She has not used any sleep medications since starting CPAP therapy. She experiences periods of poor sleep for three to four nights, followed by a night of good rest, which she attributes to 'catching up' on sleep. She estimates she gets five to six hours of sleep per night, which she feels is insufficient, but she does nap occasionally. Denies any sleep parasomnias/paralysis, drowsy driving.   She was diagnosed with macular telangiectasia and has been monitored for five years by an eye  doctor.    03/18/2024-04/16/2024 CPAP  30/30 days; 100% >4 hr; average use 8 hr 57 min Leak 95th 14 AHI 0.46  Allergies  Allergen Reactions   Lisinopril  Cough   Statins Other (See Comments)    Myalgia with lipitor- unproven She is able to tolerate simvastatin     Immunization History  Administered Date(s) Administered   Fluad Quad(high Dose 65+) 06/12/2019, 05/05/2020, 05/04/2022   Hep A / Hep B 03/16/2013, 10/06/2013   Hepatitis B, ADULT 04/17/2013   Influenza Split 06/09/2012   Influenza Whole 05/22/2011   Influenza, High Dose Seasonal PF 04/20/2023   Influenza,inj,Quad PF,6+ Mos 05/27/2013   Influenza-Unspecified 06/10/2014, 05/24/2015, 04/24/2016, 04/26/2017, 05/11/2018, 06/06/2021   Moderna Covid-19 Fall Seasonal Vaccine 60yrs & older 11/15/2023   Moderna SARS-COV2 Booster Vaccination 12/08/2020, 05/30/2021   Moderna Sars-Covid-2 Vaccination 10/13/2019, 11/12/2019, 07/05/2020, 01/31/2022   PFIZER Comirnaty(Gray Top)Covid-19 Tri-Sucrose Vaccine 05/07/2023   PNEUMOCOCCAL CONJUGATE-20 10/02/2022   Pfizer Covid-19 Vaccine Bivalent Booster 67yrs & up 06/06/2022   Pneumococcal Conjugate-13 11/12/2014   Pneumococcal Polysaccharide-23 08/24/2008, 07/17/2013   Respiratory Syncytial Virus Vaccine,Recomb Aduvanted(Arexvy) 05/04/2022   Td 04/19/2010   Tdap 04/05/2016   Zoster Recombinant(Shingrix) 07/01/2017, 09/20/2017   Zoster, Live 11/17/2008    Past Medical History:  Diagnosis Date   Allergy    seasonal   Arthritis    BCC (basal cell carcinoma of skin) 08/31/1994   RIGHT CLAVICLE INFERIOR   BCC (basal cell carcinoma of skin) 02/21/1994   RIGHT SCAPULA TX CX3 5FU   BCC (  basal cell carcinoma of skin) 02/21/1994   RIGHT CLAVICAL TX CX3 5FU   BCC (basal cell carcinoma of skin) 05/25/1994   RIGHT SCAPULA TX =MOHS   BCC (basal cell carcinoma of skin) 02/02/1999   RIGHT FRONT SHOULDER TX CURET X3 ,EXC   BCC (basal cell carcinoma of skin) 12/25/1999   RIGHT UPPER BACK CX3  5FU   BCC (basal cell carcinoma of skin) 03/29/2005   RIGHT LATER LOWER LEG TX MOHS   BCC (basal cell carcinoma of skin) 11/10/2005   LEFT ANT. NECK TX=MOHS   BCC (basal cell carcinoma of skin) 08/26/2018   RIGHT FRONT SHOULDER TX WITH BX   BCC (basal cell carcinoma) 08/31/1994   RIGHT SUPER CLAVICLE MEDIAL TX CX3 5FU   Breast cancer (HCC) 2009   Left Breast Cancer   CAD (coronary artery disease)    Cataract    Diabetes mellitus without complication (HCC)    Essential hypertension    Hyperlipidemia    Macular retinal cyst of right eye    Nuclear sclerotic cataract of left eye 03/08/2020   The nature of cataract was discussed with the patient as well as the elective nature of surgery. The patient was reassured that surgery at a later date does not put the patient at risk for a worse outcome. It was emphasized that the need for surgery is dictated by the patient's quality of life as influenced by the cataract. Patient was instructed to maintain close follow up with their general eye    OSA (obstructive sleep apnea)    CPAP   Osteopenia    Osteopenia    Personal history of radiation therapy    Sleep apnea     Tobacco History: Social History   Tobacco Use  Smoking Status Never   Passive exposure: Never  Smokeless Tobacco Never   Counseling given: Not Answered   Outpatient Medications Prior to Visit  Medication Sig Dispense Refill   acetaminophen  (TYLENOL ) 500 MG tablet acetaminophen  500 mg tablet  Take 2 tablets every 6 hours by oral route as needed.     aspirin  81 MG tablet Take 81 mg by mouth every evening.     Calcium  Carbonate-Vitamin D  600-400 MG-UNIT tablet Take 2 tablets by mouth every evening.      Cholecalciferol (VITAMIN D3) 50 MCG (2000 UT) capsule Take 2,000 Units by mouth every evening.      Coenzyme Q10 (COQ-10) 100 MG CAPS Take 100 mg by mouth daily.      Cyanocobalamin  (VITAMIN B 12 PO) Take by mouth.     fish oil-omega-3 fatty acids 1000 MG capsule Take 1 g  by mouth every evening.      hydrochlorothiazide  (HYDRODIURIL ) 25 MG tablet TAKE 1 TABLET(25 MG) BY MOUTH DAILY 90 tablet 3   isosorbide  mononitrate (IMDUR ) 30 MG 24 hr tablet TAKE 1 TABLET(30 MG) BY MOUTH TWICE DAILY 180 tablet 3   metFORMIN  (GLUCOPHAGE ) 500 MG tablet TAKE 1 TABLET(500 MG) BY MOUTH TWICE DAILY WITH A MEAL 180 tablet 3   metoprolol  succinate (TOPROL -XL) 50 MG 24 hr tablet TAKE 1 TABLET BY MOUTH EVERY DAY IMMEDIATELY FOLLOWING A MEAL 90 tablet 3   Multiple Vitamin (MULTIVITAMIN) tablet Take 1 tablet by mouth every evening.     nitroGLYCERIN  (NITROSTAT ) 0.4 MG SL tablet Place 1 tablet (0.4 mg total) under the tongue every 5 (five) minutes x 3 doses as needed for chest pain (if no relief after 2nd dose, proceed to the ED or call 911). 25  tablet 3   potassium chloride  SA (KLOR-CON  M) 20 MEQ tablet TAKE 1 TABLET BY MOUTH EVERY DAY 90 tablet 3   PSYLLIUM PO Take by mouth.     rosuvastatin  (CRESTOR ) 40 MG tablet TAKE 1 TABLET(40 MG) BY MOUTH DAILY 90 tablet 0   vitamin C (ASCORBIC ACID) 500 MG tablet Take 500 mg by mouth every evening.      traMADol  (ULTRAM ) 50 MG tablet Take 1 tablet (50 mg total) by mouth every 8 (eight) hours as needed for severe pain (pain score 7-10). 30 tablet 0   No facility-administered medications prior to visit.     Review of Systems:   Constitutional: No weight loss or gain, night sweats, fevers, chills, or lassitude. +fatigue  HEENT: No headaches Resp: No snoring GI:  No heartburn, indigestion  GU: +nocturia  Psych: No depression or anxiety. Mood stable. +sleep disturbance     Physical Exam:  BP (!) 181/92   Pulse 69   Ht 5' 6 (1.676 m)   Wt 176 lb 14.4 oz (80.2 kg)   LMP  (LMP Unknown)   SpO2 96%   BMI 28.55 kg/m   GEN: Pleasant, interactive, well-appearing; in no acute distress HEENT:  Normocephalic and atraumatic. PERRLA. Sclera white. Nasal turbinates pink, moist and patent bilaterally. No rhinorrhea present. Oropharynx pink and  moist, without exudate or edema. No lesions, ulcerations, or postnasal drip.  NECK:  Supple w/ fair ROM.No lymphadenopathy.   CV: RRR, no m/r/g, no peripheral edema. Pulses intact, +2 bilaterally. No cyanosis, pallor or clubbing. PULMONARY:  Unlabored, regular breathing. Clear bilaterally A&P w/o wheezes/rales/rhonchi. No accessory muscle use.  GI: BS present and normoactive. Soft, non-tender to palpation. No organomegaly or masses detected.  MSK: No erythema, warmth or tenderness. Cap refil <2 sec all extrem. Neuro: A/Ox3. No focal deficits noted.   Skin: Warm, no lesions or rashe Psych: Normal affect and behavior. Judgement and thought content appropriate.     Lab Results:  CBC    Component Value Date/Time   WBC 5.8 11/19/2023 1050   RBC 4.47 11/19/2023 1050   HGB 14.5 11/19/2023 1050   HCT 42.6 11/19/2023 1050   PLT 214.0 11/19/2023 1050   MCV 95.2 11/19/2023 1050   MCH 32.2 09/19/2022 1013   MCHC 34.1 11/19/2023 1050   RDW 12.8 11/19/2023 1050   LYMPHSABS 2.0 11/19/2023 1050   MONOABS 0.6 11/19/2023 1050   EOSABS 0.1 11/19/2023 1050   BASOSABS 0.0 11/19/2023 1050    BMET    Component Value Date/Time   NA 138 04/13/2024 1515   NA 139 02/05/2017 0843   K 4.2 04/13/2024 1515   CL 103 04/13/2024 1515   CO2 29 04/13/2024 1515   GLUCOSE 89 04/13/2024 1515   BUN 12 04/13/2024 1515   BUN 17 02/05/2017 0843   CREATININE 0.63 04/13/2024 1515   CREATININE 0.68 08/01/2020 1147   CALCIUM  10.1 04/13/2024 1515   GFRNONAA >60 09/19/2022 1013   GFRNONAA 87 08/01/2020 1147   GFRAA 101 08/01/2020 1147    BNP    Component Value Date/Time   BNP 43.0 08/18/2018 0230     Imaging:  No results found.  Administration History     None           No data to display          No results found for: NITRICOXIDE      Assessment & Plan:   OSA (obstructive sleep apnea) Severe OSA. Excellent compliance and control. No  significant leaks. Sleep disruptions appear  unrelated to OSA/CPAP use. Aware of risks of untreated OSA. Encouraged continued nightly use. Understands proper care/use of device. Safe driving practices.  Patient Instructions  Continue to use CPAP every night, minimum of 4-6 hours a night.  Change equipment as directed. Wash your tubing with warm soap and water daily, hang to dry. Wash humidifier portion weekly. Use bottled, distilled water and change daily Be aware of reduced alertness and do not drive or operate heavy machinery if experiencing this or drowsiness.  Exercise encouraged, as tolerated. Healthy weight management discussed.  Avoid or decrease alcohol consumption and medications that make you more sleepy, if possible. Notify if persistent daytime sleepiness occurs even with consistent use of PAP therapy.  Change CPAP supplies... Every month Mask cushions and/or nasal pillows CPAP machine filters Every 3 months Mask frame (not including the headgear) CPAP tubing Every 6 months Mask headgear Chin strap (if applicable) Humidifier water tub  Start trazodone  1/2 tab to 1 tab At bedtime as needed for sleep. Take immediately before bed. Ensure you have 7-8 hours in the bed after taking. Monitor for any mood changes or changes in sleep habits. Stop and notify immediately if these occur. Do not drive or operate heavy machinery after taking. Do not take with alcohol or other sedating medications. Ensure you apply your CPAP within 5-10 minutes of taking to avoid falling asleep without it. May cause some morning grogginess or vivid dreams.   Follow up in 6 weeks with Katie Marcellis Frampton,NP to see how sleep medicine is working via virtual visit. If symptoms do not improve or worsen, please contact office for sooner follow up or seek emergency care.     Insomnia Sleep maintenance. Will trial her on trazodone  low dose PRN. Side effect profile reviewed. Sleep hygiene reviewed.    Advised if symptoms do not improve or worsen, to please contact  office for sooner follow up or seek emergency care.   I spent 35 minutes of dedicated to the care of this patient on the date of this encounter to include pre-visit review of records, face-to-face time with the patient discussing conditions above, post visit ordering of testing, clinical documentation with the electronic health record, making appropriate referrals as documented, and communicating necessary findings to members of the patients care team.  Comer LULLA Rouleau, NP 04/16/2024  Pt aware and understands NP's role.

## 2024-04-16 NOTE — Assessment & Plan Note (Signed)
 Sleep maintenance. Will trial her on trazodone  low dose PRN. Side effect profile reviewed. Sleep hygiene reviewed.

## 2024-04-16 NOTE — Assessment & Plan Note (Signed)
 Severe OSA. Excellent compliance and control. No significant leaks. Sleep disruptions appear unrelated to OSA/CPAP use. Aware of risks of untreated OSA. Encouraged continued nightly use. Understands proper care/use of device. Safe driving practices.  Patient Instructions  Continue to use CPAP every night, minimum of 4-6 hours a night.  Change equipment as directed. Wash your tubing with warm soap and water daily, hang to dry. Wash humidifier portion weekly. Use bottled, distilled water and change daily Be aware of reduced alertness and do not drive or operate heavy machinery if experiencing this or drowsiness.  Exercise encouraged, as tolerated. Healthy weight management discussed.  Avoid or decrease alcohol consumption and medications that make you more sleepy, if possible. Notify if persistent daytime sleepiness occurs even with consistent use of PAP therapy.  Change CPAP supplies... Every month Mask cushions and/or nasal pillows CPAP machine filters Every 3 months Mask frame (not including the headgear) CPAP tubing Every 6 months Mask headgear Chin strap (if applicable) Humidifier water tub  Start trazodone  1/2 tab to 1 tab At bedtime as needed for sleep. Take immediately before bed. Ensure you have 7-8 hours in the bed after taking. Monitor for any mood changes or changes in sleep habits. Stop and notify immediately if these occur. Do not drive or operate heavy machinery after taking. Do not take with alcohol or other sedating medications. Ensure you apply your CPAP within 5-10 minutes of taking to avoid falling asleep without it. May cause some morning grogginess or vivid dreams.   Follow up in 6 weeks with Katie Devine Dant,NP to see how sleep medicine is working via virtual visit. If symptoms do not improve or worsen, please contact office for sooner follow up or seek emergency care.

## 2024-04-16 NOTE — Patient Instructions (Addendum)
 Continue to use CPAP every night, minimum of 4-6 hours a night.  Change equipment as directed. Wash your tubing with warm soap and water daily, hang to dry. Wash humidifier portion weekly. Use bottled, distilled water and change daily Be aware of reduced alertness and do not drive or operate heavy machinery if experiencing this or drowsiness.  Exercise encouraged, as tolerated. Healthy weight management discussed.  Avoid or decrease alcohol consumption and medications that make you more sleepy, if possible. Notify if persistent daytime sleepiness occurs even with consistent use of PAP therapy.  Change CPAP supplies... Every month Mask cushions and/or nasal pillows CPAP machine filters Every 3 months Mask frame (not including the headgear) CPAP tubing Every 6 months Mask headgear Chin strap (if applicable) Humidifier water tub  Start trazodone  1/2 tab to 1 tab At bedtime as needed for sleep. Take immediately before bed. Ensure you have 7-8 hours in the bed after taking. Monitor for any mood changes or changes in sleep habits. Stop and notify immediately if these occur. Do not drive or operate heavy machinery after taking. Do not take with alcohol or other sedating medications. Ensure you apply your CPAP within 5-10 minutes of taking to avoid falling asleep without it. May cause some morning grogginess or vivid dreams.   Follow up in 6 weeks with Katie Zaiyah Sottile,NP to see how sleep medicine is working via virtual visit. If symptoms do not improve or worsen, please contact office for sooner follow up or seek emergency care.

## 2024-04-19 DIAGNOSIS — R350 Frequency of micturition: Secondary | ICD-10-CM | POA: Diagnosis not present

## 2024-04-19 DIAGNOSIS — N390 Urinary tract infection, site not specified: Secondary | ICD-10-CM | POA: Diagnosis not present

## 2024-04-21 ENCOUNTER — Ambulatory Visit: Payer: Self-pay | Admitting: Family Medicine

## 2024-04-28 DIAGNOSIS — H35343 Macular cyst, hole, or pseudohole, bilateral: Secondary | ICD-10-CM | POA: Diagnosis not present

## 2024-04-28 DIAGNOSIS — H26492 Other secondary cataract, left eye: Secondary | ICD-10-CM | POA: Diagnosis not present

## 2024-04-28 DIAGNOSIS — H35073 Retinal telangiectasis, bilateral: Secondary | ICD-10-CM | POA: Diagnosis not present

## 2024-04-28 DIAGNOSIS — H4321 Crystalline deposits in vitreous body, right eye: Secondary | ICD-10-CM | POA: Diagnosis not present

## 2024-05-05 DIAGNOSIS — R399 Unspecified symptoms and signs involving the genitourinary system: Secondary | ICD-10-CM | POA: Diagnosis not present

## 2024-05-05 DIAGNOSIS — N390 Urinary tract infection, site not specified: Secondary | ICD-10-CM | POA: Diagnosis not present

## 2024-05-05 DIAGNOSIS — R350 Frequency of micturition: Secondary | ICD-10-CM | POA: Diagnosis not present

## 2024-05-06 ENCOUNTER — Ambulatory Visit: Admitting: Cardiology

## 2024-05-06 ENCOUNTER — Ambulatory Visit: Attending: Cardiology | Admitting: Cardiology

## 2024-05-06 ENCOUNTER — Encounter: Payer: Self-pay | Admitting: Cardiology

## 2024-05-06 VITALS — BP 122/72 | HR 68 | Ht 66.0 in | Wt 174.8 lb

## 2024-05-06 DIAGNOSIS — I25119 Atherosclerotic heart disease of native coronary artery with unspecified angina pectoris: Secondary | ICD-10-CM

## 2024-05-06 DIAGNOSIS — R7303 Prediabetes: Secondary | ICD-10-CM | POA: Diagnosis not present

## 2024-05-06 DIAGNOSIS — E782 Mixed hyperlipidemia: Secondary | ICD-10-CM | POA: Diagnosis not present

## 2024-05-06 NOTE — Progress Notes (Signed)
    Cardiology Office Note  Date: 05/06/2024   ID: Angela [name], Angela [name] 02/21/1946, MRN [id number]  History of Present Illness: Angela [name] is a 78 y.o. female last seen in September 2024.  She is here with her husband for a follow-up visit.  She does report intermittent episodes of chest discomfort, also indigestion, difficult for her to distinguish between the two.  She takes antireflux measures and rarely has to use nitroglycerin .  No specific change in stamina.  We discussed her medications.  She reports compliance with current cardiac regimen.  LDL was 55 back in March.  Blood pressure is well-controlled today.  I reviewed her ECG today which shows normal sinus rhythm.  Physical Exam: VS:  BP 122/72 (BP Location: Left Arm)   Pulse 68   Ht 5' 6 (1.676 m)   Wt 174 lb 12.8 oz (79.3 kg)   LMP  (LMP Unknown)   SpO2 94%   BMI 28.21 kg/m , BMI Body mass index is 28.21 kg/m.  Wt Readings from Last 3 Encounters:  05/06/24 174 lb 12.8 oz (79.3 kg)  04/16/24 176 lb 14.4 oz (80.2 kg)  04/13/24 172 lb (78 kg)    General: Patient appears comfortable at rest. HEENT: Conjunctiva and lids normal. Neck: Supple, no elevated JVP or carotid bruits. Lungs: Clear to auscultation, nonlabored breathing at rest. Cardiac: Regular rate and rhythm, no S3 or significant systolic murmur. Extremities: No pitting edema.  ECG:  An ECG dated 05/14/2023 was personally reviewed today and demonstrated:  Sinus rhythm.  Labwork: 11/19/2023: Hemoglobin 14.5; Platelets 214.0 04/13/2024: ALT 40; AST 35; BUN 12; Creatinine, Ser 0.63; Potassium 4.2; Sodium 138     Component Value Date/Time   CHOL 108 11/19/2023 1050   CHOL 169 01/14/2017 0835   TRIG 92.0 11/19/2023 1050   HDL 34.40 (L) 11/19/2023 1050   HDL 34 (L) 01/14/2017 0835   CHOLHDL 3 11/19/2023 1050   VLDL 18.4 11/19/2023 1050   LDLCALC 55 11/19/2023 1050   LDLCALC 90 01/14/2017 0835   LDLDIRECT 68.0 08/24/2021 1419   Other Studies Reviewed  Today:  No interval cardiac testing for review today.  Assessment and Plan:  1.  CAD, mild to moderate by cardiac catheterization in September 2020 with most significant stenoses including 65 to 75% mid LAD and 50 to 60% second obtuse marginal, both managed medically at that time.  LVEF normal range.  We have continued with medical therapy and observation in the absence of accelerating symptoms.  States that she has used nitroglycerin  1 time since last encounter.  ECG is normal today.  Continue aspirin  81 mg daily, Imdur  30 mg daily, Toprol -XL 50 mg daily, Crestor  40 mg daily, and as needed nitroglycerin .   2.  Prediabetes.  Last hemoglobin A1c 6.2%.  She is currently on metformin  per PCP.  Did not switch to SGLT2 inhibitor due to recurrent UTIs.   3.  OSA on CPAP.   4.  Primary hypertension.  Continue HCTZ with potassium supplement.  Disposition:  Follow up 6 months.  Signed, Jayson JUDITHANN Sierras, M.D., F.A.C.C. [facility]

## 2024-05-06 NOTE — Patient Instructions (Addendum)

## 2024-05-16 ENCOUNTER — Encounter: Payer: Self-pay | Admitting: Family Medicine

## 2024-05-18 ENCOUNTER — Other Ambulatory Visit: Payer: Self-pay | Admitting: Family Medicine

## 2024-05-18 MED ORDER — COVID-19 MRNA VACC (MODERNA) 50 MCG/0.5ML IM SUSP: 0.5000 mL | Freq: Once | INTRAMUSCULAR | 0 refills | Status: AC

## 2024-05-26 ENCOUNTER — Ambulatory Visit: Admitting: Family Medicine

## 2024-05-27 ENCOUNTER — Ambulatory Visit: Admitting: Nurse Practitioner

## 2024-06-16 ENCOUNTER — Encounter: Payer: Self-pay | Admitting: Family Medicine

## 2024-06-16 ENCOUNTER — Ambulatory Visit: Admitting: Family Medicine

## 2024-06-16 ENCOUNTER — Ambulatory Visit: Payer: Self-pay | Admitting: Family Medicine

## 2024-06-16 VITALS — BP 102/64 | HR 55 | Temp 98.2°F | Ht 66.0 in | Wt 172.6 lb

## 2024-06-16 DIAGNOSIS — I10 Essential (primary) hypertension: Secondary | ICD-10-CM | POA: Diagnosis not present

## 2024-06-16 DIAGNOSIS — Z131 Encounter for screening for diabetes mellitus: Secondary | ICD-10-CM

## 2024-06-16 DIAGNOSIS — E538 Deficiency of other specified B group vitamins: Secondary | ICD-10-CM | POA: Diagnosis not present

## 2024-06-16 DIAGNOSIS — E559 Vitamin D deficiency, unspecified: Secondary | ICD-10-CM | POA: Diagnosis not present

## 2024-06-16 DIAGNOSIS — E785 Hyperlipidemia, unspecified: Secondary | ICD-10-CM | POA: Diagnosis not present

## 2024-06-16 DIAGNOSIS — R739 Hyperglycemia, unspecified: Secondary | ICD-10-CM | POA: Diagnosis not present

## 2024-06-16 DIAGNOSIS — I25119 Atherosclerotic heart disease of native coronary artery with unspecified angina pectoris: Secondary | ICD-10-CM

## 2024-06-16 LAB — CBC WITH DIFFERENTIAL/PLATELET
Basophils Absolute: 0 K/uL (ref 0.0–0.1)
Basophils Relative: 0.4 % (ref 0.0–3.0)
Eosinophils Absolute: 0.1 K/uL (ref 0.0–0.7)
Eosinophils Relative: 1.9 % (ref 0.0–5.0)
HCT: 40.6 % (ref 36.0–46.0)
Hemoglobin: 13.8 g/dL (ref 12.0–15.0)
Lymphocytes Relative: 29.3 % (ref 12.0–46.0)
Lymphs Abs: 2.2 K/uL (ref 0.7–4.0)
MCHC: 33.9 g/dL (ref 30.0–36.0)
MCV: 93 fl (ref 78.0–100.0)
Monocytes Absolute: 0.7 K/uL (ref 0.1–1.0)
Monocytes Relative: 9.2 % (ref 3.0–12.0)
Neutro Abs: 4.5 K/uL (ref 1.4–7.7)
Neutrophils Relative %: 59.2 % (ref 43.0–77.0)
Platelets: 237 K/uL (ref 150.0–400.0)
RBC: 4.36 Mil/uL (ref 3.87–5.11)
RDW: 12.5 % (ref 11.5–15.5)
WBC: 7.6 K/uL (ref 4.0–10.5)

## 2024-06-16 LAB — COMPREHENSIVE METABOLIC PANEL WITH GFR
ALT: 29 U/L (ref 0–35)
AST: 32 U/L (ref 0–37)
Albumin: 4.6 g/dL (ref 3.5–5.2)
Alkaline Phosphatase: 54 U/L (ref 39–117)
BUN: 14 mg/dL (ref 6–23)
CO2: 31 meq/L (ref 19–32)
Calcium: 10.3 mg/dL (ref 8.4–10.5)
Chloride: 100 meq/L (ref 96–112)
Creatinine, Ser: 0.58 mg/dL (ref 0.40–1.20)
GFR: 87.25 mL/min (ref >60.00)
Glucose, Bld: 99 mg/dL (ref 70–99)
Potassium: 3.9 meq/L (ref 3.5–5.1)
Sodium: 138 meq/L (ref 135–145)
Total Bilirubin: 0.6 mg/dL (ref 0.2–1.2)
Total Protein: 6.9 g/dL (ref 6.0–8.3)

## 2024-06-16 LAB — VITAMIN B12: Vitamin B-12: 838 pg/mL (ref 211–911)

## 2024-06-16 LAB — HEMOGLOBIN A1C: Hgb A1c MFr Bld: 6.4 % (ref 4.6–6.5)

## 2024-06-16 NOTE — Patient Instructions (Addendum)
 Please stop by lab before you go If you have mychart- we will send your results within 3 business days of us  receiving them.  If you do not have mychart- we will call you about results within 5 business days of us  receiving them.  *please also note that you will see labs on mychart as soon as they post. I will later go in and write notes on them- will say notes from Dr. Katrinka   Recommended follow up: Return in about 6 months (around 12/15/2024) for physical or sooner if needed.Schedule b4 you leave.

## 2024-06-16 NOTE — Progress Notes (Signed)
 Phone [phone] In person visit   Subjective:   Angela [name] is a 78 y.o. year old very pleasant female patient who presents for/with See problem oriented charting Chief Complaint  Patient presents with   Hypertension    6 month follow up;    Hyperlipidemia    Past Medical History-  Patient Active Problem List   Diagnosis Date Noted   Coronary artery disease involving native coronary artery of native heart with angina pectoris 07/24/2019    Priority: High   History of breast cancer 03/20/2011    Priority: High   B12 deficiency 04/30/2023    Priority: Medium    Neuropathy 11/10/2019    Priority: Medium    OSA (obstructive sleep apnea) 08/24/2015    Priority: Medium    Hyperglycemia 11/12/2014    Priority: Medium    Osteoporosis-due to prior treatment with Prolia /Fosamax  06/04/2014    Priority: Medium    HTN (hypertension) 07/13/2008    Priority: Medium    Hyperlipidemia 07/09/2007    Priority: Medium    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    Priority: Low   Type 2 macular telangiectasis, 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    Priority: Low   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    Priority: Low   History of skin cancer 02/21/2016    Priority: Low   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 09/14/2015    Priority: Low   Chest discomfort 10/05/2014    Priority: Low   Cataract 07/16/2012    Priority: Low   Pseudophakia of right eye 07/16/2012    Priority: Low   Vitamin D  deficiency 07/09/2007    Priority: Low   Insomnia 04/16/2024   Chronic pain of right knee 12/11/2023   Primary osteoarthritis of right knee 12/11/2023   Pseudophakia of left eye 05/15/2022   Left posterior capsular opacification 05/15/2022   History of colon polyps 06/12/2018   Macular retinal cyst 07/16/2012    Medications- reviewed and updated Current Outpatient Medications  Medication Sig Dispense Refill   acetaminophen  (TYLENOL ) 500 MG tablet acetaminophen  500 mg tablet  Take 2  tablets every 6 hours by oral route as needed.     aspirin  81 MG tablet Take 81 mg by mouth every evening.     Calcium  Carbonate-Vitamin D  600-400 MG-UNIT tablet Take 2 tablets by mouth every evening.      Cholecalciferol (VITAMIN D3) 50 MCG (2000 UT) capsule Take 2,000 Units by mouth every evening.      Coenzyme Q10 (COQ-10) 100 MG CAPS Take 100 mg by mouth daily.      Cyanocobalamin  (VITAMIN B 12 PO) Take by mouth.     fish oil-omega-3 fatty acids 1000 MG capsule Take 1 g by mouth every evening.      hydrochlorothiazide  (HYDRODIURIL ) 25 MG tablet TAKE 1 TABLET(25 MG) BY MOUTH DAILY 90 tablet 3   isosorbide  mononitrate (IMDUR ) 30 MG 24 hr tablet TAKE 1 TABLET(30 MG) BY MOUTH TWICE DAILY 180 tablet 3   metFORMIN  (GLUCOPHAGE ) 500 MG tablet TAKE 1 TABLET(500 MG) BY MOUTH TWICE DAILY WITH A MEAL 180 tablet 3   metoprolol  succinate (TOPROL -XL) 50 MG 24 hr tablet TAKE 1 TABLET BY MOUTH EVERY DAY IMMEDIATELY FOLLOWING A MEAL 90 tablet 3   Multiple Vitamin (MULTIVITAMIN) tablet Take 1 tablet by mouth every evening.     nitroGLYCERIN  (NITROSTAT ) 0.4 MG SL tablet Place 1 tablet (0.4 mg total) under the tongue every 5 (five) minutes x 3 doses as needed for chest  pain (if no relief after 2nd dose, proceed to the ED or call 911). 25 tablet 3   potassium chloride  SA (KLOR-CON  M) 20 MEQ tablet TAKE 1 TABLET BY MOUTH EVERY DAY 90 tablet 3   PSYLLIUM PO Take by mouth.     rosuvastatin  (CRESTOR ) 40 MG tablet TAKE 1 TABLET(40 MG) BY MOUTH DAILY 90 tablet 0   vitamin C (ASCORBIC ACID) 500 MG tablet Take 500 mg by mouth every evening.      traZODone  (DESYREL ) 50 MG tablet Take 0.5-1 tablets (25-50 mg total) by mouth at bedtime as needed for sleep. (Patient not taking: Reported on 06/16/2024) 30 tablet 1   No current facility-administered medications for this visit.     Objective:  BP 102/64 (BP Location: Left Arm, Patient Position: Sitting, Cuff Size: Normal)   Pulse (!) 55   Temp 98.2 F (36.8 C) (Temporal)    Ht 5' 6 (1.676 m)   Wt 172 lb 9.6 oz (78.3 kg)   LMP  (LMP Unknown)   SpO2 94%   BMI 27.86 kg/m  Gen: NAD, resting comfortably CV: mildly bradycardic no murmurs rubs or gallops Lungs: CTAB no crackles, wheeze, rhonchi Ext: no edema Skin: warm, dry     Assessment and Plan   #History of breast cancer-status post lumpectomy and radiation therapy in 2009.  Completed Arimidex December 2014 after initially starting on tamoxifen.  Continues yearly mammograms   #Coronary artery disease of native artery of native heart with stable angina pectoris -follows with DR. McDowell  #hyperlipidemia S: Medication: aspirin  81 mg, imdur  30mg , nitroglycerin  as needed- not needing, rosuvastatin  40 mg, fish oil -Cardiology has been interested in [place] but patient has recurrent UTIs   -no chest pain or shortness of breath  Lab Results  Component Value Date   CHOL 108 11/19/2023   HDL 34.40 (L) 11/19/2023   LDLCALC 55 11/19/2023   LDLDIRECT 68.0 08/24/2021   TRIG 92.0 11/19/2023   CHOLHDL 3 11/19/2023  A/P: CAD asymptomatic - continue current medications Lipids at goal- continue current medications   #hypertension S: medication:  hctz 25mg  (also on potassium), imdur  30mg , metoprolol  50mg  XR Home readings #s: have looked good as well BP Readings from Last 3 Encounters:  06/16/24 102/64  05/06/24 122/72  04/16/24 (!) 181/92   A/P: well controlled continue current medications   # Hyperglycemia/insulin resistance/prediabetes- peak a1c of 6.2 in 2024 S:  Medication: metformin  500mg  BID but cardiology has been interested in her trying Jardiance  Lab Results  Component Value Date   HGBA1C 6.2 11/19/2023   HGBA1C 6.2 04/30/2023   HGBA1C 6.2 10/02/2022   A/P: hopefully stable- update a1c today. Continue current meds for now   #neuropathy- unknown cause- ? Prediabetes vs. B12 (checking 2024) or tsh (check 2024). Never had chemo.  -intentional about exercises to help her standing plus walking  backwards for practice and strengthening. Doing well overall.   #OSA on CPAP- through pulmonary -also trazodone  through pulmonary - but hasn't tried yet. Some nocturia contributes. No burning or daytime frequency above baseline  #B12 taking twice a week for most part- update levels- had been high   Recommended follow up: Return in about 6 months (around 12/15/2024) for physical or sooner if needed.Schedule b4 you leave. Future Appointments  Date Time Provider Department Center  11/03/2024 10:40 AM [facility] ANNUAL WELLNESS VISIT 1 [facility] [place]    Lab/Order associations:   ICD-10-CM   1. Primary hypertension  I10 Comprehensive metabolic panel with GFR  CBC with Differential/Platelet    2. Hyperlipidemia, unspecified hyperlipidemia type  E78.5     3. Coronary artery disease involving native coronary artery of native heart with angina pectoris  I25.119     4. Hyperglycemia  R73.9 HgB A1c    5. B12 deficiency  E53.8 Vitamin B12    6. Vitamin D  deficiency  E55.9     7. Screening for diabetes mellitus  Z13.1 HgB A1c      No orders of the defined types were placed in this encounter.   Return precautions advised.  Garnette Lukes, MD

## 2024-06-23 DIAGNOSIS — H4321 Crystalline deposits in vitreous body, right eye: Secondary | ICD-10-CM | POA: Diagnosis not present

## 2024-06-23 DIAGNOSIS — H04123 Dry eye syndrome of bilateral lacrimal glands: Secondary | ICD-10-CM | POA: Diagnosis not present

## 2024-06-23 DIAGNOSIS — H52203 Unspecified astigmatism, bilateral: Secondary | ICD-10-CM | POA: Diagnosis not present

## 2024-06-23 DIAGNOSIS — E119 Type 2 diabetes mellitus without complications: Secondary | ICD-10-CM | POA: Diagnosis not present

## 2024-06-23 DIAGNOSIS — N390 Urinary tract infection, site not specified: Secondary | ICD-10-CM | POA: Diagnosis not present

## 2024-06-23 DIAGNOSIS — R350 Frequency of micturition: Secondary | ICD-10-CM | POA: Diagnosis not present

## 2024-06-23 DIAGNOSIS — H0100B Unspecified blepharitis left eye, upper and lower eyelids: Secondary | ICD-10-CM | POA: Diagnosis not present

## 2024-06-23 DIAGNOSIS — H0100A Unspecified blepharitis right eye, upper and lower eyelids: Secondary | ICD-10-CM | POA: Diagnosis not present

## 2024-06-23 LAB — OPHTHALMOLOGY REPORT-SCANNED

## 2024-06-24 ENCOUNTER — Encounter: Payer: Self-pay | Admitting: Family Medicine

## 2024-06-30 ENCOUNTER — Encounter: Payer: Self-pay | Admitting: Podiatry

## 2024-06-30 ENCOUNTER — Ambulatory Visit: Admitting: Podiatry

## 2024-06-30 DIAGNOSIS — B351 Tinea unguium: Secondary | ICD-10-CM | POA: Diagnosis not present

## 2024-06-30 DIAGNOSIS — E1142 Type 2 diabetes mellitus with diabetic polyneuropathy: Secondary | ICD-10-CM | POA: Diagnosis not present

## 2024-06-30 DIAGNOSIS — G629 Polyneuropathy, unspecified: Secondary | ICD-10-CM

## 2024-06-30 DIAGNOSIS — M79609 Pain in unspecified limb: Secondary | ICD-10-CM

## 2024-06-30 NOTE — Progress Notes (Signed)
 Subjective:  Patient ID: Angela [name], female    DOB: 1946/05/07,   MRN: [id number]  Chief Complaint  Patient presents with   Diabetes    Cut my toenails.  My right big toenail is ingrown.  It's starting to hurt.  Saw Dr. Garnette Lukes - 06/16/2024; A1c - 6.4    78 y.o. female presents for concern of thickened elongated and painful nails that are difficult to trim. Requesting to have them trimmed today. Relates burning and tingling in their feet. Patient is pre-diabetic and last A1c was  Lab Results  Component Value Date   HGBA1C 6.4 06/16/2024   .   PCP:  Lukes Garnette KIDD, MD    . Denies any other pedal complaints. Denies n/v/f/c.   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 NECK TX=MOHS   BCC (basal cell carcinoma of skin) 08/26/2018   RIGHT FRONT SHOULDER TX WITH BX   BCC (basal cell carcinoma) 08/31/1994   RIGHT SUPER CLAVICLE MEDIAL TX CX3 5FU   Breast cancer (HCC) 2009   Left Breast Cancer   CAD (coronary artery disease)    Cataract    Diabetes mellitus without complication (HCC)    Essential hypertension    Hyperlipidemia    Macular retinal cyst of right eye    Nuclear sclerotic cataract of left eye 03/08/2020   The nature of cataract was discussed with the patient as well as the elective nature of surgery. The patient was reassured  that surgery at a later date does not put the patient at risk for a worse outcome. It was emphasized that the need for surgery is dictated by the patient's quality of life as influenced by the cataract. Patient was instructed to maintain close follow up with their general eye    OSA (obstructive sleep apnea)    CPAP   Osteopenia    Osteopenia    Personal history of radiation therapy    Sleep apnea     Objective:  Physical Exam: Vascular: DP/PT pulses 2/4 bilateral. CFT <3 seconds. Absent hair growth on digits. Edema noted to bilateral lower extremities. Xerosis noted bilaterally.  Skin. No lacerations or abrasions bilateral feet. Nails 1-5 bilateral  are thickened discolored and elongated with subungual debris.  Musculoskeletal: MMT 5/5 bilateral lower extremities in DF, PF, Inversion and Eversion. Deceased ROM in DF of ankle joint.  Neurological: Sensation intact to light touch. Protective sensation intact bilateral.    Assessment:   1. Pain due to onychomycosis of nail   2. Type 2 diabetes mellitus with peripheral neuropathy (HCC)      Plan:  Patient was evaluated and treated and all questions answered. -Discussed and educated patient on  foot care,  especially with  regards to the vascular, neurological and musculoskeletal systems.  -Stressed the importance of good glycemic control and the detriment of not  controlling glucose levels in relation to the foot. -Discussed supportive shoes at all times and checking feet regularly.  -Mechanically debrided all nails 1-5 bilateral using sterile nail nipper and filed with dremel without incident  -Answered all patient questions -Patient to return  in 3 months for at risk foot care -Patient advised to call the office if any problems or questions arise in the meantime.   Asberry Failing, DPM

## 2024-07-01 ENCOUNTER — Encounter: Payer: Self-pay | Admitting: Family Medicine

## 2024-07-14 ENCOUNTER — Other Ambulatory Visit: Payer: Self-pay | Admitting: Cardiology

## 2024-07-27 ENCOUNTER — Encounter: Payer: Self-pay | Admitting: Nurse Practitioner

## 2024-07-27 DIAGNOSIS — G4733 Obstructive sleep apnea (adult) (pediatric): Secondary | ICD-10-CM

## 2024-07-28 NOTE — Telephone Encounter (Signed)
 Katie, Please see patient message regarding Trazodone  and continuing.  Thank you.

## 2024-07-29 MED ORDER — TRAZODONE HCL 50 MG PO TABS: 25.0000 mg | ORAL_TABLET | Freq: Every evening | ORAL | 5 refills | Status: AC | PRN

## 2024-07-29 NOTE — Telephone Encounter (Signed)
 Rx refilled. Needs f/u August 2026. Thanks.

## 2024-08-10 ENCOUNTER — Encounter: Payer: Self-pay | Admitting: Family Medicine

## 2024-08-10 ENCOUNTER — Other Ambulatory Visit (HOSPITAL_COMMUNITY): Payer: Self-pay | Admitting: Family Medicine

## 2024-08-10 DIAGNOSIS — Z1231 Encounter for screening mammogram for malignant neoplasm of breast: Secondary | ICD-10-CM

## 2024-09-29 ENCOUNTER — Other Ambulatory Visit: Payer: Self-pay | Admitting: Family Medicine

## 2024-10-02 ENCOUNTER — Ambulatory Visit (HOSPITAL_COMMUNITY)
Admission: RE | Admit: 2024-10-02 | Discharge: 2024-10-02 | Disposition: A | Source: Ambulatory Visit | Attending: Family Medicine | Admitting: Family Medicine

## 2024-10-02 DIAGNOSIS — Z1231 Encounter for screening mammogram for malignant neoplasm of breast: Secondary | ICD-10-CM | POA: Insufficient documentation

## 2024-10-05 ENCOUNTER — Ambulatory Visit: Admitting: Podiatry

## 2024-11-03 ENCOUNTER — Ambulatory Visit: Payer: Medicare PPO

## 2024-11-04 ENCOUNTER — Ambulatory Visit

## 2024-11-11 ENCOUNTER — Ambulatory Visit

## 2024-11-17 ENCOUNTER — Ambulatory Visit: Admitting: Cardiology

## 2024-12-29 ENCOUNTER — Encounter: Admitting: Family Medicine
